# Patient Record
Sex: Male | Born: 1945 | Race: White | Hispanic: No | Marital: Married | Smoking: Former smoker
Health system: Southern US, Community
[De-identification: ages and names within clinical notes are randomized; demographics above are authoritative.]

## PROBLEM LIST (undated history)

## (undated) DIAGNOSIS — J811 Chronic pulmonary edema: Secondary | ICD-10-CM

## (undated) DIAGNOSIS — K279 Peptic ulcer, site unspecified, unspecified as acute or chronic, without hemorrhage or perforation: Secondary | ICD-10-CM

## (undated) DIAGNOSIS — S069X9A Unspecified intracranial injury with loss of consciousness of unspecified duration, initial encounter: Secondary | ICD-10-CM

## (undated) DIAGNOSIS — R531 Weakness: Secondary | ICD-10-CM

## (undated) DIAGNOSIS — K219 Gastro-esophageal reflux disease without esophagitis: Secondary | ICD-10-CM

## (undated) DIAGNOSIS — S32019A Unspecified fracture of first lumbar vertebra, initial encounter for closed fracture: Secondary | ICD-10-CM

## (undated) DIAGNOSIS — J189 Pneumonia, unspecified organism: Secondary | ICD-10-CM

## (undated) DIAGNOSIS — C801 Malignant (primary) neoplasm, unspecified: Secondary | ICD-10-CM

## (undated) DIAGNOSIS — M549 Dorsalgia, unspecified: Secondary | ICD-10-CM

## (undated) DIAGNOSIS — A4159 Other Gram-negative sepsis: Secondary | ICD-10-CM

## (undated) DIAGNOSIS — S3992XA Unspecified injury of lower back, initial encounter: Secondary | ICD-10-CM

## (undated) DIAGNOSIS — B1920 Unspecified viral hepatitis C without hepatic coma: Secondary | ICD-10-CM

## (undated) DIAGNOSIS — R159 Full incontinence of feces: Secondary | ICD-10-CM

## (undated) DIAGNOSIS — R569 Unspecified convulsions: Secondary | ICD-10-CM

## (undated) DIAGNOSIS — J449 Chronic obstructive pulmonary disease, unspecified: Secondary | ICD-10-CM

## (undated) HISTORY — DX: Malignant (primary) neoplasm, unspecified: C80.1

## (undated) HISTORY — DX: Gastro-esophageal reflux disease without esophagitis: K21.9

## (undated) HISTORY — DX: Unspecified fracture of first lumbar vertebra, initial encounter for closed fracture: S32.019A

## (undated) HISTORY — DX: Chronic pulmonary edema: J81.1

## (undated) HISTORY — DX: Peptic ulcer, site unspecified, unspecified as acute or chronic, without hemorrhage or perforation: K27.9

## (undated) HISTORY — PX: FRACTURE SURGERY: SHX138

## (undated) HISTORY — PX: BACK SURGERY: SHX140

## (undated) HISTORY — PX: BRAIN SURGERY: SHX531

## (undated) HISTORY — DX: Unspecified viral hepatitis C without hepatic coma: B19.20

## (undated) HISTORY — PX: EYE SURGERY: SHX253

## (undated) HISTORY — DX: Weakness: R53.1

---

## 1990-01-09 DIAGNOSIS — S069XAA Unspecified intracranial injury with loss of consciousness status unknown, initial encounter: Secondary | ICD-10-CM

## 1990-01-09 DIAGNOSIS — S069X9A Unspecified intracranial injury with loss of consciousness of unspecified duration, initial encounter: Secondary | ICD-10-CM

## 1990-01-09 HISTORY — DX: Unspecified intracranial injury with loss of consciousness of unspecified duration, initial encounter: S06.9X9A

## 1990-01-09 HISTORY — DX: Unspecified intracranial injury with loss of consciousness status unknown, initial encounter: S06.9XAA

## 1997-08-03 ENCOUNTER — Other Ambulatory Visit: Admission: RE | Admit: 1997-08-03 | Discharge: 1997-08-03 | Payer: Self-pay | Admitting: Gastroenterology

## 1997-08-30 ENCOUNTER — Other Ambulatory Visit: Admission: RE | Admit: 1997-08-30 | Discharge: 1997-08-30 | Payer: Self-pay | Admitting: Gastroenterology

## 1997-09-10 ENCOUNTER — Other Ambulatory Visit: Admission: RE | Admit: 1997-09-10 | Discharge: 1997-09-10 | Payer: Self-pay | Admitting: Gastroenterology

## 1997-09-28 ENCOUNTER — Other Ambulatory Visit: Admission: RE | Admit: 1997-09-28 | Discharge: 1997-09-28 | Payer: Self-pay | Admitting: Gastroenterology

## 1997-10-10 ENCOUNTER — Other Ambulatory Visit: Admission: RE | Admit: 1997-10-10 | Discharge: 1997-10-10 | Payer: Self-pay | Admitting: Gastroenterology

## 1997-11-05 ENCOUNTER — Other Ambulatory Visit: Admission: RE | Admit: 1997-11-05 | Discharge: 1997-11-05 | Payer: Self-pay | Admitting: Internal Medicine

## 1997-11-19 ENCOUNTER — Other Ambulatory Visit: Admission: RE | Admit: 1997-11-19 | Discharge: 1997-11-19 | Payer: Self-pay | Admitting: Gastroenterology

## 1997-11-27 ENCOUNTER — Encounter: Admission: RE | Admit: 1997-11-27 | Discharge: 1997-11-27 | Payer: Self-pay | Admitting: Internal Medicine

## 1997-12-03 ENCOUNTER — Encounter: Admission: RE | Admit: 1997-12-03 | Discharge: 1997-12-03 | Payer: Self-pay | Admitting: Internal Medicine

## 1997-12-17 ENCOUNTER — Emergency Department (HOSPITAL_COMMUNITY): Admission: EM | Admit: 1997-12-17 | Discharge: 1997-12-17 | Payer: Self-pay | Admitting: Emergency Medicine

## 1997-12-19 ENCOUNTER — Encounter: Payer: Self-pay | Admitting: Emergency Medicine

## 1997-12-19 ENCOUNTER — Inpatient Hospital Stay (HOSPITAL_COMMUNITY): Admission: EM | Admit: 1997-12-19 | Discharge: 1997-12-26 | Payer: Self-pay | Admitting: Emergency Medicine

## 1998-02-04 ENCOUNTER — Encounter: Admission: RE | Admit: 1998-02-04 | Discharge: 1998-02-04 | Payer: Self-pay | Admitting: Internal Medicine

## 1998-03-27 ENCOUNTER — Encounter: Admission: RE | Admit: 1998-03-27 | Discharge: 1998-03-27 | Payer: Self-pay | Admitting: Internal Medicine

## 1999-03-12 ENCOUNTER — Encounter: Admission: RE | Admit: 1999-03-12 | Discharge: 1999-03-12 | Payer: Self-pay | Admitting: Internal Medicine

## 1999-03-28 ENCOUNTER — Encounter: Admission: RE | Admit: 1999-03-28 | Discharge: 1999-03-28 | Payer: Self-pay | Admitting: Internal Medicine

## 1999-03-31 ENCOUNTER — Encounter: Admission: RE | Admit: 1999-03-31 | Discharge: 1999-03-31 | Payer: Self-pay | Admitting: Internal Medicine

## 1999-03-31 ENCOUNTER — Ambulatory Visit (HOSPITAL_COMMUNITY): Admission: RE | Admit: 1999-03-31 | Discharge: 1999-03-31 | Payer: Self-pay | Admitting: Internal Medicine

## 1999-03-31 ENCOUNTER — Encounter: Payer: Self-pay | Admitting: Internal Medicine

## 1999-04-10 ENCOUNTER — Encounter: Payer: Self-pay | Admitting: Internal Medicine

## 1999-04-10 ENCOUNTER — Emergency Department (HOSPITAL_COMMUNITY): Admission: EM | Admit: 1999-04-10 | Discharge: 1999-04-10 | Payer: Self-pay | Admitting: Emergency Medicine

## 1999-04-10 ENCOUNTER — Encounter: Payer: Self-pay | Admitting: Emergency Medicine

## 1999-04-14 ENCOUNTER — Encounter: Admission: RE | Admit: 1999-04-14 | Discharge: 1999-04-14 | Payer: Self-pay | Admitting: Internal Medicine

## 1999-04-14 ENCOUNTER — Ambulatory Visit (HOSPITAL_COMMUNITY): Admission: RE | Admit: 1999-04-14 | Discharge: 1999-04-14 | Payer: Self-pay | Admitting: Internal Medicine

## 1999-04-23 ENCOUNTER — Encounter: Admission: RE | Admit: 1999-04-23 | Discharge: 1999-04-23 | Payer: Self-pay | Admitting: Hematology and Oncology

## 1999-07-10 ENCOUNTER — Encounter: Admission: RE | Admit: 1999-07-10 | Discharge: 1999-07-10 | Payer: Self-pay | Admitting: Internal Medicine

## 1999-08-13 ENCOUNTER — Encounter: Payer: Self-pay | Admitting: Internal Medicine

## 1999-08-13 ENCOUNTER — Encounter: Admission: RE | Admit: 1999-08-13 | Discharge: 1999-08-13 | Payer: Self-pay | Admitting: Internal Medicine

## 1999-08-13 ENCOUNTER — Ambulatory Visit (HOSPITAL_COMMUNITY): Admission: RE | Admit: 1999-08-13 | Discharge: 1999-08-13 | Payer: Self-pay | Admitting: Internal Medicine

## 1999-08-20 ENCOUNTER — Encounter: Payer: Self-pay | Admitting: Emergency Medicine

## 1999-08-20 ENCOUNTER — Encounter: Payer: Self-pay | Admitting: Internal Medicine

## 1999-08-20 ENCOUNTER — Encounter (INDEPENDENT_AMBULATORY_CARE_PROVIDER_SITE_OTHER): Payer: Self-pay | Admitting: *Deleted

## 1999-08-20 ENCOUNTER — Inpatient Hospital Stay (HOSPITAL_COMMUNITY): Admission: EM | Admit: 1999-08-20 | Discharge: 1999-09-02 | Payer: Self-pay | Admitting: Emergency Medicine

## 1999-08-21 ENCOUNTER — Encounter: Payer: Self-pay | Admitting: Internal Medicine

## 1999-08-22 ENCOUNTER — Encounter: Payer: Self-pay | Admitting: Internal Medicine

## 1999-08-23 ENCOUNTER — Encounter: Payer: Self-pay | Admitting: Internal Medicine

## 1999-08-25 ENCOUNTER — Encounter: Payer: Self-pay | Admitting: Internal Medicine

## 1999-09-02 ENCOUNTER — Inpatient Hospital Stay (HOSPITAL_COMMUNITY)
Admission: RE | Admit: 1999-09-02 | Discharge: 1999-09-05 | Payer: Self-pay | Admitting: Physical Medicine & Rehabilitation

## 1999-11-28 ENCOUNTER — Encounter: Admission: RE | Admit: 1999-11-28 | Discharge: 1999-11-28 | Payer: Self-pay | Admitting: Internal Medicine

## 1999-12-10 ENCOUNTER — Ambulatory Visit (HOSPITAL_COMMUNITY): Admission: RE | Admit: 1999-12-10 | Discharge: 1999-12-10 | Payer: Self-pay | Admitting: *Deleted

## 2000-02-26 ENCOUNTER — Encounter: Admission: RE | Admit: 2000-02-26 | Discharge: 2000-02-26 | Payer: Self-pay | Admitting: Hematology and Oncology

## 2000-02-27 ENCOUNTER — Ambulatory Visit (HOSPITAL_COMMUNITY): Admission: RE | Admit: 2000-02-27 | Discharge: 2000-02-27 | Payer: Self-pay | Admitting: Internal Medicine

## 2000-02-27 ENCOUNTER — Encounter: Payer: Self-pay | Admitting: Internal Medicine

## 2000-03-09 ENCOUNTER — Encounter: Admission: RE | Admit: 2000-03-09 | Discharge: 2000-03-09 | Payer: Self-pay | Admitting: Hematology and Oncology

## 2000-04-09 ENCOUNTER — Encounter: Admission: RE | Admit: 2000-04-09 | Discharge: 2000-04-09 | Payer: Self-pay | Admitting: Internal Medicine

## 2000-04-14 ENCOUNTER — Ambulatory Visit (HOSPITAL_COMMUNITY): Admission: RE | Admit: 2000-04-14 | Discharge: 2000-04-14 | Payer: Self-pay | Admitting: *Deleted

## 2000-10-15 ENCOUNTER — Encounter: Payer: Self-pay | Admitting: Emergency Medicine

## 2000-10-15 ENCOUNTER — Inpatient Hospital Stay (HOSPITAL_COMMUNITY): Admission: EM | Admit: 2000-10-15 | Discharge: 2000-10-22 | Payer: Self-pay

## 2000-10-15 ENCOUNTER — Emergency Department (HOSPITAL_COMMUNITY): Admission: EM | Admit: 2000-10-15 | Discharge: 2000-10-16 | Payer: Self-pay | Admitting: Emergency Medicine

## 2000-10-16 ENCOUNTER — Encounter: Payer: Self-pay | Admitting: Internal Medicine

## 2000-10-17 ENCOUNTER — Encounter: Payer: Self-pay | Admitting: Internal Medicine

## 2000-10-19 ENCOUNTER — Encounter: Payer: Self-pay | Admitting: Internal Medicine

## 2000-10-25 ENCOUNTER — Inpatient Hospital Stay (HOSPITAL_COMMUNITY): Admission: EM | Admit: 2000-10-25 | Discharge: 2000-10-28 | Payer: Self-pay

## 2000-10-26 ENCOUNTER — Encounter: Payer: Self-pay | Admitting: Internal Medicine

## 2000-12-13 ENCOUNTER — Encounter: Admission: RE | Admit: 2000-12-13 | Discharge: 2000-12-13 | Payer: Self-pay | Admitting: Internal Medicine

## 2001-07-22 ENCOUNTER — Encounter: Admission: RE | Admit: 2001-07-22 | Discharge: 2001-07-22 | Payer: Self-pay | Admitting: Internal Medicine

## 2005-08-26 ENCOUNTER — Encounter: Payer: Self-pay | Admitting: Emergency Medicine

## 2005-10-01 ENCOUNTER — Inpatient Hospital Stay (HOSPITAL_COMMUNITY): Admission: EM | Admit: 2005-10-01 | Discharge: 2005-10-06 | Payer: Self-pay | Admitting: *Deleted

## 2005-10-26 ENCOUNTER — Ambulatory Visit: Payer: Self-pay | Admitting: Internal Medicine

## 2005-11-11 ENCOUNTER — Ambulatory Visit (HOSPITAL_COMMUNITY): Admission: RE | Admit: 2005-11-11 | Discharge: 2005-11-11 | Payer: Self-pay | Admitting: Internal Medicine

## 2005-11-11 ENCOUNTER — Ambulatory Visit: Payer: Self-pay | Admitting: Cardiovascular Disease

## 2005-11-11 ENCOUNTER — Encounter: Payer: Self-pay | Admitting: Cardiovascular Disease

## 2005-11-17 ENCOUNTER — Ambulatory Visit (HOSPITAL_COMMUNITY): Admission: RE | Admit: 2005-11-17 | Discharge: 2005-11-17 | Payer: Self-pay | Admitting: Internal Medicine

## 2005-11-17 ENCOUNTER — Ambulatory Visit: Payer: Self-pay | Admitting: Internal Medicine

## 2005-11-30 ENCOUNTER — Ambulatory Visit: Payer: Self-pay | Admitting: Internal Medicine

## 2005-12-25 ENCOUNTER — Ambulatory Visit (HOSPITAL_COMMUNITY): Admission: RE | Admit: 2005-12-25 | Discharge: 2005-12-25 | Payer: Self-pay | Admitting: Internal Medicine

## 2006-03-10 ENCOUNTER — Inpatient Hospital Stay (HOSPITAL_COMMUNITY): Admission: AD | Admit: 2006-03-10 | Discharge: 2006-03-12 | Payer: Self-pay | Admitting: Gastroenterology

## 2006-06-08 ENCOUNTER — Encounter (INDEPENDENT_AMBULATORY_CARE_PROVIDER_SITE_OTHER): Payer: Self-pay | Admitting: *Deleted

## 2006-06-14 ENCOUNTER — Encounter (INDEPENDENT_AMBULATORY_CARE_PROVIDER_SITE_OTHER): Payer: Self-pay | Admitting: *Deleted

## 2006-06-17 ENCOUNTER — Telehealth: Payer: Self-pay | Admitting: *Deleted

## 2006-06-21 ENCOUNTER — Telehealth: Payer: Self-pay | Admitting: *Deleted

## 2006-06-28 ENCOUNTER — Ambulatory Visit: Payer: Self-pay | Admitting: Internal Medicine

## 2006-08-06 ENCOUNTER — Encounter (INDEPENDENT_AMBULATORY_CARE_PROVIDER_SITE_OTHER): Payer: Self-pay | Admitting: Internal Medicine

## 2006-08-10 ENCOUNTER — Ambulatory Visit: Payer: Self-pay | Admitting: Hospitalist

## 2006-08-10 ENCOUNTER — Telehealth: Payer: Self-pay | Admitting: *Deleted

## 2006-08-10 DIAGNOSIS — B1710 Acute hepatitis C without hepatic coma: Secondary | ICD-10-CM | POA: Insufficient documentation

## 2006-08-10 DIAGNOSIS — K279 Peptic ulcer, site unspecified, unspecified as acute or chronic, without hemorrhage or perforation: Secondary | ICD-10-CM | POA: Insufficient documentation

## 2006-08-10 DIAGNOSIS — R32 Unspecified urinary incontinence: Secondary | ICD-10-CM | POA: Insufficient documentation

## 2006-10-21 ENCOUNTER — Encounter (INDEPENDENT_AMBULATORY_CARE_PROVIDER_SITE_OTHER): Payer: Self-pay | Admitting: *Deleted

## 2006-11-03 ENCOUNTER — Encounter (INDEPENDENT_AMBULATORY_CARE_PROVIDER_SITE_OTHER): Payer: Self-pay | Admitting: *Deleted

## 2006-11-15 ENCOUNTER — Ambulatory Visit: Payer: Self-pay | Admitting: *Deleted

## 2006-11-26 ENCOUNTER — Ambulatory Visit: Payer: Self-pay | Admitting: *Deleted

## 2006-11-26 ENCOUNTER — Encounter (INDEPENDENT_AMBULATORY_CARE_PROVIDER_SITE_OTHER): Payer: Self-pay | Admitting: Internal Medicine

## 2006-11-26 DIAGNOSIS — H9190 Unspecified hearing loss, unspecified ear: Secondary | ICD-10-CM | POA: Insufficient documentation

## 2006-11-26 DIAGNOSIS — R4701 Aphasia: Secondary | ICD-10-CM | POA: Insufficient documentation

## 2006-11-26 LAB — CONVERTED CEMR LAB
AST: 27 units/L (ref 0–37)
Albumin: 4.3 g/dL (ref 3.5–5.2)
BUN: 18 mg/dL (ref 6–23)
CO2: 24 meq/L (ref 19–32)
Calcium: 9.3 mg/dL (ref 8.4–10.5)
Chloride: 109 meq/L (ref 96–112)
Creatinine, Ser: 0.96 mg/dL (ref 0.40–1.50)
Glucose, Bld: 97 mg/dL (ref 70–99)
Potassium: 4.6 meq/L (ref 3.5–5.3)

## 2006-12-08 ENCOUNTER — Encounter (INDEPENDENT_AMBULATORY_CARE_PROVIDER_SITE_OTHER): Payer: Self-pay | Admitting: *Deleted

## 2006-12-08 ENCOUNTER — Ambulatory Visit: Payer: Self-pay | Admitting: Vascular Surgery

## 2006-12-08 ENCOUNTER — Ambulatory Visit: Payer: Self-pay | Admitting: Cardiology

## 2006-12-08 ENCOUNTER — Ambulatory Visit (HOSPITAL_COMMUNITY): Admission: RE | Admit: 2006-12-08 | Discharge: 2006-12-08 | Payer: Self-pay | Admitting: *Deleted

## 2006-12-15 ENCOUNTER — Telehealth (INDEPENDENT_AMBULATORY_CARE_PROVIDER_SITE_OTHER): Payer: Self-pay | Admitting: Internal Medicine

## 2006-12-20 ENCOUNTER — Telehealth (INDEPENDENT_AMBULATORY_CARE_PROVIDER_SITE_OTHER): Payer: Self-pay | Admitting: Internal Medicine

## 2006-12-22 ENCOUNTER — Encounter (INDEPENDENT_AMBULATORY_CARE_PROVIDER_SITE_OTHER): Payer: Self-pay | Admitting: *Deleted

## 2006-12-30 ENCOUNTER — Telehealth: Payer: Self-pay | Admitting: *Deleted

## 2006-12-31 ENCOUNTER — Observation Stay (HOSPITAL_COMMUNITY): Admission: EM | Admit: 2006-12-31 | Discharge: 2007-01-07 | Payer: Self-pay | Admitting: Infectious Diseases

## 2006-12-31 ENCOUNTER — Encounter: Payer: Self-pay | Admitting: Emergency Medicine

## 2006-12-31 ENCOUNTER — Ambulatory Visit: Payer: Self-pay | Admitting: Infectious Diseases

## 2006-12-31 ENCOUNTER — Encounter (INDEPENDENT_AMBULATORY_CARE_PROVIDER_SITE_OTHER): Payer: Self-pay | Admitting: *Deleted

## 2007-01-05 ENCOUNTER — Encounter (INDEPENDENT_AMBULATORY_CARE_PROVIDER_SITE_OTHER): Payer: Self-pay | Admitting: *Deleted

## 2007-01-11 ENCOUNTER — Telehealth (INDEPENDENT_AMBULATORY_CARE_PROVIDER_SITE_OTHER): Payer: Self-pay | Admitting: *Deleted

## 2007-03-07 ENCOUNTER — Ambulatory Visit (HOSPITAL_COMMUNITY): Admission: RE | Admit: 2007-03-07 | Discharge: 2007-03-07 | Payer: Self-pay | Admitting: Internal Medicine

## 2008-11-09 ENCOUNTER — Ambulatory Visit: Payer: Self-pay | Admitting: Internal Medicine

## 2008-11-09 ENCOUNTER — Encounter (INDEPENDENT_AMBULATORY_CARE_PROVIDER_SITE_OTHER): Payer: Self-pay | Admitting: Internal Medicine

## 2008-11-09 LAB — CONVERTED CEMR LAB
ALT: 15 units/L (ref 0–53)
AST: 26 units/L (ref 0–37)
CO2: 25 meq/L (ref 19–32)
Calcium: 9.4 mg/dL (ref 8.4–10.5)
Chloride: 110 meq/L (ref 96–112)
Cholesterol: 171 mg/dL (ref 0–200)
PSA: 0.29 ng/mL (ref 0.10–4.00)
Potassium: 4.6 meq/L (ref 3.5–5.3)
Sodium: 145 meq/L (ref 135–145)
Total CHOL/HDL Ratio: 5.5
Total Protein: 7.3 g/dL (ref 6.0–8.3)

## 2008-11-12 ENCOUNTER — Encounter (INDEPENDENT_AMBULATORY_CARE_PROVIDER_SITE_OTHER): Payer: Self-pay | Admitting: Internal Medicine

## 2009-06-17 ENCOUNTER — Ambulatory Visit: Payer: Self-pay | Admitting: Internal Medicine

## 2009-06-17 DIAGNOSIS — Z87820 Personal history of traumatic brain injury: Secondary | ICD-10-CM | POA: Insufficient documentation

## 2009-07-11 ENCOUNTER — Encounter (INDEPENDENT_AMBULATORY_CARE_PROVIDER_SITE_OTHER): Payer: Self-pay | Admitting: Internal Medicine

## 2009-08-07 ENCOUNTER — Telehealth (INDEPENDENT_AMBULATORY_CARE_PROVIDER_SITE_OTHER): Payer: Self-pay | Admitting: Internal Medicine

## 2009-08-09 ENCOUNTER — Telehealth (INDEPENDENT_AMBULATORY_CARE_PROVIDER_SITE_OTHER): Payer: Self-pay | Admitting: Internal Medicine

## 2009-09-26 ENCOUNTER — Telehealth (INDEPENDENT_AMBULATORY_CARE_PROVIDER_SITE_OTHER): Payer: Self-pay | Admitting: Internal Medicine

## 2009-10-18 ENCOUNTER — Ambulatory Visit: Payer: Self-pay | Admitting: Internal Medicine

## 2009-10-18 ENCOUNTER — Encounter: Payer: Self-pay | Admitting: Internal Medicine

## 2009-10-18 ENCOUNTER — Inpatient Hospital Stay (HOSPITAL_COMMUNITY): Admission: EM | Admit: 2009-10-18 | Discharge: 2009-11-04 | Payer: Self-pay | Admitting: Emergency Medicine

## 2009-10-19 ENCOUNTER — Encounter (INDEPENDENT_AMBULATORY_CARE_PROVIDER_SITE_OTHER): Payer: Self-pay | Admitting: Emergency Medicine

## 2009-10-19 ENCOUNTER — Encounter: Payer: Self-pay | Admitting: Internal Medicine

## 2009-10-19 DIAGNOSIS — I498 Other specified cardiac arrhythmias: Secondary | ICD-10-CM | POA: Insufficient documentation

## 2009-10-24 ENCOUNTER — Ambulatory Visit: Payer: Self-pay | Admitting: Physical Medicine & Rehabilitation

## 2009-11-13 ENCOUNTER — Ambulatory Visit: Payer: Self-pay | Admitting: Internal Medicine

## 2009-11-13 DIAGNOSIS — F322 Major depressive disorder, single episode, severe without psychotic features: Secondary | ICD-10-CM | POA: Insufficient documentation

## 2009-12-23 ENCOUNTER — Ambulatory Visit: Payer: Self-pay | Admitting: Internal Medicine

## 2009-12-23 LAB — CONVERTED CEMR LAB
ALT: 19 units/L (ref 0–53)
AST: 36 units/L (ref 0–37)
CO2: 26 meq/L (ref 19–32)
Calcium: 9.3 mg/dL (ref 8.4–10.5)
Chloride: 104 meq/L (ref 96–112)
Creatinine, Ser: 0.85 mg/dL (ref 0.40–1.50)
Sodium: 140 meq/L (ref 135–145)
Total Bilirubin: 0.6 mg/dL (ref 0.3–1.2)
Total Protein: 7.2 g/dL (ref 6.0–8.3)

## 2010-04-07 ENCOUNTER — Ambulatory Visit: Payer: Self-pay | Admitting: Internal Medicine

## 2010-04-07 DIAGNOSIS — E785 Hyperlipidemia, unspecified: Secondary | ICD-10-CM | POA: Insufficient documentation

## 2010-04-07 LAB — CONVERTED CEMR LAB

## 2010-04-08 ENCOUNTER — Encounter: Payer: Self-pay | Admitting: Internal Medicine
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
## 2010-05-27 NOTE — Progress Notes (Signed)
Summary: Refill/gh  Phone Note Refill Request Message from:  Fax from Pharmacy on August 07, 2009 10:34 AM  Refills Requested: Medication #1:  CYMBALTA 30 MG  CPEP Take 1 capsule by mouth once daily.  Medication #2:  PRILOSEC 20 MG CPDR Take 1 tablet by mouth once a day  Medication #3:  TRAMADOL HCL 50 MG TABS 1 tab every 4 hrs as needed for pain  Medication #4:  DESONIDE 0.05 %  CREA Apply to affected area every night at bedtime. Wants Nexium if possible   Method Requested: Electronic Initial call taken by: Angelina Ok RN,  August 07, 2009 10:34 AM    New/Updated Medications: NEXIUM 20 MG CPDR (ESOMEPRAZOLE MAGNESIUM) Take 1 tablet by mouth once a day Prescriptions: DESONIDE 0.05 %  CREA (DESONIDE) Apply to affected area every night at bedtime.  #1 x 3   Entered and Authorized by:   Zara Council MD   Signed by:   Zara Council MD on 08/07/2009   Method used:   Electronically to        [facility]. [phone]* (retail)       [address]       [place], [place]  [address]       Ph: [phone]       Fax: [phone]   RxID:   [id number] CYMBALTA 30 MG  CPEP (DULOXETINE HCL) Take 1 capsule by mouth once daily.  #30 x 5   Entered and Authorized by:   Zara Council MD   Signed by:   Zara Council MD on 08/07/2009   Method used:   Electronically to        [facility]. [phone]* (retail)       [address]       [place], [place]  [address]       Ph: [phone]       Fax: [phone]   RxID:   [id number] TRAMADOL HCL 50 MG TABS (TRAMADOL HCL) 1 tab every 4 hrs as needed for pain  #120 x 1   Entered and Authorized by:   Zara Council MD   Signed by:   Zara Council MD on 08/07/2009   Method used:   Electronically to        [facility]. [phone]* (retail)       [address]       [place], [place]  [address]       Ph: [phone]       Fax:  [phone]   RxID:   [id number] NEXIUM 20 MG CPDR (ESOMEPRAZOLE MAGNESIUM) Take 1 tablet by mouth once a day  #30 x 5   Entered and Authorized by:   Zara Council MD   Signed by:   Zara Council MD on 08/07/2009   Method used:   Electronically to        [facility]. [phone]* (retail)       [address]       [place], [place]  [address]       Ph: [phone]       Fax: [phone]   RxID:   [id number]

## 2010-05-27 NOTE — Progress Notes (Signed)
Summary: Supplies  Phone Note Call from Patient   Caller: Spouse Call For: Todd Council MD Summary of Call: Pt needs prescription for incontinence supplies.  120 pull-ups and 120 diapers.  Wants the prescription mailed to her home if possible. Angelina Ok RN  September 26, 2009 1:43 PM  Initial call taken by: Angelina Ok RN,  September 26, 2009 1:43 PM    New/Updated Medications: * PULL-UPS Please provide 120 pull-ups for patient's incontinence. * DIAPER Please provide 120 diaper for use for patient's incontinence. Prescriptions: DIAPER Please provide 120 diaper for use for patient's incontinence.  #120 x 3   Entered and Authorized by:   Todd Council MD   Signed by:   Todd Council MD on 10/01/2009   Method used:   Telephoned to ...       [facility]. [phone]* (retail)       [address]       [place], [place]  [address]       Ph: [phone]       Fax: [phone]   RxID:   [id number] PULL-UPS Please provide 120 pull-ups for patient's incontinence.  #120 x 3   Entered and Authorized by:   Todd Council MD   Signed by:   Todd Council MD on 10/01/2009   Method used:   Telephoned to ...       [facility]. [phone]* (retail)       [address]       [place], [place]  [address]       Ph: [phone]       Fax: [phone]   RxID:   [id number]   Appended Document: Supplies Prrescription for was faxed to pt'snhome address per Sherry's request.  Angelina Ok, RN 10/04/2009.

## 2010-05-27 NOTE — Assessment & Plan Note (Signed)
Summary: ACUTE-COUGHING/UNABLE TO CLEAR/ADD PER GLADYS/CFB(SILWAL)   Vital Signs:  Patient profile:   65 year old male Height:      72 inches Weight:      214.2 pounds BMI:     29.16 Temp:     96.7 degrees F oral Pulse rate:   90 / minute BP sitting:   128 / 86  (right arm)  Vitals Entered By: Filomena Jungling NT II (June 17, 2009 1:39 PM) CC: conjestion  Is Patient Diabetic? No Pain Assessment Patient in pain? no       Does patient need assistance? Ambulation Wheelchair Comments lives in assistant   Primary Care Provider:  Zara Council MD  CC:  conjestion .  History of Present Illness: 65 y/o man living in ALF with PMH as per the EMR comes to the clinic feeling congested and having cough since last 5 days, He denies fevers, chills or SOB. He has been coughing most of the day, is not able to bring out the cough but feels he has some congestion in his throat and face. He is also having pain while swallowing. No sick contacts or rash or mailaise.    Preventive Screening-Counseling & Management  Alcohol-Tobacco     Smoking Status: quit     Year Quit: 1991  Caffeine-Diet-Exercise     Does Patient Exercise: no  Current Medications (verified): 1)  Diazepam 10 Mg Tabs (Diazepam) .... Take 1 Tablet By Mouth Bedtime 2)  Advair Diskus 250-50 Mcg/dose Misc (Fluticasone-Salmeterol) .... One Puff Twice Daily 3)  Prilosec 20 Mg Cpdr (Omeprazole) .... Take 1 Tablet By Mouth Once A Day 4)  Anacin 81 Mg  Tbec (Aspirin) .... Take 1 Tablet By Mouth Once A Day 5)  Cymbalta 30 Mg  Cpep (Duloxetine Hcl) .... Take 1 Capsule By Mouth Once Daily. 6)  Mucinex Dm 30-600 Mg  Tb12 (Dextromethorphan-Guaifenesin) .... Take 1 Tablet By Mouth Every 12 Hours. 7)  Desonide 0.05 %  Crea (Desonide) .... Apply To Affected Area Every Night At Bedtime. 8)  Colace 100 Mg Caps (Docusate Sodium) .... Take 1 Tablet By Mouth Two Times A Day 9)  Vicodin 5-500 Mg Tabs (Hydrocodone-Acetaminophen) .... Per  Nursing Home Physician 10)  Imitrex 50 Mg Tabs (Sumatriptan Succinate) .... Take One Tablet With Headache, If Using More Than Once Weekly Contact Doctor. 11)  Dandruff Shampoo 1 % Lotn (Selenium Sulfide) .... Use Whenever You Take Shower.  Allergies (verified): 1)  ! Codeine  Review of Systems       The patient complains of hoarseness and prolonged cough.  The patient denies anorexia, fever, weight loss, weight gain, vision loss, decreased hearing, chest pain, syncope, dyspnea on exertion, peripheral edema, headaches, hemoptysis, abdominal pain, melena, hematochezia, severe indigestion/heartburn, hematuria, incontinence, genital sores, muscle weakness, suspicious skin lesions, transient blindness, difficulty walking, depression, unusual weight change, abnormal bleeding, enlarged lymph nodes, angioedema, breast masses, and testicular masses.    Physical Exam  General:  alert, well-developed, well-nourished, and well-hydrated.   Head:  normocephalic and atraumatic.   Ears:  R ear normal and L ear normal.   Nose:  no external deformity.   Lungs:  normal respiratory effort and coarse breath sounds b/lnormal respiratory effort, normal breath sounds, no dullness, and no wheezes.   Heart:  normal rate, regular rhythm, and no JVD.   Abdomen:  soft, non-tender, normal bowel sounds, and no masses.   Pulses:  dorsalis pedis pulses normal bilaterally  Extremities:  no edema Neurologic:  OrientedX3,  cranial nerver 2-12 intact,strength good in all extremities, sensations normal to light touch, reflexes 2+ b/l, gait normal    Impression & Recommendations:  Problem # 1:  ACUTE BRONCHITIS (ICD-466.0) Will give z-pack. will ask him to take mucinex. Paitient's wife wants symbicort instead of advair. His updated medication list for this problem includes:    Symbicort 80-4.5 Mcg/act Aero (Budesonide-formoterol fumarate) .Marland Kitchen... 2 pufs twice daily    Mucinex Dm 30-600 Mg Tb12 (Dextromethorphan-guaifenesin)  .Marland Kitchen... Take 1 tablet by mouth every 12 hours.    Azithromycin 250 Mg Tabs (Azithromycin) .Marland Kitchen... 2 tabs today. then 1 tab a day for next 3 days  Problem # 2:  HEADACHE, TENSION (ICD-307.81) Patient'swife wants to discontinue vicodin for her husband as he gets consitpated with vicodin. Will not refill vicodin. Will ask it to be given every 12 hrs instead of current q4h. Will discontinue from medication list at next visit if he is not getting it anymore. Also, will add ultram for pain control  His updated medication list for this problem includes:    Anacin 81 Mg Tbec (Aspirin) .Marland Kitchen... Take 1 tablet by mouth once a day    Vicodin 5-500 Mg Tabs (Hydrocodone-acetaminophen) .Marland Kitchen... 1 tab every 12 hrs as needed for pain    Imitrex 50 Mg Tabs (Sumatriptan succinate) .Marland Kitchen... Take one tablet with headache, if using more than once weekly contact doctor.    Tramadol Hcl 50 Mg Tabs (Tramadol hcl) .Marland Kitchen... 1 tab every 4 hrs as needed for pain  Complete Medication List: 1)  Diazepam 10 Mg Tabs (Diazepam) .... Take 1 tablet by mouth bedtime 2)  Symbicort 80-4.5 Mcg/act Aero (Budesonide-formoterol fumarate) .... 2 pufs twice daily 3)  Prilosec 20 Mg Cpdr (Omeprazole) .... Take 1 tablet by mouth once a day 4)  Anacin 81 Mg Tbec (Aspirin) .... Take 1 tablet by mouth once a day 5)  Cymbalta 30 Mg Cpep (Duloxetine hcl) .... Take 1 capsule by mouth once daily. 6)  Mucinex Dm 30-600 Mg Tb12 (Dextromethorphan-guaifenesin) .... Take 1 tablet by mouth every 12 hours. 7)  Desonide 0.05 % Crea (Desonide) .... Apply to affected area every night at bedtime. 8)  Colace 100 Mg Caps (Docusate sodium) .... Take 1 tablet by mouth two times a day 9)  Vicodin 5-500 Mg Tabs (Hydrocodone-acetaminophen) .Marland Kitchen.. 1 tab every 12 hrs as needed for pain 10)  Imitrex 50 Mg Tabs (Sumatriptan succinate) .... Take one tablet with headache, if using more than once weekly contact doctor. 11)  Dandruff Shampoo 1 % Lotn (Selenium sulfide) .... Use whenever you  take shower. 12)  Tramadol Hcl 50 Mg Tabs (Tramadol hcl) .Marland Kitchen.. 1 tab every 4 hrs as needed for pain 13)  Azithromycin 250 Mg Tabs (Azithromycin) .... 2 tabs today. then 1 tab a day for next 3 days  Patient Instructions: 1)  Follow up appointment as needed 2)  Get plenty of rest, drink lots of clear liquids, and use Return in 7-10 days if you're not better:sooner if you're feeling worse. Prescriptions: DESONIDE 0.05 %  CREA (DESONIDE) Apply to affected area every night at bedtime.  #1 x 3   Entered and Authorized by:   Bethel Born MD   Signed by:   Bethel Born MD on 06/17/2009   Method used:   Electronically to        [facility]. [phone]* (retail)       [address]       [facility]  [place], [place]  [address]       Ph: [phone]       Fax: [phone]   RxID:   [id number] AZITHROMYCIN 250 MG TABS (AZITHROMYCIN) 2 tabs today. Then 1 tab a day for next 3 days  #5 x 0   Entered and Authorized by:   Bethel Born MD   Signed by:   Bethel Born MD on 06/17/2009   Method used:   Electronically to        [facility]. [phone]* (retail)       [address]       [place], [place]  [address]       Ph: [phone]       Fax: [phone]   RxID:   [id number] SYMBICORT 80-4.5 MCG/ACT AERO (BUDESONIDE-FORMOTEROL FUMARATE) 2 pufs twice daily  #1 x 3   Entered and Authorized by:   Bethel Born MD   Signed by:   Bethel Born MD on 06/17/2009   Method used:   Electronically to        [facility]. [phone]* (retail)       [address]       [place], [place]  [address]       Ph: [phone]       Fax: [phone]   RxID:   [id number] TRAMADOL HCL 50 MG TABS (TRAMADOL HCL) 1 tab every 4 hrs as needed for pain  #120 x 1   Entered and Authorized by:   Bethel Born MD   Signed by:   Bethel Born MD on 06/17/2009   Method used:   Electronically to        [facility]. [phone]* (retail)       [address]       [place], [place]  [address]       Ph: [phone]       Fax: [phone]   RxID:   [id number]     Prevention & Chronic Care Immunizations   Influenza vaccine: Not documented   Influenza vaccine due: 12/27/2010    Tetanus booster: Not documented   Tetanus booster due: 06/18/2019    Pneumococcal vaccine: Not documented   Pneumococcal vaccine due: 06/17/2014    H. zoster vaccine: Not documented  Colorectal Screening   Hemoccult: Not documented   Hemoccult action/deferral: Deferred  (06/17/2009)    Colonoscopy: Not documented  Other Screening   PSA: 0.29  (11/09/2008)   Smoking status: quit  (06/17/2009)  Lipids   Total Cholesterol: 171  (11/09/2008)   LDL: 127  (11/09/2008)   LDL Direct: Not documented   HDL: 31  (11/09/2008)   Triglycerides: 67  (11/09/2008)
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
## 2010-05-27 NOTE — Progress Notes (Signed)
Summary: REfill/gh  Phone Note Refill Request Message from:  Patient on August 07, 2009 10:37 AM  Refills Requested: Medication #1:  Ventolin inhaler  Medication #2:  teridex/ 0.12% for his gums  Method Requested: Electronic Initial call taken by: Angelina Ok RN,  August 07, 2009 10:39 AM    New/Updated Medications: VENTOLIN HFA 108 (90 BASE) MCG/ACT AERS (ALBUTEROL SULFATE) 2 puff as needed for shortness of breath every 4 hours. PERIDEX 0.12 % SOLN (CHLORHEXIDINE GLUCONATE) Swish and spit 3 spoon (15ml) twice a day for one week. Prescriptions: PERIDEX 0.12 % SOLN (CHLORHEXIDINE GLUCONATE) Swish and spit 3 spoon (15ml) twice a day for one week.  #1 x 1   Entered and Authorized by:   Zara Council MD   Signed by:   Zara Council MD on 08/07/2009   Method used:   Electronically to        [facility]. [phone]* (retail)       [address]       [place], [place]  [address]       Ph: [phone]       Fax: [phone]   RxID:   [id number] VENTOLIN HFA 108 (90 BASE) MCG/ACT AERS (ALBUTEROL SULFATE) 2 puff as needed for shortness of breath every 4 hours.  #1 x 5   Entered and Authorized by:   Zara Council MD   Signed by:   Zara Council MD on 08/07/2009   Method used:   Electronically to        [facility]. [phone]* (retail)       [address]       [place], [place]  [address]       Ph: [phone]       Fax: [phone]   RxID:   [id number]

## 2010-05-27 NOTE — Progress Notes (Signed)
Summary: Medication  Phone Note Refill Request Message from:  Patient on August 09, 2009 3:21 PM  Refills Requested: Medication #1:  CYMBALTA 30 MG  CPEP Take 1 capsule by mouth once daily. Pt is taking Cynbalta 30 mg twice a day.  Pt's wife says that he has been taking the 30 mg twice  day.  Wants to know if he can take 60 mg 1 time a day .  Wife says that he has always been 60 mg  even at the Nursing.home.  Was not aware that the dosing had changed.   Method Requested: Electronic Initial call taken by: Angelina Ok RN,  August 09, 2009 3:25 PM    New/Updated Medications: CYMBALTA 60 MG CPEP (DULOXETINE HCL) Take 1 tablet by mouth once a day Prescriptions: CYMBALTA 60 MG CPEP (DULOXETINE HCL) Take 1 tablet by mouth once a day  #31 x 5   Entered and Authorized by:   Zara Council MD   Signed by:   Zara Council MD on 08/12/2009   Method used:   Electronically to        [facility]. [phone]* (retail)       [address]       [place], [place]  [address]       Ph: [phone]       Fax: [phone]   RxID:   [id number]
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
## 2010-05-27 NOTE — Discharge Summary (Signed)
Summary: Hospital Discharge Update (?seizure/syncope,)    Hospital Discharge Update:  Date of Admission: 10/18/2009 Date of Discharge: 10/22/2009  Brief Summary:  Syncope- cause ?seizure. EEG no new change, no anti epiliptic meds started. may need if repeated episode Elevated cardiac enz and transient q waves in inferior leads in setting of tachycardia- started on BB by cardiology Myoview positivie for exercise induced ischemia- medical Mx per family'n wishes- started plavix  Lab or other results pending at discharge:  none  Other follow-up issues:  HR- lopressor started during this admission for tachycardia(cause unknown).  Any further syncopal episode Outpatient cards follow up with Dr. Algie Coffer in 1 month set up      Problem list changes:  Added new problem of SINUS TACHYCARDIA (ICD-427.89) - Signed Added new problem of UTI (ICD-599.0) - Signed Added new problem of SYNCOPE (ICD-780.2) - Signed Removed problem of UTI (ICD-599.0) - Signed  Medication list changes:  Added new medication of METOPROLOL TARTRATE 25 MG TABS (METOPROLOL TARTRATE) Take 1 tablet by mouth two times a day - Signed Added new medication of CIPROFLOXACIN HCL 250 MG TABS (CIPROFLOXACIN HCL) Take 1 tablet by mouth two times a day for 2 days - Signed Removed medication of CIPROFLOXACIN HCL 250 MG TABS (CIPROFLOXACIN HCL) Take 1 tablet by mouth two times a day for 2 days - Signed Added new medication of PLAVIX 75 MG TABS (CLOPIDOGREL BISULFATE) Take 1 tablet by mouth once a day - Signed Rx of PLAVIX 75 MG TABS (CLOPIDOGREL BISULFATE) Take 1 tablet by mouth once a day;  #31 x 3;  Signed;  Entered by: Bethel Born MD;  Authorized by: Bethel Born MD;  Method used: Electronically to [facility]. #[id number]*, [address] [place], [place], [place]  [address], Ph: [phone], Fax: [phone]  The medication, problem, and allergy lists have been updated.  Please see the dictated discharge  summary for details.  Discharge medications:  DIAZEPAM 10 MG TABS (DIAZEPAM) Take 1 tablet by mouth bedtime SYMBICORT 80-4.5 MCG/ACT AERO (BUDESONIDE-FORMOTEROL FUMARATE) 2 pufs twice daily NEXIUM 20 MG CPDR (ESOMEPRAZOLE MAGNESIUM) Take 1 tablet by mouth once a day ANACIN 81 MG  TBEC (ASPIRIN) Take 1 tablet by mouth once a day CYMBALTA 60 MG CPEP (DULOXETINE HCL) Take 1 tablet by mouth once a day MUCINEX DM 30-600 MG  TB12 (DEXTROMETHORPHAN-GUAIFENESIN) Take 1 tablet by mouth every 12 hours. DESONIDE 0.05 %  CREA (DESONIDE) Apply to affected area every night at bedtime. COLACE 100 MG CAPS (DOCUSATE SODIUM) Take 1 tablet by mouth two times a day VICODIN 5-500 MG TABS (HYDROCODONE-ACETAMINOPHEN) 1 tab every 12 hrs as needed for pain IMITREX 50 MG TABS (SUMATRIPTAN SUCCINATE) Take one tablet with headache, if using more than once weekly contact doctor. DANDRUFF SHAMPOO 1 % LOTN (SELENIUM SULFIDE) Use whenever you take shower. TRAMADOL HCL 50 MG TABS (TRAMADOL HCL) 1 tab every 4 hrs as needed for pain VENTOLIN HFA 108 (90 BASE) MCG/ACT AERS (ALBUTEROL SULFATE) 2 puff as needed for shortness of breath every 4 hours. PERIDEX 0.12 % SOLN (CHLORHEXIDINE GLUCONATE) Swish and spit 3 spoon (15ml) twice a day for one week. * PULL-UPS Please provide 120 pull-ups for patient's incontinence. * DIAPER Please provide 120 diaper for use for patient's incontinence. METOPROLOL TARTRATE 25 MG TABS (METOPROLOL TARTRATE) Take 1 tablet by mouth two times a day PLAVIX 75 MG TABS (CLOPIDOGREL BISULFATE) Take 1 tablet by mouth once a day  Other patient instructions:  We will call you with an  appointment in outpatient clinic is 1 month.  You have an appointement with Dr. Algie Coffer (Cardiology) on 7/28 at 2 pm     Note: Hospital Discharge Medications & Other Instructions handout was printed, one copy for patient and a second copy to be placed in hospital chart.

## 2010-05-27 NOTE — Letter (Signed)
Summary: [place] [employer]: [employer]  [place] [employer]: [employer]   Imported By: Florinda Marker 07/17/2009 10:46:59  _____________________________________________________________________  External Attachment:    Type:   Image     Comment:   External Document
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
## 2010-06-23 ENCOUNTER — Other Ambulatory Visit: Payer: Self-pay | Admitting: Internal Medicine

## 2010-06-23 DIAGNOSIS — I10 Essential (primary) hypertension: Secondary | ICD-10-CM

## 2010-06-23 MED ORDER — METOPROLOL TARTRATE 25 MG PO TABS: 25.0000 mg | ORAL_TABLET | Freq: Two times a day (BID) | ORAL | Status: DC

## 2010-06-23 MED ORDER — CLOPIDOGREL BISULFATE 75 MG PO TABS: 75.0000 mg | ORAL_TABLET | Freq: Every day | ORAL | Status: DC

## 2010-06-23 MED ORDER — TRAMADOL HCL 50 MG PO TABS: 50.0000 mg | ORAL_TABLET | ORAL | Status: DC | PRN

## 2010-07-04 ENCOUNTER — Other Ambulatory Visit: Payer: Self-pay | Admitting: *Deleted

## 2010-07-04 MED ORDER — CHLORHEXIDINE GLUCONATE 0.12 % MT SOLN: 15.0000 mL | Freq: Two times a day (BID) | OROMUCOSAL | Status: DC

## 2010-07-04 NOTE — Telephone Encounter (Signed)
Call from pt's wife requesting a refill on pt's peridex mouth wash.  Pt's medication list is not complete in epic as of 07/04/10.  Prior rx in EMR was for peridex 0.12% swish and spit 15ml twice daily for one week. Will forward request to PCP.

## 2010-07-07 NOTE — Telephone Encounter (Signed)
noted 

## 2010-07-13 ENCOUNTER — Encounter: Payer: Self-pay | Admitting: Internal Medicine

## 2010-07-13 LAB — GLUCOSE, CAPILLARY

## 2010-07-14 LAB — URINALYSIS, ROUTINE W REFLEX MICROSCOPIC
Bilirubin Urine: NEGATIVE
Glucose, UA: NEGATIVE mg/dL
Hgb urine dipstick: NEGATIVE
Ketones, ur: >80 mg/dL — AB
pH: 6.5 (ref 5.0–8.0)

## 2010-07-14 LAB — URINE MICROSCOPIC-ADD ON

## 2010-07-14 LAB — DIFFERENTIAL
Basophils Absolute: 0 10*3/uL (ref 0.0–0.1)
Basophils Relative: 0 % (ref 0–1)
Basophils Relative: 1 % (ref 0–1)
Eosinophils Absolute: 0.1 10*3/uL (ref 0.0–0.7)
Eosinophils Relative: 0 % (ref 0–5)
Eosinophils Relative: 1 % (ref 0–5)
Monocytes Absolute: 1.2 10*3/uL — ABNORMAL HIGH (ref 0.1–1.0)
Monocytes Relative: 9 % (ref 3–12)
Neutro Abs: 9.9 10*3/uL — ABNORMAL HIGH (ref 1.7–7.7)
Neutrophils Relative %: 88 % — ABNORMAL HIGH (ref 43–77)

## 2010-07-14 LAB — CARDIAC PANEL(CRET KIN+CKTOT+MB+TROPI)
CK, MB: 1.9 ng/mL (ref 0.3–4.0)
Relative Index: 1 (ref 0.0–2.5)
Relative Index: 1.2 (ref 0.0–2.5)
Relative Index: 1.3 (ref 0.0–2.5)
Total CK: 270 U/L — ABNORMAL HIGH (ref 7–232)
Troponin I: 0.11 ng/mL — ABNORMAL HIGH (ref 0.00–0.06)
Troponin I: 0.12 ng/mL — ABNORMAL HIGH (ref 0.00–0.06)
Troponin I: 0.13 ng/mL — ABNORMAL HIGH (ref 0.00–0.06)

## 2010-07-14 LAB — TSH: TSH: 1.34 u[IU]/mL (ref 0.350–4.500)

## 2010-07-14 LAB — CBC
HCT: 42.8 % (ref 39.0–52.0)
HCT: 45.5 % (ref 39.0–52.0)
HCT: 47.4 % (ref 39.0–52.0)
HCT: 47.4 % (ref 39.0–52.0)
Hemoglobin: 14.4 g/dL (ref 13.0–17.0)
Hemoglobin: 15.9 g/dL (ref 13.0–17.0)
Hemoglobin: 16.4 g/dL (ref 13.0–17.0)
MCH: 33.9 pg (ref 26.0–34.0)
MCHC: 33.6 g/dL (ref 30.0–36.0)
MCHC: 34 g/dL (ref 30.0–36.0)
MCV: 99.5 fL (ref 78.0–100.0)
Platelets: 264 10*3/uL (ref 150–400)
RBC: 4.81 MIL/uL (ref 4.22–5.81)
RDW: 12.9 % (ref 11.5–15.5)
RDW: 12.9 % (ref 11.5–15.5)
RDW: 13 % (ref 11.5–15.5)
WBC: 10.4 10*3/uL (ref 4.0–10.5)
WBC: 13 10*3/uL — ABNORMAL HIGH (ref 4.0–10.5)
WBC: 8.6 10*3/uL (ref 4.0–10.5)

## 2010-07-14 LAB — LIPID PANEL
Triglycerides: 56 mg/dL (ref <150)
VLDL: 11 mg/dL (ref 0–40)

## 2010-07-14 LAB — RAPID URINE DRUG SCREEN, HOSP PERFORMED
Amphetamines: NOT DETECTED
Barbiturates: NOT DETECTED
Tetrahydrocannabinol: NOT DETECTED

## 2010-07-14 LAB — BASIC METABOLIC PANEL
BUN: 20 mg/dL (ref 6–23)
BUN: 27 mg/dL — ABNORMAL HIGH (ref 6–23)
CO2: 23 mEq/L (ref 19–32)
CO2: 28 mEq/L (ref 19–32)
Chloride: 104 mEq/L (ref 96–112)
Creatinine, Ser: 0.84 mg/dL (ref 0.4–1.5)
Creatinine, Ser: 0.89 mg/dL (ref 0.4–1.5)
GFR calc Af Amer: >60 mL/min (ref >60)
GFR calc non Af Amer: >60 mL/min (ref >60)
GFR calc non Af Amer: >60 mL/min (ref >60)
GFR calc non Af Amer: >60 mL/min (ref >60)
Glucose, Bld: 109 mg/dL — ABNORMAL HIGH (ref 70–99)
Glucose, Bld: 125 mg/dL — ABNORMAL HIGH (ref 70–99)
Potassium: 3.6 mEq/L (ref 3.5–5.1)
Potassium: 3.7 mEq/L (ref 3.5–5.1)
Potassium: 4 mEq/L (ref 3.5–5.1)
Sodium: 138 mEq/L (ref 135–145)
Sodium: 142 mEq/L (ref 135–145)

## 2010-07-14 LAB — GLUCOSE, CAPILLARY: Glucose-Capillary: 130 mg/dL — ABNORMAL HIGH (ref 70–99)

## 2010-07-14 LAB — COMPREHENSIVE METABOLIC PANEL
ALT: 19 U/L (ref 0–53)
AST: 38 U/L — ABNORMAL HIGH (ref 0–37)
Alkaline Phosphatase: 55 U/L (ref 39–117)
CO2: 23 mEq/L (ref 19–32)
Chloride: 107 mEq/L (ref 96–112)
GFR calc Af Amer: >60 mL/min (ref >60)
GFR calc non Af Amer: >60 mL/min (ref >60)
Glucose, Bld: 119 mg/dL — ABNORMAL HIGH (ref 70–99)
Sodium: 140 mEq/L (ref 135–145)
Total Bilirubin: 1.6 mg/dL — ABNORMAL HIGH (ref 0.3–1.2)

## 2010-07-14 LAB — CULTURE, BLOOD (ROUTINE X 2): Culture: NO GROWTH

## 2010-07-14 LAB — URINE CULTURE

## 2010-07-14 LAB — TROPONIN I: Troponin I: 0.13 ng/mL — ABNORMAL HIGH (ref 0.00–0.06)

## 2010-07-14 LAB — HEMOGLOBIN A1C
Hgb A1c MFr Bld: 6.1 % — ABNORMAL HIGH (ref <5.7)
Mean Plasma Glucose: 128 mg/dL — ABNORMAL HIGH (ref <117)

## 2010-07-14 LAB — POCT CARDIAC MARKERS
CKMB, poc: 2 ng/mL (ref 1.0–8.0)
Troponin i, poc: <0.05 ng/mL (ref 0.00–0.09)

## 2010-07-23 ENCOUNTER — Other Ambulatory Visit: Payer: Self-pay | Admitting: *Deleted

## 2010-07-23 DIAGNOSIS — J449 Chronic obstructive pulmonary disease, unspecified: Secondary | ICD-10-CM

## 2010-07-24 MED ORDER — BUDESONIDE-FORMOTEROL FUMARATE 80-4.5 MCG/ACT IN AERO: 2.0000 | INHALATION_SPRAY | Freq: Two times a day (BID) | RESPIRATORY_TRACT | Status: DC

## 2010-08-12 ENCOUNTER — Other Ambulatory Visit: Payer: Self-pay | Admitting: Internal Medicine

## 2010-08-30 ENCOUNTER — Other Ambulatory Visit: Payer: Self-pay | Admitting: Internal Medicine

## 2010-09-01 ENCOUNTER — Other Ambulatory Visit: Payer: Self-pay | Admitting: *Deleted

## 2010-09-01 ENCOUNTER — Other Ambulatory Visit: Payer: Self-pay | Admitting: Internal Medicine

## 2010-09-02 MED ORDER — DULOXETINE HCL 60 MG PO CPEP: 60.0000 mg | ORAL_CAPSULE | Freq: Every day | ORAL | Status: DC
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
## 2010-09-12 NOTE — Op Note (Signed)
NAME:  Todd [name], Todd [name] NO.:  [id number]   MEDICAL RECORD NO.:  [id number]          PATIENT TYPE:  INP   LOCATION:  5702                         FACILITY:  [facility]   PHYSICIAN:  Petra Kuba, M.D.    DATE OF BIRTH:  07/16/1945   DATE OF PROCEDURE:  DATE OF DISCHARGE:                                 OPERATIVE REPORT   PROCEDURE:  Colonoscopy.   INDICATIONS:  Screening.  Consent was signed after risks, benefits, methods,  and options were thoroughly discussed in the office and prior to sedation  given and with his power of attorney, common law wife.   MEDICINES USED:  Fentanyl 100 mcg, Versed 10 mg.   DESCRIPTION OF THE PROCEDURE:  Rectal inspection was pertinent for small  external hemorrhoids.  Digital exam was negative.  Video colonoscope was  inserted and with extreme difficulty due a long, looping, tortuous colon  which required multiple abdominal compressions and multiple position  changes.  We were able to advance to the cecum which was identified by the  appendiceal orifice and the ileocecal valve.  No abnormalities were seen on  insertion.  The scope was slowly withdrawn.  The prep was adequate.  There  was some liquid stool that required washing and suctioning.  When we fell  back around a tortuous loop, we did try to readvance around the curves to  decrease chances of missing things, but on slow withdrawal back to the  rectum, no abnormalities were seen.  Specifically, no polyps, tumors, masses  or diverticula.  Once back in the rectum, anorectal pull-through and  retroflexion confirmed some small hemorrhoids.  The scope was readvanced up  the left side of the colon.  Air was suctioned, scope removed.  The patient  tolerated the procedure well.  There were no obvious immediate complication.   ENDOSCOPIC DIAGNOSES:  1. Internal and external hemorrhoids.  2. Long, looping tortuous colon.  3. Otherwise within normal limits to the cecum.   PLAN:  I would  be happy to see back p.r.n.  Return care to the medical  clinic.  In 10 years when repeat screening is needed, if doing well  medically consider virtual colonoscopy or other types of colonic screening  available at that time since colonoscopy is very difficult, although I would  be happy to retry if that is all that is available or covered.           ______________________________  Petra Kuba, M.D.     MEM/MEDQ  D:  03/11/2006  T:  03/12/2006  Job:  [id number]   cc:   Dennis Bast, MD  Judie Grieve, MD

## 2010-09-12 NOTE — Discharge Summary (Signed)
[facility]  Patient:    Todd [name], Todd [name]                     MRN: [id number] Adm. Date:  [id number] Disc. Date: [id number] Attending:  Farley Ly Dictator:   Dante Gang, M.D.                           Discharge Summary  DATE OF BIRTH:  [birth year]  REASON FOR ADMISSION:  Weakness and inability to ambulate.  HISTORY OF PRESENT ILLNESS:  Mr. Rudie Meyer is a 65 year old male with a history of organic brain syndrome with extensive encephalomalacia.  His aide reported that he was having difficulty ambulating and difficulty chewing and swallowing food as well as a headache on the afternoon of the day of admission.  He denies any change in his vision, any focal weaknesses or numbness, nor any seizure activity.  He had just had a recent 7-day hospitalization with respiratory distress and pneumonia possibly secondary to difficulty handling secretions with possible aspiration.  He had been improving on steroids, nebulizers, and antibiotics and was sent home three days prior to admission.  His wife reported that he had still not returned to baseline from that illness and was still having weakness, decreased p.o. intake.  He also had a minor fall three nights before in his bedroom.  The patient was found to be hypotensive.  He was placed on IV fluids and his blood pressure responded to volume replacement.  He remained afebrile and a CT of the head did not show acute changes.  CT of the head did show sinusitis as an incidental finding.  He was placed on Augmentin to treat this.  The patient was dismissed on October 28, 2000, with orders for home physical therapy.  CONDITION ON DISCHARGE:  Stable.  DISCHARGE MEDICATIONS: 1. Tequin 400 mg q.d. x 3 days. 2. Prednisone taper 10 mg four tablets x 3 days, two tablets x 3 days, one    tablet x 3 days. 3. Albuterol inhaler. 4. Paxil 30 mg q.d. 5. He had previously been on Valium 5 mg, but he was  encouraged to discontinue    this medication.  FOLLOW-UP:  The patient had follow-up appointments with Dr. Ezzard Standing of ENT on Monday, July 15, at 2:15 p.m.  He also had an appointment in the [facility] on Friday, July 5, at 3:15 p.m. DD:  11/04/00 TD:  11/05/00 Job: [id number]
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
## 2010-09-16 ENCOUNTER — Other Ambulatory Visit: Payer: Self-pay | Admitting: Internal Medicine

## 2010-10-20 ENCOUNTER — Encounter: Payer: Self-pay | Admitting: Internal Medicine

## 2010-10-20 ENCOUNTER — Ambulatory Visit (HOSPITAL_COMMUNITY)
Admission: EM | Admit: 2010-10-20 | Discharge: 2010-10-28 | Disposition: A | Discharge status: Skilled Nursing Facility | Payer: Medicaid Other | Attending: Internal Medicine | Admitting: Internal Medicine

## 2010-10-20 ENCOUNTER — Emergency Department (HOSPITAL_COMMUNITY): Payer: Medicaid Other

## 2010-10-20 DIAGNOSIS — Y92009 Unspecified place in unspecified non-institutional (private) residence as the place of occurrence of the external cause: Secondary | ICD-10-CM | POA: Insufficient documentation

## 2010-10-20 DIAGNOSIS — W19XXXA Unspecified fall, initial encounter: Secondary | ICD-10-CM | POA: Insufficient documentation

## 2010-10-20 DIAGNOSIS — R269 Unspecified abnormalities of gait and mobility: Secondary | ICD-10-CM | POA: Insufficient documentation

## 2010-10-20 DIAGNOSIS — F3289 Other specified depressive episodes: Secondary | ICD-10-CM | POA: Insufficient documentation

## 2010-10-20 DIAGNOSIS — F329 Major depressive disorder, single episode, unspecified: Secondary | ICD-10-CM | POA: Insufficient documentation

## 2010-10-20 DIAGNOSIS — I2510 Atherosclerotic heart disease of native coronary artery without angina pectoris: Secondary | ICD-10-CM | POA: Insufficient documentation

## 2010-10-20 DIAGNOSIS — F411 Generalized anxiety disorder: Secondary | ICD-10-CM | POA: Insufficient documentation

## 2010-10-20 DIAGNOSIS — G939 Disorder of brain, unspecified: Secondary | ICD-10-CM | POA: Insufficient documentation

## 2010-10-20 DIAGNOSIS — S32009A Unspecified fracture of unspecified lumbar vertebra, initial encounter for closed fracture: Secondary | ICD-10-CM | POA: Insufficient documentation

## 2010-10-20 LAB — DIFFERENTIAL
Eosinophils Absolute: 0.1 10*3/uL (ref 0.0–0.7)
Eosinophils Relative: 1 % (ref 0–5)
Lymphocytes Relative: 14 % (ref 12–46)
Lymphs Abs: 1.8 10*3/uL (ref 0.7–4.0)
Monocytes Relative: 10 % (ref 3–12)
Neutrophils Relative %: 75 % (ref 43–77)

## 2010-10-20 LAB — CBC
HCT: 43.6 % (ref 39.0–52.0)
MCH: 33.8 pg (ref 26.0–34.0)
MCV: 96.9 fL (ref 78.0–100.0)
RBC: 4.5 MIL/uL (ref 4.22–5.81)
WBC: 12.6 10*3/uL — ABNORMAL HIGH (ref 4.0–10.5)

## 2010-10-20 LAB — TROPONIN I: Troponin I: <0.3 ng/mL (ref <0.30)

## 2010-10-20 LAB — BASIC METABOLIC PANEL
BUN: 20 mg/dL (ref 6–23)
Calcium: 9.3 mg/dL (ref 8.4–10.5)
Creatinine, Ser: 0.7 mg/dL (ref 0.50–1.35)
GFR calc Af Amer: >60 mL/min (ref >60)
GFR calc non Af Amer: >60 mL/min (ref >60)

## 2010-10-20 LAB — URINE MICROSCOPIC-ADD ON

## 2010-10-20 LAB — URINALYSIS, ROUTINE W REFLEX MICROSCOPIC
Bilirubin Urine: NEGATIVE
Hgb urine dipstick: NEGATIVE
Ketones, ur: 15 mg/dL — AB
Specific Gravity, Urine: 1.029 (ref 1.005–1.030)
pH: 6 (ref 5.0–8.0)

## 2010-10-20 LAB — CK TOTAL AND CKMB (NOT AT ARMC)
CK, MB: 4.7 ng/mL — ABNORMAL HIGH (ref 0.3–4.0)
Relative Index: 0.9 (ref 0.0–2.5)
Total CK: 546 U/L — ABNORMAL HIGH (ref 7–232)
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
## 2010-10-21 ENCOUNTER — Encounter: Payer: Self-pay | Admitting: Internal Medicine

## 2010-10-21 DIAGNOSIS — W19XXXA Unspecified fall, initial encounter: Secondary | ICD-10-CM

## 2010-10-21 DIAGNOSIS — S32009A Unspecified fracture of unspecified lumbar vertebra, initial encounter for closed fracture: Secondary | ICD-10-CM

## 2010-10-21 LAB — VITAMIN D 25 HYDROXY (VIT D DEFICIENCY, FRACTURES): Vit D, 25-Hydroxy: 30 ng/mL (ref 30–89)

## 2010-10-21 LAB — LIPID PANEL
LDL Cholesterol: 103 mg/dL — ABNORMAL HIGH (ref 0–99)
Triglycerides: 56 mg/dL (ref <150)
VLDL: 11 mg/dL (ref 0–40)

## 2010-10-21 LAB — HEPATIC FUNCTION PANEL
Albumin: 3.7 g/dL (ref 3.5–5.2)
Alkaline Phosphatase: 50 U/L (ref 39–117)
Bilirubin, Direct: 0.2 mg/dL (ref 0.0–0.3)
Indirect Bilirubin: 1.1 mg/dL — ABNORMAL HIGH (ref 0.3–0.9)
Total Bilirubin: 1.3 mg/dL — ABNORMAL HIGH (ref 0.3–1.2)

## 2010-10-21 LAB — CARDIAC PANEL(CRET KIN+CKTOT+MB+TROPI)
CK, MB: 6.4 ng/mL (ref 0.3–4.0)
CK, MB: 5.9 ng/mL — ABNORMAL HIGH (ref 0.3–4.0)

## 2010-10-21 LAB — TESTOSTERONE, % FREE: Testosterone-% Free: 1.7 % — ABNORMAL HIGH (ref 1.6–2.9)

## 2010-10-21 LAB — SEX HORMONE BINDING GLOBULIN: Sex Hormone Binding: 36 nmol/L (ref 13–71)

## 2010-10-21 LAB — TSH: TSH: 1.165 u[IU]/mL (ref 0.350–4.500)

## 2010-10-21 LAB — TESTOSTERONE: Testosterone: 85.11 ng/dL — ABNORMAL LOW (ref 250–890)

## 2010-10-21 LAB — TESTOSTERONE, FREE: Testosterone, Free: 14.7 pg/mL — ABNORMAL LOW (ref 47.0–244.0)

## 2010-10-22 DIAGNOSIS — W19XXXA Unspecified fall, initial encounter: Secondary | ICD-10-CM

## 2010-10-22 DIAGNOSIS — S32009A Unspecified fracture of unspecified lumbar vertebra, initial encounter for closed fracture: Secondary | ICD-10-CM

## 2010-10-22 LAB — CBC
Hemoglobin: 14.4 g/dL (ref 13.0–17.0)
MCHC: 34.1 g/dL (ref 30.0–36.0)
MCV: 97.5 fL (ref 78.0–100.0)
RBC: 4.33 MIL/uL (ref 4.22–5.81)
RDW: 13 % (ref 11.5–15.5)
WBC: 8.4 10*3/uL (ref 4.0–10.5)

## 2010-10-22 LAB — BASIC METABOLIC PANEL
Chloride: 108 mEq/L (ref 96–112)
Creatinine, Ser: 0.69 mg/dL (ref 0.50–1.35)
GFR calc Af Amer: >60 mL/min (ref >60)
GFR calc non Af Amer: >60 mL/min (ref >60)
Glucose, Bld: 98 mg/dL (ref 70–99)
Potassium: 4 mEq/L (ref 3.5–5.1)
Sodium: 141 mEq/L (ref 135–145)

## 2010-10-22 LAB — URINE CULTURE: Colony Count: >=100000

## 2010-10-22 LAB — LIPID PANEL
HDL: 35 mg/dL — ABNORMAL LOW (ref >39)
Triglycerides: 74 mg/dL (ref <150)
VLDL: 15 mg/dL (ref 0–40)

## 2010-10-22 LAB — PROTEIN ELECTROPH W RFLX QUANT IMMUNOGLOBULINS
Albumin ELP: 56 % (ref 55.8–66.1)
Total Protein ELP: 6.9 g/dL (ref 6.0–8.3)

## 2010-10-23 DIAGNOSIS — S32009A Unspecified fracture of unspecified lumbar vertebra, initial encounter for closed fracture: Secondary | ICD-10-CM

## 2010-10-23 DIAGNOSIS — W19XXXA Unspecified fall, initial encounter: Secondary | ICD-10-CM

## 2010-10-24 DIAGNOSIS — S32009A Unspecified fracture of unspecified lumbar vertebra, initial encounter for closed fracture: Secondary | ICD-10-CM

## 2010-10-24 DIAGNOSIS — W19XXXA Unspecified fall, initial encounter: Secondary | ICD-10-CM

## 2010-10-27 DIAGNOSIS — S32009A Unspecified fracture of unspecified lumbar vertebra, initial encounter for closed fracture: Secondary | ICD-10-CM

## 2010-10-27 DIAGNOSIS — W19XXXA Unspecified fall, initial encounter: Secondary | ICD-10-CM

## 2010-10-28 DIAGNOSIS — S32009A Unspecified fracture of unspecified lumbar vertebra, initial encounter for closed fracture: Secondary | ICD-10-CM

## 2010-10-28 DIAGNOSIS — W19XXXA Unspecified fall, initial encounter: Secondary | ICD-10-CM

## 2010-11-11 ENCOUNTER — Encounter: Payer: Medicaid Other | Admitting: Internal Medicine
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
## 2010-12-01 ENCOUNTER — Other Ambulatory Visit: Payer: Self-pay | Admitting: Internal Medicine

## 2010-12-02 ENCOUNTER — Telehealth: Payer: Self-pay | Admitting: *Deleted

## 2010-12-02 NOTE — Telephone Encounter (Signed)
Pt's wife calls and request dr Sumner Boast license #, states he filled pt's handicap sticker form out but did not put his license # on the form. Please call her at [phone] or [phone]. Or if this is acceptable please tell me and i will call her back

## 2010-12-02 NOTE — Telephone Encounter (Signed)
I think this should be fine.  Thank you. 

## 2010-12-03 ENCOUNTER — Other Ambulatory Visit: Payer: Self-pay | Admitting: *Deleted

## 2010-12-04 MED ORDER — DICLOFENAC SODIUM 75 MG PO TBEC: 75.0000 mg | DELAYED_RELEASE_TABLET | Freq: Two times a day (BID) | ORAL | Status: DC

## 2010-12-08 ENCOUNTER — Other Ambulatory Visit: Payer: Self-pay | Admitting: *Deleted

## 2010-12-09 MED ORDER — DESONIDE 0.05 % EX CREA: TOPICAL_CREAM | Freq: Two times a day (BID) | CUTANEOUS | Status: DC

## 2010-12-26 ENCOUNTER — Other Ambulatory Visit: Payer: Self-pay | Admitting: *Deleted

## 2010-12-26 ENCOUNTER — Encounter: Payer: Medicaid Other | Admitting: Internal Medicine

## 2010-12-26 ENCOUNTER — Telehealth: Payer: Self-pay | Admitting: *Deleted

## 2010-12-26 MED ORDER — HYDROCODONE-IBUPROFEN 5-200 MG PO TABS: 1.0000 | ORAL_TABLET | Freq: Three times a day (TID) | ORAL | Status: DC | PRN

## 2010-12-26 MED ORDER — HYDROCODONE-IBUPROFEN 7.5-200 MG PO TABS: 1.0000 | ORAL_TABLET | Freq: Three times a day (TID) | ORAL | Status: DC | PRN

## 2010-12-26 NOTE — Telephone Encounter (Signed)
Called to pharm, called spouse informed her of need for appt per dr Josem Kaufmann and that a call in for pain med will not be honored again w/o an appt, she is agreeable and is transferred to scheduler for appt

## 2010-12-26 NOTE — Telephone Encounter (Signed)
Pt's wife states pt will need vicoprofen, he cannot take tylenol. States he is having horrible back pain, it was advised that he be seen today at 1500 but she refused stating she cannot get him in the car

## 2010-12-26 NOTE — Telephone Encounter (Signed)
With history of hepatitis C will change to vicoprofen 5-200.  He has not been seen in clinic for nearly 10 months and has no-showed most recent appointments.  Further refills will require that he be seen in clinic at Dr. Sumner Boast first open non-overbook appointment to assure this is appropriate therapy for Todd [name] given his continued severe pain.

## 2010-12-26 NOTE — Telephone Encounter (Signed)
Prescription changed within [facility].  Thank you for calling it in for Todd [name].

## 2010-12-26 NOTE — Telephone Encounter (Signed)
Pharm calls and states that vicoprofen only comes in 7.5/200, could we change this, i will call to pharm.   nicole [phone]  Thanks, h.

## 2010-12-26 NOTE — Telephone Encounter (Signed)
Called pharm and changed to 7.5/200 per dr Josem Kaufmann, verbally

## 2011-01-07 ENCOUNTER — Other Ambulatory Visit: Payer: Self-pay | Admitting: Internal Medicine

## 2011-01-13 ENCOUNTER — Telehealth: Payer: Self-pay | Admitting: *Deleted

## 2011-01-13 NOTE — Telephone Encounter (Signed)
Request refill on Ibuprofen 600mg  - take 1 tab by mouth 4 times w/food if needed. Last refilled 12/24/10

## 2011-01-15 NOTE — Telephone Encounter (Signed)
This is not on patient's med list, and I don't see where we prescribed it.  Can you find out the details of the prior prescription, including who prescribed it?

## 2011-01-15 NOTE — Telephone Encounter (Signed)
Pt was called;no answer. I called [facility] pharmacy; she sated Ibuprofen was prescribed by Dr. Candy Sledge 10/28/10.

## 2011-01-15 NOTE — Telephone Encounter (Signed)
Patient was most recently prescribed Vicoprofen on 8/31; please find out which medication the patient was requesting a refill for.

## 2011-01-16 ENCOUNTER — Telehealth: Payer: Self-pay | Admitting: *Deleted

## 2011-01-16 MED ORDER — HYDROCODONE-IBUPROFEN 7.5-200 MG PO TABS: 1.0000 | ORAL_TABLET | Freq: Two times a day (BID) | ORAL | Status: DC | PRN

## 2011-01-16 NOTE — Telephone Encounter (Signed)
Pt is requesting a refill on Ibuprofen 600 mg tablets.  Take 1 tablet by mouth four times a day with food if needed. Dispense # 15.  Last refilled on 12/24/2010.

## 2011-01-16 NOTE — Telephone Encounter (Signed)
Rx called in and pt informed of instructions

## 2011-01-16 NOTE — Telephone Encounter (Signed)
Pls ask pt to keep appt on the1st - will need assessment, contract, and determination on monthly quantity required.  Based on last quantity, taking one or two per day. Pls ask pt to take no OTC NSAIDs Last BMP OK per renal fxn

## 2011-01-16 NOTE — Telephone Encounter (Signed)
I  talked to pt's wife. She said Ibuprofen was ordered by Dr Anselm Jungling when pt was in the hospital in June; Qty #60 for 1 tab 4 times daily PRN. But wants Vicoprofen 7.5/200mg  1 tab q8h instead; last refilled 8/31 qty#30.  States he can not take acetaminophen. He has an appt w/Dr. Eben Burow Oct 1Thanks

## 2011-01-26 ENCOUNTER — Ambulatory Visit (INDEPENDENT_AMBULATORY_CARE_PROVIDER_SITE_OTHER): Payer: Medicaid Other | Admitting: Internal Medicine

## 2011-01-26 ENCOUNTER — Encounter: Payer: Self-pay | Admitting: Internal Medicine

## 2011-01-26 VITALS — BP 113/73 | HR 96 | Wt 195.2 lb

## 2011-01-26 DIAGNOSIS — Z299 Encounter for prophylactic measures, unspecified: Secondary | ICD-10-CM

## 2011-01-26 DIAGNOSIS — Z23 Encounter for immunization: Secondary | ICD-10-CM

## 2011-01-26 DIAGNOSIS — Z791 Long term (current) use of non-steroidal anti-inflammatories (NSAID): Secondary | ICD-10-CM

## 2011-01-26 DIAGNOSIS — B1920 Unspecified viral hepatitis C without hepatic coma: Secondary | ICD-10-CM

## 2011-01-26 DIAGNOSIS — W19XXXA Unspecified fall, initial encounter: Secondary | ICD-10-CM

## 2011-01-26 DIAGNOSIS — S32000A Wedge compression fracture of unspecified lumbar vertebra, initial encounter for closed fracture: Secondary | ICD-10-CM

## 2011-01-26 DIAGNOSIS — F322 Major depressive disorder, single episode, severe without psychotic features: Secondary | ICD-10-CM

## 2011-01-26 DIAGNOSIS — K279 Peptic ulcer, site unspecified, unspecified as acute or chronic, without hemorrhage or perforation: Secondary | ICD-10-CM

## 2011-01-26 DIAGNOSIS — B1710 Acute hepatitis C without hepatic coma: Secondary | ICD-10-CM

## 2011-01-26 LAB — COMPLETE METABOLIC PANEL WITH GFR
AST: 42 U/L — ABNORMAL HIGH (ref 0–37)
Albumin: 4.6 g/dL (ref 3.5–5.2)
BUN: 20 mg/dL (ref 6–23)
Calcium: 9.4 mg/dL (ref 8.4–10.5)
Chloride: 104 mEq/L (ref 96–112)
Creat: 0.92 mg/dL (ref 0.50–1.35)
GFR, Est Non African American: >60 mL/min (ref >60)
Glucose, Bld: 101 mg/dL — ABNORMAL HIGH (ref 70–99)
Potassium: 4.1 mEq/L (ref 3.5–5.3)

## 2011-01-26 MED ORDER — HYDROCODONE-IBUPROFEN 7.5-200 MG PO TABS: 1.0000 | ORAL_TABLET | Freq: Four times a day (QID) | ORAL | Status: DC | PRN

## 2011-01-26 MED ORDER — IBUPROFEN 200 MG PO TABS: 600.0000 mg | ORAL_TABLET | Freq: Four times a day (QID) | ORAL | Status: DC | PRN

## 2011-01-26 MED ORDER — CALCITONIN (SALMON) 200 UNIT/ML IJ SOLN: 100.0000 [IU] | INTRAMUSCULAR | Status: DC

## 2011-01-26 MED ORDER — ESOMEPRAZOLE MAGNESIUM 20 MG PO CPDR: 20.0000 mg | DELAYED_RELEASE_CAPSULE | Freq: Every day | ORAL | Status: DC

## 2011-01-26 MED ORDER — DESONIDE 0.05 % EX CREA: TOPICAL_CREAM | Freq: Two times a day (BID) | CUTANEOUS | Status: DC

## 2011-01-26 NOTE — Patient Instructions (Signed)
Fall Prevention Falls are the leading cause of injuries, accidents, and accidental deaths in people over the age of 64. Falling is a real threat to your ability to live on your own. CAUSES  Poor eyesight or poor hearing can make you more likely to fall.   Illnesses and physical conditions can affect your strength and balance.   Poor lighting, throw rugs and pets in your home can make you more likely to trip or slip.   The side effects of some medicines can upset your balance and lead to falling. These include medicines for depression, sleep problems, high blood pressure, diabetes, and heart conditions.  PREVENTION Be sure your home is as safe as possible. Here are some tips:  Wear shoes with non-skid soles (not house slippers).   Be sure your home and outside area are well lit.   Use night lights throughout your house, including hallways and stairways.   Remove clutter and clean up spills on floors and walkways.   Remove throw rugs or fasten them to the floor with carpet tape. Tack down carpet edges.   Do not place electrical cords across pathways.   Install grab bars in your bathtub, shower, and toilet area. Towel bars should not be used as a grab bar.   Install handrails on both sides of stairways.   Do not climb on stools or stepladders. Get someone else to help with jobs that require climbing.   Do not wax your floors at all, or use a non-skid wax.   Repair uneven or unsafe sidewalks, walkways or stairs.   Keep frequently used items within reach.   Be aware of pets so you do not trip.  Get regular check-ups from your doctor, and take good care of yourself:  Have your eyes checked every year for vision changes, cataracts, glaucoma, and other eye problems. Wear eyeglasses as directed.   Have your hearing checked every 2 years, or anytime you or others think that you cannot hear well. Use hearing aids as directed.   See your caregiver if you have foot pain or corns. Sore  feet can contribute to falls.   Let your caregiver know if a medicine is making you feel dizzy or making you lose your balance.   Use a cane, walker, or wheelchair as directed. Use walker or wheelchair brakes when getting in and out.   When you get up from bed, sit on the side of the bed for 1 to 2 minutes before you stand up. This will give your blood pressure time to adjust, and you will feel less dizzy.   If you need to go to the bathroom often, consider using a bedside commode.  Keep your body in good shape:  Get regular exercise, especially walking.   Do exercises to strengthen the muscles you use for walking and lifting.   Do not smoke.   Minimize use of alcohol.  SEEK IMMEDIATE MEDICAL CARE IF:  You feel dizzy, weak, or unsteady on your feet.   You feel confused.   You fall.  Document Released: 04/13/2005 Document Re-Released: 10/01/2009 [facility] Patient Information 2011 [place], [place].

## 2011-01-26 NOTE — Assessment & Plan Note (Signed)
Avoid Tylenol for pain control. We'll check cmet today.

## 2011-01-26 NOTE — Assessment & Plan Note (Signed)
Continue on current meds.

## 2011-01-26 NOTE — Progress Notes (Signed)
  Subjective:    Patient ID: Todd [name], male    DOB: [birth year], 65 y.o.   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 He has had aphase and difficulty with physical movements ever since.  His wife takes care of him. The patient was admitted in end of June for a fall. Imaging studies were suggestive of a fractures in the lumbar region. Patient has been making slow progress. The wife states that he is still in a lot of pain and requires pain medications every 4-6 hours. Was prescribed Vicoprofen which works well for the pain. Patient had been getting some physical therapy but has not been able to get back to his baseline status as yet. Patient has been using a lot of ibuprofen over-the-counter last few days and I will check kidney function today.   Patient needs refill of desonide cream.  Calcitonin was prescribed in the nasal spray form which was difficult for them to use. The wife is asking for the injectable form.  Patient is due for a tetanus shot and a flu shot. He is also due for colonoscopy.       Review of Systems  Constitutional: Negative for fever, activity change and appetite change.  HENT: Negative for sore throat.   Respiratory: Negative for cough and shortness of breath.   Cardiovascular: Negative for chest pain and leg swelling.  Gastrointestinal: Negative for nausea, abdominal pain, diarrhea, constipation and abdominal distention.  Genitourinary: Negative for frequency, hematuria and difficulty urinating.  Musculoskeletal: Positive for back pain and gait problem.  Neurological: Negative for dizziness and headaches.       He has baseline neurological abnormalities.  Psychiatric/Behavioral: Negative for suicidal ideas and behavioral problems.       Objective:   Physical Exam  Constitutional: He is oriented to person, place, and time. He appears well-developed and  well-nourished.  HENT:  Head: Normocephalic and atraumatic.       [name] right eye is closed as usual and his mouth is open.  Eyes: Conjunctivae and EOM are normal. Pupils are equal, round, and reactive to light. No scleral icterus.  Neck: Normal range of motion. Neck supple. No JVD present. No thyromegaly present.  Cardiovascular: Normal rate, regular rhythm, normal heart sounds and intact distal pulses.  Exam reveals no gallop and no friction rub.   No murmur heard. Pulmonary/Chest: Effort normal and breath sounds normal. No respiratory distress. He has no wheezes. He has no rales.  Abdominal: Soft. Bowel sounds are normal. He exhibits no distension and no mass. There is no tenderness. There is no rebound and no guarding.  Musculoskeletal: Normal range of motion. He exhibits no edema and no tenderness.       Patient has difficulty walking and needs to be supported by 2 people to help him.  Lymphadenopathy:    He has no cervical adenopathy.  Neurological: He is alert and oriented to person, place, and time.  Psychiatric: He has a normal mood and affect. His behavior is normal.          Assessment & Plan:

## 2011-01-26 NOTE — Assessment & Plan Note (Signed)
I have changed Protonix to Nexium as Plavix has shown to be less interactive with Nexium.

## 2011-01-30 NOTE — Telephone Encounter (Signed)
I am denying this request. I had a discussion with the patient's wife and talked about the side effects of long term NSAID use. We decided upon Vicoprofen as the choice of pain killer for him.

## 2011-02-06 ENCOUNTER — Other Ambulatory Visit: Payer: Self-pay | Admitting: Internal Medicine

## 2011-02-06 LAB — BASIC METABOLIC PANEL WITH GFR
BUN: 21
BUN: 13
BUN: 15
CO2: 28
CO2: 25
CO2: 26
Calcium: 9.1
Calcium: 9.2
Chloride: 112
Chloride: 107
Chloride: 108
Creatinine, Ser: 0.75
Creatinine, Ser: 0.8
GFR calc non Af Amer: >60
GFR calc non Af Amer: >60
Glucose, Bld: 109 — ABNORMAL HIGH
Glucose, Bld: 86
Glucose, Bld: 98
Potassium: 3.8
Potassium: 3.7
Potassium: 4
Sodium: 140
Sodium: 140

## 2011-02-06 LAB — PREALBUMIN: Prealbumin: 17.9 — ABNORMAL LOW

## 2011-02-06 LAB — VITAMIN B12: Vitamin B-12: 492 (ref 211–911)

## 2011-02-06 LAB — BASIC METABOLIC PANEL
BUN: 13
CO2: 27
Calcium: 9.4
Creatinine, Ser: 0.78
Creatinine, Ser: 0.89
GFR calc Af Amer: >60
GFR calc non Af Amer: >60
GFR calc non Af Amer: >60
GFR calc non Af Amer: >60
Glucose, Bld: 104 — ABNORMAL HIGH
Potassium: 3.6
Sodium: 143
Sodium: 140

## 2011-02-06 LAB — COMPREHENSIVE METABOLIC PANEL WITH GFR
ALT: 14
AST: 41 — ABNORMAL HIGH
Albumin: 3.6
Alkaline Phosphatase: 40
BUN: 18
CO2: 23
Calcium: 9
Chloride: 108
Creatinine, Ser: 0.79
GFR calc non Af Amer: >60
Glucose, Bld: 92
Potassium: 4.5
Sodium: 140
Total Bilirubin: 1.5 — ABNORMAL HIGH
Total Protein: 6.6

## 2011-02-06 LAB — URINALYSIS, ROUTINE W REFLEX MICROSCOPIC
Glucose, UA: NEGATIVE
Hgb urine dipstick: NEGATIVE
Nitrite: NEGATIVE
Protein, ur: NEGATIVE
Specific Gravity, Urine: 1.03
Urobilinogen, UA: 2 — ABNORMAL HIGH
pH: 6

## 2011-02-06 LAB — CBC
HCT: 46
HCT: 44
HCT: 44
HCT: 46.3
Hemoglobin: 15.8
Hemoglobin: 14.9
Hemoglobin: 15.2
Hemoglobin: 15.8
MCHC: 34.3
MCHC: 34.1
MCHC: 34.5
MCV: 96.2
MCV: 96.1
MCV: 97.8
Platelets: 259
Platelets: 220
Platelets: 242
Platelets: 245
RBC: 4.78
RBC: 4.48
RBC: 4.58
RBC: 4.73
RDW: 13.9
RDW: 13.1
RDW: 13.6
RDW: 13.7
WBC: 8.1
WBC: 10.4
WBC: 6.9
WBC: 7.7

## 2011-02-06 LAB — POCT CARDIAC MARKERS
CKMB, poc: <1 — ABNORMAL LOW
Myoglobin, poc: 53.8
Operator id: 4295
Troponin i, poc: <0.05

## 2011-02-06 LAB — LIPID PANEL: HDL: 25 — ABNORMAL LOW

## 2011-02-06 LAB — RPR: RPR Ser Ql: NONREACTIVE

## 2011-02-06 LAB — TSH: TSH: 1.343

## 2011-03-05 ENCOUNTER — Encounter: Payer: Self-pay | Admitting: Internal Medicine

## 2011-03-05 ENCOUNTER — Ambulatory Visit (INDEPENDENT_AMBULATORY_CARE_PROVIDER_SITE_OTHER): Payer: Medicaid Other | Admitting: Internal Medicine

## 2011-03-05 VITALS — BP 106/72 | HR 69 | Temp 97.0°F

## 2011-03-05 DIAGNOSIS — W19XXXA Unspecified fall, initial encounter: Secondary | ICD-10-CM

## 2011-03-05 DIAGNOSIS — S32000A Wedge compression fracture of unspecified lumbar vertebra, initial encounter for closed fracture: Secondary | ICD-10-CM

## 2011-03-05 DIAGNOSIS — M8440XA Pathological fracture, unspecified site, initial encounter for fracture: Secondary | ICD-10-CM

## 2011-03-05 DIAGNOSIS — M8080XA Other osteoporosis with current pathological fracture, unspecified site, initial encounter for fracture: Secondary | ICD-10-CM | POA: Insufficient documentation

## 2011-03-05 DIAGNOSIS — S32009A Unspecified fracture of unspecified lumbar vertebra, initial encounter for closed fracture: Secondary | ICD-10-CM

## 2011-03-05 MED ORDER — HYDROCODONE-IBUPROFEN 7.5-200 MG PO TABS: 1.0000 | ORAL_TABLET | Freq: Three times a day (TID) | ORAL | Status: DC | PRN

## 2011-03-05 NOTE — Progress Notes (Signed)
  Subjective:    Patient ID: Todd [name], male    DOB: December 02, 1945, 65 y.o.   MRN: [id number]  HPI  Patient is a 65 year old man with PMH as noted in the chart is here today for follow up of his back pain.  He is complaining of back pain which is severe especially while walking. Patient cannot talk elaborately but speaks in broken words when asked specific questions. His wife and nurse are trying to get him to do as much physical activity as possible at home. The PT appointment was set up at last office visit but the PT was unable to reach them. The information was given today.  Review of Systems  Constitutional: Negative for fever, activity change and appetite change.  HENT: Negative for sore throat.   Respiratory: Negative for cough and shortness of breath.   Cardiovascular: Negative for chest pain and leg swelling.  Gastrointestinal: Negative for nausea, abdominal pain, diarrhea, constipation and abdominal distention.  Genitourinary: Negative for frequency, hematuria and difficulty urinating.  Musculoskeletal: Positive for back pain.  Neurological: Negative for dizziness and headaches.  Psychiatric/Behavioral: Negative for suicidal ideas and behavioral problems.       Objective:   Physical Exam  Constitutional: He is oriented to person, place, and time. He appears well-developed and well-nourished.  HENT:  Head: Normocephalic and atraumatic.       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  Eyes: Conjunctivae and EOM are normal. Pupils are equal, round, and reactive to light. No scleral icterus.  Neck: Normal range of motion. Neck supple. No JVD present. No thyromegaly present.  Cardiovascular: Normal rate, regular rhythm, normal heart sounds and intact distal pulses.  Exam reveals no gallop and no friction rub.   No murmur heard. Pulmonary/Chest: Effort normal. No respiratory distress. He has no wheezes. He has rales (right lung base).  Abdominal: Soft. Bowel sounds  are normal. He exhibits no distension and no mass. There is no tenderness. There is no rebound and no guarding.  Musculoskeletal: Normal range of motion. He exhibits tenderness. He exhibits no edema.  Lymphadenopathy:    He has no cervical adenopathy.  Neurological: He is alert and oriented to person, place, and time.  Psychiatric: He has a normal mood and affect. His behavior is normal.          Assessment & Plan:

## 2011-03-05 NOTE — Patient Instructions (Signed)
Back Pain, Adult Low back pain is very common. About 1 in 5 people have back pain.The cause of low back pain is rarely dangerous. The pain often gets better over time.About half of people with a sudden onset of back pain feel better in just 2 weeks. About 8 in 10 people feel better by 6 weeks.  CAUSES Some common causes of back pain include:  Strain of the muscles or ligaments supporting the spine.   Wear and tear (degeneration) of the spinal discs.   Arthritis.   Direct injury to the back.  DIAGNOSIS Most of the time, the direct cause of low back pain is not known.However, back pain can be treated effectively even when the exact cause of the pain is unknown.Answering your caregiver's questions about your overall health and symptoms is one of the most accurate ways to make sure the cause of your pain is not dangerous. If your caregiver needs more information, he or she may order lab work or imaging tests (X-rays or MRIs).However, even if imaging tests show changes in your back, this usually does not require surgery. HOME CARE INSTRUCTIONS For many people, back pain returns.Since low back pain is rarely dangerous, it is often a condition that people can learn to manageon their own.   Remain active. It is stressful on the back to sit or stand in one place. Do not sit, drive, or stand in one place for more than 30 minutes at a time. Take short walks on level surfaces as soon as pain allows.Try to increase the length of time you walk each day.   Do not stay in bed.Resting more than 1 or 2 days can delay your recovery.   Do not avoid exercise or work.Your body is made to move.It is not dangerous to be active, even though your back may hurt.Your back will likely heal faster if you return to being active before your pain is gone.   Pay attention to your body when you bend and lift. Many people have less discomfortwhen lifting if they bend their knees, keep the load close to their  bodies,and avoid twisting. Often, the most comfortable positions are those that put less stress on your recovering back.   Find a comfortable position to sleep. Use a firm mattress and lie on your side with your knees slightly bent. If you lie on your back, put a pillow under your knees.   Only take over-the-counter or prescription medicines as directed by your caregiver. Over-the-counter medicines to reduce pain and inflammation are often the most helpful.Your caregiver may prescribe muscle relaxant drugs.These medicines help dull your pain so you can more quickly return to your normal activities and healthy exercise.   Put ice on the injured area.   Put ice in a plastic bag.   Place a towel between your skin and the bag.   Leave the ice on for 15 to 20 minutes, 3 to 4 times a day for the first 2 to 3 days. After that, ice and heat may be alternated to reduce pain and spasms.   Ask your caregiver about trying back exercises and gentle massage. This may be of some benefit.   Avoid feeling anxious or stressed.Stress increases muscle tension and can worsen back pain.It is important to recognize when you are anxious or stressed and learn ways to manage it.Exercise is a great option.  SEEK MEDICAL CARE IF:  You have pain that is not relieved with rest or medicine.   You have   pain that does not improve in 1 week.   You have new symptoms.   You are generally not feeling well.  SEEK IMMEDIATE MEDICAL CARE IF:   You have pain that radiates from your back into your legs.   You develop new bowel or bladder control problems.   You have unusual weakness or numbness in your arms or legs.   You develop nausea or vomiting.   You develop abdominal pain.   You feel faint.  Document Released: 04/13/2005 Document Revised: 12/24/2010 Document Reviewed: 09/01/2010 [employer] Patient Information 2012 [employer]. 

## 2011-03-05 NOTE — Assessment & Plan Note (Signed)
Patient is about to be done with his calcitonin shots. -PT/OT appointment information communicated today. -Vicoprofen refill today for next 2 months and instructions to use it sparingly only for acute pain situations. -Follow up in 1 month to assess response to PT/OT.

## 2011-03-23 ENCOUNTER — Telehealth: Payer: Self-pay | Admitting: Licensed Clinical Social Worker

## 2011-03-23 NOTE — Telephone Encounter (Signed)
She is calling for [employer] renewal and looking for FL2 which was directed to my attention.  It is not in my box or PCP box.  She will hand deliver another one to me directly.

## 2011-03-24 ENCOUNTER — Encounter: Payer: Self-pay | Admitting: Licensed Clinical Social Worker

## 2011-03-24 DIAGNOSIS — S069X9A Unspecified intracranial injury with loss of consciousness of unspecified duration, initial encounter: Secondary | ICD-10-CM

## 2011-03-24 DIAGNOSIS — S069XAA Unspecified intracranial injury with loss of consciousness status unknown, initial encounter: Secondary | ICD-10-CM

## 2011-03-24 NOTE — Telephone Encounter (Signed)
Called Todd [name] at [employer]    [phone] to inform her to p/u completed and signed FL-2 at front desk cabinet.  Lanette will p/u by Friday.

## 2011-03-24 NOTE — Telephone Encounter (Signed)
Mrs. Todd [name]

## 2011-03-30 ENCOUNTER — Other Ambulatory Visit: Payer: Self-pay | Admitting: *Deleted

## 2011-03-30 ENCOUNTER — Ambulatory Visit (INDEPENDENT_AMBULATORY_CARE_PROVIDER_SITE_OTHER): Payer: Medicaid Other | Admitting: Internal Medicine

## 2011-03-30 ENCOUNTER — Encounter: Payer: Self-pay | Admitting: Internal Medicine

## 2011-03-30 DIAGNOSIS — M8080XA Other osteoporosis with current pathological fracture, unspecified site, initial encounter for fracture: Secondary | ICD-10-CM

## 2011-03-30 DIAGNOSIS — M8440XA Pathological fracture, unspecified site, initial encounter for fracture: Secondary | ICD-10-CM

## 2011-03-30 DIAGNOSIS — R0989 Other specified symptoms and signs involving the circulatory and respiratory systems: Secondary | ICD-10-CM

## 2011-03-30 MED ORDER — MOMETASONE FUROATE 50 MCG/ACT NA SUSP: 2.0000 | Freq: Every day | NASAL | Status: DC

## 2011-03-30 MED ORDER — DULOXETINE HCL 60 MG PO CPEP: 60.0000 mg | ORAL_CAPSULE | Freq: Every day | ORAL | Status: DC

## 2011-03-30 NOTE — Patient Instructions (Signed)
Lumbar Fracture  A fracture of a bone is the same as a break in the bone. A fracture in the lumbar area is a break that involves one of many parts that make up the 5 bones of the low back area. This is just above the pelvis.   CAUSES  Most of these injuries occur as a result of an accident such as:   A fall.   A car accident.   Recreational activities.   A smaller number occur due to:   Industrial, farm, and aviation accidents.   Gunshot wounds and direct blows to the back.   Parachuting incidents.  Most lumbar fractures affect the "building blocks" or the main portion of the spine known as the "vertebral bodies" (see the image on the right). A smaller number involve breaks to portions of bone that extend to the sides or backward behind the vertebral body. In the elderly, a sudden break can happen without an apparent cause. This is because the bones of the back have become extremely thin and fragile. This condition is known as osteoporosis.  SYMPTOMS  Patients with lumbar fractures have severe pain even if the actual break is small or limited, and there is no injury to nearby nerves. More severe or complex injuries involving other bones and/or organs may include:    Deformity of the back bones.   Swelling/bruising over the injured area.   Limited ability to move the affected area.   Partial or complete loss of function of the bladder and/or bowels. (This may be due to injury to nearby nerves).   More severe injuries can also cause:   Loss of sensation and/or strength in the legs, feet, and toes.   Paralysis.  DIAGNOSIS  In most cases, a lumbar fracture will be suspected by what happened just prior to the onset of back pain. X-rays and special imaging (CT scan and MRI imaging) are used to confirm the diagnosis as well as finding out the type and severity of the break or breaks. These tests guide treatment. But there are times when special imaging cannot be done. For example, MRI cannot be done if there  is an implanted metallic device (such as a pacemaker). In these cases, other tests and imaging are done.  If there has been nerve damage, more tests can be done. These include:   Tests of nerve function through muscles (nerve conduction studies and electromyography).   Tests of bladder function (urodynamics).   Tests that focus on defining specific nerve problems before surgery and what improvement has come about after surgery (evoked potentials).  TREATMENT  Common injuries may involve a small break off of the main surface of the back bone. Or they may be in the form of a partial flattening or compression of the bone. Hospital care may not be needed for these. Medicine for pain control, special back bracing, and limitations in activity are done first. Physical therapy follows later.  Complex breaks, multiple fractures of the spine, or unstable injuries can damage the spinal cord. They may require an operation to remove pressure from the nerves and/or spinal cord and to stabilize the broken pieces of bone. Each individual set of injuries is unique. The surgeon will take into consideration many things when planning the best surgical approach that will give the highest likelihood of a good outcome.   HOME CARE INSTRUCTIONS  There is pain and stiffness in the back for weeks after a vertebral fracture. Bed rest, pain medicine, and a   in reducing pain and increasing mobility. When your pain allows, simple walking will help to begin the process of returning to normal activities. Exercises to improve motion and to strengthen the back may also be useful after the initial pain goes away. This will be guided by your caregiver and the team (nurses, physical therapists, occupational therapists, etc.) involved with your ongoing care. For the elderly, treatment for osteoporosis may be needed to help reduce the risk of  fractures in the future. Arrange for follow-up care as recommended to assure proper long-term care and prevention of further spine injury. The failure to follow-up as recommended could result in permanent injury, disability, and a chronic painful condition. SEEK MEDICAL CARE IF:  Pain is not effectively controlled with medication.   You feel unable to decrease pain medication over time as planned.   Activity level is not improving as planned and/or expected.  SEEK IMMEDIATE MEDICAL CARE IF:  You have increasing pain, vomiting, or are unable to move around at all.   You have numbness, tingling, weakness, or paralysis of any part of your body.   You have loss of normal bowel or bladder control.   You have difficulty breathing, cough, fever, chest or abdominal pain.  Document Released: 07/29/2006 Document Revised: 12/24/2010 Document Reviewed: 03/29/2007 [facility] Patient Information 2012 [place], [place].

## 2011-03-30 NOTE — Assessment & Plan Note (Addendum)
Patient will continue on calcium and vitamin D. He will start with physical therapy tomorrow.  He was encouraged to take 1/2 pill of vicoprofen prior to PT sessions. Follow up in 2 months or earlier as needed.

## 2011-03-30 NOTE — Assessment & Plan Note (Signed)
[name] wife says that nasonex has worked in the past so i will go ahead and prescribe him that. She was asked to make sure that he does not have fever and chills and was asked to bring him in if he has either of those.

## 2011-03-30 NOTE — Progress Notes (Signed)
  Subjective:    Patient ID: Todd [name], male    DOB: 06/15/1945, 65 y.o.   MRN: [id number]  HPI Todd [name] is a 65 year old man with pmh as noted in the chart. He was recently admitted after a fall and was found to have a compression fracture in his spine. He is here today to follow up on his PT/OT. He is getting #40 tabs of vicoprofen a month and mainly using the tab at night. I had given him 120 tabs but the pharmacy only refilled 40 at a time as there were instructions for #40 per month.  Wife who is the caretaker complains that patient has been congested more recently and requests nasonex inhaler. No fever, chills, shortness of breath noted.   Review of Systems  Constitutional: Negative for fever, activity change and appetite change.  HENT: Negative for sore throat.   Respiratory: Positive for chest tightness. Negative for cough and shortness of breath.   Cardiovascular: Negative for chest pain and leg swelling.  Gastrointestinal: Negative for nausea, abdominal pain, diarrhea, constipation and abdominal distention.  Genitourinary: Negative for frequency, hematuria and difficulty urinating.  Musculoskeletal: Positive for back pain.  Neurological: Negative for dizziness and headaches.  Psychiatric/Behavioral: Negative for suicidal ideas and behavioral problems.       Objective:   Physical Exam  Constitutional: He appears well-developed and well-nourished.  HENT:  Head: Normocephalic.  Eyes: Right eye exhibits no discharge. Left eye exhibits no discharge. No scleral icterus.  Neck: Normal range of motion. Neck supple. No JVD present. No thyromegaly present.  Cardiovascular: Normal rate, regular rhythm, normal heart sounds and intact distal pulses.  Exam reveals no gallop and no friction rub.   No murmur heard. Pulmonary/Chest: Effort normal. No respiratory distress. He has no wheezes. He has rales (at left lung bases).  Abdominal: Soft. Bowel sounds are normal. He  exhibits no distension and no mass. There is no tenderness. There is no rebound and no guarding.  Musculoskeletal: Normal range of motion. He exhibits no edema and no tenderness.  Lymphadenopathy:    He has no cervical adenopathy.  Neurological: He is alert.  Psychiatric: He has a normal mood and affect. His behavior is normal.          Assessment & Plan:

## 2011-03-31 ENCOUNTER — Ambulatory Visit: Payer: Medicaid Other | Attending: Internal Medicine | Admitting: Physical Therapy

## 2011-03-31 DIAGNOSIS — IMO0001 Reserved for inherently not codable concepts without codable children: Secondary | ICD-10-CM | POA: Insufficient documentation

## 2011-03-31 DIAGNOSIS — R269 Unspecified abnormalities of gait and mobility: Secondary | ICD-10-CM | POA: Insufficient documentation

## 2011-03-31 DIAGNOSIS — M6281 Muscle weakness (generalized): Secondary | ICD-10-CM | POA: Insufficient documentation

## 2011-04-01 ENCOUNTER — Encounter: Payer: Medicaid Other | Admitting: Physical Therapy

## 2011-04-06 ENCOUNTER — Telehealth: Payer: Self-pay | Admitting: *Deleted

## 2011-04-06 NOTE — Telephone Encounter (Signed)
Call from nurse with [facility],  lannette  [phone]) She was out visiting today and BP was 156/96 - left arm  And 128/88 in right arm. He has taken his BP meds.  This is FYI, call if you have questions. She needed to report to PCP

## 2011-04-07 NOTE — Telephone Encounter (Signed)
Thank you.  Todd [name]

## 2011-04-10 ENCOUNTER — Other Ambulatory Visit: Payer: Self-pay | Admitting: Internal Medicine

## 2011-04-10 NOTE — Telephone Encounter (Signed)
Call to [facility] for Prior Authorization on Nasonex-denied pt will need to try Astepro, Astelin or Patanase.  Prior Authorization was approved for Nexium.  Nexium was approved for 1 year starting 04/10/2011 thru 04/09/2012.  Pt has been on Protonix and Prilosec previously.  Angelina Ok, Rn 04/10/2011 5:23 PM.

## 2011-04-13 ENCOUNTER — Ambulatory Visit: Payer: Medicaid Other | Admitting: Physical Therapy

## 2011-04-22 ENCOUNTER — Ambulatory Visit: Payer: Medicaid Other | Admitting: Physical Therapy

## 2011-04-23 ENCOUNTER — Other Ambulatory Visit: Payer: Self-pay | Admitting: Internal Medicine

## 2011-04-27 ENCOUNTER — Ambulatory Visit: Payer: Medicaid Other | Admitting: Physical Therapy

## 2011-04-29 ENCOUNTER — Other Ambulatory Visit: Payer: Self-pay | Admitting: Internal Medicine

## 2011-05-04 ENCOUNTER — Telehealth: Payer: Self-pay | Admitting: *Deleted

## 2011-05-04 MED ORDER — FLUTICASONE PROPIONATE 50 MCG/ACT NA SUSP: 2.0000 | Freq: Every day | NASAL | Status: DC

## 2011-05-04 NOTE — Telephone Encounter (Signed)
Spoke to patient's wife, she has already contacted insurance company herself and received an approval for the nexium.  Insurance will not pay for the flonase, but pt's wife states he can try flonase as it's on the preferred list.  Discussed with Dr Coralee Pesa, approval given to change the nasonex to flonase.  Rx sent to pharmacy and md for signature.Kingsley Spittle Cassady1/7/20134:24 PM

## 2011-05-05 ENCOUNTER — Ambulatory Visit: Payer: Medicaid Other | Admitting: Physical Therapy

## 2011-05-14 ENCOUNTER — Telehealth: Payer: Self-pay | Admitting: *Deleted

## 2011-05-14 NOTE — Telephone Encounter (Signed)
Call from pt's wife.  Read in 04/20/2011 article that talks about a new injection-Etanercept 25 mg.  The injection has been found to have remarkable results with pt's who had had Traumatic Brain Injuries.  She said that it was discovered by a doctor in [place].  Wife would like to discuss the possibility of her husband getting this injection.  Wife was told that the information would be passed on and a doctor should contact her.  Wife can be reached at [phone].  Angelina Ok, RN 05/15/2011 10:13 AM

## 2011-05-19 NOTE — Telephone Encounter (Signed)
I will read about the drug and the research associated with it and will get back to the patient's wife soon. meanwhlile we can set up an appointment next month to discuss the same. Thank you  Kierrah Kilbride

## 2011-05-19 NOTE — Telephone Encounter (Signed)
This can be discussed at next visit with PCP.

## 2011-06-08 ENCOUNTER — Encounter: Payer: Self-pay | Admitting: Internal Medicine

## 2011-06-08 ENCOUNTER — Ambulatory Visit (INDEPENDENT_AMBULATORY_CARE_PROVIDER_SITE_OTHER): Payer: Medicaid Other | Admitting: Internal Medicine

## 2011-06-08 DIAGNOSIS — M8440XA Pathological fracture, unspecified site, initial encounter for fracture: Secondary | ICD-10-CM

## 2011-06-08 DIAGNOSIS — Z299 Encounter for prophylactic measures, unspecified: Secondary | ICD-10-CM

## 2011-06-08 DIAGNOSIS — Z87820 Personal history of traumatic brain injury: Secondary | ICD-10-CM

## 2011-06-08 DIAGNOSIS — M8080XA Other osteoporosis with current pathological fracture, unspecified site, initial encounter for fracture: Secondary | ICD-10-CM

## 2011-06-08 MED ORDER — HYDROCODONE-IBUPROFEN 5-200 MG PO TABS: 1.0000 | ORAL_TABLET | Freq: Every evening | ORAL | Status: DC | PRN

## 2011-06-08 MED ORDER — TRAMADOL HCL 50 MG PO TABS: 50.0000 mg | ORAL_TABLET | Freq: Four times a day (QID) | ORAL | Status: DC | PRN

## 2011-06-08 NOTE — Patient Instructions (Signed)
Narcotic Withdrawal If you take narcotic drugs for a long time, you may become dependent on them. Stopping these medicines suddenly can cause physical symptoms of withdrawal. Narcotics include opiate prescription pain medicines and heroin. Commonly prescribed narcotics include codeine, hydrocodone, oxycodone, methadone, and morphine. SYMPTOMS  Narcotics tend to slow down body and mental function. When you quit taking narcotics, your body and mind are stimulated. Some withdrawal symptoms include:  Irritability.   Anxiety.   Runny nose.   "Goose flesh."   Diarrhea.   Nausea.   Muscle spasms.   Sleeplessness.   Chills.   Sweats.   Drug cravings.   Confusion.  Withdrawal symptoms are troubling. The severity depends on:  Your body's make up.   The amount of drugs you used.   The length of time you used them.  You may be at greater risk of having twitching and shaking (seizure) during the first several days of withdrawal from sedative drugs, including narcotics. However, opiate withdrawal rarely causes a seizure. Withdrawal is uncomfortable, but it is not life-threatening for adults unless there is a medical complication, such as heart disease. HOME CARE INSTRUCTIONS   Drink fluids, get plenty of rest, and take hot baths.   Medicines may be prescribed to help control withdrawal symptoms.   Over-the-counter medicines may be helpful to control diarrhea or an upset stomach.   If your problems resulted from taking prescription pain medicines, make sure you have a follow-up visit with your caregiver within the next few days. Be open about this problem.   If you are dependent or addicted to street drugs, contact a local drug and alcohol treatment center or Narcotics Anonymous.   Have someone with you to monitor your symptoms.   Engage in healthy activities with friends who do not use drugs.   Stay away from the drug scene.  SEEK IMMEDIATE MEDICAL CARE IF:   You have  vomiting that cannot be controlled, especially if you cannot keep liquids down.   You are seeing things or hearing voices that are not really there (hallucinating).   You have a seizure.  Document Released: 05/21/2004 Document Revised: 12/24/2010 Document Reviewed: 09/13/2009 [facility] Patient Information 2012 [place], [place].
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
## 2011-06-09 ENCOUNTER — Telehealth: Payer: Self-pay | Admitting: *Deleted

## 2011-06-10 ENCOUNTER — Other Ambulatory Visit: Payer: Self-pay | Admitting: *Deleted

## 2011-06-10 ENCOUNTER — Other Ambulatory Visit: Payer: Self-pay | Admitting: Internal Medicine

## 2011-06-10 DIAGNOSIS — W19XXXA Unspecified fall, initial encounter: Secondary | ICD-10-CM

## 2011-06-10 MED ORDER — HYDROCODONE-IBUPROFEN 7.5-200 MG PO TABS: 1.0000 | ORAL_TABLET | ORAL | Status: DC | PRN

## 2011-06-10 NOTE — Telephone Encounter (Signed)
I will change it to 7.5 mg. Thank you and sorry for the confusion.

## 2011-06-10 NOTE — Telephone Encounter (Signed)
Close encounter 

## 2011-06-10 NOTE — Progress Notes (Signed)
Vicopren 7.5/200mg  rx #120 called to [facility] pharmacy.

## 2011-06-10 NOTE — Telephone Encounter (Signed)
The pharmacist called; Vicoprofen dosage comes in 7.5/200mg  not 5/200mg . Please change.  Thanks

## 2011-06-15 NOTE — Assessment & Plan Note (Signed)
We discussed in detail about the pros and cons of intrathecal etanercept. Given the lack of data for benefit of etanercept in TBI patients we decided not to proceed with the treatment.

## 2011-06-15 NOTE — Assessment & Plan Note (Signed)
I prescribed him Vicoprofen 7.5 mg to be used as needed for pain.

## 2011-06-29 ENCOUNTER — Other Ambulatory Visit: Payer: Self-pay | Admitting: Internal Medicine

## 2011-07-06 ENCOUNTER — Other Ambulatory Visit: Payer: Self-pay | Admitting: Internal Medicine

## 2011-07-10 ENCOUNTER — Other Ambulatory Visit: Payer: Self-pay | Admitting: Internal Medicine

## 2011-07-15 ENCOUNTER — Telehealth: Payer: Self-pay | Admitting: *Deleted

## 2011-07-15 NOTE — Telephone Encounter (Signed)
Pt's spouse calls and states that pt has "2 boils" on back, states she has been trying to treat them with no success in fact maybe getting worse. appt given for 3/22 at 1545 dr Anselm Jungling

## 2011-07-16 ENCOUNTER — Ambulatory Visit: Payer: Medicaid Other | Admitting: Internal Medicine

## 2011-07-17 ENCOUNTER — Ambulatory Visit (INDEPENDENT_AMBULATORY_CARE_PROVIDER_SITE_OTHER): Payer: Medicaid Other | Admitting: Internal Medicine

## 2011-07-17 ENCOUNTER — Encounter: Payer: Self-pay | Admitting: Internal Medicine

## 2011-07-17 VITALS — BP 104/70 | HR 83 | Temp 97.7°F | Wt 200.4 lb

## 2011-07-17 DIAGNOSIS — L02222 Furuncle of back [any part, except buttock]: Secondary | ICD-10-CM

## 2011-07-17 DIAGNOSIS — M8080XA Other osteoporosis with current pathological fracture, unspecified site, initial encounter for fracture: Secondary | ICD-10-CM

## 2011-07-17 DIAGNOSIS — M8440XA Pathological fracture, unspecified site, initial encounter for fracture: Secondary | ICD-10-CM

## 2011-07-17 DIAGNOSIS — L02229 Furuncle of trunk, unspecified: Secondary | ICD-10-CM

## 2011-07-17 MED ORDER — DOXYCYCLINE HYCLATE 100 MG PO CAPS: 100.0000 mg | ORAL_CAPSULE | Freq: Two times a day (BID) | ORAL | Status: AC

## 2011-07-17 MED ORDER — MORPHINE SULFATE CR 15 MG PO TB12: 15.0000 mg | ORAL_TABLET | Freq: Every evening | ORAL | Status: AC | PRN

## 2011-07-17 NOTE — Assessment & Plan Note (Addendum)
Side effect from Vicodin: severe pruritis.  He is not able to take combination pills that have ibuprofen and tylenol which are part of Vicodin or Percocet. -Will try MS Contin at bed time PRN to see if he will tolerate and this may last him throughout the night to control his pain.  In the future, we may consider the shorter acting MS IR if the MS Contin lasts too long. -Consider DEXA scan and adding Calcium +/ bisphosphonate (although he does have a history of PUD) given his multiple falls  Case discussed with attending, Dr. Meredith Pel
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
## 2011-07-17 NOTE — Patient Instructions (Signed)
Stop taking Vicodin Start taking MS Contin 15mg  one tablet at bedtime Start taking Doxycycline 100mg  one tablet twice daily with food x 14 days Warm compress 30 minutes at least 3 times daily Follow up in 2 weeks or sooner if symptoms worsen

## 2011-07-17 NOTE — Assessment & Plan Note (Addendum)
Multiple boils on back but no abscess appreciated; therefore, I&D not indicated Will treat with Doxycycline 100mg  po bid with food x 14 days Warm compress apply 30 minutes 3 times daily Return in 2 weeks or sooner if worsen, may need to I&D if the boils develop into abscesses

## 2011-07-30 ENCOUNTER — Telehealth: Payer: Self-pay | Admitting: *Deleted

## 2011-07-30 NOTE — Telephone Encounter (Signed)
Pt's wife calls and states boil on back is worse, much bigger, multiple appts offered refused for number of reasons, suggested urg care, gave her the ph# and suggested to call before going, she is agreeable

## 2011-07-31 ENCOUNTER — Other Ambulatory Visit: Payer: Self-pay | Admitting: Internal Medicine

## 2011-07-31 NOTE — Telephone Encounter (Signed)
Thank you.  Todd [name] 

## 2011-08-03 ENCOUNTER — Encounter: Payer: Medicaid Other | Admitting: Internal Medicine

## 2011-08-08 ENCOUNTER — Inpatient Hospital Stay (HOSPITAL_COMMUNITY)
Admission: EM | Admit: 2011-08-08 | Discharge: 2011-08-12 | DRG: 690 | Disposition: A | Discharge status: Home / Self Care | Payer: Medicaid Other | Attending: Internal Medicine | Admitting: Internal Medicine

## 2011-08-08 ENCOUNTER — Encounter (HOSPITAL_COMMUNITY): Payer: Self-pay | Admitting: *Deleted

## 2011-08-08 ENCOUNTER — Emergency Department (HOSPITAL_COMMUNITY): Payer: Medicaid Other

## 2011-08-08 DIAGNOSIS — Z7982 Long term (current) use of aspirin: Secondary | ICD-10-CM

## 2011-08-08 DIAGNOSIS — I10 Essential (primary) hypertension: Secondary | ICD-10-CM | POA: Diagnosis present

## 2011-08-08 DIAGNOSIS — Z85828 Personal history of other malignant neoplasm of skin: Secondary | ICD-10-CM

## 2011-08-08 DIAGNOSIS — R4182 Altered mental status, unspecified: Secondary | ICD-10-CM

## 2011-08-08 DIAGNOSIS — L03319 Cellulitis of trunk, unspecified: Secondary | ICD-10-CM | POA: Diagnosis present

## 2011-08-08 DIAGNOSIS — Z7902 Long term (current) use of antithrombotics/antiplatelets: Secondary | ICD-10-CM

## 2011-08-08 DIAGNOSIS — Z87820 Personal history of traumatic brain injury: Secondary | ICD-10-CM

## 2011-08-08 DIAGNOSIS — L02219 Cutaneous abscess of trunk, unspecified: Secondary | ICD-10-CM | POA: Diagnosis present

## 2011-08-08 DIAGNOSIS — L02222 Furuncle of back [any part, except buttock]: Secondary | ICD-10-CM | POA: Diagnosis present

## 2011-08-08 DIAGNOSIS — K219 Gastro-esophageal reflux disease without esophagitis: Secondary | ICD-10-CM | POA: Diagnosis present

## 2011-08-08 DIAGNOSIS — F3289 Other specified depressive episodes: Secondary | ICD-10-CM | POA: Diagnosis present

## 2011-08-08 DIAGNOSIS — Z8673 Personal history of transient ischemic attack (TIA), and cerebral infarction without residual deficits: Secondary | ICD-10-CM

## 2011-08-08 DIAGNOSIS — N390 Urinary tract infection, site not specified: Principal | ICD-10-CM | POA: Diagnosis present

## 2011-08-08 DIAGNOSIS — K279 Peptic ulcer, site unspecified, unspecified as acute or chronic, without hemorrhage or perforation: Secondary | ICD-10-CM | POA: Diagnosis present

## 2011-08-08 DIAGNOSIS — F329 Major depressive disorder, single episode, unspecified: Secondary | ICD-10-CM | POA: Diagnosis present

## 2011-08-08 DIAGNOSIS — R32 Unspecified urinary incontinence: Secondary | ICD-10-CM | POA: Diagnosis present

## 2011-08-08 DIAGNOSIS — B1920 Unspecified viral hepatitis C without hepatic coma: Secondary | ICD-10-CM | POA: Diagnosis present

## 2011-08-08 HISTORY — DX: Full incontinence of feces: R15.9

## 2011-08-08 HISTORY — DX: Dorsalgia, unspecified: M54.9

## 2011-08-08 HISTORY — DX: Unspecified convulsions: R56.9

## 2011-08-08 HISTORY — DX: Unspecified injury of lower back, initial encounter: S39.92XA

## 2011-08-08 LAB — URINALYSIS, ROUTINE W REFLEX MICROSCOPIC
Bilirubin Urine: NEGATIVE
Ketones, ur: NEGATIVE mg/dL
Nitrite: NEGATIVE
Specific Gravity, Urine: 1.023 (ref 1.005–1.030)
Urobilinogen, UA: 1 mg/dL (ref 0.0–1.0)

## 2011-08-08 LAB — PROTIME-INR
INR: 1.18 (ref 0.00–1.49)
Prothrombin Time: 15.2 seconds (ref 11.6–15.2)

## 2011-08-08 LAB — CBC
Hemoglobin: 13.7 g/dL (ref 13.0–17.0)
Platelets: 217 10*3/uL (ref 150–400)
RBC: 4.13 MIL/uL — ABNORMAL LOW (ref 4.22–5.81)

## 2011-08-08 LAB — GLUCOSE, CAPILLARY: Glucose-Capillary: 115 mg/dL — ABNORMAL HIGH (ref 70–99)

## 2011-08-08 LAB — BASIC METABOLIC PANEL
CO2: 25 mEq/L (ref 19–32)
GFR calc non Af Amer: 89 mL/min — ABNORMAL LOW (ref >90)
Glucose, Bld: 123 mg/dL — ABNORMAL HIGH (ref 70–99)
Potassium: 4 mEq/L (ref 3.5–5.1)
Sodium: 141 mEq/L (ref 135–145)

## 2011-08-08 LAB — APTT: aPTT: 33 seconds (ref 24–37)

## 2011-08-08 LAB — CARDIAC PANEL(CRET KIN+CKTOT+MB+TROPI): Relative Index: INVALID (ref 0.0–2.5)

## 2011-08-08 MED ORDER — DOCUSATE SODIUM 100 MG PO CAPS
100.0000 mg | ORAL_CAPSULE | Freq: Every day | ORAL | Status: DC
  Administered 2011-08-10 – 2011-08-12 (×3): 100 mg via ORAL
  Filled 2011-08-08 (×3): qty 1

## 2011-08-08 MED ORDER — CLOPIDOGREL BISULFATE 75 MG PO TABS
75.0000 mg | ORAL_TABLET | Freq: Every day | ORAL | Status: DC
  Administered 2011-08-10 – 2011-08-12 (×3): 75 mg via ORAL
  Filled 2011-08-08 (×4): qty 1

## 2011-08-08 MED ORDER — ONDANSETRON HCL 4 MG/2ML IJ SOLN: 4.0000 mg | Freq: Four times a day (QID) | INTRAMUSCULAR | Status: DC | PRN

## 2011-08-08 MED ORDER — PANTOPRAZOLE SODIUM 40 MG PO TBEC
40.0000 mg | DELAYED_RELEASE_TABLET | Freq: Every day | ORAL | Status: DC
  Administered 2011-08-10 – 2011-08-12 (×3): 40 mg via ORAL
  Filled 2011-08-08 (×3): qty 1

## 2011-08-08 MED ORDER — ENOXAPARIN SODIUM 40 MG/0.4ML ~~LOC~~ SOLN
40.0000 mg | Freq: Every day | SUBCUTANEOUS | Status: DC
  Administered 2011-08-09 – 2011-08-11 (×3): 40 mg via SUBCUTANEOUS
  Filled 2011-08-08 (×4): qty 0.4

## 2011-08-08 MED ORDER — METOPROLOL TARTRATE 25 MG PO TABS
25.0000 mg | ORAL_TABLET | Freq: Two times a day (BID) | ORAL | Status: DC
  Administered 2011-08-09 – 2011-08-12 (×6): 25 mg via ORAL
  Filled 2011-08-08 (×9): qty 1

## 2011-08-08 MED ORDER — SODIUM CHLORIDE 0.9 % IV SOLN: INTRAVENOUS | Status: DC

## 2011-08-08 MED ORDER — DOXYCYCLINE HYCLATE 100 MG IV SOLR
100.0000 mg | Freq: Two times a day (BID) | INTRAVENOUS | Status: DC
  Administered 2011-08-09 – 2011-08-12 (×8): 100 mg via INTRAVENOUS
  Filled 2011-08-08 (×9): qty 100

## 2011-08-08 MED ORDER — MORPHINE SULFATE 4 MG/ML IJ SOLN
4.0000 mg | INTRAMUSCULAR | Status: DC | PRN
  Administered 2011-08-10: 4 mg via INTRAVENOUS
  Filled 2011-08-08: qty 1

## 2011-08-08 MED ORDER — ASPIRIN 81 MG PO TBEC: 81.0000 mg | DELAYED_RELEASE_TABLET | Freq: Every day | ORAL | Status: DC

## 2011-08-08 MED ORDER — ASPIRIN EC 81 MG PO TBEC
81.0000 mg | DELAYED_RELEASE_TABLET | Freq: Every day | ORAL | Status: DC
  Administered 2011-08-10 – 2011-08-12 (×3): 81 mg via ORAL
  Filled 2011-08-08 (×4): qty 1

## 2011-08-08 MED ORDER — ONDANSETRON HCL 4 MG PO TABS: 4.0000 mg | ORAL_TABLET | Freq: Four times a day (QID) | ORAL | Status: DC | PRN

## 2011-08-08 MED ORDER — BUDESONIDE-FORMOTEROL FUMARATE 80-4.5 MCG/ACT IN AERO
2.0000 | INHALATION_SPRAY | Freq: Two times a day (BID) | RESPIRATORY_TRACT | Status: DC
  Administered 2011-08-10 – 2011-08-12 (×3): 2 via RESPIRATORY_TRACT
  Filled 2011-08-08 (×2): qty 6.9

## 2011-08-08 MED ORDER — DEXTROSE 5 % IV SOLN
1.0000 g | Freq: Every day | INTRAVENOUS | Status: DC
  Administered 2011-08-09 – 2011-08-11 (×4): 1 g via INTRAVENOUS
  Filled 2011-08-08 (×4): qty 10

## 2011-08-08 MED ORDER — DULOXETINE HCL 60 MG PO CPEP
60.0000 mg | ORAL_CAPSULE | Freq: Every day | ORAL | Status: DC
  Administered 2011-08-10 – 2011-08-12 (×3): 60 mg via ORAL
  Filled 2011-08-08 (×4): qty 1

## 2011-08-08 MED ORDER — FLUTICASONE PROPIONATE 50 MCG/ACT NA SUSP
2.0000 | Freq: Every day | NASAL | Status: DC
  Administered 2011-08-09 – 2011-08-12 (×4): 2 via NASAL
  Filled 2011-08-08 (×2): qty 16

## 2011-08-08 MED ORDER — SODIUM CHLORIDE 0.9 % IV SOLN
INTRAVENOUS | Status: DC
  Administered 2011-08-08 – 2011-08-10 (×3): via INTRAVENOUS

## 2011-08-08 MED ORDER — ALBUTEROL SULFATE (5 MG/ML) 0.5% IN NEBU: 2.5000 mg | INHALATION_SOLUTION | RESPIRATORY_TRACT | Status: DC | PRN
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
## 2011-08-08 NOTE — ED Notes (Signed)
Last seen normal 4/12 before bedtime.

## 2011-08-08 NOTE — ED Notes (Signed)
Failed swallow evaluation. Dr. Eather Colas. Wife states, "pt. Is to get thickened but refuses too." Dr. Andrey Cota, "do not give any liquids/food.

## 2011-08-08 NOTE — ED Notes (Signed)
Woke up this am with generalized weakness. Can ambulate assistance with walker. Previous tia (rt. Sided weakness). Lt. Leg more shaky than normal. Alert and oriented. Speech wnl. 118/62. Hr 96. sao2 98 RA/ cbg/ 85.

## 2011-08-08 NOTE — ED Provider Notes (Signed)
Medical screening examination/treatment/procedure(s) were conducted as a shared visit with non-physician practitioner(s) and myself.  I personally evaluated the patient during the encounter  Cheri Guppy, MD 08/08/11 1537

## 2011-08-08 NOTE — ED Notes (Signed)
To shower - COULDN'T follow any directions, blank stare (~30 min.) - hx. Of seizure r/t post traumatic injury (2008).

## 2011-08-08 NOTE — ED Notes (Signed)
Attempted to call report. RN unable to take at this time. 
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
## 2011-08-08 NOTE — ED Notes (Signed)
Update: with states, "more incontinent, and being treated for abscess.".

## 2011-08-09 ENCOUNTER — Encounter (HOSPITAL_COMMUNITY): Payer: Self-pay

## 2011-08-09 DIAGNOSIS — L02219 Cutaneous abscess of trunk, unspecified: Secondary | ICD-10-CM

## 2011-08-09 DIAGNOSIS — R4182 Altered mental status, unspecified: Secondary | ICD-10-CM

## 2011-08-09 LAB — BASIC METABOLIC PANEL
Calcium: 8.7 mg/dL (ref 8.4–10.5)
GFR calc Af Amer: >90 mL/min (ref >90)
GFR calc non Af Amer: >90 mL/min (ref >90)
Sodium: 142 mEq/L (ref 135–145)

## 2011-08-09 LAB — GLUCOSE, CAPILLARY: Glucose-Capillary: 106 mg/dL — ABNORMAL HIGH (ref 70–99)

## 2011-08-09 LAB — CBC
Platelets: 196 10*3/uL (ref 150–400)
RBC: 3.81 MIL/uL — ABNORMAL LOW (ref 4.22–5.81)
WBC: 17.4 10*3/uL — ABNORMAL HIGH (ref 4.0–10.5)

## 2011-08-09 MED ORDER — WHITE PETROLATUM GEL
Status: AC
  Administered 2011-08-09: 10:00:00
  Filled 2011-08-09: qty 5
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
## 2011-08-09 NOTE — Progress Notes (Signed)
Subjective: No acute events overnight.  [name] mental status has improved this morning.  Patient failed swallow study this morning, but I had an extensive conversation with the patient's wife, indicating that she valued QOL over risk of aspiration PNA, and that they have chosen to let the patient eat whatever he likes over the last >10 years that this has been an issue.  Objective: Vital signs in last 24 hours: Filed Vitals:   08/08/11 2025 08/08/11 2333 08/09/11 0618 08/09/11 1200  BP: 108/58 118/70 116/66 132/80  Pulse: 86 79 84 73  Temp: 98.5 F (36.9 C) 98.9 F (37.2 C) 98.4 F (36.9 C) 97.9 F (36.6 C)  TempSrc: Oral Oral Oral Oral  Resp: 18 18 20 18   SpO2: 93% 91% 94% 98%   Weight change:   Intake/Output Summary (Last 24 hours) at 08/09/11 1442 Last data filed at 08/09/11 0936  Gross per 24 hour  Intake    250 ml  Output   1350 ml  Net  -1100 ml   Physical Exam: General: cooperative, follows commands, appears confused HEENT: pupils equal round and reactive to light, vision grossly intact, oropharynx clear and non-erythematous, chronic right face partial paralysis Neck: supple, no lymphadenopathy Lungs: clear to ascultation bilaterally, normal work of respiration, no wheezes, rales, ronchi Heart: regular rate and rhythm, no murmurs, gallops, or rubs Abdomen: soft, non-tender, non-distended, normal bowel sounds  Back: 5x5 cm area of violacious edema, with central fluctuance, and central punctum (reported site of spontaneous drainage)  Extremities: no cyanosis, clubbing, or edema Neurologic: patient can give minimal verbal responses, can follow commands, moves all 4 extremities spontaneously.   Lab Results: Basic Metabolic Panel:  Lab 08/09/11 1610 08/08/11 1248  NA 142 141  K 3.6 4.0  CL 111 106  CO2 22 25  GLUCOSE 112* 123*  BUN 15 18  CREATININE 0.74 0.90  CALCIUM 8.7 9.0  MG -- --  PHOS -- --   CBC:  Lab 08/09/11 0600 08/08/11 1248  WBC 17.4* 19.2*    NEUTROABS -- --  HGB 12.5* 13.7  HCT 37.5* 41.0  MCV 98.4 99.3  PLT 196 217   Cardiac Enzymes:  Lab 08/08/11 1248  CKTOTAL 52  CKMB 1.2  CKMBINDEX --  TROPONINI <0.30   CBG:  Lab 08/09/11 1145 08/09/11 0732 08/09/11 0003 08/08/11 1309  GLUCAP 102* 103* 106* 115*   Coagulation:  Lab 08/08/11 1248  LABPROT 15.2  INR 1.18   Urine Drug Screen: Drugs of Abuse     Component Value Date/Time   LABOPIA POSITIVE* 10/18/2009 1500   COCAINSCRNUR NONE DETECTED 10/18/2009 1500   LABBENZ NONE DETECTED 10/18/2009 1500   AMPHETMU NONE DETECTED 10/18/2009 1500   THCU NONE DETECTED 10/18/2009 1500   LABBARB  Value: NONE DETECTED        DRUG SCREEN FOR MEDICAL PURPOSES ONLY.  IF CONFIRMATION IS NEEDED FOR ANY PURPOSE, NOTIFY LAB WITHIN 5 DAYS.        LOWEST DETECTABLE LIMITS FOR URINE DRUG SCREEN Drug Class       Cutoff (ng/mL) Amphetamine      1000 Barbiturate      200 Benzodiazepine   200 Tricyclics       300 Opiates          300 Cocaine          300 THC              50 10/18/2009 1500    Urinalysis:  Lab 08/08/11 1504  COLORURINE AMBER*  LABSPEC 1.023  PHURINE 8.5*  GLUCOSEU NEGATIVE  HGBUR NEGATIVE  BILIRUBINUR NEGATIVE  KETONESUR NEGATIVE  PROTEINUR 30*  UROBILINOGEN 1.0  NITRITE NEGATIVE  LEUKOCYTESUR MODERATE*    Micro Results: Recent Results (from the past 240 hour(s))  WOUND CULTURE     Status: Normal (Preliminary result)   Collection Time   08/08/11  4:58 PM      Component Value Range Status Comment   Specimen Description ABSCESS BACK   Final    Special Requests NONE   Final    Gram Stain PENDING   Incomplete    Culture Culture reincubated for better growth   Final    Report Status PENDING   Incomplete    Studies/Results: Ct Head Wo Contrast  08/08/2011  *RADIOLOGY REPORT*  Clinical Data: Ataxia and altered mental status.  CT HEAD WITHOUT CONTRAST  Technique:  Contiguous axial images were obtained from the base of the skull through the vertex without contrast.   Comparison: 10/18/2009 MRI and CT  Findings: Bifrontal and right temporal encephalomalacia is again noted. Mild atrophy and chronic small vessel white matter ischemic changes are again noted.  No acute intracranial abnormalities are identified, including mass lesion or mass effect, hydrocephalus, extra-axial fluid collection, midline shift, hemorrhage, or acute infarction.  The visualized bony calvarium is unremarkable except for prior right frontotemporal craniectomy.  IMPRESSION: No evidence of acute intracranial abnormality.  Bifrontal and right temporal encephalomalacia.  Atrophy and chronic small vessel white matter ischemic changes.  Original Report Authenticated By: Rosendo Gros, M.D.   Dg Chest Portable 1 View  08/08/2011  *RADIOLOGY REPORT*  Clinical Data: Weak.  Altered mental status.  PORTABLE CHEST - 1 VIEW  Comparison: Two-view chest 10/20/2010.  Findings: The lung volumes are low.  This exaggerates the heart size.  Minimal bibasilar atelectasis is present.  The lungs are otherwise clear.  The visualized soft tissues and bony thorax are unremarkable.  IMPRESSION:  1.  Low lung volumes. 2.  Mild bibasilar airspace disease.  This likely reflects atelectasis.  Early infection is not excluded.  Original Report Authenticated By: Jamesetta Orleans. MATTERN, M.D.   Medications: I have reviewed the patient's current medications. Scheduled Meds:   . aspirin EC  81 mg Oral Daily  . budesonide-formoterol  2 puff Inhalation BID  . cefTRIAXone (ROCEPHIN)  IV  1 g Intravenous QHS  . clopidogrel  75 mg Oral Q breakfast  . docusate sodium  100 mg Oral Daily  . doxycycline (VIBRAMYCIN) IV  100 mg Intravenous BID  . DULoxetine  60 mg Oral Daily  . enoxaparin  40 mg Subcutaneous QHS  . fluticasone  2 spray Each Nare Daily  . metoprolol tartrate  25 mg Oral BID  . pantoprazole  40 mg Oral Daily  . white petrolatum      . DISCONTD: aspirin  81 mg Oral Daily   Continuous Infusions:   . sodium chloride  125 mL/hr at 08/08/11 2300  . DISCONTD: sodium chloride Stopped (08/08/11 1841)   PRN Meds:.albuterol, morphine, ondansetron (ZOFRAN) IV, ondansetron  Assessment/Plan: The patient is a 66 yo man, history of TBI and urinary incontinence, presenting with AMS.   # AMS - 2-3 day history of AMS and oliguria, with UA concerning for UTI. AMS likely secondary to UTI vs other infectious source (ie back abscess).  AMS significantly improved this morning. -continue ceftriaxone -awaiting urine culture results with sensitivities -IVF   # Back abscess - present x3 months, treated with  hot pads and doxycycline x2 weeks (starting 3/22)  -s/p I&D on 4/13, produced about 1 tablespoon of blood and pus  -sent for wound culture  -morphine for pain   # HTN - on home metoprolol  -continue metoprolol   # Prior CVA  -continue Plavix   # PUD  -continue protonix   # Prophy - lovenox   LOS: 1 day   Janalyn Harder 08/09/2011, 2:42 PM

## 2011-08-10 LAB — GLUCOSE, CAPILLARY
Glucose-Capillary: 188 mg/dL — ABNORMAL HIGH (ref 70–99)
Glucose-Capillary: 103 mg/dL — ABNORMAL HIGH (ref 70–99)
Glucose-Capillary: 142 mg/dL — ABNORMAL HIGH (ref 70–99)

## 2011-08-10 LAB — BASIC METABOLIC PANEL
BUN: 11 mg/dL (ref 6–23)
CO2: 25 mEq/L (ref 19–32)
Calcium: 8.7 mg/dL (ref 8.4–10.5)
Chloride: 107 mEq/L (ref 96–112)
Creatinine, Ser: 0.85 mg/dL (ref 0.50–1.35)
Glucose, Bld: 100 mg/dL — ABNORMAL HIGH (ref 70–99)

## 2011-08-10 LAB — CBC
HCT: 39.6 % (ref 39.0–52.0)
MCH: 32.6 pg (ref 26.0–34.0)
MCV: 99.2 fL (ref 78.0–100.0)
Platelets: 189 10*3/uL (ref 150–400)
RBC: 3.99 MIL/uL — ABNORMAL LOW (ref 4.22–5.81)
WBC: 12.4 10*3/uL — ABNORMAL HIGH (ref 4.0–10.5)

## 2011-08-10 LAB — URINE CULTURE
Colony Count: >=100000
Culture  Setup Time: 201304132045

## 2011-08-10 NOTE — Progress Notes (Signed)
Subjective: No acute events overnight.  [name] mental status continues to improve, and he is A&O x3 this morning.  Urine culture shows proteus species, awaiting sensitivities.  Objective: Vital signs in last 24 hours: Filed Vitals:   08/09/11 2322 08/10/11 0345 08/10/11 0500 08/10/11 0745  BP: 114/76 135/81    Pulse: 99 72    Temp: 98.4 F (36.9 C) 98.6 F (37 C)    TempSrc: Oral Oral    Resp: 18 20    Height:    6' (1.829 m)  Weight:   199 lb 11.8 oz (90.6 kg)   SpO2: 95% 99%     Weight change:   Intake/Output Summary (Last 24 hours) at 08/10/11 0837 Last data filed at 08/09/11 1700  Gross per 24 hour  Intake    250 ml  Output   1350 ml  Net  -1100 ml   Physical Exam: General: alert, cooperative, NAD HEENT: pupils equal round and reactive to light, vision grossly intact, oropharynx clear and non-erythematous, chronic right face partial paralysis Neck: supple, no lymphadenopathy Lungs: clear to ascultation bilaterally, normal work of respiration, no wheezes, rales, ronchi Heart: regular rate and rhythm, no murmurs, gallops, or rubs Abdomen: soft, non-tender, non-distended, normal bowel sounds  Back: 5x5 cm area of violacious edema, with central fluctuance, and central punctum (reported site of spontaneous drainage)  Extremities: no cyanosis, clubbing, or edema Neurologic: A&O x3, responds appropriately to questions, follows commands, moves all 4 extremities spontaneously   Lab Results: Basic Metabolic Panel:  Lab 08/10/11 0454 08/09/11 0600  NA 138 142  K 3.8 3.6  CL 107 111  CO2 25 22  GLUCOSE 100* 112*  BUN 11 15  CREATININE 0.85 0.74  CALCIUM 8.7 8.7  MG -- --  PHOS -- --   CBC:  Lab 08/10/11 0638 08/09/11 0600  WBC 12.4* 17.4*  NEUTROABS -- --  HGB 13.0 12.5*  HCT 39.6 37.5*  MCV 99.2 98.4  PLT 189 196   Cardiac Enzymes:  Lab 08/08/11 1248  CKTOTAL 52  CKMB 1.2  CKMBINDEX --  TROPONINI <0.30   CBG:  Lab 08/10/11 0738 08/09/11 2211  08/09/11 1619 08/09/11 1145 08/09/11 0732 08/09/11 0003  GLUCAP 107* 125* 105* 102* 103* 106*   Coagulation:  Lab 08/08/11 1248  LABPROT 15.2  INR 1.18   Urine Drug Screen: Drugs of Abuse     Component Value Date/Time   LABOPIA POSITIVE* 10/18/2009 1500   COCAINSCRNUR NONE DETECTED 10/18/2009 1500   LABBENZ NONE DETECTED 10/18/2009 1500   AMPHETMU NONE DETECTED 10/18/2009 1500   THCU NONE DETECTED 10/18/2009 1500   LABBARB  Value: NONE DETECTED        DRUG SCREEN FOR MEDICAL PURPOSES ONLY.  IF CONFIRMATION IS NEEDED FOR ANY PURPOSE, NOTIFY LAB WITHIN 5 DAYS.        LOWEST DETECTABLE LIMITS FOR URINE DRUG SCREEN Drug Class       Cutoff (ng/mL) Amphetamine      1000 Barbiturate      200 Benzodiazepine   200 Tricyclics       300 Opiates          300 Cocaine          300 THC              50 10/18/2009 1500    Urinalysis:  Lab 08/08/11 1504  COLORURINE AMBER*  LABSPEC 1.023  PHURINE 8.5*  GLUCOSEU NEGATIVE  HGBUR NEGATIVE  BILIRUBINUR NEGATIVE  KETONESUR NEGATIVE  PROTEINUR  30*  UROBILINOGEN 1.0  NITRITE NEGATIVE  LEUKOCYTESUR MODERATE*    Micro Results: Recent Results (from the past 240 hour(s))  URINE CULTURE     Status: Normal (Preliminary result)   Collection Time   08/08/11  4:22 PM      Component Value Range Status Comment   Specimen Description URINE, CLEAN CATCH   Final    Special Requests ADDED ON 08/08/11 AT 1700   Final    Culture  Setup Time [id number]   Final    Colony Count >=100,000 COLONIES/ML   Final    Culture     Final    Value: PROTEUS SPECIES Culture reincubated for better growth   Report Status PENDING   Incomplete   WOUND CULTURE     Status: Normal (Preliminary result)   Collection Time   08/08/11  4:58 PM      Component Value Range Status Comment   Specimen Description ABSCESS BACK   Final    Special Requests NONE   Final    Gram Stain     Final    Value: MODERATE WBC PRESENT, PREDOMINANTLY PMN     NO SQUAMOUS EPITHELIAL CELLS SEEN     NO  ORGANISMS SEEN   Culture FEW PROTEUS MIRABILIS   Final    Report Status PENDING   Incomplete    Studies/Results: Ct Head Wo Contrast  08/08/2011  *RADIOLOGY REPORT*  Clinical Data: Ataxia and altered mental status.  CT HEAD WITHOUT CONTRAST  Technique:  Contiguous axial images were obtained from the base of the skull through the vertex without contrast.  Comparison: 10/18/2009 MRI and CT  Findings: Bifrontal and right temporal encephalomalacia is again noted. Mild atrophy and chronic small vessel white matter ischemic changes are again noted.  No acute intracranial abnormalities are identified, including mass lesion or mass effect, hydrocephalus, extra-axial fluid collection, midline shift, hemorrhage, or acute infarction.  The visualized bony calvarium is unremarkable except for prior right frontotemporal craniectomy.  IMPRESSION: No evidence of acute intracranial abnormality.  Bifrontal and right temporal encephalomalacia.  Atrophy and chronic small vessel white matter ischemic changes.  Original Report Authenticated By: Rosendo Gros, M.D.   Dg Chest Portable 1 View  08/08/2011  *RADIOLOGY REPORT*  Clinical Data: Weak.  Altered mental status.  PORTABLE CHEST - 1 VIEW  Comparison: Two-view chest 10/20/2010.  Findings: The lung volumes are low.  This exaggerates the heart size.  Minimal bibasilar atelectasis is present.  The lungs are otherwise clear.  The visualized soft tissues and bony thorax are unremarkable.  IMPRESSION:  1.  Low lung volumes. 2.  Mild bibasilar airspace disease.  This likely reflects atelectasis.  Early infection is not excluded.  Original Report Authenticated By: Jamesetta Orleans. MATTERN, M.D.   Medications: I have reviewed the patient's current medications. Scheduled Meds:    . aspirin EC  81 mg Oral Daily  . budesonide-formoterol  2 puff Inhalation BID  . cefTRIAXone (ROCEPHIN)  IV  1 g Intravenous QHS  . clopidogrel  75 mg Oral Q breakfast  . docusate sodium  100 mg Oral  Daily  . doxycycline (VIBRAMYCIN) IV  100 mg Intravenous BID  . DULoxetine  60 mg Oral Daily  . enoxaparin  40 mg Subcutaneous QHS  . fluticasone  2 spray Each Nare Daily  . metoprolol tartrate  25 mg Oral BID  . pantoprazole  40 mg Oral Daily  . white petrolatum       Continuous Infusions:    .  sodium chloride 75 mL/hr at 08/09/11 2020   PRN Meds:.albuterol, morphine, ondansetron (ZOFRAN) IV, ondansetron  Assessment/Plan: The patient is a 66 yo man, history of TBI and urinary incontinence, presenting with AMS.   # AMS - 2-3 day history of AMS and oliguria prior to admission, with UA concerning for UTI and urine culture growing proteus.  AMS has now resolved, awaiting urine culture sensitivities. -continue ceftriaxone -awaiting urine culture results with sensitivities, narrow abx at that time -IVF   # Back abscess - present x3 months, treated with hot pads and doxycycline x2 weeks (starting 3/22)  -s/p I&D on 4/13, produced about 1 tablespoon of blood and pus  -wound culture grew few proteus -morphine for pain   # HTN - on home metoprolol  -continue metoprolol   # Prior CVA  -continue Plavix   # PUD  -continue protonix   # Prophy - lovenox   LOS: 2 days   Janalyn Harder 08/10/2011, 8:37 AM

## 2011-08-10 NOTE — Progress Notes (Signed)
Brief Nutrition Note  Reason: Screened at Nutrition Risk for Dysphagia  Patient reported good appetite and intake. Patient is on a regular diet with PO intake documented 100% at meals. Patient reported no problems chewing or swallowing on his current diet order. Spoke with RN, she reported patient is eating well. Patient is not at nutrition risk at this time.  Height: 6' Weight: 199 lb.  BMI: 26.98 kg/m^2 (Overweight)  RD to follow for nutrition needs.  Todd [name] [facility] [phone]

## 2011-08-11 LAB — BASIC METABOLIC PANEL
BUN: 10 mg/dL (ref 6–23)
CO2: 27 mEq/L (ref 19–32)
Calcium: 8.8 mg/dL (ref 8.4–10.5)
Chloride: 113 mEq/L — ABNORMAL HIGH (ref 96–112)
Creatinine, Ser: 0.79 mg/dL (ref 0.50–1.35)
GFR calc Af Amer: >90 mL/min (ref >90)
Sodium: 148 mEq/L — ABNORMAL HIGH (ref 135–145)

## 2011-08-11 NOTE — Progress Notes (Signed)
Subjective: No acute events overnight.  [name] mental status at baseline.  Urine culture shows proteus species sensitive to ampicillin, will d/c home on amoxicillin.  Objective: Vital signs in last 24 hours: Filed Vitals:   08/10/11 1414 08/10/11 2244 08/11/11 0614 08/11/11 0628  BP: 129/82 118/69  105/68  Pulse: 85 75  84  Temp: 98 F (36.7 C) 97.5 F (36.4 C)  99.1 F (37.3 C)  TempSrc: Oral Oral  Oral  Resp: 22 18  20   Height:      Weight:   194 lb 10.7 oz (88.3 kg)   SpO2: 97% 92%  96%   Weight change: -5 lb 1.1 oz (-2.3 kg)  Intake/Output Summary (Last 24 hours) at 08/11/11 1117 Last data filed at 08/11/11 0630  Gross per 24 hour  Intake    249 ml  Output   3200 ml  Net  -2951 ml   Physical Exam: General: alert, cooperative, NAD HEENT: pupils equal round and reactive to light, vision grossly intact, oropharynx clear and non-erythematous, chronic right face partial paralysis Neck: supple, no lymphadenopathy Lungs: clear to ascultation bilaterally, normal work of respiration, no wheezes, rales, ronchi Heart: regular rate and rhythm, no murmurs, gallops, or rubs Abdomen: soft, non-tender, non-distended, normal bowel sounds  Back: 5x5 cm area of violacious edema, with central fluctuance, and central punctum (reported site of spontaneous drainage)  Extremities: no cyanosis, clubbing, or edema Neurologic: A&O x3, responds appropriately to questions, follows commands, moves all 4 extremities spontaneously   Lab Results: Basic Metabolic Panel:  Lab 08/10/11 2440 08/09/11 0600  NA 138 142  K 3.8 3.6  CL 107 111  CO2 25 22  GLUCOSE 100* 112*  BUN 11 15  CREATININE 0.85 0.74  CALCIUM 8.7 8.7  MG -- --  PHOS -- --   CBC:  Lab 08/10/11 0638 08/09/11 0600  WBC 12.4* 17.4*  NEUTROABS -- --  HGB 13.0 12.5*  HCT 39.6 37.5*  MCV 99.2 98.4  PLT 189 196   Cardiac Enzymes:  Lab 08/08/11 1248  CKTOTAL 52  CKMB 1.2  CKMBINDEX --  TROPONINI <0.30   CBG:  Lab  08/10/11 2242 08/10/11 1657 08/10/11 1130 08/10/11 0738 08/09/11 2211 08/09/11 1619  GLUCAP 142* 103* 188* 107* 125* 105*   Coagulation:  Lab 08/08/11 1248  LABPROT 15.2  INR 1.18   Urine Drug Screen: Drugs of Abuse     Component Value Date/Time   LABOPIA POSITIVE* 10/18/2009 1500   COCAINSCRNUR NONE DETECTED 10/18/2009 1500   LABBENZ NONE DETECTED 10/18/2009 1500   AMPHETMU NONE DETECTED 10/18/2009 1500   THCU NONE DETECTED 10/18/2009 1500   LABBARB  Value: NONE DETECTED        DRUG SCREEN FOR MEDICAL PURPOSES ONLY.  IF CONFIRMATION IS NEEDED FOR ANY PURPOSE, NOTIFY LAB WITHIN 5 DAYS.        LOWEST DETECTABLE LIMITS FOR URINE DRUG SCREEN Drug Class       Cutoff (ng/mL) Amphetamine      1000 Barbiturate      200 Benzodiazepine   200 Tricyclics       300 Opiates          300 Cocaine          300 THC              50 10/18/2009 1500    Urinalysis:  Lab 08/08/11 1504  COLORURINE AMBER*  LABSPEC 1.023  PHURINE 8.5*  GLUCOSEU NEGATIVE  HGBUR NEGATIVE  BILIRUBINUR NEGATIVE  KETONESUR NEGATIVE  PROTEINUR 30*  UROBILINOGEN 1.0  NITRITE NEGATIVE  LEUKOCYTESUR MODERATE*    Micro Results: Recent Results (from the past 240 hour(s))  URINE CULTURE     Status: Normal   Collection Time   08/08/11  4:22 PM      Component Value Range Status Comment   Specimen Description URINE, CLEAN CATCH   Final    Special Requests ADDED ON 08/08/11 AT 1700   Final    Culture  Setup Time [id number]   Final    Colony Count >=100,000 COLONIES/ML   Final    Culture PROTEUS MIRABILIS   Final    Report Status 08/10/2011 FINAL   Final    Organism ID, Bacteria PROTEUS MIRABILIS   Final   WOUND CULTURE     Status: Normal   Collection Time   08/08/11  4:58 PM      Component Value Range Status Comment   Specimen Description ABSCESS BACK   Final    Special Requests NONE   Final    Gram Stain     Final    Value: MODERATE WBC PRESENT, PREDOMINANTLY PMN     NO SQUAMOUS EPITHELIAL CELLS SEEN     NO ORGANISMS  SEEN   Culture FEW PROTEUS MIRABILIS   Final    Report Status 08/11/2011 FINAL   Final    Organism ID, Bacteria PROTEUS MIRABILIS   Final    Studies/Results: No results found. Medications: I have reviewed the patient's current medications. Scheduled Meds:    . aspirin EC  81 mg Oral Daily  . budesonide-formoterol  2 puff Inhalation BID  . cefTRIAXone (ROCEPHIN)  IV  1 g Intravenous QHS  . clopidogrel  75 mg Oral Q breakfast  . docusate sodium  100 mg Oral Daily  . doxycycline (VIBRAMYCIN) IV  100 mg Intravenous BID  . DULoxetine  60 mg Oral Daily  . enoxaparin  40 mg Subcutaneous QHS  . fluticasone  2 spray Each Nare Daily  . metoprolol tartrate  25 mg Oral BID  . pantoprazole  40 mg Oral Daily   Continuous Infusions:    . sodium chloride 75 mL/hr at 08/10/11 1558   PRN Meds:.albuterol, morphine, ondansetron (ZOFRAN) IV, ondansetron  Assessment/Plan: The patient is a 66 yo man, history of TBI and urinary incontinence, presenting with AMS.   # AMS - 2-3 day history of AMS and oliguria prior to admission, with UA concerning for UTI and urine culture growing proteus, sensitive to ampicillin.  AMS has now resolved. -change ceftriaxone to amoxicillin  # Back abscess - present x3 months, treated with hot pads and doxycycline x2 weeks (starting 3/22)  -s/p I&D on 4/13, produced about 1 tablespoon of blood and pus  -wound culture grew few proteus, concern for bacteremia -obtaining blood culture, but may be negative in setting of prior ceftriaxone usage -morphine for pain   # HTN - on home metoprolol  -continue metoprolol   # Prior CVA  -continue Plavix   # PUD  -continue protonix   # Prophy - lovenox  # Dispo - discharge home today   LOS: 3 days   Janalyn Harder 08/11/2011, 11:17 AM

## 2011-08-12 MED ORDER — AMOXICILLIN 500 MG PO TABS: 500.0000 mg | ORAL_TABLET | Freq: Two times a day (BID) | ORAL | Status: AC

## 2011-08-12 MED ORDER — AMOXICILLIN 500 MG PO TABS: 500.0000 mg | ORAL_TABLET | Freq: Two times a day (BID) | ORAL | Status: DC

## 2011-08-12 MED ORDER — AMOXICILLIN 500 MG PO CAPS
500.0000 mg | ORAL_CAPSULE | Freq: Two times a day (BID) | ORAL | Status: DC
  Administered 2011-08-12: 500 mg via ORAL
  Filled 2011-08-12 (×2): qty 1

## 2011-08-12 MED ORDER — SODIUM CHLORIDE 0.45 % IV BOLUS
500.0000 mL | Freq: Once | INTRAVENOUS | Status: AC
  Administered 2011-08-12: 500 mL via INTRAVENOUS

## 2011-08-12 NOTE — Discharge Summary (Signed)
Internal Medicine Teaching [facility] Discharge Note  Name: Todd [name] MRN: [id number] DOB: 21-Jun-1945 66 y.o.  Date of Admission: 08/08/2011 12:15 PM Date of Discharge: 08/12/2011 Attending Physician: Burns Spain, MD  Discharge Diagnosis: 1. Urinary Tract Infection - leading to altered mental status 2. Back Abscess 3. Hypertension 4. History of Traumatic Brain Injury 5. Prior CVA 6. GERD  Discharge Medications: Medication List  As of 08/12/2011 12:11 PM   STOP taking these medications         chlorhexidine 0.12 % solution      Pampers Easy Pull-Ups Misc      SYMBICORT 80-4.5 MCG/ACT inhaler         TAKE these medications         amoxicillin 500 MG tablet   Commonly known as: AMOXIL   Take 1 tablet (500 mg total) by mouth 2 (two) times daily.      aspirin 81 MG EC tablet   Take 81 mg by mouth daily.      budesonide-formoterol 80-4.5 MCG/ACT inhaler   Commonly known as: SYMBICORT   Inhale 2 puffs into the lungs 2 (two) times daily.      clopidogrel 75 MG tablet   Commonly known as: PLAVIX   take 1 tablet by mouth once daily      desonide 0.05 % cream   Commonly known as: DESOWEN   Apply topically 2 (two) times daily. Apply to the affected area every night at bedtime.      dextromethorphan-guaiFENesin 30-600 MG per 12 hr tablet   Commonly known as: MUCINEX DM   Take 1 tablet by mouth every 12 (twelve) hours.      Diapers & Supplies Misc   Please provide 120 diaper for use for patient's incontinence      diclofenac 75 MG EC tablet   Commonly known as: VOLTAREN   take 1 tablet by mouth twice a day      docusate sodium 100 MG capsule   Commonly known as: COLACE   Take 100 mg by mouth daily.      DULoxetine 60 MG capsule   Commonly known as: CYMBALTA   Take 1 capsule (60 mg total) by mouth daily.      esomeprazole 20 MG capsule   Commonly known as: NEXIUM   Take 1 capsule (20 mg total) by mouth daily before breakfast.     fluticasone 50 MCG/ACT nasal spray   Commonly known as: FLONASE   Place 2 sprays into the nose daily.      metoprolol tartrate 25 MG tablet   Commonly known as: LOPRESSOR   Take 25 mg by mouth 2 (two) times daily.      morphine 15 MG 12 hr tablet   Commonly known as: MS CONTIN   Take 1 tablet (15 mg total) by mouth at bedtime as needed for pain.      VENTOLIN HFA 108 (90 BASE) MCG/ACT inhaler   Generic drug: albuterol   Inhale 2 puffs into the lungs every 4 (four) hours as needed. For shortness of breath            Disposition and follow-up:   [name] was discharged from [facility] in stable and improved condition, with resolution of altered mental status.  The patient will follow-up on 4/25 with Dr. Dierdre Searles, to check the patient's back abscess to ensure that it is resolving/draining, and responding to the amoxicillin.  Follow-up Appointments: Discharge Orders  Future Appointments: Provider: Department: Dept Phone: Center:   08/20/2011 2:15 PM Dede Query, MD [facility] [phone] [facility]      Consultations: None  Procedures Performed:  Ct Head Wo Contrast  08/08/2011  *RADIOLOGY REPORT*  Clinical Data: Ataxia and altered mental status.  CT HEAD WITHOUT CONTRAST  Technique:  Contiguous axial images were obtained from the base of the skull through the vertex without contrast.  Comparison: 10/18/2009 MRI and CT  Findings: Bifrontal and right temporal encephalomalacia is again noted. Mild atrophy and chronic small vessel white matter ischemic changes are again noted.  No acute intracranial abnormalities are identified, including mass lesion or mass effect, hydrocephalus, extra-axial fluid collection, midline shift, hemorrhage, or acute infarction.  The visualized bony calvarium is unremarkable except for prior right frontotemporal craniectomy.  IMPRESSION: No evidence of acute intracranial abnormality.  Bifrontal and right temporal encephalomalacia.  Atrophy  and chronic small vessel white matter ischemic changes.  Original Report Authenticated By: Rosendo Gros, M.D.   Dg Chest Portable 1 View  08/08/2011  *RADIOLOGY REPORT*  Clinical Data: Weak.  Altered mental status.  PORTABLE CHEST - 1 VIEW  Comparison: Two-view chest 10/20/2010.  Findings: The lung volumes are low.  This exaggerates the heart size.  Minimal bibasilar atelectasis is present.  The lungs are otherwise clear.  The visualized soft tissues and bony thorax are unremarkable.  IMPRESSION:  1.  Low lung volumes. 2.  Mild bibasilar airspace disease.  This likely reflects atelectasis.  Early infection is not excluded.  Original Report Authenticated By: Jamesetta Orleans. MATTERN, M.D.    Admission HPI:  The patient is a 66 yo man, history of TBI 20 yrs ago, urinary incontinence, depression, presenting with altered mental status. The patient is unable to answer most questions, and the majority of the history was obtained from chart review and the patient's wife. At baseline, the patient is able to perform ADL's with assistance, cooperates with walking, and can follow commands. For the last 2-3 days, he has been unable to participate in these ADL's, and has appeared more confused. The patient also notes some mild dysuria, and the patient's aide notes decreased urine output.  Admission Physical Exam Blood pressure 96/61, pulse 91, temperature 99.4 F (37.4 C), temperature source Oral, resp. rate 18, SpO2 96.00%.  General: cooperative, follows commands, appears confused HEENT: pupils equal round and reactive to light, vision grossly intact, oropharynx clear and non-erythematous, chronic right face partial paralysis Neck: supple, no lymphadenopathy Lungs: clear to ascultation bilaterally, normal work of respiration, no wheezes, rales, ronchi Heart: regular rate and rhythm, no murmurs, gallops, or rubs Abdomen: soft, non-tender, non-distended, normal bowel sounds  Back: 5x5 cm area of violacious edema,  with central fluctuance, and central punctum (reported site of spontaneous drainage)  Extremities: no cyanosis, clubbing, or edema Neurologic: patient can give minimal verbal responses, can follow commands, moves all 4 extremities spontaneously.  Admission Labs Basic Metabolic Panel:   Basename  08/08/11 1248   NA  141   K  4.0   CL  106   CO2  25   GLUCOSE  123*   BUN  18   CREATININE  0.90   CALCIUM  9.0   MG  --   PHOS  --    CBC:  Basename  08/08/11 1248   WBC  19.2*   NEUTROABS  --   HGB  13.7   HCT  41.0   MCV  99.3   PLT  217  Hospital Course by problem list: 1. Urinary Tract Infection - The patient presented with altered mental status, with urinary incontinence at baseline, with a UA showing moderate leukocytes and 11-20 WBC's, and urine culture growing Proteus.  The patient was initially treated with Ceftriaxone for 4 days, then switched to amoxicillin based on the urine culture sensitivities, to complete an additional 7 days.  The patient's AMS resolved after the first day of hospitalization, and he noted no further dysuria by the day of discharge.  2. Back Abscess - the patient has a mid-upper back abscess, left of his spinous processes, which has been present for about 3 months.  The patient was seen in our clinic for this issue on 3/22, at which time no appreciable fluctuance was noted, and the patient was treated with doxycyline x14 days.  On presentation to the ED for this hospitalization, the area had grown, and was now somewhat fluctuant.  The ED provided performed an I&D which yielded scant bloody fluid.  Our team repeated the I&D in a more fluctuant area of the abscess, which yielded about 1 tablespoon of mostly blood, mixed with some yellow exudate.  The exudate was cultured, which yielded proteus species, also sensitive to amoxicillin.  The area remained freely draining throughout hospitalization.  The patient will continue 7 days of amoxicillin at discharge,  with daily wound dressing changes at home.  3. Hypertension - the patient's BP's were well-controlled throughout hospitalization, maintained on home metoprolol  Time spent on discharge: 45 minutes  Discharge Vitals:  BP 125/74  Pulse 74  Temp(Src) 97.8 F (36.6 C) (Oral)  Resp 20  Ht 6' (1.829 m)  Wt 194 lb 10.7 oz (88.3 kg)  BMI 26.40 kg/m2  SpO2 97%  Discharge Labs:  Results for orders placed during the hospital encounter of 08/08/11 (from the past 24 hour(s))  BASIC METABOLIC PANEL     Status: Abnormal   Collection Time   08/11/11  4:22 PM      Component Value Range   Sodium 148 (*) 135 - 145 (mEq/L)   Potassium 3.7  3.5 - 5.1 (mEq/L)   Chloride 113 (*) 96 - 112 (mEq/L)   CO2 27  19 - 32 (mEq/L)   Glucose, Bld 115 (*) 70 - 99 (mg/dL)   BUN 10  6 - 23 (mg/dL)   Creatinine, Ser 4.09  0.50 - 1.35 (mg/dL)   Calcium 8.8  8.4 - 81.1 (mg/dL)   GFR calc non Af Amer >90  >90 (mL/min)   GFR calc Af Amer >90  >90 (mL/min)    Signed: Janalyn Harder 08/12/2011, 12:11 PM

## 2011-08-12 NOTE — Discharge Instructions (Signed)

## 2011-08-12 NOTE — Progress Notes (Signed)
Subjective: Patient slept well overnight.  Could not discharge yesterday because patient's wife could not get the appropriate home help needed.  Plan for discharge today.  Objective: Vital signs in last 24 hours: Filed Vitals:   08/11/11 0628 08/11/11 1359 08/11/11 2048 08/12/11 0520  BP: 105/68 102/52 110/68 125/74  Pulse: 84 67 71 74  Temp: 99.1 F (37.3 C) 98.7 F (37.1 C) 97.6 F (36.4 C) 97.8 F (36.6 C)  TempSrc: Oral Oral Oral Oral  Resp: 20 20 20 20   Height:      Weight:      SpO2: 96% 97% 96% 94%   Weight change:   Intake/Output Summary (Last 24 hours) at 08/12/11 0739 Last data filed at 08/11/11 1700  Gross per 24 hour  Intake    360 ml  Output   1100 ml  Net   -740 ml   Physical Exam: General: alert, cooperative, NAD HEENT: pupils equal round and reactive to light, vision grossly intact, oropharynx clear and non-erythematous, chronic right face partial paralysis Neck: supple, no lymphadenopathy Lungs: clear to ascultation bilaterally, normal work of respiration, no wheezes, rales, ronchi Heart: regular rate and rhythm, no murmurs, gallops, or rubs Abdomen: soft, non-tender, non-distended, normal bowel sounds  Back: 5x5 cm area of violacious edema, with central fluctuance, and central punctum (reported site of spontaneous drainage)  Extremities: no cyanosis, clubbing, or edema Neurologic: A&O x3, responds appropriately to questions, follows commands, moves all 4 extremities spontaneously   Lab Results: Basic Metabolic Panel:  Lab 08/11/11 1610 08/10/11 0638  NA 148* 138  K 3.7 3.8  CL 113* 107  CO2 27 25  GLUCOSE 115* 100*  BUN 10 11  CREATININE 0.79 0.85  CALCIUM 8.8 8.7  MG -- --  PHOS -- --   CBC:  Lab 08/10/11 0638 08/09/11 0600  WBC 12.4* 17.4*  NEUTROABS -- --  HGB 13.0 12.5*  HCT 39.6 37.5*  MCV 99.2 98.4  PLT 189 196   Cardiac Enzymes:  Lab 08/08/11 1248  CKTOTAL 52  CKMB 1.2  CKMBINDEX --  TROPONINI <0.30   CBG:  Lab  08/10/11 2242 08/10/11 1657 08/10/11 1130 08/10/11 0738 08/09/11 2211 08/09/11 1619  GLUCAP 142* 103* 188* 107* 125* 105*   Coagulation:  Lab 08/08/11 1248  LABPROT 15.2  INR 1.18   Urine Drug Screen: Drugs of Abuse     Component Value Date/Time   LABOPIA POSITIVE* 10/18/2009 1500   COCAINSCRNUR NONE DETECTED 10/18/2009 1500   LABBENZ NONE DETECTED 10/18/2009 1500   AMPHETMU NONE DETECTED 10/18/2009 1500   THCU NONE DETECTED 10/18/2009 1500   LABBARB  Value: NONE DETECTED        DRUG SCREEN FOR MEDICAL PURPOSES ONLY.  IF CONFIRMATION IS NEEDED FOR ANY PURPOSE, NOTIFY LAB WITHIN 5 DAYS.        LOWEST DETECTABLE LIMITS FOR URINE DRUG SCREEN Drug Class       Cutoff (ng/mL) Amphetamine      1000 Barbiturate      200 Benzodiazepine   200 Tricyclics       300 Opiates          300 Cocaine          300 THC              50 10/18/2009 1500    Urinalysis:  Lab 08/08/11 1504  COLORURINE AMBER*  LABSPEC 1.023  PHURINE 8.5*  GLUCOSEU NEGATIVE  HGBUR NEGATIVE  BILIRUBINUR NEGATIVE  KETONESUR NEGATIVE  PROTEINUR 30*  UROBILINOGEN 1.0  NITRITE NEGATIVE  LEUKOCYTESUR MODERATE*    Micro Results: Recent Results (from the past 240 hour(s))  URINE CULTURE     Status: Normal   Collection Time   08/08/11  4:22 PM      Component Value Range Status Comment   Specimen Description URINE, CLEAN CATCH   Final    Special Requests ADDED ON 08/08/11 AT 1700   Final    Culture  Setup Time [id number]   Final    Colony Count >=100,000 COLONIES/ML   Final    Culture PROTEUS MIRABILIS   Final    Report Status 08/10/2011 FINAL   Final    Organism ID, Bacteria PROTEUS MIRABILIS   Final   WOUND CULTURE     Status: Normal   Collection Time   08/08/11  4:58 PM      Component Value Range Status Comment   Specimen Description ABSCESS BACK   Final    Special Requests NONE   Final    Gram Stain     Final    Value: MODERATE WBC PRESENT, PREDOMINANTLY PMN     NO SQUAMOUS EPITHELIAL CELLS SEEN     NO ORGANISMS  SEEN   Culture FEW PROTEUS MIRABILIS   Final    Report Status 08/11/2011 FINAL   Final    Organism ID, Bacteria PROTEUS MIRABILIS   Final    Studies/Results: No results found. Medications: I have reviewed the patient's current medications. Scheduled Meds:    . aspirin EC  81 mg Oral Daily  . budesonide-formoterol  2 puff Inhalation BID  . cefTRIAXone (ROCEPHIN)  IV  1 g Intravenous QHS  . clopidogrel  75 mg Oral Q breakfast  . docusate sodium  100 mg Oral Daily  . doxycycline (VIBRAMYCIN) IV  100 mg Intravenous BID  . DULoxetine  60 mg Oral Daily  . enoxaparin  40 mg Subcutaneous QHS  . fluticasone  2 spray Each Nare Daily  . metoprolol tartrate  25 mg Oral BID  . pantoprazole  40 mg Oral Daily   Continuous Infusions:    . DISCONTD: sodium chloride 75 mL/hr at 08/10/11 1558   PRN Meds:.albuterol, morphine, ondansetron (ZOFRAN) IV, ondansetron  Assessment/Plan: The patient is a 66 yo man, history of TBI and urinary incontinence, presenting with AMS.   # AMS - 2-3 day history of AMS and oliguria prior to admission, with UA concerning for UTI and urine culture growing proteus, sensitive to ampicillin.  AMS has now resolved. -continue amoxicillin  # Back abscess - present x3 months, treated with hot pads and doxycycline x2 weeks (starting 3/22)  -s/p I&D on 4/13, produced about 1 tablespoon of blood and pus  -wound culture grew few proteus, concern for bacteremia -blood culture pending -morphine for pain   # HTN - on home metoprolol  -continue metoprolol   # Prior CVA  -continue Plavix   # PUD  -continue protonix   # Prophy - lovenox  # Dispo - discharge home today   LOS: 4 days   Janalyn Harder 08/12/2011, 7:39 AM

## 2011-08-12 NOTE — Progress Notes (Signed)
Patient discharge instructions provided to patient's wife. All questions answered to satisfy wife. Patient with no signs of acute distress. D/C home.

## 2011-08-17 LAB — CULTURE, BLOOD (ROUTINE X 2)
Culture  Setup Time: 201304160148
Culture: NO GROWTH
Culture: NO GROWTH

## 2011-08-20 ENCOUNTER — Other Ambulatory Visit: Payer: Self-pay | Admitting: Internal Medicine

## 2011-08-20 ENCOUNTER — Ambulatory Visit (INDEPENDENT_AMBULATORY_CARE_PROVIDER_SITE_OTHER): Payer: Medicaid Other | Admitting: Internal Medicine

## 2011-08-20 ENCOUNTER — Encounter: Payer: Self-pay | Admitting: Internal Medicine

## 2011-08-20 VITALS — BP 103/66 | HR 100 | Temp 97.3°F

## 2011-08-20 DIAGNOSIS — L02222 Furuncle of back [any part, except buttock]: Secondary | ICD-10-CM

## 2011-08-20 DIAGNOSIS — L02229 Furuncle of trunk, unspecified: Secondary | ICD-10-CM

## 2011-08-20 DIAGNOSIS — N390 Urinary tract infection, site not specified: Secondary | ICD-10-CM

## 2011-08-20 DIAGNOSIS — M8080XA Other osteoporosis with current pathological fracture, unspecified site, initial encounter for fracture: Secondary | ICD-10-CM

## 2011-08-20 DIAGNOSIS — M8440XA Pathological fracture, unspecified site, initial encounter for fracture: Secondary | ICD-10-CM

## 2011-08-20 MED ORDER — MORPHINE SULFATE CR 15 MG PO TB12: 15.0000 mg | ORAL_TABLET | Freq: Two times a day (BID) | ORAL | Status: AC

## 2011-08-20 MED ORDER — AMOXICILLIN 500 MG PO CAPS: 500.0000 mg | ORAL_CAPSULE | Freq: Two times a day (BID) | ORAL | Status: AC

## 2011-08-20 MED ORDER — HYDROCODONE-IBUPROFEN 7.5-200 MG PO TABS: 1.0000 | ORAL_TABLET | ORAL | Status: AC | PRN

## 2011-08-20 NOTE — Patient Instructions (Signed)
1. Follow up with your PCP in one month 2. Will send referral to Dr. Terri Piedra

## 2011-08-21 ENCOUNTER — Encounter: Payer: Self-pay | Admitting: Internal Medicine

## 2011-08-21 NOTE — Assessment & Plan Note (Signed)
Discussed with wife about pain management.  She states that he had tried Vicoprofen in the past and it worked for him during the daytime. She also states that he responds well to night time MS contin so she would like him to continue his MS contin at nighttime.   - will instruct patient to take Vicoprofen during the daytime and MS contin at bedtime for pain control. - case discussed with Dr. Aundria Rud.
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
## 2011-08-21 NOTE — Assessment & Plan Note (Signed)
See HPI  - patient wife would like to see Dermatologist Dr. Terri Piedra who has seen him in the past.  - will give him additional 5 days of Amoxicillin and send referral to Dr. Terri Piedra. - Dr. Aundria Rud discussed with patient and his wife. Examined patient as well.

## 2011-08-21 NOTE — Assessment & Plan Note (Signed)
Resolved

## 2011-09-16 ENCOUNTER — Encounter (HOSPITAL_COMMUNITY): Payer: Self-pay | Admitting: *Deleted

## 2011-09-16 ENCOUNTER — Emergency Department (HOSPITAL_COMMUNITY)
Admission: EM | Admit: 2011-09-16 | Discharge: 2011-09-16 | Disposition: A | Discharge status: Home / Self Care | Payer: Medicaid Other | Attending: Emergency Medicine | Admitting: Emergency Medicine

## 2011-09-16 DIAGNOSIS — R8290 Unspecified abnormal findings in urine: Secondary | ICD-10-CM

## 2011-09-16 DIAGNOSIS — Z79899 Other long term (current) drug therapy: Secondary | ICD-10-CM | POA: Insufficient documentation

## 2011-09-16 DIAGNOSIS — R82998 Other abnormal findings in urine: Secondary | ICD-10-CM | POA: Insufficient documentation

## 2011-09-16 DIAGNOSIS — R109 Unspecified abdominal pain: Secondary | ICD-10-CM | POA: Insufficient documentation

## 2011-09-16 DIAGNOSIS — K219 Gastro-esophageal reflux disease without esophagitis: Secondary | ICD-10-CM | POA: Insufficient documentation

## 2011-09-16 DIAGNOSIS — Z8673 Personal history of transient ischemic attack (TIA), and cerebral infarction without residual deficits: Secondary | ICD-10-CM | POA: Insufficient documentation

## 2011-09-16 LAB — DIFFERENTIAL
Basophils Absolute: 0.1 10*3/uL (ref 0.0–0.1)
Basophils Relative: 1 % (ref 0–1)
Eosinophils Absolute: 0.1 10*3/uL (ref 0.0–0.7)
Eosinophils Relative: 1 % (ref 0–5)
Monocytes Absolute: 0.8 10*3/uL (ref 0.1–1.0)

## 2011-09-16 LAB — CBC
HCT: 44.2 % (ref 39.0–52.0)
MCH: 33 pg (ref 26.0–34.0)
MCHC: 33.3 g/dL (ref 30.0–36.0)
MCV: 99.3 fL (ref 78.0–100.0)
RDW: 13.7 % (ref 11.5–15.5)

## 2011-09-16 LAB — BASIC METABOLIC PANEL
CO2: 29 mEq/L (ref 19–32)
Calcium: 9.6 mg/dL (ref 8.4–10.5)
Creatinine, Ser: 0.86 mg/dL (ref 0.50–1.35)

## 2011-09-16 LAB — URINALYSIS, ROUTINE W REFLEX MICROSCOPIC
Glucose, UA: NEGATIVE mg/dL
Hgb urine dipstick: NEGATIVE
Ketones, ur: 15 mg/dL — AB
Protein, ur: NEGATIVE mg/dL

## 2011-09-16 NOTE — ED Notes (Signed)
Pt given an urinal to use to attempt to provide an urine specimen when he can

## 2011-09-16 NOTE — ED Notes (Signed)
Pt with hx of TBI. CNA called EMS for foul smelling urine.

## 2011-09-16 NOTE — ED Notes (Signed)
Discharge instructions given to spouse over telephone by EDP. Pt discharged home via [facility] in stable condition. Home number called prior to discharge and talked to caregiver to make sure someone would be home with pt.

## 2011-09-16 NOTE — ED Notes (Signed)
Pt unable to sign  
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
## 2011-09-16 NOTE — Discharge Instructions (Signed)
Drink more water 

## 2011-09-16 NOTE — ED Notes (Signed)
Pt given a cup of ice water to drink to see if he can provide an urine specimen per Ranae Palms, MD; Amy, RN aware

## 2011-09-23 ENCOUNTER — Telehealth: Payer: Self-pay | Admitting: Internal Medicine

## 2011-09-23 NOTE — Telephone Encounter (Signed)
Call from wife requesting permission for Todd [name] to receive typhoid and measles vaccines as they are moving to [place] for several years.  Health Department has standard procedures for these vaccines but needed to know that this patient had no contra-indications.  He does not.  His chronic problems are not immunologic in nature.  I authorized Juline Patch at the [facility] to use their standard protocols for him.

## 2011-09-29 ENCOUNTER — Ambulatory Visit: Payer: Medicaid Other | Admitting: Internal Medicine

## 2011-09-29 ENCOUNTER — Encounter: Payer: Self-pay | Admitting: Internal Medicine

## 2011-09-29 VITALS — BP 114/76 | HR 66 | Wt 197.9 lb

## 2011-09-29 DIAGNOSIS — I498 Other specified cardiac arrhythmias: Secondary | ICD-10-CM

## 2011-09-29 DIAGNOSIS — B1710 Acute hepatitis C without hepatic coma: Secondary | ICD-10-CM

## 2011-09-29 DIAGNOSIS — R4701 Aphasia: Secondary | ICD-10-CM

## 2011-09-29 DIAGNOSIS — M8080XA Other osteoporosis with current pathological fracture, unspecified site, initial encounter for fracture: Secondary | ICD-10-CM

## 2011-09-29 DIAGNOSIS — L02222 Furuncle of back [any part, except buttock]: Secondary | ICD-10-CM

## 2011-09-29 MED ORDER — OXYCODONE HCL 5 MG PO TABS: 5.0000 mg | ORAL_TABLET | ORAL | Status: DC | PRN

## 2011-09-29 NOTE — Assessment & Plan Note (Signed)
[name] wife is a little apprehensive about metoprolol and was asking about the side effects. The questions were appropriately answered.

## 2011-09-29 NOTE — Assessment & Plan Note (Signed)
Resolved
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
## 2011-09-29 NOTE — Assessment & Plan Note (Signed)
Stable at this time. No new labs required at this time.
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
## 2011-09-29 NOTE — Patient Instructions (Signed)
Please followup as needed. Please call back our office for any refills on any of the medications. Please take all medications as noted.

## 2011-10-05 ENCOUNTER — Other Ambulatory Visit: Payer: Self-pay | Admitting: *Deleted

## 2011-10-05 MED ORDER — FLUTICASONE PROPIONATE 50 MCG/ACT NA SUSP: 2.0000 | Freq: Every day | NASAL | Status: DC

## 2011-10-18 ENCOUNTER — Other Ambulatory Visit: Payer: Self-pay | Admitting: Internal Medicine

## 2011-11-26 ENCOUNTER — Telehealth: Payer: Self-pay | Admitting: Licensed Clinical Social Worker

## 2011-11-26 NOTE — Telephone Encounter (Signed)
[facility] received documentation that pt was d/c from [facility] due to moving outside of their service area.  CSW placed call to contact number on chart and left message.  CSW attempting to inquire if pt needs new referral for services and update information.

## 2011-11-26 NOTE — Telephone Encounter (Signed)
CSW received return message from pt's spouse.  Pt and spouse have moved to [place] area and no longer require Medicaid PCS.  Since move, Pt's family is able to private pay for aide care.

## 2012-04-08 ENCOUNTER — Encounter (HOSPITAL_COMMUNITY): Payer: Self-pay | Admitting: *Deleted

## 2012-04-08 ENCOUNTER — Emergency Department (HOSPITAL_COMMUNITY): Payer: Medicaid Other

## 2012-04-08 ENCOUNTER — Inpatient Hospital Stay (HOSPITAL_COMMUNITY)
Admission: EM | Admit: 2012-04-08 | Discharge: 2012-04-12 | DRG: 690 | Disposition: A | Discharge status: Skilled Nursing Facility | Payer: Medicaid Other | Attending: Internal Medicine | Admitting: Internal Medicine

## 2012-04-08 ENCOUNTER — Emergency Department (HOSPITAL_COMMUNITY)
Admission: EM | Admit: 2012-04-08 | Discharge: 2012-04-08 | Disposition: A | Discharge status: Home / Self Care | Payer: Medicaid Other | Attending: Emergency Medicine | Admitting: Emergency Medicine

## 2012-04-08 DIAGNOSIS — Z8711 Personal history of peptic ulcer disease: Secondary | ICD-10-CM

## 2012-04-08 DIAGNOSIS — Z7902 Long term (current) use of antithrombotics/antiplatelets: Secondary | ICD-10-CM

## 2012-04-08 DIAGNOSIS — R5381 Other malaise: Secondary | ICD-10-CM | POA: Insufficient documentation

## 2012-04-08 DIAGNOSIS — Z87820 Personal history of traumatic brain injury: Secondary | ICD-10-CM

## 2012-04-08 DIAGNOSIS — Z23 Encounter for immunization: Secondary | ICD-10-CM

## 2012-04-08 DIAGNOSIS — Z8673 Personal history of transient ischemic attack (TIA), and cerebral infarction without residual deficits: Secondary | ICD-10-CM | POA: Insufficient documentation

## 2012-04-08 DIAGNOSIS — M810 Age-related osteoporosis without current pathological fracture: Secondary | ICD-10-CM | POA: Diagnosis present

## 2012-04-08 DIAGNOSIS — I498 Other specified cardiac arrhythmias: Secondary | ICD-10-CM | POA: Diagnosis present

## 2012-04-08 DIAGNOSIS — R531 Weakness: Secondary | ICD-10-CM | POA: Diagnosis present

## 2012-04-08 DIAGNOSIS — K219 Gastro-esophageal reflux disease without esophagitis: Secondary | ICD-10-CM | POA: Insufficient documentation

## 2012-04-08 DIAGNOSIS — Z87448 Personal history of other diseases of urinary system: Secondary | ICD-10-CM | POA: Insufficient documentation

## 2012-04-08 DIAGNOSIS — R471 Dysarthria and anarthria: Secondary | ICD-10-CM | POA: Diagnosis present

## 2012-04-08 DIAGNOSIS — Z885 Allergy status to narcotic agent status: Secondary | ICD-10-CM

## 2012-04-08 DIAGNOSIS — Z79899 Other long term (current) drug therapy: Secondary | ICD-10-CM | POA: Insufficient documentation

## 2012-04-08 DIAGNOSIS — G8929 Other chronic pain: Secondary | ICD-10-CM | POA: Diagnosis present

## 2012-04-08 DIAGNOSIS — Z886 Allergy status to analgesic agent status: Secondary | ICD-10-CM

## 2012-04-08 DIAGNOSIS — G40909 Epilepsy, unspecified, not intractable, without status epilepticus: Secondary | ICD-10-CM | POA: Insufficient documentation

## 2012-04-08 DIAGNOSIS — Z85828 Personal history of other malignant neoplasm of skin: Secondary | ICD-10-CM | POA: Insufficient documentation

## 2012-04-08 DIAGNOSIS — H5440 Blindness, one eye, unspecified eye: Secondary | ICD-10-CM | POA: Diagnosis present

## 2012-04-08 DIAGNOSIS — Z87828 Personal history of other (healed) physical injury and trauma: Secondary | ICD-10-CM | POA: Insufficient documentation

## 2012-04-08 DIAGNOSIS — M549 Dorsalgia, unspecified: Secondary | ICD-10-CM | POA: Diagnosis present

## 2012-04-08 DIAGNOSIS — R569 Unspecified convulsions: Secondary | ICD-10-CM | POA: Diagnosis present

## 2012-04-08 DIAGNOSIS — Z8619 Personal history of other infectious and parasitic diseases: Secondary | ICD-10-CM | POA: Insufficient documentation

## 2012-04-08 DIAGNOSIS — Z7901 Long term (current) use of anticoagulants: Secondary | ICD-10-CM | POA: Insufficient documentation

## 2012-04-08 DIAGNOSIS — R5383 Other fatigue: Secondary | ICD-10-CM | POA: Insufficient documentation

## 2012-04-08 DIAGNOSIS — M8448XA Pathological fracture, other site, initial encounter for fracture: Secondary | ICD-10-CM | POA: Diagnosis present

## 2012-04-08 DIAGNOSIS — Z9181 History of falling: Secondary | ICD-10-CM

## 2012-04-08 DIAGNOSIS — B1920 Unspecified viral hepatitis C without hepatic coma: Secondary | ICD-10-CM | POA: Diagnosis present

## 2012-04-08 DIAGNOSIS — N390 Urinary tract infection, site not specified: Principal | ICD-10-CM | POA: Diagnosis present

## 2012-04-08 HISTORY — DX: Unspecified intracranial injury with loss of consciousness of unspecified duration, initial encounter: S06.9X9A

## 2012-04-08 LAB — CBC WITH DIFFERENTIAL/PLATELET
Basophils Absolute: 0.1 10*3/uL (ref 0.0–0.1)
Eosinophils Absolute: 0.2 10*3/uL (ref 0.0–0.7)
Eosinophils Relative: 2 % (ref 0–5)
Lymphocytes Relative: 19 % (ref 12–46)
MCV: 98.2 fL (ref 78.0–100.0)
Platelets: 233 10*3/uL (ref 150–400)
RDW: 13.3 % (ref 11.5–15.5)
WBC: 7.4 10*3/uL (ref 4.0–10.5)

## 2012-04-08 LAB — URINALYSIS, ROUTINE W REFLEX MICROSCOPIC
Glucose, UA: NEGATIVE mg/dL
Specific Gravity, Urine: 1.028 (ref 1.005–1.030)
pH: 6.5 (ref 5.0–8.0)

## 2012-04-08 LAB — COMPREHENSIVE METABOLIC PANEL
Albumin: 3.9 g/dL (ref 3.5–5.2)
BUN: 17 mg/dL (ref 6–23)
Calcium: 9.5 mg/dL (ref 8.4–10.5)
Creatinine, Ser: 0.89 mg/dL (ref 0.50–1.35)
Potassium: 3.7 mEq/L (ref 3.5–5.1)
Total Protein: 7.3 g/dL (ref 6.0–8.3)

## 2012-04-08 NOTE — ED Notes (Signed)
Report to significant other pt is returning home by [facility]
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
## 2012-04-08 NOTE — ED Notes (Signed)
[id number]> Expected date:<BR> Expected time:<BR> Means of arrival:<BR> Comments:<BR> Hold for d/c-readmit

## 2012-04-08 NOTE — ED Notes (Signed)
Pt in from home by ems. Generalized weakness for past few months,difficulty ambulating for past few weeks. Uses a walker normally. Hx TBI and seizures. Chronic back pain.
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
## 2012-04-08 NOTE — ED Notes (Signed)
Pt returned by [facility],  Stated that pt's girlfriend met [facility] in parking lot and stated she couldn't take care of him that he needed to be placed in rehab.

## 2012-04-08 NOTE — ED Notes (Signed)
Per Dr Dierdre Highman,  Pt's partner  Called multiple times,  No answer, will continue to try and call

## 2012-04-08 NOTE — ED Notes (Signed)
2 attempts to  Return call to [employer] [phone]

## 2012-04-09 ENCOUNTER — Inpatient Hospital Stay (HOSPITAL_COMMUNITY): Payer: Medicaid Other

## 2012-04-09 ENCOUNTER — Encounter (HOSPITAL_COMMUNITY): Payer: Self-pay | Admitting: *Deleted

## 2012-04-09 DIAGNOSIS — G8929 Other chronic pain: Secondary | ICD-10-CM | POA: Diagnosis present

## 2012-04-09 DIAGNOSIS — R531 Weakness: Secondary | ICD-10-CM | POA: Diagnosis present

## 2012-04-09 DIAGNOSIS — I498 Other specified cardiac arrhythmias: Secondary | ICD-10-CM

## 2012-04-09 DIAGNOSIS — R5381 Other malaise: Secondary | ICD-10-CM

## 2012-04-09 DIAGNOSIS — R5383 Other fatigue: Secondary | ICD-10-CM

## 2012-04-09 LAB — TSH: TSH: 1.431 u[IU]/mL (ref 0.350–4.500)

## 2012-04-09 MED ORDER — ONDANSETRON HCL 4 MG/2ML IJ SOLN
4.0000 mg | Freq: Four times a day (QID) | INTRAMUSCULAR | Status: DC | PRN
  Administered 2012-04-09: 4 mg via INTRAVENOUS
  Filled 2012-04-09: qty 2

## 2012-04-09 MED ORDER — FLUTICASONE PROPIONATE 50 MCG/ACT NA SUSP
2.0000 | Freq: Every day | NASAL | Status: DC
  Administered 2012-04-09 – 2012-04-12 (×4): 2 via NASAL
  Filled 2012-04-09: qty 16

## 2012-04-09 MED ORDER — CLOPIDOGREL BISULFATE 75 MG PO TABS
75.0000 mg | ORAL_TABLET | Freq: Every day | ORAL | Status: DC
  Administered 2012-04-09 – 2012-04-12 (×4): 75 mg via ORAL
  Filled 2012-04-09 (×5): qty 1

## 2012-04-09 MED ORDER — ONDANSETRON HCL 4 MG/2ML IJ SOLN: 4.0000 mg | Freq: Four times a day (QID) | INTRAMUSCULAR | Status: DC | PRN

## 2012-04-09 MED ORDER — METOPROLOL TARTRATE 12.5 MG HALF TABLET
12.5000 mg | ORAL_TABLET | Freq: Two times a day (BID) | ORAL | Status: DC
  Administered 2012-04-09 – 2012-04-12 (×7): 12.5 mg via ORAL
  Filled 2012-04-09 (×8): qty 1

## 2012-04-09 MED ORDER — MORPHINE SULFATE 2 MG/ML IJ SOLN
2.0000 mg | INTRAMUSCULAR | Status: DC | PRN
  Administered 2012-04-09: 2 mg via INTRAVENOUS
  Filled 2012-04-09: qty 1

## 2012-04-09 MED ORDER — SODIUM CHLORIDE 0.9 % IV SOLN
INTRAVENOUS | Status: AC
  Administered 2012-04-09: 06:00:00 via INTRAVENOUS

## 2012-04-09 MED ORDER — ONDANSETRON HCL 4 MG PO TABS
4.0000 mg | ORAL_TABLET | Freq: Four times a day (QID) | ORAL | Status: DC | PRN
  Filled 2012-04-09: qty 1

## 2012-04-09 MED ORDER — SENNOSIDES-DOCUSATE SODIUM 8.6-50 MG PO TABS
1.0000 | ORAL_TABLET | Freq: Every morning | ORAL | Status: DC
  Administered 2012-04-09 – 2012-04-12 (×4): 1 via ORAL
  Filled 2012-04-09 (×4): qty 1

## 2012-04-09 MED ORDER — ALBUTEROL SULFATE HFA 108 (90 BASE) MCG/ACT IN AERS
2.0000 | INHALATION_SPRAY | RESPIRATORY_TRACT | Status: DC | PRN
  Filled 2012-04-09: qty 6.7

## 2012-04-09 MED ORDER — FENTANYL CITRATE 0.05 MG/ML IJ SOLN
50.0000 ug | INTRAMUSCULAR | Status: DC | PRN
  Administered 2012-04-09: 50 ug via INTRAVENOUS
  Filled 2012-04-09: qty 2

## 2012-04-09 MED ORDER — MORPHINE SULFATE ER 15 MG PO TBCR
15.0000 mg | EXTENDED_RELEASE_TABLET | Freq: Two times a day (BID) | ORAL | Status: DC
  Administered 2012-04-09 – 2012-04-12 (×7): 15 mg via ORAL
  Filled 2012-04-09 (×8): qty 1

## 2012-04-09 MED ORDER — DESONIDE 0.05 % EX CREA
TOPICAL_CREAM | Freq: Two times a day (BID) | CUTANEOUS | Status: DC
Start: 2012-04-09 — End: 2012-04-09

## 2012-04-09 MED ORDER — METOPROLOL TARTRATE 25 MG PO TABS
25.0000 mg | ORAL_TABLET | Freq: Every morning | ORAL | Status: DC
Start: 2012-04-09 — End: 2012-04-09
  Filled 2012-04-09: qty 1

## 2012-04-09 MED ORDER — BUDESONIDE-FORMOTEROL FUMARATE 80-4.5 MCG/ACT IN AERO
2.0000 | INHALATION_SPRAY | Freq: Two times a day (BID) | RESPIRATORY_TRACT | Status: DC
  Administered 2012-04-09 – 2012-04-12 (×7): 2 via RESPIRATORY_TRACT
  Filled 2012-04-09: qty 6.9

## 2012-04-09 MED ORDER — HYDROCORTISONE 1 % EX CREA
TOPICAL_CREAM | Freq: Two times a day (BID) | CUTANEOUS | Status: DC
  Administered 2012-04-09 – 2012-04-12 (×6): via TOPICAL
  Filled 2012-04-09: qty 28

## 2012-04-09 MED ORDER — PANTOPRAZOLE SODIUM 40 MG PO TBEC
40.0000 mg | DELAYED_RELEASE_TABLET | Freq: Every day | ORAL | Status: DC
  Administered 2012-04-09 – 2012-04-12 (×4): 40 mg via ORAL
  Filled 2012-04-09 (×4): qty 1

## 2012-04-09 MED ORDER — DEXTROSE 5 % IV SOLN
1.0000 g | INTRAVENOUS | Status: DC
  Administered 2012-04-09 – 2012-04-10 (×2): 1 g via INTRAVENOUS
  Filled 2012-04-09 (×2): qty 10

## 2012-04-09 MED ORDER — CHLORHEXIDINE GLUCONATE 0.12 % MT SOLN
10.0000 mL | Freq: Two times a day (BID) | OROMUCOSAL | Status: DC
  Administered 2012-04-09 – 2012-04-12 (×6): 10 mL via OROMUCOSAL
  Filled 2012-04-09 (×8): qty 15

## 2012-04-09 MED ORDER — ENOXAPARIN SODIUM 40 MG/0.4ML ~~LOC~~ SOLN
40.0000 mg | SUBCUTANEOUS | Status: DC
  Administered 2012-04-09 – 2012-04-12 (×4): 40 mg via SUBCUTANEOUS
  Filled 2012-04-09 (×4): qty 0.4

## 2012-04-09 MED ORDER — DULOXETINE HCL 60 MG PO CPEP
60.0000 mg | ORAL_CAPSULE | Freq: Every day | ORAL | Status: DC
  Administered 2012-04-09 – 2012-04-12 (×4): 60 mg via ORAL
  Filled 2012-04-09 (×4): qty 1
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
## 2012-04-09 NOTE — ED Notes (Signed)
Report to Fran RN.
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
## 2012-04-09 NOTE — Progress Notes (Signed)
Family Meeting Note  I met with the patient's wife [phone]), who clarified that the patient has slowly been getting weaker and weaker over time.  She brought the patient to the hospital for placement, hoping to admit to short-term SNF, with the hope of building up strength to eventually move back home with [facility] assistance.  The wife was informed that we also found evidence of UTI, which she states she had also suspected.  On admission, there was some question about whether the patient had a seizure, but patient's wife noted that she did not witness any limb shaking, loss of bowel/bladder, or post-ictal confusion.  Will follow-up with CSW regarding SNF placement.  The family was informed that it would be Monday at the earliest.  Awanda Mink, PGY2 04/09/2012, 12:28 PM

## 2012-04-09 NOTE — Evaluation (Signed)
Physical Therapy Evaluation Patient Details Name: Todd [name] MRN: [id number] DOB: [birth year] Today's Date: 04/09/2012 Time: 4098-1191 PT Time Calculation (min): 38 min  PT Assessment / Plan / Recommendation Clinical Impression  pt adm with progressive weakness and decr. ability to walk (with RW).  Pt can benefit from PT to maximize Independence.Marland Kitchen    PT Assessment  Patient needs continued PT services    Follow Up Recommendations  CIR    Does the patient have the potential to tolerate intense rehabilitation      Barriers to Discharge        Equipment Recommendations  None recommended by PT    Recommendations for Other Services Rehab consult   Frequency Min 3X/week    Precautions / Restrictions Precautions Precautions: Fall   Pertinent Vitals/Pain       Mobility  Bed Mobility Bed Mobility: Supine to Sit;Sitting - Scoot to [place] of Bed;Sit to Supine Supine to Sit: 3: Mod assist Sitting - Scoot to Edge of Bed: 4: Min assist Sit to Supine: 4: Min assist Details for Bed Mobility Assistance: v/t cues for direction, hand placement; truncal support to get up and gain forward momentum Transfers Transfers: Sit to Stand;Stand to Sit Sit to Stand: 4: Min assist;3: Mod assist;With upper extremity assist;From bed (times 3) Stand to Sit: 4: Min assist;With upper extremity assist;To bed Details for Transfer Assistance: v/t cues for hand placement, assist to help pt come forward over his BOS Ambulation/Gait Ambulation/Gait Assistance: 4: Min assist;3: Mod assist Ambulation Distance (Feet): 35 Feet (over the course of 8 min) Assistive device: Rolling walker Ambulation/Gait Assistance Details: gait stiff with excessive lateral w/shift, straight legs and small step length.  Pt keeps RW out much too far and frequently the RW needs to be held still for him to catch up..  Pt's gait less halting in nature if a rhythm of Left/right/left/right used. Gait Pattern: Step-through  pattern;Decreased step length - right;Decreased step length - left;Decreased stride length;Lateral trunk lean to right;Lateral trunk lean to left;Wide base of support Stairs: No    Shoulder Instructions     Exercises     PT Diagnosis: Abnormality of gait;Generalized weakness;Difficulty walking  PT Problem List: Decreased strength;Decreased balance;Decreased mobility;Decreased coordination;Decreased knowledge of use of DME PT Treatment Interventions: Gait training;Functional mobility training;Therapeutic activities;Balance training;Patient/family education   PT Goals Acute Rehab PT Goals PT Goal Formulation: With patient Time For Goal Achievement: 04/09/12 Potential to Achieve Goals: Good Pt will go Supine/Side to Sit: with supervision;with HOB not 0 degrees (comment degree) PT Goal: Supine/Side to Sit - Progress: Goal set today Pt will go Sit to Stand: with supervision PT Goal: Sit to Stand - Progress: Goal set today Pt will Transfer Bed to Chair/Chair to Bed: with supervision PT Transfer Goal: Bed to Chair/Chair to Bed - Progress: Goal set today Pt will Ambulate: 51 - 150 feet;with supervision;with rolling walker;with least restrictive assistive device PT Goal: Ambulate - Progress: Goal set today  Visit Information  Last PT Received On: 04/09/12 Assistance Needed: +1 (+2 for safety)    Subjective Data  Patient Stated Goal: GF wants him to get rehab to eventually get home   Prior Functioning  Home Living Lives With: Significant other Type of Home: Apartment Home Access: Stairs to enter Home Layout: Two level;Bed/bath upstairs Bathroom Shower/Tub: Engineer, manufacturing systems: Standard Home Adaptive Equipment: Walker - rolling Prior Function Level of Independence: Needs assistance Needs Assistance: Bathing;Dressing Communication Communication: Expressive difficulties Dominant Hand: Right    Cognition  Overall Cognitive Status: Impaired Area of Impairment:  Attention Arousal/Alertness: Awake/alert Orientation Level: Appears intact for tasks assessed Behavior During Session: [facility] for tasks performed Current Attention Level: Selective    Extremity/Trunk Assessment Right Upper Extremity Assessment RUE ROM/Strength/Tone: [facility] for tasks assessed Left Upper Extremity Assessment LUE ROM/Strength/Tone: WFL for tasks assessed (L mildly weaker than R) Right Lower Extremity Assessment RLE ROM/Strength/Tone: WFL for tasks assessed Left Lower Extremity Assessment LLE ROM/Strength/Tone: WFL for tasks assessed (L mildly weaker than R, Bilaterally decr coordination) Trunk Assessment Trunk Assessment:  (able to stand upright)   Balance Balance Balance Assessed: Yes Static Sitting Balance Static Sitting - Balance Support: Feet supported;Left upper extremity supported;Right upper extremity supported Static Sitting - Level of Assistance: 4: Min assist Static Sitting - Comment/# of Minutes: 5+ min Dynamic Standing Balance Dynamic Standing - Balance Support: During functional activity;Bilateral upper extremity supported Dynamic Standing - Level of Assistance: 4: Min assist  End of Session PT - End of Session Equipment Utilized During Treatment: Gait belt Activity Tolerance: Patient tolerated treatment well Patient left: in bed;with call bell/phone within reach Nurse Communication: Mobility status  GP     Kathleen Tamm, Eliseo Gum 04/09/2012, 5:32 PM  04/09/2012  [place], PT [phone] (pager)

## 2012-04-10 DIAGNOSIS — N390 Urinary tract infection, site not specified: Secondary | ICD-10-CM | POA: Diagnosis present

## 2012-04-10 LAB — BASIC METABOLIC PANEL
CO2: 27 mEq/L (ref 19–32)
Calcium: 8.6 mg/dL (ref 8.4–10.5)
Creatinine, Ser: 0.91 mg/dL (ref 0.50–1.35)
GFR calc non Af Amer: 88 mL/min — ABNORMAL LOW (ref >90)
Glucose, Bld: 108 mg/dL — ABNORMAL HIGH (ref 70–99)

## 2012-04-10 LAB — URINE CULTURE: Colony Count: NO GROWTH

## 2012-04-10 MED ORDER — CIPROFLOXACIN HCL 250 MG PO TABS
250.0000 mg | ORAL_TABLET | Freq: Two times a day (BID) | ORAL | Status: AC
  Administered 2012-04-10 – 2012-04-11 (×4): 250 mg via ORAL
  Filled 2012-04-10 (×5): qty 1
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
## 2012-04-11 DIAGNOSIS — N390 Urinary tract infection, site not specified: Principal | ICD-10-CM

## 2012-04-11 LAB — BASIC METABOLIC PANEL
BUN: 18 mg/dL (ref 6–23)
Calcium: 8.8 mg/dL (ref 8.4–10.5)
GFR calc Af Amer: >90 mL/min (ref >90)
GFR calc non Af Amer: 89 mL/min — ABNORMAL LOW (ref >90)
Glucose, Bld: 98 mg/dL (ref 70–99)
Sodium: 142 mEq/L (ref 135–145)

## 2012-04-11 LAB — VITAMIN D 25 HYDROXY (VIT D DEFICIENCY, FRACTURES): Vit D, 25-Hydroxy: 33 ng/mL (ref 30–89)

## 2012-04-11 NOTE — Clinical Social Work Psychosocial (Signed)
Clinical Social Work Department BRIEF PSYCHOSOCIAL ASSESSMENT 04/11/2012  Patient:  Todd [name],Todd [name]     Account Number:  [id number]     Admit date:  04/08/2012  Clinical Social Worker:  Nevin Kozuch, LCSW  Date/Time:  04/11/2012 05:13 AM  Referred by:  Physician  Date Referred:  04/09/2012 Referred for  SNF Placement   Other Referral:   CSW consults 12/14 and 12/15   Interview type:  Family Other interview type:    PSYCHOSOCIAL DATA Living Status:  FAMILY Admitted from facility:   Level of care:   Primary support name:  [name] Stallings Primary support relationship to patient:  SPOUSE Degree of support available:   Ms. Stallings appeared concerned about the care paitent needs.    CURRENT CONCERNS Current Concerns  Post-Acute Placement   Other Concerns:    SOCIAL WORK ASSESSMENT / PLAN CSW talked with Ms. Stalling's (patient's wife or significant other) regarding dishcarge plans. Ms. Stallings indicated that patient's needs skilled nursing for 2/3 months and her choice is [facility] as he has been there before and he is familiar with staff and they know him. She added that it is also very close to their home.   Assessment/plan status:  Psychosocial Support/Ongoing Assessment of Needs Other assessment/ plan:   Information/referral to community resources:    [name]/FAMILY'S RESPONSE TO PLAN OF CARE: Ms. Stallings was very receptive to talking to CSW and wants patient to get better/stronger before returning home. She expressed appreciation to CSW for assistance in getting patient to [facility].         

## 2012-04-11 NOTE — Clinical Social Work Placement (Addendum)
Clinical Social Work Department CLINICAL SOCIAL WORK PLACEMENT NOTE 04/11/2012  Patient:  Todd [name], Todd [name]  Account Number:  [id number] Admit date:  04/08/2012  Clinical Social Worker:  Genelle Bal, LCSW  Date/time:  04/11/2012 05:25 AM  Clinical Social Work is seeking post-discharge placement for this patient at the following level of care:   SKILLED NURSING   (*CSW will update this form in Epic as items are completed)     Patient/family provided with [facility] Department of Clinical Social Work's list of facilities offering this level of care within the geographic area requested by the patient (or if unable, by the patient's family).  04/11/2012  Patient/family informed of their freedom to choose among providers that offer the needed level of care, that participate in Medicare, Medicaid or managed care program needed by the patient, have an available bed and are willing to accept the patient.    Patient/family informed of [facility]' ownership interest in [facility], as well as of the fact that they are under no obligation to receive care at this facility.  PASARR submitted to EDS on 04/11/2012 PASARR number received from EDS on 04/11/12  FL2 transmitted to all facilities in geographic area requested by pt/family on  04/11/2012 FL2 transmitted to all facilities within larger geographic area on   Patient informed that his/her managed care company has contracts with or will negotiate with  certain facilities, including the following:     Patient/family informed of bed offers received: 04/11/12  Patient chooses bed at [facility] Physician recommends and patient chooses bed at    Patient to be transferred to [facility] on 04/12/12   Patient to be transferred to facility by ambulance  The following physician request were entered in Epic:   Additional Comments: 04/11/12 - Family's preference is [facility]. 04/12/12 - CSW talked with patient's g'friend Ms.  Stallings in the patient's room. She is aware of discharge and will complete admissions paperwork at SNF. CSW has completed patients medical packet and it will accompany him in transport.

## 2012-04-11 NOTE — Progress Notes (Signed)
Physical Therapy Treatment Patient Details Name: ADEBAYO ENSMINGER MRN: [id number] DOB: 1946-02-27 Today's Date: 04/11/2012 Time: 4098-1191 PT Time Calculation (min): 29 min  PT Assessment / Plan / Recommendation Comments on Treatment Session  Admitted with progressive weakness/UTI; Making gains with mobility, though slowly; Noted CIR unable to take pt -- in that case, recommend SNF for rehab to maximize independence and safety with mobility prior to dc home    Follow Up Recommendations  SNF     Does the patient have the potential to tolerate intense rehabilitation     Barriers to Discharge        Equipment Recommendations  None recommended by PT    Recommendations for Other Services    Frequency Min 3X/week   Plan Discharge plan needs to be updated    Precautions / Restrictions Precautions Precautions: Fall   Pertinent Vitals/Pain no apparent distress     Mobility  Bed Mobility Bed Mobility: Supine to Sit;Sitting - Scoot to Edge of Bed Supine to Sit: 3: Mod assist;With rails;HOB elevated Sitting - Scoot to Edge of Bed: 3: Mod assist Details for Bed Mobility Assistance: Verbal and tactile cues for technique, hand placement, and initiation of tasks; Required more phsyical assist to elevate trunk from bed, with facilitation at shoulder and pelvic girdles Transfers Transfers: Sit to Stand;Stand to Sit Sit to Stand: 3: Mod assist;From bed;With upper extremity assist Stand to Sit: 3: Mod assist;To chair/3-in-1;With upper extremity assist Details for Transfer Assistance: v/t cues for hand placement, assist to help pt come forward over his BOS; phsyical assist also necessary to steady RW Ambulation/Gait Ambulation/Gait Assistance: 1: +2 Total assist Ambulation/Gait: Patient Percentage: 60% Ambulation Distance (Feet): 40 Feet (over the course of 10-12 minutes) Assistive device: Rolling walker Ambulation/Gait Assistance Details: continued stiff gait with straight legs and  short step length and height bilaterally; Tends to take 2-3 steps then stop; somewhat less halting nature of gait when rhythmical verbal cueing (left/right/left) is used Gait Pattern: Step-to pattern;Decreased step length - right;Decreased step length - left;Decreased stance time - right;Decreased stance time - left;Decreased stride length;Wide base of support Gait velocity: slow and halting    Exercises     PT Diagnosis:    PT Problem List:   PT Treatment Interventions:     PT Goals Acute Rehab PT Goals Time For Goal Achievement: 04/23/12 (mis-entry on 12/14) Potential to Achieve Goals: Good Pt will go Supine/Side to Sit: with supervision;with HOB not 0 degrees (comment degree) PT Goal: Supine/Side to Sit - Progress: Progressing toward goal Pt will go Sit to Stand: with supervision PT Goal: Sit to Stand - Progress: Progressing toward goal Pt will Transfer Bed to Chair/Chair to Bed: with supervision PT Transfer Goal: Bed to Chair/Chair to Bed - Progress: Progressing toward goal Pt will Ambulate: 51 - 150 feet;with supervision;with rolling walker;with least restrictive assistive device PT Goal: Ambulate - Progress: Progressing toward goal  Visit Information  Last PT Received On: 04/11/12 Assistance Needed: +2 (for safety)    Subjective Data  Subjective: Essentially non-verbal, thoughable to indicate he wanted a drink after walking   Cognition  Overall Cognitive Status: History of cognitive impairments - at baseline Arousal/Alertness: Awake/alert Behavior During Session: [employer] for tasks performed Current Attention Level: Selective Cognition - Other Comments: At times trying to inappropriately touch caregivers -- not sure if this is pt's baseline    Balance     End of Session PT - End of Session Equipment Utilized During Treatment: Gait belt Activity Tolerance:  Patient tolerated treatment well Patient left: in chair;with chair alarm set (Dr. Aundria Rud in room) Nurse Communication:  Mobility status   GP     Van Clines [facility] [place], [place] [phone]  04/11/2012, 12:56 PM

## 2012-04-11 NOTE — Clinical Social Work Psychosocial (Deleted)
Clinical Social Work Department BRIEF PSYCHOSOCIAL ASSESSMENT 04/11/2012  Patient:  Todd [name], Todd [name]     Account Number:  [id number]     Admit date:  04/08/2012  Clinical Social Worker:  Delmer Islam  Date/Time:  04/11/2012 05:13 AM  Referred by:  Physician  Date Referred:  04/09/2012 Referred for  SNF Placement   Other Referral:   CSW consults 12/14 and 12/15   Interview type:  Family Other interview type:    PSYCHOSOCIAL DATA Living Status:  FAMILY Admitted from facility:   Level of care:   Primary support name:  Todd [name] Primary support relationship to patient:  SPOUSE Degree of support available:   Todd [name] appeared concerned about the care paitent needs.    CURRENT CONCERNS Current Concerns  Post-Acute Placement   Other Concerns:    SOCIAL WORK ASSESSMENT / PLAN CSW talked with Todd [name] (patient's wife or significant other) regarding dishcarge plans. Todd [name] indicated that patient's needs skilled nursing for 2/3 months and her choice is [name] as he has been there before and he is familiar with staff and they know him. She added that it is also very close to their home.   Assessment/plan status:  Psychosocial Support/Ongoing Assessment of Needs Other assessment/ plan:   Information/referral to community resources:    [name]/FAMILY'S RESPONSE TO PLAN OF CARE: Todd [name] was very receptive to talking to CSW and wants patient to get better/stronger before returning home. She expressed appreciation to CSW for assistance in getting patient to [facility].

## 2012-04-11 NOTE — Progress Notes (Signed)
IM Attending  No change.  Receiving PT.  Alert and seems cheerful.  Non-verbal.  Here for SNF placement.
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
## 2012-04-11 NOTE — Progress Notes (Signed)
Rehab Admissions Coordinator Note:  Patient was screened by Trish Mage for appropriateness for an Inpatient Acute Rehab Consult.  At this time, we are recommending Skilled Nursing Facility.  Trish Mage 04/11/2012, 8:59 AM  I can be reached at [phone].

## 2012-04-12 DIAGNOSIS — G8929 Other chronic pain: Secondary | ICD-10-CM

## 2012-04-12 DIAGNOSIS — M549 Dorsalgia, unspecified: Secondary | ICD-10-CM

## 2012-04-12 MED ORDER — INFLUENZA VIRUS VACC SPLIT PF IM SUSP
0.5000 mL | Freq: Once | INTRAMUSCULAR | Status: AC
  Administered 2012-04-12: 0.5 mL via INTRAMUSCULAR
  Filled 2012-04-12: qty 0.5
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
## 2012-04-12 NOTE — Progress Notes (Signed)
04/12/2012 influenza vaccine given in right deltoid at 1506. Lot# [id number] and Expire June 30th, 2014. Biochemist, clinical.

## 2012-04-12 NOTE — Discharge Summary (Signed)
Agree 
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
## 2012-04-14 ENCOUNTER — Encounter: Payer: Medicaid Other | Admitting: Internal Medicine

## 2012-07-22 ENCOUNTER — Encounter: Payer: Self-pay | Admitting: Nurse Practitioner

## 2012-07-22 ENCOUNTER — Non-Acute Institutional Stay (SKILLED_NURSING_FACILITY): Payer: Medicaid Other | Admitting: Nurse Practitioner

## 2012-07-22 DIAGNOSIS — M549 Dorsalgia, unspecified: Secondary | ICD-10-CM

## 2012-07-22 DIAGNOSIS — R5381 Other malaise: Secondary | ICD-10-CM

## 2012-07-22 DIAGNOSIS — G8929 Other chronic pain: Secondary | ICD-10-CM

## 2012-07-22 DIAGNOSIS — R5383 Other fatigue: Secondary | ICD-10-CM

## 2012-07-22 DIAGNOSIS — Z87820 Personal history of traumatic brain injury: Secondary | ICD-10-CM

## 2012-07-22 DIAGNOSIS — R531 Weakness: Secondary | ICD-10-CM

## 2012-07-22 DIAGNOSIS — R4701 Aphasia: Secondary | ICD-10-CM

## 2012-07-22 NOTE — Progress Notes (Signed)
Patient ID: Todd [name], male   DOB: 29-Nov-1945, 67 y.o.   MRN: [id number]  Chief Complaint: AV: evaluation for discharge   HPI:   67 year old male went to [facility] ER 04/08/2012 for generalized weakness. He was unable to communicate verbally, and seem to have severe speech disturbance. The complain is that he has progressively been unable to walk. He has right eye blindness. The patient's wife note several month history of generalized decline. They recently moved back to [place], to their condo which  is on the 2nd floor. The patient's wife notes that she mainly brought the patient to the hospital to get placement in SNF. Patient was evaluated by both PT and OT and felt that SNF would be appropriate placement. Patient was discharged 04/12/2012 to [facility] rehab. Now wife feels like she can take husband home and would like his discharged with home health   Review of Systems:  Review of Systems  Unable to perform ROS: other   pt with aphagia unable to provide history [name] Medications  New Prescriptions   No medications on file  Previous Medications   ESOMEPRAZOLE (NEXIUM) 20 MG CAPSULE    Take 1 capsule (20 mg total) by mouth daily before breakfast.  Modified Medications   Modified Medication Previous Medication   ALBUTEROL (VENTOLIN HFA) 108 (90 BASE) MCG/ACT INHALER albuterol (VENTOLIN HFA) 108 (90 BASE) MCG/ACT inhaler      Inhale 2 puffs into the lungs every 4 (four) hours as needed. For shortness of breath    Inhale 2 puffs into the lungs every 4 (four) hours as needed. For shortness of breath   BUDESONIDE-FORMOTEROL (SYMBICORT) 80-4.5 MCG/ACT INHALER budesonide-formoterol (SYMBICORT) 80-4.5 MCG/ACT inhaler      Inhale 2 puffs into the lungs 2 (two) times daily.    Inhale 2 puffs into the lungs 2 (two) times daily.   CHLORHEXIDINE (PERIDEX) 0.12 % SOLUTION chlorhexidine (PERIDEX) 0.12 % solution      Use as directed 240 mLs in the mouth or throat every morning.       CLOPIDOGREL (PLAVIX) 75 MG TABLET clopidogrel (PLAVIX) 75 MG tablet      Take 1 tablet (75 mg total) by mouth every morning.    Take 75 mg by mouth every morning.   DESONIDE (DESOWEN) 0.05 % CREAM desonide (DESOWEN) 0.05 % cream      Apply topically 2 (two) times daily. Apply to the affected area twice a day    Apply topically 2 (two) times daily. Apply to the affected area every night at bedtime.   DEXTROMETHORPHAN-GUAIFENESIN (MUCINEX DM) 30-600 MG PER 12 HR TABLET dextromethorphan-guaiFENesin (MUCINEX DM) 30-600 MG per 12 hr tablet      Take 1 tablet by mouth every 12 (twelve) hours.    Take 1 tablet by mouth every 12 (twelve) hours.     DIAPERS & SUPPLIES MISC Diapers & Supplies MISC      Please provide 120 diaper for use for patient's incontinence    Please provide 120 diaper for use for patient's incontinence    DULOXETINE (CYMBALTA) 60 MG CAPSULE DULoxetine (CYMBALTA) 60 MG capsule      Take 1 capsule (60 mg total) by mouth daily.    Take 60 mg by mouth daily.   FLUTICASONE (FLONASE) 50 MCG/ACT NASAL SPRAY fluticasone (FLONASE) 50 MCG/ACT nasal spray      Place 2 sprays into the nose daily.    Place 2 sprays into the nose daily.   IPRATROPIUM-ALBUTEROL (DUONEB) 0.5-2.5 (  3) MG/3ML SOLN ipratropium-albuterol (DUONEB) 0.5-2.5 (3) MG/3ML SOLN      Take 3 mLs by nebulization every 6 (six) hours as needed.    Take 3 mLs by nebulization every 6 (six) hours as needed.   METOPROLOL TARTRATE (LOPRESSOR) 25 MG TABLET metoprolol tartrate (LOPRESSOR) 25 MG tablet      Take 1 tablet (25 mg total) by mouth every morning.    Take 25 mg by mouth every morning.    OMEPRAZOLE (PRILOSEC) 40 MG CAPSULE omeprazole (PRILOSEC) 40 MG capsule      Take 1 capsule (40 mg total) by mouth daily.    Take 40 mg by mouth daily.   SACCHAROMYCES BOULARDII (FLORASTOR) 250 MG CAPSULE saccharomyces boulardii (FLORASTOR) 250 MG capsule      Take 1 capsule (250 mg total) by mouth 2 (two) times daily.    Take 250 mg by mouth 2  (two) times daily.   SENNA-DOCUSATE (SENOKOT-S) 8.6-50 MG PER TABLET senna-docusate (SENOKOT-S) 8.6-50 MG per tablet      Take 1 tablet by mouth every morning.    Take 1 tablet by mouth every morning.    TRAMADOL (ULTRAM) 50 MG TABLET traMADol (ULTRAM) 50 MG tablet      Take 1 tablet (50 mg total) by mouth every 6 (six) hours as needed for pain.    Take 50 mg by mouth every 6 (six) hours as needed for pain.  Discontinued Medications   MORPHINE (MS CONTIN) 15 MG 12 HR TABLET    Take 15 mg by mouth 2 (two) times daily.     Physical Exam: Physical Exam  Constitutional: He appears well-developed and well-nourished. No distress.  HENT:  Head: Normocephalic and atraumatic.  Eyes: EOM are normal. Pupils are equal, round, and reactive to light.  Neck: Normal range of motion. Neck supple.  Cardiovascular: Normal rate, regular rhythm and normal heart sounds.   Pulmonary/Chest: Effort normal and breath sounds normal.  Abdominal: Soft. Bowel sounds are normal.  Musculoskeletal: He exhibits no edema.  Stands and pivots Gait unsteady Uses wheelchair   Neurological: He is alert.  Skin: Skin is warm and dry. He is not diaphoretic.    Filed Vitals:   07/22/12 1552  BP: 141/62  Pulse: 69  Temp: 96.3 F (35.7 C)  Resp: 18      Labs reviewed: Basic Metabolic Panel:  Recent Labs  11/91/47 1705 04/10/12 1122 04/11/12 0655  NA 141 141 142  K 3.7 3.8 3.7  CL 107 106 107  CO2 24 27 27   GLUCOSE 122* 108* 98  BUN 17 17 18   CREATININE 0.89 0.91 0.87  CALCIUM 9.5 8.6 8.8    Liver Function Tests:  Recent Labs  04/08/12 1705  AST 27  ALT 12  ALKPHOS 56  BILITOT 1.0  PROT 7.3  ALBUMIN 3.9    CBC:  Recent Labs  08/10/11 0638 09/16/11 1840 04/08/12 1705  WBC 12.4* 10.1 7.4  NEUTROABS  --  7.2 5.2  HGB 13.0 14.7 16.3  HCT 39.6 44.2 49.1  MCV 99.2 99.3 98.2  PLT 189 250 233       Assessment/Plan 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 -stable   Personal  history of traumatic brain injury Causing debility will need additional home health services   Chronic back pain Stable on PRN tramadol   Generalized weakness Improved however will still need additional home health therapies     Goals of care: pt is stable for discharge-will need PT/OT/Nursing per  home health. No DME needed. Rx sent via epic to preferred pharmacy.  will need to follow up with PCP within 2 weeks. Paperwork filled out for Home health

## 2012-07-25 ENCOUNTER — Other Ambulatory Visit: Payer: Self-pay | Admitting: Nurse Practitioner

## 2012-07-25 ENCOUNTER — Other Ambulatory Visit: Payer: Self-pay | Admitting: *Deleted

## 2012-07-25 MED ORDER — METOPROLOL TARTRATE 25 MG PO TABS: 25.0000 mg | ORAL_TABLET | Freq: Every morning | ORAL | Status: DC

## 2012-07-25 MED ORDER — DM-GUAIFENESIN ER 30-600 MG PO TB12: 1.0000 | ORAL_TABLET | Freq: Two times a day (BID) | ORAL | Status: DC

## 2012-07-25 MED ORDER — DULOXETINE HCL 60 MG PO CPEP: 60.0000 mg | ORAL_CAPSULE | Freq: Every day | ORAL | Status: DC

## 2012-07-25 MED ORDER — IPRATROPIUM-ALBUTEROL 0.5-2.5 (3) MG/3ML IN SOLN: 3.0000 mL | Freq: Four times a day (QID) | RESPIRATORY_TRACT | Status: DC | PRN

## 2012-07-25 MED ORDER — OMEPRAZOLE 40 MG PO CPDR: 40.0000 mg | DELAYED_RELEASE_CAPSULE | Freq: Every day | ORAL | Status: DC

## 2012-07-25 MED ORDER — SENNOSIDES-DOCUSATE SODIUM 8.6-50 MG PO TABS: 1.0000 | ORAL_TABLET | Freq: Every morning | ORAL | Status: DC

## 2012-07-25 MED ORDER — FLUTICASONE PROPIONATE 50 MCG/ACT NA SUSP: 2.0000 | Freq: Every day | NASAL | Status: DC

## 2012-07-25 MED ORDER — CHLORHEXIDINE GLUCONATE 0.12 % MT SOLN: 240.0000 mL | Freq: Every morning | OROMUCOSAL | Status: DC

## 2012-07-25 MED ORDER — CLOPIDOGREL BISULFATE 75 MG PO TABS: 75.0000 mg | ORAL_TABLET | Freq: Every morning | ORAL | Status: DC

## 2012-07-25 MED ORDER — DIAPERS & SUPPLIES MISC: Status: DC

## 2012-07-25 MED ORDER — TRAMADOL HCL 50 MG PO TABS: 50.0000 mg | ORAL_TABLET | Freq: Four times a day (QID) | ORAL | Status: DC | PRN

## 2012-07-25 MED ORDER — SACCHAROMYCES BOULARDII 250 MG PO CAPS: 250.0000 mg | ORAL_CAPSULE | Freq: Two times a day (BID) | ORAL | Status: DC

## 2012-07-25 MED ORDER — ALBUTEROL SULFATE HFA 108 (90 BASE) MCG/ACT IN AERS: 2.0000 | INHALATION_SPRAY | RESPIRATORY_TRACT | Status: DC | PRN

## 2012-07-25 MED ORDER — DESONIDE 0.05 % EX CREA: TOPICAL_CREAM | Freq: Two times a day (BID) | CUTANEOUS | Status: DC

## 2012-07-25 MED ORDER — BUDESONIDE-FORMOTEROL FUMARATE 80-4.5 MCG/ACT IN AERO: 2.0000 | INHALATION_SPRAY | Freq: Two times a day (BID) | RESPIRATORY_TRACT | Status: DC

## 2012-07-25 NOTE — Assessment & Plan Note (Signed)
Causing debility will need additional home health services

## 2012-07-25 NOTE — Assessment & Plan Note (Signed)
Stable on PRN tramadol. 
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
## 2012-07-25 NOTE — Assessment & Plan Note (Signed)
Improved however will still need additional home health therapies

## 2012-08-17 ENCOUNTER — Ambulatory Visit (INDEPENDENT_AMBULATORY_CARE_PROVIDER_SITE_OTHER): Payer: Medicaid Other | Admitting: Internal Medicine

## 2012-08-17 ENCOUNTER — Encounter: Payer: Self-pay | Admitting: Internal Medicine

## 2012-08-17 VITALS — BP 121/84 | HR 78 | Ht 72.0 in | Wt 191.1 lb

## 2012-08-17 DIAGNOSIS — R531 Weakness: Secondary | ICD-10-CM

## 2012-08-17 DIAGNOSIS — M549 Dorsalgia, unspecified: Secondary | ICD-10-CM

## 2012-08-17 DIAGNOSIS — I2510 Atherosclerotic heart disease of native coronary artery without angina pectoris: Secondary | ICD-10-CM

## 2012-08-17 DIAGNOSIS — G8929 Other chronic pain: Secondary | ICD-10-CM

## 2012-08-17 DIAGNOSIS — J441 Chronic obstructive pulmonary disease with (acute) exacerbation: Secondary | ICD-10-CM

## 2012-08-17 DIAGNOSIS — J45909 Unspecified asthma, uncomplicated: Secondary | ICD-10-CM

## 2012-08-17 DIAGNOSIS — K279 Peptic ulcer, site unspecified, unspecified as acute or chronic, without hemorrhage or perforation: Secondary | ICD-10-CM

## 2012-08-17 DIAGNOSIS — R32 Unspecified urinary incontinence: Secondary | ICD-10-CM

## 2012-08-17 DIAGNOSIS — F322 Major depressive disorder, single episode, severe without psychotic features: Secondary | ICD-10-CM

## 2012-08-17 DIAGNOSIS — R5381 Other malaise: Secondary | ICD-10-CM

## 2012-08-17 DIAGNOSIS — J449 Chronic obstructive pulmonary disease, unspecified: Secondary | ICD-10-CM

## 2012-08-17 DIAGNOSIS — Z87820 Personal history of traumatic brain injury: Secondary | ICD-10-CM

## 2012-08-17 MED ORDER — OMEPRAZOLE 40 MG PO CPDR: 40.0000 mg | DELAYED_RELEASE_CAPSULE | Freq: Every day | ORAL | Status: DC

## 2012-08-17 MED ORDER — CLOPIDOGREL BISULFATE 75 MG PO TABS: 75.0000 mg | ORAL_TABLET | Freq: Every morning | ORAL | Status: DC

## 2012-08-17 MED ORDER — FLUTICASONE PROPIONATE 50 MCG/ACT NA SUSP: 2.0000 | Freq: Every day | NASAL | Status: DC

## 2012-08-17 MED ORDER — BUDESONIDE-FORMOTEROL FUMARATE 80-4.5 MCG/ACT IN AERO: 2.0000 | INHALATION_SPRAY | Freq: Two times a day (BID) | RESPIRATORY_TRACT | Status: DC

## 2012-08-17 MED ORDER — GUAIFENESIN ER 600 MG PO TB12: 600.0000 mg | ORAL_TABLET | Freq: Two times a day (BID) | ORAL | Status: DC

## 2012-08-17 MED ORDER — DIAPERS & SUPPLIES MISC: Status: DC

## 2012-08-17 MED ORDER — ALBUTEROL SULFATE HFA 108 (90 BASE) MCG/ACT IN AERS: 2.0000 | INHALATION_SPRAY | RESPIRATORY_TRACT | Status: DC | PRN

## 2012-08-17 MED ORDER — SENNOSIDES-DOCUSATE SODIUM 8.6-50 MG PO TABS: 1.0000 | ORAL_TABLET | Freq: Every morning | ORAL | Status: DC

## 2012-08-17 MED ORDER — TRAMADOL HCL 50 MG PO TABS: 50.0000 mg | ORAL_TABLET | Freq: Four times a day (QID) | ORAL | Status: DC | PRN

## 2012-08-17 MED ORDER — MORPHINE SULFATE ER 15 MG PO TBCR: 15.0000 mg | EXTENDED_RELEASE_TABLET | Freq: Two times a day (BID) | ORAL | Status: DC

## 2012-08-17 MED ORDER — CHLORHEXIDINE GLUCONATE 0.12 % MT SOLN: 240.0000 mL | Freq: Every morning | OROMUCOSAL | Status: DC

## 2012-08-17 MED ORDER — METOPROLOL TARTRATE 25 MG PO TABS: 25.0000 mg | ORAL_TABLET | Freq: Every morning | ORAL | Status: DC

## 2012-08-17 MED ORDER — OXYCODONE HCL 5 MG PO TABS: 5.0000 mg | ORAL_TABLET | ORAL | Status: AC | PRN

## 2012-08-17 MED ORDER — DULOXETINE HCL 60 MG PO CPEP: 60.0000 mg | ORAL_CAPSULE | Freq: Every day | ORAL | Status: DC

## 2012-08-17 MED ORDER — DESONIDE 0.05 % EX CREA: TOPICAL_CREAM | Freq: Two times a day (BID) | CUTANEOUS | Status: DC
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
## 2012-08-17 NOTE — Assessment & Plan Note (Signed)
H/o COPD, on Symbicort with albuterol inhaler which he uses a few times a week. Per his wife, he was given a nebulizer by [facility], but she is not sure if they have all of the equipment for it. She will call the clinic to let us know if she needs any further equipment for the nebulizer. - Continuing the Symbicort - Continuing the Albuterol inhaler PRN
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
## 2012-08-17 NOTE — Patient Instructions (Signed)
**  Continue to take all of your medications as prescribed.  **I have put a consult into our Social Work for the home health PT/OT/Nursing. I have also put in an order with your pharmacy for diapers and supplies.  **Look at the nebulizer that you have to see if all of teh parts were included, and call the clinic if you need a new one or any medications.  I will see you back in 6 months or sooner if needed.
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
## 2012-08-17 NOTE — Assessment & Plan Note (Addendum)
He is on Cymbalta and his depression appears to be well controlled. Will continue the Cymbalta. - Cymbalta 60mg  po daily

## 2012-08-18 ENCOUNTER — Telehealth: Payer: Self-pay | Admitting: Licensed Clinical Social Worker

## 2012-08-18 ENCOUNTER — Other Ambulatory Visit: Payer: Self-pay | Admitting: Licensed Clinical Social Worker

## 2012-08-18 DIAGNOSIS — R531 Weakness: Secondary | ICD-10-CM

## 2012-08-18 NOTE — Telephone Encounter (Signed)
Todd [name] was referred to CSW for home health services.  Pt agency of choice is [facility].  Pt/family provided contact name at agency.  CSW placed call to Amy at [facility].  Amy states pt had order for [facility] PT/OT/RN upon d/c from SNF, but pt went out of town.  Pt is now back in town and would like to start services.  [facility] in need of home health order and face to face.  CSW faxed referral and face to face to [facility] home care with start date of 08/19/2012 per family request to agency.

## 2012-08-20 NOTE — Progress Notes (Signed)
Case discussed with Dr. Sherrine Maples at the time of the visit. We reviewed the resident's history and exam and pertinent patient test results. I agree with the assessment, diagnosis and plan of care documented in the resident's note.  It is very unlikely he is receiving any benefit from the MS Contin by using it PRN.  I will ask Dr. Sherrine Maples to discuss this with the patient and his wife.  They can decide how they would like to use it.  They can take it on a twice daily basis and not PRN, which may be ideal, as regular use may decrease the number of times he develops severe pain, or he may simply decide to discontinue the MS Contin altogether.  Given his chronic pain, I feel taking it regularly (twice a day no matter what) is likely to provide better control of his chronic pain in the long run.

## 2012-09-04 ENCOUNTER — Emergency Department (HOSPITAL_COMMUNITY)
Admission: EM | Admit: 2012-09-04 | Discharge: 2012-09-06 | Disposition: A | Discharge status: Skilled Nursing Facility | Payer: Medicare Other | Attending: Emergency Medicine | Admitting: Emergency Medicine

## 2012-09-04 ENCOUNTER — Encounter (HOSPITAL_COMMUNITY): Payer: Self-pay

## 2012-09-04 DIAGNOSIS — Z87828 Personal history of other (healed) physical injury and trauma: Secondary | ICD-10-CM | POA: Insufficient documentation

## 2012-09-04 DIAGNOSIS — I69920 Aphasia following unspecified cerebrovascular disease: Secondary | ICD-10-CM | POA: Insufficient documentation

## 2012-09-04 DIAGNOSIS — R531 Weakness: Secondary | ICD-10-CM

## 2012-09-04 DIAGNOSIS — R5383 Other fatigue: Secondary | ICD-10-CM | POA: Insufficient documentation

## 2012-09-04 DIAGNOSIS — Z79899 Other long term (current) drug therapy: Secondary | ICD-10-CM | POA: Insufficient documentation

## 2012-09-04 DIAGNOSIS — R29810 Facial weakness: Secondary | ICD-10-CM | POA: Insufficient documentation

## 2012-09-04 DIAGNOSIS — Z8669 Personal history of other diseases of the nervous system and sense organs: Secondary | ICD-10-CM | POA: Insufficient documentation

## 2012-09-04 DIAGNOSIS — Z8709 Personal history of other diseases of the respiratory system: Secondary | ICD-10-CM | POA: Insufficient documentation

## 2012-09-04 DIAGNOSIS — Z8711 Personal history of peptic ulcer disease: Secondary | ICD-10-CM | POA: Insufficient documentation

## 2012-09-04 DIAGNOSIS — Z85828 Personal history of other malignant neoplasm of skin: Secondary | ICD-10-CM | POA: Insufficient documentation

## 2012-09-04 DIAGNOSIS — K219 Gastro-esophageal reflux disease without esophagitis: Secondary | ICD-10-CM | POA: Insufficient documentation

## 2012-09-04 DIAGNOSIS — M810 Age-related osteoporosis without current pathological fracture: Secondary | ICD-10-CM | POA: Insufficient documentation

## 2012-09-04 DIAGNOSIS — R5381 Other malaise: Secondary | ICD-10-CM | POA: Insufficient documentation

## 2012-09-04 DIAGNOSIS — Z8619 Personal history of other infectious and parasitic diseases: Secondary | ICD-10-CM | POA: Insufficient documentation

## 2012-09-04 DIAGNOSIS — Z87820 Personal history of traumatic brain injury: Secondary | ICD-10-CM | POA: Insufficient documentation

## 2012-09-04 DIAGNOSIS — Z7902 Long term (current) use of antithrombotics/antiplatelets: Secondary | ICD-10-CM | POA: Insufficient documentation

## 2012-09-04 LAB — COMPREHENSIVE METABOLIC PANEL
Albumin: 3.9 g/dL (ref 3.5–5.2)
BUN: 17 mg/dL (ref 6–23)
Creatinine, Ser: 0.87 mg/dL (ref 0.50–1.35)
Total Protein: 6.9 g/dL (ref 6.0–8.3)

## 2012-09-04 LAB — CBC WITH DIFFERENTIAL/PLATELET
Basophils Relative: 0 % (ref 0–1)
Eosinophils Absolute: 0.2 10*3/uL (ref 0.0–0.7)
HCT: 44.4 % (ref 39.0–52.0)
Hemoglobin: 15.4 g/dL (ref 13.0–17.0)
MCH: 33.1 pg (ref 26.0–34.0)
MCHC: 34.7 g/dL (ref 30.0–36.0)
Monocytes Absolute: 0.6 10*3/uL (ref 0.1–1.0)
Monocytes Relative: 9 % (ref 3–12)
Neutrophils Relative %: 59 % (ref 43–77)

## 2012-09-04 LAB — URINALYSIS, ROUTINE W REFLEX MICROSCOPIC
Bilirubin Urine: NEGATIVE
Glucose, UA: NEGATIVE mg/dL
Hgb urine dipstick: NEGATIVE
Nitrite: NEGATIVE
Specific Gravity, Urine: 1.006 (ref 1.005–1.030)
pH: 7 (ref 5.0–8.0)

## 2012-09-04 LAB — RAPID URINE DRUG SCREEN, HOSP PERFORMED
Benzodiazepines: NOT DETECTED
Cocaine: NOT DETECTED
Opiates: NOT DETECTED
Tetrahydrocannabinol: NOT DETECTED
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
## 2012-09-04 NOTE — ED Notes (Signed)
Pt assisted to use a urinal and a sample gotten, pt undressed and placed on a gown. Vitals taken and pt placed on the 12 leads,
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
## 2012-09-04 NOTE — ED Notes (Signed)
Pt wife does not want this RN to assess pt at this time, wants to wait for MD to come to bedside. Pt in NAD at this time.
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
## 2012-09-05 NOTE — Progress Notes (Signed)
Received call from D.Griffin with Adult protective services. Contact information is office-[phone]/cell- [phone]. CSW notified of APS workers request to talk.
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
## 2012-09-06 NOTE — ED Provider Notes (Addendum)
Todd [name] is a 67 y.o. male here with placement. No reasons for admission. SW working on placement. No issues as per nursing. Comfortably sleeping this AM.    Richardean Canal, MD 09/06/12 0736  11:36 AM Patient accepted at [facility]. Stable for d/c.   Richardean Canal, MD 09/06/12 1136

## 2012-09-06 NOTE — Progress Notes (Signed)
Clinical Social Work Department CLINICAL SOCIAL WORK PLACEMENT NOTE 09/06/2012  Patient:  Todd [name], Todd [name]  Account Number:  [id number] Admit date:  09/04/2012  Clinical Social Worker:  Leron Croak, CLINICAL SOCIAL WORKER  Date/time:  09/06/2012 11:37 AM  Clinical Social Work is seeking post-discharge placement for this patient at the following level of care:   SKILLED NURSING   (*CSW will update this form in Epic as items are completed)   09/06/2012  Patient/family provided with [facility] Department of Clinical Social Work's list of facilities offering this level of care within the geographic area requested by the patient (or if unable, by the patient's family).  09/06/2012  Patient/family informed of their freedom to choose among providers that offer the needed level of care, that participate in Medicare, Medicaid or managed care program needed by the patient, have an available bed and are willing to accept the patient.  09/06/2012  Patient/family informed of [facility]' ownership interest in [facility], as well as of the fact that they are under no obligation to receive care at this facility.  PASARR submitted to EDS on 01/05/2007 PASARR number received from EDS on 01/05/2007  FL2 transmitted to all facilities in geographic area requested by pt/family on  09/06/2012 FL2 transmitted to all facilities within larger geographic area on 09/06/2012  Patient informed that his/her managed care company has contracts with or will negotiate with  certain facilities, including the following:     Patient/family informed of bed offers received:   Patient chooses bed at [facility] Physician recommends and patient chooses bed at    Patient to be transferred to [facility] on  09/06/2012 Patient to be transferred to facility by [facility]  The following physician request were entered in Epic:   Additional Comments: Leron Croak,  [facility] Emergency Dept.  [phone]

## 2012-09-06 NOTE — Progress Notes (Signed)
CSW contacted the wife Olegario Messier C)  and APS worker (D.Griffin office-[phone]/cell- [phone].) to inform them of the d/c plan to transfer to [facility].   No further needs at this time.   Leron Croak, LCSWA [facility] Emergency Dept.  [phone]

## 2012-09-06 NOTE — Progress Notes (Signed)
CSW received call back from [place] at [facility] stating that Pt is clear to be transported.  Leron Croak, LCSWA [facility] Emergency Dept.  [phone]

## 2012-09-08 ENCOUNTER — Non-Acute Institutional Stay (SKILLED_NURSING_FACILITY): Payer: Medicaid Other | Admitting: Nurse Practitioner

## 2012-09-08 ENCOUNTER — Encounter: Payer: Self-pay | Admitting: Nurse Practitioner

## 2012-09-08 ENCOUNTER — Other Ambulatory Visit: Payer: Self-pay | Admitting: Geriatric Medicine

## 2012-09-08 DIAGNOSIS — J449 Chronic obstructive pulmonary disease, unspecified: Secondary | ICD-10-CM

## 2012-09-08 DIAGNOSIS — M549 Dorsalgia, unspecified: Secondary | ICD-10-CM

## 2012-09-08 DIAGNOSIS — I498 Other specified cardiac arrhythmias: Secondary | ICD-10-CM

## 2012-09-08 DIAGNOSIS — G8929 Other chronic pain: Secondary | ICD-10-CM

## 2012-09-08 MED ORDER — MORPHINE SULFATE ER 15 MG PO TBCR: 15.0000 mg | EXTENDED_RELEASE_TABLET | Freq: Two times a day (BID) | ORAL | Status: DC
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
## 2012-09-08 NOTE — Assessment & Plan Note (Signed)
Pt currently not in pain and staff reports pt does not complain of pain. pt was previously at [facility] he was only on tramadol as needed for pain. MS contin was not needed. Will stop this medication now and cont to monitor for pain.
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
## 2012-09-12 ENCOUNTER — Ambulatory Visit: Payer: Self-pay | Admitting: Nurse Practitioner

## 2012-09-19 NOTE — Assessment & Plan Note (Signed)
Patient is stable; continue current medications. Will monitor and make changes as necessary.

## 2012-09-19 NOTE — Assessment & Plan Note (Signed)
Patient is stable; continue current regimen. Will monitor and make changes as necessary.  

## 2012-10-12 ENCOUNTER — Encounter: Payer: Self-pay | Admitting: Nurse Practitioner

## 2012-10-12 ENCOUNTER — Non-Acute Institutional Stay (SKILLED_NURSING_FACILITY): Payer: Medicaid Other | Admitting: Nurse Practitioner

## 2012-10-12 DIAGNOSIS — G8929 Other chronic pain: Secondary | ICD-10-CM

## 2012-10-12 DIAGNOSIS — R531 Weakness: Secondary | ICD-10-CM

## 2012-10-12 DIAGNOSIS — R4701 Aphasia: Secondary | ICD-10-CM

## 2012-10-12 DIAGNOSIS — M549 Dorsalgia, unspecified: Secondary | ICD-10-CM

## 2012-10-12 DIAGNOSIS — R5383 Other fatigue: Secondary | ICD-10-CM

## 2012-10-12 DIAGNOSIS — R5381 Other malaise: Secondary | ICD-10-CM
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
## 2012-11-02 ENCOUNTER — Non-Acute Institutional Stay (SKILLED_NURSING_FACILITY): Payer: Medicaid Other | Admitting: Nurse Practitioner

## 2012-11-02 ENCOUNTER — Encounter: Payer: Self-pay | Admitting: Nurse Practitioner

## 2012-11-02 DIAGNOSIS — J189 Pneumonia, unspecified organism: Secondary | ICD-10-CM

## 2012-11-02 NOTE — Progress Notes (Signed)
Patient ID: Todd [name], male   DOB: [birth year], 67 y.o.   MRN: [id number]  Nursing Home Location:  [facility]   Place of Service: SNF (31)   Chief Complaint: Acute visit   HPI:  67 year old male seen today to follow up pneumonia. pt had xray on 10/30/12 which results stated could not rule out pnemonia- pt has been on levaquin 500mg  for 3 days. Still with low grade fevers, wheezing and O2 sats in the upper 80s to low 90s. Pt reports shortness of breath without improvement, staff reports ongoing productive cough and decrease PO intake.   Review of Systems:  Unable to perform ROS due to pt with severe aphagia  Medications: [name] Medications  New Prescriptions   No medications on file  Previous Medications   ALBUTEROL (VENTOLIN HFA) 108 (90 BASE) MCG/ACT INHALER    Inhale 2 puffs into the lungs every 4 (four) hours as needed. For shortness of breath   BUDESONIDE-FORMOTEROL (SYMBICORT) 80-4.5 MCG/ACT INHALER    Inhale 2 puffs into the lungs 2 (two) times daily.   CHLORHEXIDINE (PERIDEX) 0.12 % SOLUTION    Use as directed 240 mLs in the mouth or throat every morning.   CLOPIDOGREL (PLAVIX) 75 MG TABLET    Take 1 tablet (75 mg total) by mouth every morning.   DESONIDE (DESOWEN) 0.05 % CREAM    Apply topically 2 (two) times daily. Apply to the affected area twice a day   DIAPERS & SUPPLIES MISC    Please provide 120 diaper for use for patient's incontinence   DULOXETINE (CYMBALTA) 60 MG CAPSULE    Take 1 capsule (60 mg total) by mouth daily.   FLUTICASONE (FLONASE) 50 MCG/ACT NASAL SPRAY    Place 2 sprays into the nose daily.   GUAIFENESIN (MUCINEX) 600 MG 12 HR TABLET    Take 600 mg by mouth 2 (two) times daily.   IPRATROPIUM-ALBUTEROL (DUONEB) 0.5-2.5 (3) MG/3ML SOLN    Take 3 mLs by nebulization every 6 (six) hours as needed. For wheezing   METOPROLOL TARTRATE (LOPRESSOR) 25 MG TABLET    Take 1 tablet (25 mg total) by mouth every morning.   OMEPRAZOLE (PRILOSEC) 40  MG CAPSULE    Take 1 capsule (40 mg total) by mouth daily.   SACCHAROMYCES BOULARDII (FLORASTOR) 250 MG CAPSULE    Take 250 mg by mouth daily.   SENNA-DOCUSATE (SENOKOT-S) 8.6-50 MG PER TABLET    Take 1 tablet by mouth daily.   TRAMADOL (ULTRAM) 50 MG TABLET    Take 1 tablet (50 mg total) by mouth every 6 (six) hours as needed for pain.  Modified Medications   No medications on file  Discontinued Medications   No medications on file     Physical Exam:  Filed Vitals:   11/02/12 1020  BP: 108/67  Pulse: 103  Temp: 99.4 F (37.4 C)  Resp: 22  SpO2: 91%    Physical Exam  Constitutional: He is well-developed, well-nourished, and in no distress. He has a sickly appearance. No distress.  HENT:  Head: Normocephalic and atraumatic.  Mouth/Throat: Oropharynx is clear and moist. No oropharyngeal exudate.  Eyes: Conjunctivae and EOM are normal. Pupils are equal, round, and reactive to light.  Neck: Normal range of motion. Neck supple. No thyromegaly present.  Cardiovascular: Normal rate, regular rhythm and normal heart sounds.   Pulmonary/Chest: Effort normal. No respiratory distress. He has decreased breath sounds. He has rhonchi.  Abdominal: Soft. Bowel sounds are  normal. He exhibits no distension.  Lymphadenopathy:    He has no cervical adenopathy.  Neurological: He is alert.  Skin: Skin is warm and dry. He is not diaphoretic.     Assessment/Plan  Pnemonia  Will increase Levaquin to 750 mg daily for 7 days  duonebs q 6 hours for 7 days  florastor BID for 2 weeks  mucniex DM BID for 2 weeks  Prednisone 40 mg daily for 5 days  .  BMP and CBC

## 2012-11-08 ENCOUNTER — Non-Acute Institutional Stay (SKILLED_NURSING_FACILITY): Payer: Medicare Other | Admitting: Nurse Practitioner

## 2012-11-08 ENCOUNTER — Encounter: Payer: Self-pay | Admitting: Nurse Practitioner

## 2012-11-08 DIAGNOSIS — J189 Pneumonia, unspecified organism: Secondary | ICD-10-CM

## 2012-11-08 DIAGNOSIS — J449 Chronic obstructive pulmonary disease, unspecified: Secondary | ICD-10-CM

## 2012-11-08 DIAGNOSIS — R531 Weakness: Secondary | ICD-10-CM

## 2012-11-08 DIAGNOSIS — R5381 Other malaise: Secondary | ICD-10-CM

## 2012-11-08 NOTE — Progress Notes (Signed)
Patient ID: Todd [name], male   DOB: Jul 10, 1945, 67 y.o.   MRN: [id number]  Nursing Home Location:  [facility]   Place of Service: SNF (31)  Chief Complaint  Patient presents with  . Medical Managment of Chronic Issues    HPI:  This is a 67 year old pleasant male with a PMH of Aphasia, secondary to traumatic brain injury, back pain, and generalized weakness who is being seen today for routine follow up. Pt recently seen and treated for pneumonia this has improved significantly.Pt still with cough but Pt remains afebrile and without noted shortness of breath.  Otherwise there is no concerns from staff and pt is without complaints.   Review of Systems:  Unable to obtain ROS  Medications: [name] Medications  New Prescriptions   No medications on file  Previous Medications   ALBUTEROL (VENTOLIN HFA) 108 (90 BASE) MCG/ACT INHALER    Inhale 2 puffs into the lungs every 4 (four) hours as needed. For shortness of breath   BUDESONIDE-FORMOTEROL (SYMBICORT) 80-4.5 MCG/ACT INHALER    Inhale 2 puffs into the lungs 2 (two) times daily.   CHLORHEXIDINE (PERIDEX) 0.12 % SOLUTION    Use as directed 240 mLs in the mouth or throat every morning.   CLOPIDOGREL (PLAVIX) 75 MG TABLET    Take 1 tablet (75 mg total) by mouth every morning.   DESONIDE (DESOWEN) 0.05 % CREAM    Apply topically 2 (two) times daily. Apply to the affected area twice a day   DIAPERS & SUPPLIES MISC    Please provide 120 diaper for use for patient's incontinence   DULOXETINE (CYMBALTA) 60 MG CAPSULE    Take 1 capsule (60 mg total) by mouth daily.   FLUTICASONE (FLONASE) 50 MCG/ACT NASAL SPRAY    Place 2 sprays into the nose daily.   GUAIFENESIN (MUCINEX) 600 MG 12 HR TABLET    Take 600 mg by mouth 2 (two) times daily.   IPRATROPIUM-ALBUTEROL (DUONEB) 0.5-2.5 (3) MG/3ML SOLN    Take 3 mLs by nebulization every 6 (six) hours as needed. For wheezing   METOPROLOL TARTRATE (LOPRESSOR) 25 MG TABLET    Take 1  tablet (25 mg total) by mouth every morning.   OMEPRAZOLE (PRILOSEC) 40 MG CAPSULE    Take 1 capsule (40 mg total) by mouth daily.   SACCHAROMYCES BOULARDII (FLORASTOR) 250 MG CAPSULE    Take 250 mg by mouth daily.   SENNA-DOCUSATE (SENOKOT-S) 8.6-50 MG PER TABLET    Take 1 tablet by mouth daily.   TRAMADOL (ULTRAM) 50 MG TABLET    Take 1 tablet (50 mg total) by mouth every 6 (six) hours as needed for pain.  Modified Medications   No medications on file  Discontinued Medications   No medications on file     Physical Exam:  Filed Vitals:   11/08/12 1728  BP: 98/69  Pulse: 76  Temp: 97.6 F (36.4 C)  Resp: 20  SpO2: 94%    GENERAL APPEARANCE: Alert, Appropriately groomed. No acute distress.  SKIN: No diaphoresis rash, or wounds HEAD: Normocephalic, atraumatic  EYES: Conjunctiva/lids clear. Pupils round, reactive. EOMs intact.  EARS: External exam WNL. Hearing grossly normal.  NOSE: No deformity or discharge.  MOUTH/THROAT: Lips w/o lesions. Mouth and throat normal. Tongue moist, w/o lesion.  NECK: No thyroid tenderness, enlargement or nodule  RESPIRATORY: Breathing is even, unlabored. Lung sounds are clear   CARDIOVASCULAR: Heart RRR no murmurs, rubs or gallops. No peripheral edema.  ARTERIAL: radial pulse 2+, DP pulse 1+  VENOUS: No varicosities. No venous stasis skin changes  GASTROINTESTINAL: Abdomen is soft, non-tender, not distended w/ normal bowel sounds. GENITOURINARY: Bladder non tender, not distended  MUSCULOSKELETAL: No abnormal joints or musculature NEUROLOGIC: Oriented to self Cranial nerves 2-12 grossly intact. Moves all extremities no tremor. PSYCHIATRIC: Mood and affect appropriate to situation, no behavioral issues  Labs reviewed/Significant Diagnostic Results: CBC with Diff       Result: 11/02/2012 3:44 PM    ( Status: F )            WBC  11.5     H  4.0-10.5  K/uL  SLN       RBC  3.97     L  4.22-5.81  MIL/uL  SLN       Hemoglobin  12.5     L  13.0-17.0   g/dL  SLN       Hematocrit  36.4     L  39.0-52.0  %  SLN       MCV  91.7        78.0-100.0  fL  SLN       MCH  31.5        26.0-34.0  pg  SLN       MCHC  34.3        30.0-36.0  g/dL  SLN       RDW  40.9        11.5-15.5  %  SLN       Platelet Count  364        150-400  K/uL  SLN       Granulocyte %  70        43-77  %  SLN       Absolute Gran  8.1     H  1.7-7.7  K/uL  SLN       Lymph %  17        12-46  %  SLN       Absolute Lymph  1.9        0.7-4.0  K/uL  SLN       Mono %  12        3-12  %  SLN       Absolute Mono  1.3     H  0.1-1.0  K/uL  SLN       Eos %  0        0-5  %  SLN       Absolute Eos  0.1        0.0-0.7  K/uL  SLN       Baso %  1        0-1  %  SLN       Absolute Baso  0.1        0.0-0.1  K/uL  SLN       Smear Review    SLN  C    Comprehensive Metabolic Panel       Result: 11/02/2012 2:43 PM    ( Status: F )            Sodium  143        135-145  mEq/L  SLN       Potassium  3.7        3.5-5.3  mEq/L  SLN       Chloride  107  96-112  mEq/L  SLN       CO2  25        19-32  mEq/L  SLN       Glucose  117     H  70-99  mg/dL  SLN       BUN  18        6-23  mg/dL  SLN       Creatinine  0.81        0.50-1.35  mg/dL  SLN       Bilirubin, Total  0.7        0.3-1.2  mg/dL  SLN       Alkaline Phosphatase  43        39-117  U/L  SLN       AST/SGOT  32        0-37  U/L  SLN       ALT/SGPT  14        0-53  U/L  SLN       Total Protein  6.1        6.0-8.3  g/dL  SLN       Albumin  3.2     L  3.5-5.2  g/dL  SLN       Calcium  8.6        8.4-10.5  mg/dL  SLN           Assessment/Plan 1. Chronic airway obstruction, not elsewhere classified Stable at this time  2. Pneumonia Resoved- left with cough- cont mucinex DM   3. Generalized weakness Worsened by pneumonia however currently improving

## 2012-12-07 ENCOUNTER — Non-Acute Institutional Stay (SKILLED_NURSING_FACILITY): Payer: Medicare Other | Admitting: Nurse Practitioner

## 2012-12-07 ENCOUNTER — Encounter: Payer: Self-pay | Admitting: Nurse Practitioner

## 2012-12-07 DIAGNOSIS — K219 Gastro-esophageal reflux disease without esophagitis: Secondary | ICD-10-CM

## 2012-12-07 DIAGNOSIS — F322 Major depressive disorder, single episode, severe without psychotic features: Secondary | ICD-10-CM

## 2012-12-07 DIAGNOSIS — I2510 Atherosclerotic heart disease of native coronary artery without angina pectoris: Secondary | ICD-10-CM

## 2012-12-07 DIAGNOSIS — J4489 Other specified chronic obstructive pulmonary disease: Secondary | ICD-10-CM

## 2012-12-07 DIAGNOSIS — K5900 Constipation, unspecified: Secondary | ICD-10-CM

## 2012-12-07 DIAGNOSIS — J449 Chronic obstructive pulmonary disease, unspecified: Secondary | ICD-10-CM

## 2012-12-07 NOTE — Progress Notes (Signed)
Patient ID: Todd [name], male   DOB: 1946-02-27, 67 y.o.   MRN: [id number]  Nursing Home Location:  [facility]   Place of Service: SNF (31)  Chief Complaint  Patient presents with  . Medical Managment of Chronic Issues    HPI:  This is a 67 year old pleasant male with a PMH of Aphasia, secondary to traumatic brain injury, back pain, and generalized weakness who is being seen today for routine follow up. there is no concerns from staff and pt is without complaints.  Reassessment of ongoing issues  COPD: taking symbicort twice daily and PRN duonebs, no noted shortness of breath, pt tolerating medication  Depression: no noted mood changes; on cymbalta 30 mg daily Constipation: stable on senokot S CAD: takes plavix daily Tachycardia- no noted tachycardia heartrate stable on metoprolol 25 mg BID  Allergic rhinitis: stable on flonase nasal spray GERD: stable on omeprazole  Back pain: no complaints of pain; has PRn tramadol  Review of Systems:  Unable to get ROS due to aphasia   Medications: [name] Medications  New Prescriptions   No medications on file  Previous Medications   ALBUTEROL (VENTOLIN HFA) 108 (90 BASE) MCG/ACT INHALER    Inhale 2 puffs into the lungs every 4 (four) hours as needed. For shortness of breath   BUDESONIDE-FORMOTEROL (SYMBICORT) 80-4.5 MCG/ACT INHALER    Inhale 2 puffs into the lungs 2 (two) times daily.   CHLORHEXIDINE (PERIDEX) 0.12 % SOLUTION    Use as directed 240 mLs in the mouth or throat every morning.   CLOPIDOGREL (PLAVIX) 75 MG TABLET    Take 1 tablet (75 mg total) by mouth every morning.   DESONIDE (DESOWEN) 0.05 % CREAM    Apply topically 2 (two) times daily. Apply to the affected area twice a day   FLUTICASONE (FLONASE) 50 MCG/ACT NASAL SPRAY    Place 2 sprays into the nose daily.   IPRATROPIUM-ALBUTEROL (DUONEB) 0.5-2.5 (3) MG/3ML SOLN    Take 3 mLs by nebulization every 6 (six) hours as needed. For wheezing   METOPROLOL  TARTRATE (LOPRESSOR) 25 MG TABLET    Take 1 tablet (25 mg total) by mouth every morning.   OMEPRAZOLE (PRILOSEC) 40 MG CAPSULE    Take 1 capsule (40 mg total) by mouth daily.   SENNA-DOCUSATE (SENOKOT-S) 8.6-50 MG PER TABLET    Take 1 tablet by mouth daily.   TRAMADOL (ULTRAM) 50 MG TABLET    Take 1 tablet (50 mg total) by mouth every 6 (six) hours as needed for pain.  Modified Medications   Modified Medication Previous Medication   DULOXETINE (CYMBALTA) 60 MG CAPSULE DULoxetine (CYMBALTA) 60 MG capsule      Take 30 mg by mouth daily.    Take 1 capsule (60 mg total) by mouth daily.  Discontinued Medications   DIAPERS & SUPPLIES MISC    Please provide 120 diaper for use for patient's incontinence   GUAIFENESIN (MUCINEX) 600 MG 12 HR TABLET    Take 600 mg by mouth 2 (two) times daily.   SACCHAROMYCES BOULARDII (FLORASTOR) 250 MG CAPSULE    Take 250 mg by mouth daily.     Physical Exam:  Filed Vitals:   12/07/12 1026  BP: 132/81  Pulse: 82  Temp: 98.2 F (36.8 C)  Resp: 18     GENERAL APPEARANCE: Alert and pleasant male No acute distress.  SKIN: No diaphoresis rash, or wounds HEAD: Normocephalic, atraumatic  EYES: Conjunctiva/lids clear. Pupils round, reactive.  EOMs intact.  EARS: External exam WNL. Hearing grossly normal.  NOSE: No deformity or discharge.  MOUTH/THROAT: Lips w/o lesions. Mouth and throat normal. Tongue moist, w/o lesion.  NECK: No thyroid tenderness, enlargement or nodule  RESPIRATORY: Breathing is even, unlabored. Lung sounds are clear   CARDIOVASCULAR: Heart RRR no murmurs, rubs or gallops. No peripheral edema.  ARTERIAL: radial pulse 2+, DP pulse 1+  VENOUS: No varicosities. No venous stasis skin changes  GASTROINTESTINAL: Abdomen is soft, non-tender, not distended w/ normal bowel sounds. No mass, ventral or inguinal hernia. No organomegally GENITOURINARY: Bladder non tender, not distended  MUSCULOSKELETAL: No abnormal joints or musculature NEUROLOGIC:  Oriented to self Cranial nerves 2-12 grossly intact. Moves all extremities no tremor. PSYCHIATRIC: Mood and affect appropriate to situation, no behavioral issues  Labs reviewed/Significant Diagnostic Results: CBC with Diff  Result: 11/02/2012 3:44 PM ( Status: F )  WBC 11.5 H 4.0-10.5 K/uL SLN  RBC 3.97 L 4.22-5.81 MIL/uL SLN  Hemoglobin 12.5 L 13.0-17.0 g/dL SLN  Hematocrit 16.1 L 39.0-52.0 % SLN  MCV 91.7 78.0-100.0 fL SLN  MCH 31.5 26.0-34.0 pg SLN  MCHC 34.3 30.0-36.0 g/dL SLN  RDW 09.6 04.5-40.9 % SLN  Platelet Count 364 150-400 K/uL SLN  Granulocyte % 70 43-77 % SLN  Absolute Gran 8.1 H 1.7-7.7 K/uL SLN  Lymph % 17 12-46 % SLN  Absolute Lymph 1.9 0.7-4.0 K/uL SLN  Mono % 12 3-12 % SLN  Absolute Mono 1.3 H 0.1-1.0 K/uL SLN  Eos % 0 0-5 % SLN  Absolute Eos 0.1 0.0-0.7 K/uL SLN  Baso % 1 0-1 % SLN  Absolute Baso 0.1 0.0-0.1 K/uL SLN  Smear Review SLN C  Comprehensive Metabolic Panel  Result: 11/02/2012 2:43 PM ( Status: F )  Sodium 143 135-145 mEq/L SLN  Potassium 3.7 3.5-5.3 mEq/L SLN  Chloride 107 96-112 mEq/L SLN  CO2 25 19-32 mEq/L SLN  Glucose 117 H 70-99 mg/dL SLN  BUN 18 8-11 mg/dL SLN  Creatinine 9.14 7.82-9.56 mg/dL SLN  Bilirubin, Total 0.7 0.3-1.2 mg/dL SLN  Alkaline Phosphatase 43 39-117 U/L SLN  AST/SGOT 32 0-37 U/L SLN  ALT/SGPT 14 0-53 U/L SLN  Total Protein 6.1 6.0-8.3 g/dL SLN  Albumin 3.2 L 2.1-3.0 g/dL SLN  Calcium 8.6 8.6-57.8 mg/dL SLN      Assessment/Plan 1. Coronary atherosclerosis of native coronary artery Patient is stable; continue current regimen. Will monitor and make changes as necessary.  2. Chronic airway obstruction, not elsewhere classified Patients pulmonary status is stable; continue current regimen. Will monitor and make changes as necessary.  3. DEPRESSION, MAJOR, SEVERE Mood and behaviors appropriate. Will cont current medication   4. Unspecified constipation Stable at present  5. GERD (gastroesophageal reflux  disease) pts acid reflux is stable; will cont medication

## 2012-12-12 ENCOUNTER — Other Ambulatory Visit: Payer: Self-pay | Admitting: Geriatric Medicine

## 2012-12-12 DIAGNOSIS — Z87820 Personal history of traumatic brain injury: Secondary | ICD-10-CM

## 2012-12-12 DIAGNOSIS — G8929 Other chronic pain: Secondary | ICD-10-CM

## 2012-12-12 MED ORDER — TRAMADOL HCL 50 MG PO TABS: 50.0000 mg | ORAL_TABLET | Freq: Four times a day (QID) | ORAL | Status: DC | PRN

## 2013-01-04 ENCOUNTER — Encounter: Payer: Self-pay | Admitting: Nurse Practitioner

## 2013-01-04 ENCOUNTER — Non-Acute Institutional Stay (SKILLED_NURSING_FACILITY): Payer: Medicare Other | Admitting: Nurse Practitioner

## 2013-01-04 DIAGNOSIS — K5900 Constipation, unspecified: Secondary | ICD-10-CM

## 2013-01-04 DIAGNOSIS — I10 Essential (primary) hypertension: Secondary | ICD-10-CM

## 2013-01-04 DIAGNOSIS — M549 Dorsalgia, unspecified: Secondary | ICD-10-CM

## 2013-01-04 DIAGNOSIS — J449 Chronic obstructive pulmonary disease, unspecified: Secondary | ICD-10-CM

## 2013-01-04 DIAGNOSIS — G8929 Other chronic pain: Secondary | ICD-10-CM

## 2013-01-04 NOTE — Progress Notes (Signed)
Patient ID: Todd [name], male   DOB: 01/29/1946, 67 y.o.   MRN: [id number]  Nursing Home Location:  [facility]   Place of Service: SNF (31)  Chief Complaint  Patient presents with  . 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 there is no concerns from staff and pt is without complaints.  Reassessment of ongoing issues  COPD: taking symbicort twice daily and PRN duonebs, no noted shortness of breath, pt tolerating medication  Depression: no noted mood changes; on cymbalta 30 mg daily  Constipation: stable on senokot S  CAD: takes plavix daily  Tachycardia- no noted tachycardia heartrate stable on metoprolol 25 mg BID  Allergic rhinitis: stable on flonase nasal spray  GERD: stable on omeprazole  Back pain: no complaints of pain; has as needed tramadol    Review of Systems:   Unable to obtain ROS  Medications: [name] Medications  New Prescriptions   No medications on file  Previous Medications   BUDESONIDE-FORMOTEROL (SYMBICORT) 80-4.5 MCG/ACT INHALER    Inhale 2 puffs into the lungs 2 (two) times daily.   CHLORHEXIDINE (PERIDEX) 0.12 % SOLUTION    Use as directed 240 mLs in the mouth or throat every morning.   CLOPIDOGREL (PLAVIX) 75 MG TABLET    Take 1 tablet (75 mg total) by mouth every morning.   DESONIDE (DESOWEN) 0.05 % CREAM    Apply topically 2 (two) times daily. Apply to the affected area twice a day   DULOXETINE (CYMBALTA) 60 MG CAPSULE    Take 30 mg by mouth daily.   FLUTICASONE (FLONASE) 50 MCG/ACT NASAL SPRAY    Place 2 sprays into the nose daily.   IPRATROPIUM-ALBUTEROL (DUONEB) 0.5-2.5 (3) MG/3ML SOLN    Take 3 mLs by nebulization every 6 (six) hours as needed. For wheezing   METOPROLOL TARTRATE (LOPRESSOR) 25 MG TABLET    Take 1  tablet (25 mg total) by mouth every morning.   OMEPRAZOLE (PRILOSEC) 40 MG CAPSULE    Take 1 capsule (40 mg total) by mouth daily.   SENNA-DOCUSATE (SENOKOT-S) 8.6-50 MG PER TABLET    Take 1 tablet by mouth daily.   TRAMADOL (ULTRAM) 50 MG TABLET    Take 1 tablet (50 mg total) by mouth every 6 (six) hours as needed for pain.  Modified Medications   No medications on file  Discontinued Medications   No medications on file     Physical Exam:  Filed Vitals:   01/04/13 1057  BP: 148/57  Pulse: 89  Temp: 96.4 F (35.8 C)  Resp: 20  Weight: 200 lb (90.719 kg)     GENERAL APPEARANCE: Alert, conversant. Appropriately groomed. No acute distress.  SKIN: No diaphoresis rash, or wounds HEAD: Normocephalic, atraumatic  EYES: Conjunctiva/lids clear. Pupils round, reactive. EOMs intact.  EARS: External exam WNL  NOSE: No deformity or discharge.  MOUTH/THROAT: Lips w/o lesions. Mouth and throat normal. Tongue moist, w/o lesion.  NECK: No thyroid tenderness, enlargement or nodule  RESPIRATORY: Breathing is even, unlabored. Lung sounds are clear   CARDIOVASCULAR: Heart RRR no murmurs, rubs or gallops. No peripheral edema.  ARTERIAL: radial pulse 2+ GASTROINTESTINAL: Abdomen is soft, non-tender, not distended w/ normal bowel sounds. GENITOURINARY: Bladder non tender, not distended  MUSCULOSKELETAL: No abnormal joints  or musculature NEUROLOGIC: Oriented to self . Cranial nerves 2-12 grossly intact. Moves all extremities no tremor. PSYCHIATRIC: Mood and affect appropriate to situation, no behavioral issues  Labs reviewed/Significant Diagnostic Results: CBC with Diff  Result: 11/02/2012 3:44 PM ( Status: F )  WBC 11.5 H 4.0-10.5 K/uL SLN  RBC 3.97 L 4.22-5.81 MIL/uL SLN  Hemoglobin 12.5 L 13.0-17.0 g/dL SLN  Hematocrit 16.1 L 39.0-52.0 % SLN  MCV 91.7 78.0-100.0 fL SLN  MCH 31.5 26.0-34.0 pg SLN  MCHC 34.3 30.0-36.0 g/dL SLN  RDW 09.6 04.5-40.9 % SLN  Platelet Count 364 150-400 K/uL SLN   Granulocyte % 70 43-77 % SLN  Absolute Gran 8.1 H 1.7-7.7 K/uL SLN  Lymph % 17 12-46 % SLN  Absolute Lymph 1.9 0.7-4.0 K/uL SLN  Mono % 12 3-12 % SLN  Absolute Mono 1.3 H 0.1-1.0 K/uL SLN  Eos % 0 0-5 % SLN  Absolute Eos 0.1 0.0-0.7 K/uL SLN  Baso % 1 0-1 % SLN  Absolute Baso 0.1 0.0-0.1 K/uL SLN  Smear Review SLN C  Comprehensive Metabolic Panel  Result: 11/02/2012 2:43 PM ( Status: F )  Sodium 143 135-145 mEq/L SLN  Potassium 3.7 3.5-5.3 mEq/L SLN  Chloride 107 96-112 mEq/L SLN  CO2 25 19-32 mEq/L SLN  Glucose 117 H 70-99 mg/dL SLN  BUN 18 8-11 mg/dL SLN  Creatinine 9.14 7.82-9.56 mg/dL SLN  Bilirubin, Total 0.7 0.3-1.2 mg/dL SLN  Alkaline Phosphatase 43 39-117 U/L SLN  AST/SGOT 32 0-37 U/L SLN  ALT/SGPT 14 0-53 U/L SLN  Total Protein 6.1 6.0-8.3 g/dL SLN  Albumin 3.2 L 2.1-3.0 g/dL SLN  Calcium 8.6 8.6-57.8 mg/dL SLN    CBC with Diff       Result: 12/03/2012 2:01 AM    ( Status: F )       C     WBC  6.9        4.0-10.5  K/uL  SLN       RBC  4.45        4.22-5.81  MIL/uL  SLN       Hemoglobin  14.3        13.0-17.0  g/dL  SLN       Hematocrit  41.6        39.0-52.0  %  SLN       MCV  93.5        78.0-100.0  fL  SLN       MCH  32.1        26.0-34.0  pg  SLN       MCHC  34.4        30.0-36.0  g/dL  SLN       RDW  46.9        11.5-15.5  %  SLN       Platelet Count  258        150-400  K/uL  SLN       Granulocyte %  55        43-77  %  SLN       Absolute Gran  3.8        1.7-7.7  K/uL  SLN       Lymph %  32        12-46  %  SLN       Absolute Lymph  2.2        0.7-4.0  K/uL  SLN       Mono %  8  3-12  %  SLN       Absolute Mono  0.6        0.1-1.0  K/uL  SLN       Eos %  4        0-5  %  SLN       Absolute Eos  0.2        0.0-0.7  K/uL  SLN       Baso %  1        0-1  %  SLN       Absolute Baso  0.0        0.0-0.1  K/uL  SLN       Smear Review  NOTE   SLN  C    Comprehensive Metabolic Panel       Result: 12/02/2012 11:09 PM    ( Status: F )            Sodium  142         135-145  mEq/L  SLN       Potassium  4.2        3.5-5.3  mEq/L  SLN       Chloride  109        96-112  mEq/L  SLN       CO2  25        19-32  mEq/L  SLN       Glucose  90        70-99  mg/dL  SLN       BUN  20        6-23  mg/dL  SLN       Creatinine  0.86        0.50-1.35  mg/dL  SLN       Bilirubin, Total  0.6        0.3-1.2  mg/dL  SLN       Alkaline Phosphatase  48        39-117  U/L  SLN       AST/SGOT  21        0-37  U/L  SLN       ALT/SGPT  9        0-53  U/L  SLN       Total Protein  6.2        6.0-8.3  g/dL  SLN       Albumin  3.7        3.5-5.2  g/dL  SLN       Calcium  8.8        8.4-10.5  mg/dL  SLN         Assessment/Plan 1. Chronic airway obstruction, not elsewhere classified Patient is stable; continue current regimen. Will monitor and make changes as necessary.  2. Chronic back pain Does not complain of pain; stable on current as needed medications  3. Unspecified constipation stable  4. Essential hypertension, benign HR and BP Stable on metoprolol . Will cont to monitor and make changes as needed

## 2013-01-18 ENCOUNTER — Other Ambulatory Visit: Payer: Self-pay | Admitting: *Deleted

## 2013-01-18 ENCOUNTER — Non-Acute Institutional Stay (SKILLED_NURSING_FACILITY): Payer: Medicare Other | Admitting: Nurse Practitioner

## 2013-01-18 ENCOUNTER — Other Ambulatory Visit (HOSPITAL_COMMUNITY): Payer: Self-pay

## 2013-01-18 DIAGNOSIS — R569 Unspecified convulsions: Secondary | ICD-10-CM

## 2013-01-18 DIAGNOSIS — J309 Allergic rhinitis, unspecified: Secondary | ICD-10-CM

## 2013-01-18 MED ORDER — LORAZEPAM 2 MG/ML IJ SOLN: INTRAMUSCULAR | Status: DC
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
## 2013-01-25 ENCOUNTER — Ambulatory Visit (HOSPITAL_COMMUNITY)
Admission: RE | Admit: 2013-01-25 | Discharge: 2013-01-25 | Disposition: A | Discharge status: Home / Self Care | Payer: Medicare Other | Source: Ambulatory Visit | Attending: Internal Medicine | Admitting: Internal Medicine

## 2013-01-25 DIAGNOSIS — R9401 Abnormal electroencephalogram [EEG]: Secondary | ICD-10-CM | POA: Insufficient documentation

## 2013-01-25 DIAGNOSIS — R569 Unspecified convulsions: Secondary | ICD-10-CM

## 2013-01-25 DIAGNOSIS — Z87820 Personal history of traumatic brain injury: Secondary | ICD-10-CM | POA: Insufficient documentation

## 2013-01-25 NOTE — Procedures (Signed)
History: 67 year old male with previous traumatic brain injury presented with seizures.  Sedation:   Background: There is a breach rhythm seen over the right. He has focal slow activity most notable at T8, P8 as well as superimposed fast activity that occasionally has sharply contoured waves in bed and it. He does have a well defined posterior dominant rhythm of 8.5 Hz that attenuates with eye opening.   Photic stimulation: Physiologic driving is not performed  EEG Abnormalities: 1) right temporal sharp waves 2) bitemporal irregular delta and theta activity 3) breach rhythm seen on the right  Clinical Interpretation: This EEG is consistent with an area of focal epileptogenic potential in the right temporal region. There is also evidence of a focal cerebral dysfunction in this region. No seizures were recorded  Ritta Slot, MD [facility] [phone]  If 7pm- 7am, please page neurology on call at [phone].

## 2013-01-25 NOTE — Progress Notes (Signed)
EEG Completed; Results Pending  

## 2013-01-31 ENCOUNTER — Non-Acute Institutional Stay (SKILLED_NURSING_FACILITY): Payer: Medicare Other | Admitting: Nurse Practitioner

## 2013-01-31 ENCOUNTER — Encounter: Payer: Self-pay | Admitting: Nurse Practitioner

## 2013-01-31 DIAGNOSIS — J309 Allergic rhinitis, unspecified: Secondary | ICD-10-CM

## 2013-01-31 DIAGNOSIS — K5900 Constipation, unspecified: Secondary | ICD-10-CM

## 2013-01-31 DIAGNOSIS — R569 Unspecified convulsions: Secondary | ICD-10-CM

## 2013-01-31 DIAGNOSIS — J449 Chronic obstructive pulmonary disease, unspecified: Secondary | ICD-10-CM
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
## 2013-02-13 ENCOUNTER — Non-Acute Institutional Stay (SKILLED_NURSING_FACILITY): Payer: Medicare Other | Admitting: Nurse Practitioner

## 2013-02-13 DIAGNOSIS — J449 Chronic obstructive pulmonary disease, unspecified: Secondary | ICD-10-CM

## 2013-02-13 DIAGNOSIS — K5900 Constipation, unspecified: Secondary | ICD-10-CM

## 2013-02-13 DIAGNOSIS — M549 Dorsalgia, unspecified: Secondary | ICD-10-CM

## 2013-02-13 DIAGNOSIS — I498 Other specified cardiac arrhythmias: Secondary | ICD-10-CM

## 2013-02-13 DIAGNOSIS — J309 Allergic rhinitis, unspecified: Secondary | ICD-10-CM

## 2013-02-13 DIAGNOSIS — F322 Major depressive disorder, single episode, severe without psychotic features: Secondary | ICD-10-CM

## 2013-02-13 DIAGNOSIS — R569 Unspecified convulsions: Secondary | ICD-10-CM

## 2013-02-13 DIAGNOSIS — G8929 Other chronic pain: Secondary | ICD-10-CM

## 2013-02-13 DIAGNOSIS — I2510 Atherosclerotic heart disease of native coronary artery without angina pectoris: Secondary | ICD-10-CM
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
## 2013-02-17 ENCOUNTER — Ambulatory Visit: Payer: Medicare Other | Admitting: Internal Medicine

## 2013-02-24 ENCOUNTER — Ambulatory Visit (INDEPENDENT_AMBULATORY_CARE_PROVIDER_SITE_OTHER): Payer: Medicare Other | Admitting: Internal Medicine

## 2013-02-24 ENCOUNTER — Encounter: Payer: Self-pay | Admitting: Internal Medicine

## 2013-02-24 ENCOUNTER — Encounter: Payer: Self-pay | Admitting: Licensed Clinical Social Worker

## 2013-02-24 VITALS — BP 105/72 | HR 75 | Temp 97.8°F | Ht 72.0 in | Wt 205.0 lb

## 2013-02-24 DIAGNOSIS — K5900 Constipation, unspecified: Secondary | ICD-10-CM

## 2013-02-24 DIAGNOSIS — M549 Dorsalgia, unspecified: Secondary | ICD-10-CM

## 2013-02-24 DIAGNOSIS — J449 Chronic obstructive pulmonary disease, unspecified: Secondary | ICD-10-CM

## 2013-02-24 DIAGNOSIS — G8929 Other chronic pain: Secondary | ICD-10-CM

## 2013-02-24 DIAGNOSIS — Z87820 Personal history of traumatic brain injury: Secondary | ICD-10-CM

## 2013-02-24 MED ORDER — DESONIDE 0.05 % EX CREA: TOPICAL_CREAM | Freq: Two times a day (BID) | CUTANEOUS | Status: DC

## 2013-02-24 MED ORDER — TIOTROPIUM BROMIDE MONOHYDRATE 18 MCG IN CAPS: 18.0000 ug | ORAL_CAPSULE | Freq: Every day | RESPIRATORY_TRACT | Status: DC

## 2013-02-24 MED ORDER — POLYETHYLENE GLYCOL 3350 17 G PO PACK: 17.0000 g | PACK | Freq: Every day | ORAL | Status: DC

## 2013-02-24 MED ORDER — IPRATROPIUM-ALBUTEROL 0.5-2.5 (3) MG/3ML IN SOLN: 3.0000 mL | Freq: Four times a day (QID) | RESPIRATORY_TRACT | Status: DC | PRN

## 2013-02-24 MED ORDER — BUDESONIDE-FORMOTEROL FUMARATE 80-4.5 MCG/ACT IN AERO: 2.0000 | INHALATION_SPRAY | Freq: Two times a day (BID) | RESPIRATORY_TRACT | Status: DC

## 2013-02-24 MED ORDER — ALBUTEROL SULFATE HFA 108 (90 BASE) MCG/ACT IN AERS: 2.0000 | INHALATION_SPRAY | Freq: Four times a day (QID) | RESPIRATORY_TRACT | Status: DC | PRN

## 2013-02-24 MED ORDER — DESLORATADINE 5 MG PO TABS: 5.0000 mg | ORAL_TABLET | Freq: Every day | ORAL | Status: DC

## 2013-02-24 MED ORDER — AMOXICILLIN 875 MG PO TABS: 875.0000 mg | ORAL_TABLET | Freq: Two times a day (BID) | ORAL | Status: DC

## 2013-02-24 NOTE — Assessment & Plan Note (Signed)
On exam the patient does not appear to be in pain. However, his wife states that she's not giving her tramadol because it does not work, and he has severe pain even taking the tramadol. She states that he is not allowed to take NSAIDs, but is requesting stronger pain medication for him. He was admitted to [facility] he did not require any narcotic pain medication, and was put on tramadol only, which he has been on for the past 5 months per their records. I told his wife that did not for comfortable adding a narcotic when he has required one over the past 5 months. She was very adamant that he needed stronger pain medication, and multiple times said "we don't abuse pain medication." I asked the patient if he was having pain, and he nodded his head yes. When asked where his pain was located, he pointed to the top of his head. He told his wife that if she feels that he really does need stronger pain medication, maybe we need to refer him to a pain specialist. They will follow up with me in clinic in the next few weeks, we'll readdress this at that time. The fact he hasn't required any narcotic pain medication over the past 5 months, and given what was reported in the medical record from the ED stay back in May, I'm not prescribing narcotics, and he has to continue to take the tramadol Q8 hours as needed for pain.

## 2013-02-24 NOTE — Assessment & Plan Note (Addendum)
Patient is on Symbicort, and his wife is giving him Duonebs 2 times a day in addition to generic Mucinex. Per his wife, since his discharge from the rehabilitation facility on 10/20, he has had a productive cough that has not seemed to improve. She requests an antibiotic for the patient. On exam he has a productive cough, and is diffusely rhonchorous. Because this has been going on for over a week, might have a touch of bronchitis, and I will treat him with an antibiotic. I told his wife that she is to continue with the guaifenesin, Duonebs, and use nasal saline spray.  - Amoxicillin 875 mg one tablet twice a day x7 days, dispense #14, 0 refills

## 2013-02-24 NOTE — Progress Notes (Signed)
CSW met with pt and spouse during [facility] appt.  CSW needed to update information.  Pt had [facility] in the past for [facility] but spouse states due to change in income pt no longer qualifies for PCS.  Todd [name] has Medicare and limited Medicaid benefits.  CSW offered [facility] RN services, spouse declines stating the only in-home need at this time is personal care/aide.  CSW discussed [facility], spouse agreeable to referral but aware of the wait list.  CSW faxed referral to [facility].  At some point in pt's chart APS was mentioned to be actively following Todd [name], however, at this time pt does not have an active/open case with APS.  CSW provided spouse with [facility] brochure and confirm with PCP if spouse was agreeable.

## 2013-02-24 NOTE — Assessment & Plan Note (Signed)
He is currently taking Senokot S, but despite that he is only having a bowel movement every 3 days. Recommended to his wife that he begin taking MiraLax and see if this improves his constipation. Also with the amoxicillin, this could also cause loose stools which will also constipation as a secondary effect.
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
## 2013-03-02 ENCOUNTER — Other Ambulatory Visit: Payer: Self-pay

## 2013-03-06 ENCOUNTER — Telehealth: Payer: Self-pay | Admitting: *Deleted

## 2013-03-06 NOTE — Telephone Encounter (Signed)
Pt's spouse left a message for [facility]. Requesting a refill on albuterol, pt has active refills of this med [facility]. Triage attempted to call pt and find out what pharmacy they prefer but got no answer nor vmail at either ph#

## 2013-03-21 ENCOUNTER — Telehealth: Payer: Self-pay | Admitting: Licensed Clinical Social Worker

## 2013-03-21 NOTE — Telephone Encounter (Signed)
CSW spoke with RN Care Manager, Jodean Lima.  Ms.  Maisie Fus states pt's spouse, Ms. Creta Levin has decline RN care management services but has accepted CSW services.  Ms. Creta Levin states she has now returned to work and has shared with [facility], Mr. Verlan Friends is a [place].  [facility] will inquire about adult diaper cost assistance and potential VA benefits.

## 2013-04-30 ENCOUNTER — Inpatient Hospital Stay (HOSPITAL_COMMUNITY)
Admission: EM | Admit: 2013-04-30 | Discharge: 2013-05-05 | DRG: 871 | Disposition: A | Discharge status: Home Health | Payer: Medicare Other | Attending: Internal Medicine | Admitting: Internal Medicine

## 2013-04-30 ENCOUNTER — Emergency Department (HOSPITAL_COMMUNITY): Payer: Medicare Other

## 2013-04-30 ENCOUNTER — Encounter (HOSPITAL_COMMUNITY): Payer: Self-pay | Admitting: Emergency Medicine

## 2013-04-30 DIAGNOSIS — R0902 Hypoxemia: Secondary | ICD-10-CM

## 2013-04-30 DIAGNOSIS — Z85828 Personal history of other malignant neoplasm of skin: Secondary | ICD-10-CM

## 2013-04-30 DIAGNOSIS — Z8673 Personal history of transient ischemic attack (TIA), and cerebral infarction without residual deficits: Secondary | ICD-10-CM

## 2013-04-30 DIAGNOSIS — F3289 Other specified depressive episodes: Secondary | ICD-10-CM | POA: Diagnosis present

## 2013-04-30 DIAGNOSIS — S2249XA Multiple fractures of ribs, unspecified side, initial encounter for closed fracture: Secondary | ICD-10-CM | POA: Diagnosis present

## 2013-04-30 DIAGNOSIS — J9600 Acute respiratory failure, unspecified whether with hypoxia or hypercapnia: Secondary | ICD-10-CM | POA: Diagnosis present

## 2013-04-30 DIAGNOSIS — R32 Unspecified urinary incontinence: Secondary | ICD-10-CM | POA: Diagnosis present

## 2013-04-30 DIAGNOSIS — B1920 Unspecified viral hepatitis C without hepatic coma: Secondary | ICD-10-CM | POA: Diagnosis present

## 2013-04-30 DIAGNOSIS — S22009A Unspecified fracture of unspecified thoracic vertebra, initial encounter for closed fracture: Secondary | ICD-10-CM | POA: Diagnosis present

## 2013-04-30 DIAGNOSIS — R4701 Aphasia: Secondary | ICD-10-CM | POA: Diagnosis present

## 2013-04-30 DIAGNOSIS — I10 Essential (primary) hypertension: Secondary | ICD-10-CM | POA: Diagnosis present

## 2013-04-30 DIAGNOSIS — E870 Hyperosmolality and hypernatremia: Secondary | ICD-10-CM

## 2013-04-30 DIAGNOSIS — I2510 Atherosclerotic heart disease of native coronary artery without angina pectoris: Secondary | ICD-10-CM | POA: Diagnosis present

## 2013-04-30 DIAGNOSIS — S32009A Unspecified fracture of unspecified lumbar vertebra, initial encounter for closed fracture: Secondary | ICD-10-CM | POA: Diagnosis present

## 2013-04-30 DIAGNOSIS — J690 Pneumonitis due to inhalation of food and vomit: Secondary | ICD-10-CM | POA: Diagnosis present

## 2013-04-30 DIAGNOSIS — S2232XA Fracture of one rib, left side, initial encounter for closed fracture: Secondary | ICD-10-CM

## 2013-04-30 DIAGNOSIS — A419 Sepsis, unspecified organism: Principal | ICD-10-CM | POA: Diagnosis present

## 2013-04-30 DIAGNOSIS — J9601 Acute respiratory failure with hypoxia: Secondary | ICD-10-CM

## 2013-04-30 DIAGNOSIS — J811 Chronic pulmonary edema: Secondary | ICD-10-CM | POA: Diagnosis present

## 2013-04-30 DIAGNOSIS — F329 Major depressive disorder, single episode, unspecified: Secondary | ICD-10-CM | POA: Diagnosis present

## 2013-04-30 DIAGNOSIS — Z8711 Personal history of peptic ulcer disease: Secondary | ICD-10-CM

## 2013-04-30 DIAGNOSIS — Z7902 Long term (current) use of antithrombotics/antiplatelets: Secondary | ICD-10-CM

## 2013-04-30 DIAGNOSIS — K219 Gastro-esophageal reflux disease without esophagitis: Secondary | ICD-10-CM | POA: Diagnosis present

## 2013-04-30 DIAGNOSIS — S2232XS Fracture of one rib, left side, sequela: Secondary | ICD-10-CM

## 2013-04-30 DIAGNOSIS — R159 Full incontinence of feces: Secondary | ICD-10-CM | POA: Diagnosis present

## 2013-04-30 DIAGNOSIS — Z79899 Other long term (current) drug therapy: Secondary | ICD-10-CM

## 2013-04-30 DIAGNOSIS — L0291 Cutaneous abscess, unspecified: Secondary | ICD-10-CM | POA: Diagnosis present

## 2013-04-30 DIAGNOSIS — L723 Sebaceous cyst: Secondary | ICD-10-CM

## 2013-04-30 DIAGNOSIS — D72829 Elevated white blood cell count, unspecified: Secondary | ICD-10-CM | POA: Diagnosis present

## 2013-04-30 DIAGNOSIS — R269 Unspecified abnormalities of gait and mobility: Secondary | ICD-10-CM | POA: Diagnosis present

## 2013-04-30 DIAGNOSIS — S2239XA Fracture of one rib, unspecified side, initial encounter for closed fracture: Secondary | ICD-10-CM

## 2013-04-30 DIAGNOSIS — I498 Other specified cardiac arrhythmias: Secondary | ICD-10-CM | POA: Diagnosis present

## 2013-04-30 DIAGNOSIS — R1311 Dysphagia, oral phase: Secondary | ICD-10-CM | POA: Diagnosis present

## 2013-04-30 DIAGNOSIS — R531 Weakness: Secondary | ICD-10-CM

## 2013-04-30 DIAGNOSIS — IMO0002 Reserved for concepts with insufficient information to code with codable children: Secondary | ICD-10-CM | POA: Diagnosis present

## 2013-04-30 DIAGNOSIS — W19XXXA Unspecified fall, initial encounter: Secondary | ICD-10-CM | POA: Diagnosis present

## 2013-04-30 DIAGNOSIS — R6520 Severe sepsis without septic shock: Secondary | ICD-10-CM

## 2013-04-30 DIAGNOSIS — Z87820 Personal history of traumatic brain injury: Secondary | ICD-10-CM

## 2013-04-30 LAB — BLOOD GAS, ARTERIAL
Acid-base deficit: 2.6 mmol/L — ABNORMAL HIGH (ref 0.0–2.0)
BICARBONATE: 22 meq/L (ref 20.0–24.0)
Drawn by: 308601
FIO2: 0.21 %
O2 SAT: 93.7 %
PH ART: 7.363 (ref 7.350–7.450)
PO2 ART: 69.8 mmHg — AB (ref 80.0–100.0)
Patient temperature: 98.6
TCO2: 19.1 mmol/L (ref 0–100)
pCO2 arterial: 39.7 mmHg (ref 35.0–45.0)

## 2013-04-30 LAB — CBC WITH DIFFERENTIAL/PLATELET
BASOS ABS: 0.1 10*3/uL (ref 0.0–0.1)
BASOS PCT: 1 % (ref 0–1)
Eosinophils Absolute: 0.2 10*3/uL (ref 0.0–0.7)
Eosinophils Relative: 2 % (ref 0–5)
HEMATOCRIT: 48.1 % (ref 39.0–52.0)
HEMOGLOBIN: 16 g/dL (ref 13.0–17.0)
LYMPHS PCT: 17 % (ref 12–46)
Lymphs Abs: 2.1 10*3/uL (ref 0.7–4.0)
MCH: 32.6 pg (ref 26.0–34.0)
MCHC: 33.3 g/dL (ref 30.0–36.0)
MCV: 98 fL (ref 78.0–100.0)
MONO ABS: 0.9 10*3/uL (ref 0.1–1.0)
MONOS PCT: 8 % (ref 3–12)
NEUTROS ABS: 9.1 10*3/uL — AB (ref 1.7–7.7)
NEUTROS PCT: 73 % (ref 43–77)
Platelets: 347 10*3/uL (ref 150–400)
RBC: 4.91 MIL/uL (ref 4.22–5.81)
RDW: 13.6 % (ref 11.5–15.5)
WBC: 12.4 10*3/uL — AB (ref 4.0–10.5)

## 2013-04-30 LAB — BASIC METABOLIC PANEL
BUN: 24 mg/dL — AB (ref 6–23)
CHLORIDE: 106 meq/L (ref 96–112)
CO2: 23 meq/L (ref 19–32)
Calcium: 9.3 mg/dL (ref 8.4–10.5)
Creatinine, Ser: 0.9 mg/dL (ref 0.50–1.35)
GFR calc non Af Amer: 87 mL/min — ABNORMAL LOW (ref >90)
Glucose, Bld: 91 mg/dL (ref 70–99)
POTASSIUM: 4.4 meq/L (ref 3.7–5.3)
Sodium: 144 mEq/L (ref 137–147)

## 2013-04-30 LAB — URINALYSIS, ROUTINE W REFLEX MICROSCOPIC
Glucose, UA: NEGATIVE mg/dL
HGB URINE DIPSTICK: NEGATIVE
KETONES UR: 40 mg/dL — AB
Leukocytes, UA: NEGATIVE
NITRITE: NEGATIVE
PROTEIN: NEGATIVE mg/dL
SPECIFIC GRAVITY, URINE: 1.031 — AB (ref 1.005–1.030)
UROBILINOGEN UA: 1 mg/dL (ref 0.0–1.0)
pH: 5.5 (ref 5.0–8.0)

## 2013-04-30 LAB — PROTIME-INR
INR: 1.11 (ref 0.00–1.49)
PROTHROMBIN TIME: 14.1 s (ref 11.6–15.2)

## 2013-04-30 MED ORDER — MORPHINE SULFATE 4 MG/ML IJ SOLN
4.0000 mg | INTRAMUSCULAR | Status: DC | PRN
Start: 2013-04-30 — End: 2013-04-30
  Administered 2013-04-30 (×2): 4 mg via INTRAVENOUS
  Filled 2013-04-30 (×2): qty 1

## 2013-04-30 MED ORDER — HYDROCORTISONE 1 % EX CREA
TOPICAL_CREAM | Freq: Two times a day (BID) | CUTANEOUS | Status: DC
  Administered 2013-05-01 (×2): via TOPICAL
  Administered 2013-05-02 (×2): 1 via TOPICAL
  Administered 2013-05-03 – 2013-05-05 (×4): via TOPICAL
  Filled 2013-04-30: qty 28

## 2013-04-30 MED ORDER — TRAMADOL HCL 50 MG PO TABS: 50.0000 mg | ORAL_TABLET | Freq: Four times a day (QID) | ORAL | Status: DC | PRN

## 2013-04-30 MED ORDER — SENNOSIDES-DOCUSATE SODIUM 8.6-50 MG PO TABS
1.0000 | ORAL_TABLET | Freq: Every day | ORAL | Status: DC
  Filled 2013-04-30: qty 1

## 2013-04-30 MED ORDER — ONDANSETRON HCL 4 MG/2ML IJ SOLN
4.0000 mg | Freq: Once | INTRAMUSCULAR | Status: AC
  Administered 2013-04-30: 4 mg via INTRAVENOUS
  Filled 2013-04-30: qty 2

## 2013-04-30 MED ORDER — LORATADINE 10 MG PO TABS
10.0000 mg | ORAL_TABLET | Freq: Every day | ORAL | Status: DC
  Filled 2013-04-30: qty 1

## 2013-04-30 MED ORDER — BUDESONIDE-FORMOTEROL FUMARATE 80-4.5 MCG/ACT IN AERO
2.0000 | INHALATION_SPRAY | Freq: Two times a day (BID) | RESPIRATORY_TRACT | Status: DC
  Administered 2013-05-01 – 2013-05-05 (×6): 2 via RESPIRATORY_TRACT
  Filled 2013-04-30: qty 6.9

## 2013-04-30 MED ORDER — METOPROLOL TARTRATE 25 MG PO TABS
25.0000 mg | ORAL_TABLET | Freq: Every morning | ORAL | Status: DC
Start: 2013-05-01 — End: 2013-05-01
  Filled 2013-04-30: qty 1

## 2013-04-30 MED ORDER — GUAIFENESIN ER 600 MG PO TB12
600.0000 mg | ORAL_TABLET | Freq: Two times a day (BID) | ORAL | Status: DC
  Administered 2013-05-01: 600 mg via ORAL
  Filled 2013-04-30 (×3): qty 1

## 2013-04-30 MED ORDER — DULOXETINE HCL 30 MG PO CPEP
30.0000 mg | ORAL_CAPSULE | Freq: Every day | ORAL | Status: DC
  Filled 2013-04-30: qty 1

## 2013-04-30 MED ORDER — ALBUTEROL SULFATE HFA 108 (90 BASE) MCG/ACT IN AERS: 2.0000 | INHALATION_SPRAY | Freq: Four times a day (QID) | RESPIRATORY_TRACT | Status: DC | PRN

## 2013-04-30 MED ORDER — CLOPIDOGREL BISULFATE 75 MG PO TABS
75.0000 mg | ORAL_TABLET | Freq: Every day | ORAL | Status: DC
Start: 2013-05-01 — End: 2013-05-01
  Administered 2013-05-01: 75 mg via ORAL
  Filled 2013-04-30 (×2): qty 1

## 2013-04-30 MED ORDER — POLYETHYLENE GLYCOL 3350 17 G PO PACK: 17.0000 g | PACK | Freq: Every day | ORAL | Status: DC

## 2013-04-30 MED ORDER — PANTOPRAZOLE SODIUM 40 MG PO TBEC: 80.0000 mg | DELAYED_RELEASE_TABLET | Freq: Every day | ORAL | Status: DC

## 2013-04-30 MED ORDER — FLUTICASONE PROPIONATE 50 MCG/ACT NA SUSP
2.0000 | Freq: Every day | NASAL | Status: DC
Start: 2013-05-01 — End: 2013-05-05
  Administered 2013-05-01 – 2013-05-05 (×4): 2 via NASAL
  Filled 2013-04-30: qty 16

## 2013-04-30 NOTE — Progress Notes (Signed)
Rt gave pt incentive spirometer pt knows how to do. Pt can only do 250-300. Rt encourge pt to do 10 times Q1WA.
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
## 2013-04-30 NOTE — ED Notes (Signed)
Patient and family made aware that ua sample is needed. Unable to provide at this time.

## 2013-04-30 NOTE — ED Notes (Signed)
Bed: WA20 Expected date: 04/30/13 Expected time: 4:26 PM Means of arrival: Ambulance Comments: Weakness, rhonchi

## 2013-04-30 NOTE — ED Notes (Signed)
Spoke to [place] at [facility], states she will call back for information.

## 2013-04-30 NOTE — ED Notes (Signed)
Pt aware of the need for a urine sample however unable to void at this time.Urinal at bedside.

## 2013-04-30 NOTE — ED Notes (Signed)
Pharm tech at bedside to review meds

## 2013-04-30 NOTE — ED Notes (Signed)
Jessica, RT notified of pending ABG.

## 2013-04-30 NOTE — ED Notes (Signed)
Patient and wife reminded we need a urine sample as soon as he is able to provide it.
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
## 2013-04-30 NOTE — ED Notes (Signed)
Patient wife is going home.   Todd [name] [id number] HOME [phone] CELL

## 2013-04-30 NOTE — ED Notes (Signed)
MD at bedside. 

## 2013-04-30 NOTE — ED Notes (Signed)
RT at bedside.

## 2013-04-30 NOTE — ED Notes (Addendum)
Pt from home via [facility] c/o of cough and congestion. This has been on going for 23 years however gets worse and gets better. Today he is also having left rib pain left lower abdominal pain from a fall that happen 2 weeks ago. Wife is at bedside she cares for pt at home. She has been giving him Ventolin neb treatments without relief. Pain meds at home not effective for rib pain at this time.

## 2013-05-01 ENCOUNTER — Inpatient Hospital Stay (HOSPITAL_COMMUNITY): Payer: Medicare Other

## 2013-05-01 ENCOUNTER — Other Ambulatory Visit (HOSPITAL_COMMUNITY): Payer: Medicare Other

## 2013-05-01 DIAGNOSIS — A419 Sepsis, unspecified organism: Principal | ICD-10-CM | POA: Diagnosis present

## 2013-05-01 DIAGNOSIS — R0902 Hypoxemia: Secondary | ICD-10-CM

## 2013-05-01 DIAGNOSIS — J9601 Acute respiratory failure with hypoxia: Secondary | ICD-10-CM | POA: Diagnosis present

## 2013-05-01 DIAGNOSIS — S2239XA Fracture of one rib, unspecified side, initial encounter for closed fracture: Secondary | ICD-10-CM

## 2013-05-01 DIAGNOSIS — R269 Unspecified abnormalities of gait and mobility: Secondary | ICD-10-CM | POA: Diagnosis present

## 2013-05-01 LAB — D-DIMER, QUANTITATIVE (NOT AT ARMC): D-Dimer, Quant: 1.81 ug/mL-FEU — ABNORMAL HIGH (ref 0.00–0.48)

## 2013-05-01 LAB — INFLUENZA PANEL BY PCR (TYPE A & B)
H1N1FLUPCR: NOT DETECTED
INFLBPCR: NEGATIVE
Influenza A By PCR: NEGATIVE

## 2013-05-01 LAB — BASIC METABOLIC PANEL
BUN: 22 mg/dL (ref 6–23)
CHLORIDE: 105 meq/L (ref 96–112)
CO2: 25 meq/L (ref 19–32)
Calcium: 9.3 mg/dL (ref 8.4–10.5)
Creatinine, Ser: 0.94 mg/dL (ref 0.50–1.35)
GFR calc Af Amer: >90 mL/min (ref >90)
GFR, EST NON AFRICAN AMERICAN: 86 mL/min — AB (ref >90)
GLUCOSE: 100 mg/dL — AB (ref 70–99)
POTASSIUM: 4.3 meq/L (ref 3.7–5.3)
SODIUM: 145 meq/L (ref 137–147)

## 2013-05-01 LAB — CBC
HEMATOCRIT: 49.9 % (ref 39.0–52.0)
HEMOGLOBIN: 16.4 g/dL (ref 13.0–17.0)
MCH: 33 pg (ref 26.0–34.0)
MCHC: 32.9 g/dL (ref 30.0–36.0)
MCV: 100.4 fL — AB (ref 78.0–100.0)
Platelets: 341 10*3/uL (ref 150–400)
RBC: 4.97 MIL/uL (ref 4.22–5.81)
RDW: 13.9 % (ref 11.5–15.5)
WBC: 13.9 10*3/uL — ABNORMAL HIGH (ref 4.0–10.5)

## 2013-05-01 LAB — MRSA PCR SCREENING: MRSA by PCR: NEGATIVE

## 2013-05-01 LAB — TROPONIN I: Troponin I: <0.3 ng/mL (ref <0.30)

## 2013-05-01 MED ORDER — MORPHINE SULFATE 2 MG/ML IJ SOLN
1.0000 mg | Freq: Four times a day (QID) | INTRAMUSCULAR | Status: DC | PRN
  Administered 2013-05-01 – 2013-05-02 (×3): 1 mg via INTRAVENOUS
  Filled 2013-05-01 (×3): qty 1

## 2013-05-01 MED ORDER — HEPARIN SODIUM (PORCINE) 5000 UNIT/ML IJ SOLN
5000.0000 [IU] | Freq: Three times a day (TID) | INTRAMUSCULAR | Status: DC
  Administered 2013-05-01 – 2013-05-05 (×14): 5000 [IU] via SUBCUTANEOUS
  Filled 2013-05-01 (×16): qty 1

## 2013-05-01 MED ORDER — LEVALBUTEROL HCL 0.63 MG/3ML IN NEBU
0.6300 mg | INHALATION_SOLUTION | Freq: Four times a day (QID) | RESPIRATORY_TRACT | Status: DC
  Administered 2013-05-01 (×3): 0.63 mg via RESPIRATORY_TRACT
  Filled 2013-05-01 (×8): qty 3

## 2013-05-01 MED ORDER — DEXTROSE-NACL 5-0.9 % IV SOLN
INTRAVENOUS | Status: AC
  Administered 2013-05-01 – 2013-05-02 (×2): via INTRAVENOUS

## 2013-05-01 MED ORDER — PANTOPRAZOLE SODIUM 40 MG IV SOLR
40.0000 mg | INTRAVENOUS | Status: DC
  Administered 2013-05-01 – 2013-05-03 (×3): 40 mg via INTRAVENOUS
  Filled 2013-05-01 (×3): qty 40

## 2013-05-01 MED ORDER — ONDANSETRON HCL 4 MG/2ML IJ SOLN: 4.0000 mg | Freq: Four times a day (QID) | INTRAMUSCULAR | Status: DC | PRN

## 2013-05-01 MED ORDER — ONDANSETRON HCL 4 MG PO TABS: 4.0000 mg | ORAL_TABLET | Freq: Four times a day (QID) | ORAL | Status: DC | PRN

## 2013-05-01 MED ORDER — SODIUM CHLORIDE 0.9 % IJ SOLN: 3.0000 mL | INTRAMUSCULAR | Status: DC | PRN

## 2013-05-01 MED ORDER — MORPHINE SULFATE 2 MG/ML IJ SOLN: 1.0000 mg | Freq: Four times a day (QID) | INTRAMUSCULAR | Status: DC | PRN

## 2013-05-01 MED ORDER — SODIUM CHLORIDE 0.9 % IJ SOLN
3.0000 mL | Freq: Two times a day (BID) | INTRAMUSCULAR | Status: DC
  Administered 2013-05-01: 3 mL via INTRAVENOUS

## 2013-05-01 MED ORDER — IPRATROPIUM BROMIDE 0.02 % IN SOLN
0.5000 mg | Freq: Four times a day (QID) | RESPIRATORY_TRACT | Status: DC
  Administered 2013-05-01 (×3): 0.5 mg via RESPIRATORY_TRACT
  Filled 2013-05-01 (×4): qty 2.5

## 2013-05-01 MED ORDER — SODIUM CHLORIDE 0.9 % IJ SOLN: 3.0000 mL | Freq: Two times a day (BID) | INTRAMUSCULAR | Status: DC

## 2013-05-01 MED ORDER — POLYETHYLENE GLYCOL 3350 17 G PO PACK
17.0000 g | PACK | Freq: Every day | ORAL | Status: DC
  Filled 2013-05-01: qty 1

## 2013-05-01 MED ORDER — OXYCODONE HCL 5 MG PO TABS
5.0000 mg | ORAL_TABLET | ORAL | Status: DC | PRN
  Administered 2013-05-01: 5 mg via ORAL
  Filled 2013-05-01: qty 1

## 2013-05-01 MED ORDER — IOHEXOL 350 MG/ML SOLN
80.0000 mL | Freq: Once | INTRAVENOUS | Status: AC | PRN
  Administered 2013-05-01: 80 mL via INTRAVENOUS

## 2013-05-01 MED ORDER — VANCOMYCIN HCL 10 G IV SOLR
1500.0000 mg | INTRAVENOUS | Status: AC
  Administered 2013-05-01: 1500 mg via INTRAVENOUS
  Filled 2013-05-01: qty 1500

## 2013-05-01 MED ORDER — SODIUM CHLORIDE 0.9 % IV SOLN: 250.0000 mL | INTRAVENOUS | Status: DC | PRN

## 2013-05-01 MED ORDER — VANCOMYCIN HCL 10 G IV SOLR
1500.0000 mg | Freq: Two times a day (BID) | INTRAVENOUS | Status: DC
  Administered 2013-05-01 – 2013-05-03 (×4): 1500 mg via INTRAVENOUS
  Filled 2013-05-01 (×6): qty 1500

## 2013-05-01 MED ORDER — METOPROLOL TARTRATE 1 MG/ML IV SOLN
10.0000 mg | Freq: Every morning | INTRAVENOUS | Status: DC
  Administered 2013-05-01 – 2013-05-03 (×2): 10 mg via INTRAVENOUS
  Filled 2013-05-01 (×3): qty 10

## 2013-05-01 MED ORDER — PIPERACILLIN-TAZOBACTAM 3.375 G IVPB 30 MIN
3.3750 g | INTRAVENOUS | Status: AC
  Administered 2013-05-01: 3.375 g via INTRAVENOUS
  Filled 2013-05-01: qty 50

## 2013-05-01 MED ORDER — PANTOPRAZOLE SODIUM 40 MG PO TBEC: 40.0000 mg | DELAYED_RELEASE_TABLET | Freq: Every day | ORAL | Status: DC

## 2013-05-01 MED ORDER — PIPERACILLIN-TAZOBACTAM 3.375 G IVPB
3.3750 g | Freq: Three times a day (TID) | INTRAVENOUS | Status: DC
  Administered 2013-05-02 – 2013-05-03 (×5): 3.375 g via INTRAVENOUS
  Filled 2013-05-01 (×7): qty 50

## 2013-05-01 NOTE — Progress Notes (Signed)
Utilization review completed.  

## 2013-05-01 NOTE — Progress Notes (Signed)
Patient was NTS times 3 with a moderate amount of white, thick secretions obtained. Wife was at bedside throughout procedure. Vitals stable throughout as well. Myrtie Neither, RRT, RCP
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
## 2013-05-01 NOTE — Progress Notes (Signed)
Pt transferred to 5W15, report called to [place], Therapist, sports. Pt belongings sent with pt to new room. Wife/[name] notified of new room assignment, [employer] also notified. Pt stable at time of transfer, no complaints of pain or discomfort
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
## 2013-05-01 NOTE — Consult Note (Signed)
WOC wound consult note Reason for Consult: Consult requested for left posterior shoulder wound.  Pt had an area draining pus in the ER and received an I&D according to the progress notes. Wound type: Full thickness Measurement:.2X.2X2cm Wound GYI:RSWNIOEVO to visualize R/T narrow opening; appears to be beefy red. Drainage (amount, consistency, odor) Mod amt red-tinged drainage. Periwound: No further erythremia or fluctuance surrounding the wound which was previously noted. Dressing procedure/placement/frequency: Pack wound Q day with Iodoform packing strip to absorb drainage.  Cover with foam dressing to protect site. Please re-consult if further assistance is needed.  Thank-you,  Julien Girt MSN, [place], [place], [place], [place]

## 2013-05-02 DIAGNOSIS — J690 Pneumonitis due to inhalation of food and vomit: Secondary | ICD-10-CM

## 2013-05-02 DIAGNOSIS — J9600 Acute respiratory failure, unspecified whether with hypoxia or hypercapnia: Secondary | ICD-10-CM

## 2013-05-02 LAB — URINE CULTURE

## 2013-05-02 LAB — CBC WITH DIFFERENTIAL/PLATELET
BASOS PCT: 0 % (ref 0–1)
Basophils Absolute: 0.1 10*3/uL (ref 0.0–0.1)
EOS PCT: 1 % (ref 0–5)
Eosinophils Absolute: 0.2 10*3/uL (ref 0.0–0.7)
HCT: 47.5 % (ref 39.0–52.0)
Hemoglobin: 15.9 g/dL (ref 13.0–17.0)
LYMPHS ABS: 1.7 10*3/uL (ref 0.7–4.0)
Lymphocytes Relative: 13 % (ref 12–46)
MCH: 33.5 pg (ref 26.0–34.0)
MCHC: 33.5 g/dL (ref 30.0–36.0)
MCV: 100 fL (ref 78.0–100.0)
Monocytes Absolute: 0.9 10*3/uL (ref 0.1–1.0)
Monocytes Relative: 7 % (ref 3–12)
Neutro Abs: 10.5 10*3/uL — ABNORMAL HIGH (ref 1.7–7.7)
Neutrophils Relative %: 78 % — ABNORMAL HIGH (ref 43–77)
Platelets: 296 10*3/uL (ref 150–400)
RBC: 4.75 MIL/uL (ref 4.22–5.81)
RDW: 13.8 % (ref 11.5–15.5)
WBC: 13.3 10*3/uL — ABNORMAL HIGH (ref 4.0–10.5)

## 2013-05-02 MED ORDER — DEXTROSE-NACL 5-0.9 % IV SOLN: INTRAVENOUS | Status: AC

## 2013-05-02 MED ORDER — LEVALBUTEROL HCL 0.63 MG/3ML IN NEBU
0.6300 mg | INHALATION_SOLUTION | Freq: Three times a day (TID) | RESPIRATORY_TRACT | Status: DC
  Administered 2013-05-02 – 2013-05-05 (×8): 0.63 mg via RESPIRATORY_TRACT
  Filled 2013-05-02 (×20): qty 3

## 2013-05-02 MED ORDER — LIDOCAINE VISCOUS 2 % MT SOLN
15.0000 mL | OROMUCOSAL | Status: DC | PRN
  Administered 2013-05-02: 15 mL via OROMUCOSAL
  Filled 2013-05-02 (×2): qty 15

## 2013-05-02 MED ORDER — SODIUM CHLORIDE 0.9 % IV BOLUS (SEPSIS)
500.0000 mL | Freq: Once | INTRAVENOUS | Status: AC
  Administered 2013-05-02: 500 mL via INTRAVENOUS

## 2013-05-02 MED ORDER — IPRATROPIUM BROMIDE 0.02 % IN SOLN
0.5000 mg | Freq: Three times a day (TID) | RESPIRATORY_TRACT | Status: DC
  Administered 2013-05-02 – 2013-05-05 (×8): 0.5 mg via RESPIRATORY_TRACT
  Filled 2013-05-02 (×9): qty 2.5

## 2013-05-02 NOTE — Care Management Note (Signed)
    Page 1 of 2   05/05/2013     3:46:29 PM   CARE MANAGEMENT NOTE 05/05/2013  Patient:  TAKASHI, KOROL   Account Number:  [id number]  Date Initiated:  05/01/2013  Documentation initiated by:  Marvetta Gibbons  Subjective/Objective Assessment:   Pt admitted with Rib fractures    Acute respiratory failure with hypoxia  SIRS     Action/Plan:   PTA pt lived at home with spouse- was [facility] at [facility] [place] consulted for potential need for placement again.  pt eval-rec hhpt   Anticipated DC Date:  05/05/2013   Anticipated DC Plan:  [place]  In-house referral  Clinical Social Worker      DC Planning Services  CM consult      [facility] Choice  HOME HEALTH   Choice offered to / List presented to:  C-3 Spouse        HH arranged  HH-2 PT  HH-1 RN  [place].   Status of service:  Completed, signed off Medicare Important Message given?   (If response is "NO", the following Medicare IM given date fields will be blank) Date Medicare IM given:   Date Additional Medicare IM given:    Discharge Disposition:  [place]  Per UR Regulation:  Reviewed for med. necessity/level of care/duration of stay  If discussed at [place] of Stay Meetings, dates discussed:    Comments:  05/05/13 15:45 Tomi Bamberger RN, BSN [phone] patient dc to home , [facility] notified.  05/04/13 14:33 Tomi Bamberger RN, BSN [phone] patient is for possible dc tomorrow, [facility] notified.  NCM will continue to follow for dc needs.  05/03/13 14:15 Tomi Bamberger RN, BSN [phone] added [facility] and aide to [facility] services.  [facility] liason stated she tried to call spouse, but spouse could not talk because she was not feeling good, and so Butch Penny with [facility]  will try to talk to her tomorrow about the [facility] services.  05/02/13 16:36 Tomi Bamberger RN, BSN [phone] patient lives with spouse in [place].  NCM spoke with wife [name]  [phone], she states they have only 4 pt visits left , and patient only has medicare part A, she would like to work with [name] , referral made to [facility] for [place] notified.  Soc will begin 24-48 hrs post discharge.
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
## 2013-05-02 NOTE — Progress Notes (Signed)
  Date: 05/02/2013  Patient name: Todd [name]  Medical record number: [id number]  Date of birth: [birth year]   This patient has been seen and the plan of care was discussed with the house staff. Please see their note for complete details. I concur with their findings with the following additions/corrections: He is clinically dry this morning, given 500 cc NS IV bolus and continue D5NS at 75 cc/hr IV. I asked him to cough for me and his cough was poor. He has rhonchi noted on exam. Continue NPO with aspiration precautions. Needs SLP to see him. Continue treatment for aspiration pneumonia w/ sepsis. He has hypoxic respiratory failure due to aspiration pneumonia at this time. Need to discuss with his wife. He may need consideration of PEG tube placement.   Dominic Pea, DO, [facility] 05/02/2013, 11:37 AM
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
## 2013-05-03 ENCOUNTER — Inpatient Hospital Stay (HOSPITAL_COMMUNITY): Payer: Medicare Other

## 2013-05-03 DIAGNOSIS — J690 Pneumonitis due to inhalation of food and vomit: Secondary | ICD-10-CM | POA: Diagnosis present

## 2013-05-03 LAB — CBC
HEMATOCRIT: 41.7 % (ref 39.0–52.0)
HEMOGLOBIN: 13.9 g/dL (ref 13.0–17.0)
MCH: 32.7 pg (ref 26.0–34.0)
MCHC: 33.3 g/dL (ref 30.0–36.0)
MCV: 98.1 fL (ref 78.0–100.0)
Platelets: 273 10*3/uL (ref 150–400)
RBC: 4.25 MIL/uL (ref 4.22–5.81)
RDW: 13.7 % (ref 11.5–15.5)
WBC: 11.1 10*3/uL — AB (ref 4.0–10.5)

## 2013-05-03 MED ORDER — DULOXETINE HCL 60 MG PO CPEP
60.0000 mg | ORAL_CAPSULE | Freq: Every day | ORAL | Status: DC
  Administered 2013-05-03 – 2013-05-05 (×3): 60 mg via ORAL
  Filled 2013-05-03 (×3): qty 1

## 2013-05-03 MED ORDER — PANTOPRAZOLE SODIUM 40 MG PO TBEC
40.0000 mg | DELAYED_RELEASE_TABLET | Freq: Every day | ORAL | Status: DC
  Administered 2013-05-03 – 2013-05-05 (×3): 40 mg via ORAL
  Filled 2013-05-03 (×3): qty 1

## 2013-05-03 MED ORDER — GUAIFENESIN 100 MG/5ML PO SOLN
200.0000 mg | ORAL | Status: DC | PRN
  Filled 2013-05-03: qty 10

## 2013-05-03 MED ORDER — AMOXICILLIN-POT CLAVULANATE 875-125 MG PO TABS
1.0000 | ORAL_TABLET | Freq: Two times a day (BID) | ORAL | Status: DC
  Administered 2013-05-03 – 2013-05-05 (×5): 1 via ORAL
  Filled 2013-05-03 (×7): qty 1

## 2013-05-03 MED ORDER — LORATADINE 10 MG PO TABS
10.0000 mg | ORAL_TABLET | Freq: Every day | ORAL | Status: DC
  Administered 2013-05-03 – 2013-05-05 (×3): 10 mg via ORAL
  Filled 2013-05-03 (×3): qty 1

## 2013-05-03 MED ORDER — TRAMADOL HCL 50 MG PO TABS: 50.0000 mg | ORAL_TABLET | Freq: Four times a day (QID) | ORAL | Status: DC | PRN

## 2013-05-03 MED ORDER — METOPROLOL TARTRATE 25 MG PO TABS
25.0000 mg | ORAL_TABLET | Freq: Every day | ORAL | Status: DC
  Administered 2013-05-03 – 2013-05-05 (×3): 25 mg via ORAL
  Filled 2013-05-03 (×3): qty 1

## 2013-05-03 MED ORDER — CLOPIDOGREL BISULFATE 75 MG PO TABS
75.0000 mg | ORAL_TABLET | Freq: Every day | ORAL | Status: DC
  Administered 2013-05-04 – 2013-05-05 (×2): 75 mg via ORAL
  Filled 2013-05-03 (×3): qty 1

## 2013-05-03 NOTE — Progress Notes (Signed)
  Date: 05/03/2013  Patient name: Todd [name]  Medical record number: [id number]  Date of birth: [birth year]   This patient has been seen and the plan of care was discussed with the house staff. Please see their note for complete details. I concur with their findings with the following additions/corrections: Appreciate SLP recs, Will need Dysphagia 1 diet with close observation. WBC is decreasing. BC NGTD. Looks clinically better today. Afebrile. Need to discuss with his wife SNF placement if she agrees. Would change to PO Augmentin given ok from SLP to feed and treat for a total of 2 weeks.  Dominic Pea, DO, [facility] 05/03/2013, 1:36 PM
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
## 2013-05-04 DIAGNOSIS — IMO0002 Reserved for concepts with insufficient information to code with codable children: Secondary | ICD-10-CM

## 2013-05-04 DIAGNOSIS — E870 Hyperosmolality and hypernatremia: Secondary | ICD-10-CM

## 2013-05-04 LAB — CBC
HCT: 40.7 % (ref 39.0–52.0)
Hemoglobin: 13.4 g/dL (ref 13.0–17.0)
MCH: 31.9 pg (ref 26.0–34.0)
MCHC: 32.9 g/dL (ref 30.0–36.0)
MCV: 96.9 fL (ref 78.0–100.0)
PLATELETS: 258 10*3/uL (ref 150–400)
RBC: 4.2 MIL/uL — ABNORMAL LOW (ref 4.22–5.81)
RDW: 13.7 % (ref 11.5–15.5)
WBC: 7.6 10*3/uL (ref 4.0–10.5)

## 2013-05-04 LAB — BASIC METABOLIC PANEL
BUN: 11 mg/dL (ref 6–23)
CALCIUM: 9 mg/dL (ref 8.4–10.5)
CHLORIDE: 114 meq/L — AB (ref 96–112)
CO2: 20 meq/L (ref 19–32)
CREATININE: 0.76 mg/dL (ref 0.50–1.35)
GFR calc Af Amer: >90 mL/min (ref >90)
GFR calc non Af Amer: >90 mL/min (ref >90)
GLUCOSE: 91 mg/dL (ref 70–99)
Potassium: 3.4 mEq/L — ABNORMAL LOW (ref 3.7–5.3)
Sodium: 150 mEq/L — ABNORMAL HIGH (ref 137–147)

## 2013-05-04 MED ORDER — DEXTROSE 5 % IV SOLN: INTRAVENOUS | Status: DC

## 2013-05-04 MED ORDER — RESOURCE THICKENUP CLEAR PO POWD
ORAL | Status: DC | PRN
  Filled 2013-05-04: qty 125

## 2013-05-04 MED ORDER — DEXTROSE 5 % IV SOLN
INTRAVENOUS | Status: AC
  Administered 2013-05-04: 23:00:00 via INTRAVENOUS

## 2013-05-04 MED ORDER — DEXTROSE 5 % IV SOLN
INTRAVENOUS | Status: AC
  Administered 2013-05-04: 09:00:00 via INTRAVENOUS
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
## 2013-05-04 NOTE — Progress Notes (Signed)
  Date: 05/04/2013  Patient name: Todd [name]  Medical record number: [id number]  Date of birth: [birth year]   This patient has been seen and the plan of care was discussed with the house staff. Please see their note for complete details. I concur with their findings with the following additions/corrections: Sitting up and eating breakfast this morning. He did not take in much PO yesterday. He is noted to have evidence of dehydration with a Na of 150. I agree with encouraging PO fluids with aspiration precautions and IV D5W to replace free water deficit.  Continue Augmentin PO x 14 days total therapy for aspiration pneumonia.  Dominic Pea, DO, [facility] 05/04/2013, 1:52 PM

## 2013-05-05 DIAGNOSIS — R5381 Other malaise: Secondary | ICD-10-CM

## 2013-05-05 DIAGNOSIS — R5383 Other fatigue: Secondary | ICD-10-CM

## 2013-05-05 LAB — BASIC METABOLIC PANEL
BUN: 10 mg/dL (ref 6–23)
CALCIUM: 8.9 mg/dL (ref 8.4–10.5)
CO2: 24 mEq/L (ref 19–32)
CREATININE: 0.7 mg/dL (ref 0.50–1.35)
Chloride: 109 mEq/L (ref 96–112)
GFR calc non Af Amer: >90 mL/min (ref >90)
Glucose, Bld: 106 mg/dL — ABNORMAL HIGH (ref 70–99)
Potassium: 2.9 mEq/L — CL (ref 3.7–5.3)
Sodium: 144 mEq/L (ref 137–147)

## 2013-05-05 LAB — MAGNESIUM: Magnesium: 2 mg/dL (ref 1.5–2.5)

## 2013-05-05 MED ORDER — AMOXICILLIN-POT CLAVULANATE 875-125 MG PO TABS: 1.0000 | ORAL_TABLET | Freq: Two times a day (BID) | ORAL | Status: DC

## 2013-05-05 MED ORDER — POTASSIUM CHLORIDE CRYS ER 20 MEQ PO TBCR
40.0000 meq | EXTENDED_RELEASE_TABLET | Freq: Once | ORAL | Status: AC
  Administered 2013-05-05: 40 meq via ORAL
  Filled 2013-05-05: qty 2

## 2013-05-05 NOTE — Discharge Summary (Signed)
  Date: 05/05/2013  Patient name: Todd [name]  Medical record number: [id number]  Date of birth: Aug 26, 1945   This patient has been seen and the plan of care was discussed with the house staff. Please see their note for complete details. I concur with their findings and plan.  Dominic Pea, DO, [facility] 05/05/2013, 3:20 PM

## 2013-05-05 NOTE — Discharge Instructions (Signed)
Please take all of your medicines as prescribed.  We are prescribing you an antibiotic called AUGMENTIN that you should take by crushing it and mixing with applesauce.    Don't forget your follow-up appointment in clinic next week.   Dysphagia Diet Level 1, Pureed A dysphagia dietis for people who have problems with chewing or swallowing. This diet limits foods to certain textures that make it easier and safer to swallow. This dysphagia pureed diet is restricted to:  Foods that are "pudding-like" and have the same textural consistency, such as smooth yogurt, custard, or mashed potatoes.  Foods that do not contain coarse textures, raw fruits or vegetables, or nuts.  Foods that do not include bread or bread-like textures.  Foods prepared so they can be swallowed with minimal or no chewing. FOOD TEXTURES Beverages  Recommended: Any smooth beverages without lumps, chunks, or pulp. Beverages will need to be thickened to appropriate consistency.  Avoid: Any beverages with lumps, chunks, seeds, or pulp.  You are currently limited to one of the following liquid consistency levels:  Thin.  Nectar-like.  Honey-like.  Spoon-thick. Breads  Avoid: Breads, rolls, crackers, biscuits, pancakes, waffles, [place] toast, and muffins. Cereals  Recommended: Smooth, cooked cereals such as grits. Cereals should have a "pudding-like" consistency.  Avoid: All dry cereals and any cooked cereals with nuts, seeds, lumps, or chunks. Oatmeal. Desserts  Recommended: Smooth puddings, custards, yogurt, pureed desserts, and souffls.  Avoid: Ices, gelatins, frozen juice bars, cookies, cakes, pies, pastry, coarse or textured puddings, bread and rice pudding, fruited yogurt.  These foods are considered thin liquids and should be avoided if thin liquids are restricted:  Frozen malts, milk shakes, frozen yogurt, eggnog, nutritional supplements, ice cream, sherbet, regular or sugar-free gelatin, or any foods  that become thin liquid at either room temperature, 70 F (21.1 C) or body temperature, 98 F (36.7 C). Fats  Recommended: Butter, margarine, strained gravy, sour cream, mayonnaise, cream cheese, whipped topping. Smooth sauces such as white sauce, cheese sauce, or hollandaise sauce.  Avoid: All fats with coarse or chunky additives. Fruits  Recommended: Pureed fruits or well-mashed fresh bananas. Fruit juices without pulp, seeds, or chunks. These foods may need to be thickened to appropriate consistency if thin liquids are restricted.  Avoid: Whole fruits (fresh, frozen, canned, dried). Meats and Meat Substitutes  Recommended: Pureed meats. Braunschweiger. Souffls that are smooth. Softened tofu mixed with moisture. Hummus or other pureed legume spread. Pureed cottage cheese.  Avoid: Whole or ground meats, fish, or poultry. Non-pureed lentils or legumes. Cheese. Peanut butter, unless pureed into foods correctly. Non-pureed fried, scrambled, or hard-cooked eggs. Potatoes and Starches  Recommended: Creamy mashed potatoes, pureed potatoes with gravy, butter, margarine, or sour cream. Well-cooked pasta, noodles, bread dressing, or rice that has been pureed in a blender to a smooth consistency.  Avoid: All other potatoes, rice, or noodles. Dry mashed potatoes, cooked grains. Non-pureed bread dressing. Soups  Recommended: Soups that have been pureed in a blender or strained. These soups may need to be thickened to appropriate consistency.  Avoid: Soups that have chunks or lumps. Vegetables  Recommended: Pureed vegetables without chunks, lumps, pulp, or seeds. Tomato paste or sauce without seeds. Tomato or vegetable juice. These juices may need to be thickened to appropriate consistency if juice is thinner than prescribed liquid consistency.  Avoid: All other vegetables that have not been pureed. Tomato sauce with seeds or thin tomato juice. Miscellaneous  Recommended: Sugar, artificial  sweetener, salt, finely ground pepper, and  spices. Ketchup, mustard, barbecue sauce, and other smooth sauces. Honey, smooth jellies. Very soft, smooth candy such as truffles.  Avoid: Coarsely ground pepper and herbs. Chunky fruit preserves and seedy jams. Seeds, nuts, or sticky foods. Chewy candies such as caramels or licorice. Document Released: 04/13/2005 Document Revised: 12/14/2012 Document Reviewed: 09/20/2012 [facility] Patient Information 2014 [employer] [place].  Aspiration Pneumonia Aspiration pneumonia is an infection in your lungs. It occurs when food, liquid, or stomach contents (vomit) are inhaled (aspirated) into your lungs. When these things get into your lungs, swelling (inflammation) and infection can occur. This can make it difficult for you to breathe. Aspiration pneumonia is a serious condition and can be life threatening. RISK FACTORS Aspiration pneumonia is more likely to occur when a person's cough (gag) reflex or ability to swallow has been decreased. Some things that can do this include:   Having a brain injury or disease, such as stroke, seizures, Parkinson disease, dementia, or amyotrophic lateral sclerosis (ALS).   Being given general anesthetic for procedures.   Being in a coma (unconscious).   Having a narrowing of the tube that carries food to the stomach (esophagus).   Drinking too much alcohol. If a person passes out and vomits, vomit can be swallowed into the lungs.   Taking certain medicines, such as tranquilizers or sedatives.  SIGNS AND SYMPTOMS   Coughing after swallowing food or liquids.   Breathing problems, such as wheezing or shortness of breath.   Bluish skin. This can be caused by lack of oxygen.   Coughing up food or mucus. The mucus might contain blood, greenish material, or yellowish-white fluid (pus).   Fever.   Chest pain.   Being more tired than usual (fatigue).   Sweating more than usual.   Bad breath.  DIAGNOSIS    A physical exam will be done. During the exam, the health care provider will listen to your lungs with a stethoscope to check for:   Crackling sounds in the lungs.  Decreased breath sounds.  A rapid heartbeat. Various tests may be ordered. These may include:   Chest X-ray.   CT scan.   Swallowing study. This test looks at how food is swallowed and whether it goes into your breathing tube (trachea) or food pipe (esophagus).   Sputum culture. Saliva and mucus (sputum) are collected from the lungs or the tubes that carry air to the lungs (bronchi). The sputum is then tested for bacteria.   Bronchoscopy. This test uses a flexible tube (bronchoscope) to see inside the lungs. TREATMENT  Treatment will usually include antibiotic medicines. Other medicines may also be used to reduce fever or pain. You may need to be treated in the hospital. In the hospital, your breathing will be carefully monitored. Depending on how well you are breathing, you may need to be given oxygen, or you may need breathing support from a breathing machine (ventilator). For people who fail a swallowing study, a feeding tube might be placed in the stomach, or they may be asked to avoid certain food textures or liquids when they eat. HOME CARE INSTRUCTIONS   Carefully follow any special eating instructions you were given, such as avoiding certain food textures or thickening liquids. This reduces the risk of developing aspiration pneumonia again.  Only take over-the-counter or prescription medicines as directed by your health care provider. Follow the directions carefully.   If you were prescribed antibiotics, take them as directed. Finish them even if you start to feel better.  Rest as instructed by your health care provider.   Keep all follow-up appointments with your health care provider.  SEEK MEDICAL CARE IF:   You develop worsening shortness of breath, wheezing, or difficulty breathing.   You  develop a fever.   You have chest pain.  MAKE SURE YOU:   Understand these instructions.  Will watch your condition.  Will get help right away if you are not doing well or get worse. Document Released: 02/08/2009 Document Revised: 12/14/2012 Document Reviewed: 09/29/2012 [facility] Patient Information 2014 [place].

## 2013-05-05 NOTE — Clinical Social Work Note (Signed)
Per MD patient ready to DC home. RN and wife notified of DC. DC packet left on chart for PTAR. Ambulance transport requested for patient. CSW signing off.  [name], [place], [place], [phone]
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
## 2013-05-05 NOTE — Progress Notes (Signed)
Nsg Discharge Note  Admit Date:  04/30/2013 Discharge date: 05/05/2013   [name] to be D/C'd Home per MD order.  AVS completed.  Copy for chart not signed dt inability of patient, and copy for patient signed and dated. D/C instructions explained to wife via telephone. Wife able to verbalize understanding.  Discharge Medication:   Medication List         albuterol 108 (90 BASE) MCG/ACT inhaler  Commonly known as:  PROAIR HFA  Inhale 2 puffs into the lungs every 6 (six) hours as needed for wheezing.     amoxicillin-clavulanate 875-125 MG per tablet  Commonly known as:  AUGMENTIN  Take 1 tablet by mouth every 12 (twelve) hours.     budesonide-formoterol 80-4.5 MCG/ACT inhaler  Commonly known as:  SYMBICORT  Inhale 2 puffs into the lungs 2 (two) times daily.     clopidogrel 75 MG tablet  Commonly known as:  PLAVIX  Take 1 tablet (75 mg total) by mouth every morning.     desloratadine 5 MG tablet  Commonly known as:  CLARINEX  Take 1 tablet (5 mg total) by mouth daily.     desonide 0.05 % cream  Commonly known as:  DESOWEN  Apply topically 2 (two) times daily. Apply to the affected area twice a day     DULoxetine 60 MG capsule  Commonly known as:  CYMBALTA  Take 30 mg by mouth daily.     fluticasone 50 MCG/ACT nasal spray  Commonly known as:  FLONASE  Place 2 sprays into the nose 2 (two) times daily.     ipratropium-albuterol 0.5-2.5 (3) MG/3ML Soln  Commonly known as:  DUONEB  Take 3 mLs by nebulization every 6 (six) hours as needed. For wheezing     loratadine 10 MG tablet  Commonly known as:  CLARITIN  Take 10 mg by mouth daily.     metoprolol tartrate 25 MG tablet  Commonly known as:  LOPRESSOR  Take 1 tablet (25 mg total) by mouth every morning.     MUCINEX 600 MG 12 hr tablet  Generic drug:  guaiFENesin  Take 600 mg by mouth 2 (two) times daily.     omeprazole 40 MG capsule  Commonly known as:  PRILOSEC  Take 1 capsule (40 mg total) by mouth  daily.     polyethylene glycol packet  Commonly known as:  MIRALAX / GLYCOLAX  Take 17 g by mouth daily.     senna-docusate 8.6-50 MG per tablet  Commonly known as:  Senokot-S  Take 1 tablet by mouth daily.     traMADol 50 MG tablet  Commonly known as:  ULTRAM  Take 1 tablet (50 mg total) by mouth every 6 (six) hours as needed for pain.        Discharge Assessment: Filed Vitals:   05/05/13 1340  BP: 116/69  Pulse: 78  Temp: 98 F (36.7 C)  Resp: 18   Skin clean, dry and intact without evidence of skin break down, no evidence of skin tears noted. IV catheter discontinued intact. Site without signs and symptoms of complications - no redness or edema noted at insertion site, patient denies c/o pain - only slight tenderness at site.  Dressing with slight pressure applied.  D/c Instructions-Education: Discharge instructions given to patient/family with verbalized understanding. D/c education completed with patient/family including follow up instructions, medication list, d/c activities limitations if indicated, with other d/c instructions as indicated by MD - patient able to verbalize understanding,  all questions fully answered. Patient instructed to return to ED, call 911, or call MD for any changes in condition.  Patient escorted via [place], and D/C home via private auto.  Sota Hetz, Elissa Hefty, RN 05/05/2013 4:21 PM
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
## 2013-05-05 NOTE — Progress Notes (Signed)
CRITICAL VALUE ALERT  Critical value received:  K 2.9  Date of notification:  05/05/13  Time of notification:  0813  Critical value read back:yes  Nurse who received alert:  Wonda Cerise RN  MD notified (1st page): 8:14 AM Ivin Poot MD  Time of first page:  8:14 AM
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
## 2013-05-05 NOTE — Progress Notes (Signed)
PT Cancellation Note  Patient Details Name: Todd [name] MRN: [id number] DOB: [birth year]   Cancelled Treatment:    Reason Eval/Treat Not Completed: Patient declined, no reason specified.  PT attempted x 10 minutes to get pt to engage in PT session and transfer to chair.  Pt physically refused, closed eyes and ignored PT.  Will attempt at another time as appropriate.   Viviene Thurston 05/05/2013, 1:15 PM
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
## 2013-05-07 LAB — CULTURE, BLOOD (ROUTINE X 2): Culture: NO GROWTH

## 2013-05-07 LAB — CULTURE, BLOOD (SINGLE): CULTURE: NO GROWTH

## 2013-05-12 ENCOUNTER — Encounter: Payer: Medicare Other | Admitting: Internal Medicine

## 2013-05-15 ENCOUNTER — Emergency Department (HOSPITAL_COMMUNITY): Payer: Medicare Other

## 2013-05-15 ENCOUNTER — Encounter (HOSPITAL_COMMUNITY): Payer: Self-pay | Admitting: Emergency Medicine

## 2013-05-15 ENCOUNTER — Inpatient Hospital Stay (HOSPITAL_COMMUNITY)
Admission: EM | Admit: 2013-05-15 | Discharge: 2013-05-16 | DRG: 179 | Disposition: A | Discharge status: Home Health | Payer: Medicare Other | Attending: Internal Medicine | Admitting: Internal Medicine

## 2013-05-15 DIAGNOSIS — Z79899 Other long term (current) drug therapy: Secondary | ICD-10-CM

## 2013-05-15 DIAGNOSIS — K219 Gastro-esophageal reflux disease without esophagitis: Secondary | ICD-10-CM | POA: Diagnosis present

## 2013-05-15 DIAGNOSIS — I2510 Atherosclerotic heart disease of native coronary artery without angina pectoris: Secondary | ICD-10-CM | POA: Diagnosis present

## 2013-05-15 DIAGNOSIS — A419 Sepsis, unspecified organism: Secondary | ICD-10-CM | POA: Diagnosis present

## 2013-05-15 DIAGNOSIS — G8929 Other chronic pain: Secondary | ICD-10-CM

## 2013-05-15 DIAGNOSIS — M549 Dorsalgia, unspecified: Secondary | ICD-10-CM

## 2013-05-15 DIAGNOSIS — T17908A Unspecified foreign body in respiratory tract, part unspecified causing other injury, initial encounter: Secondary | ICD-10-CM | POA: Diagnosis present

## 2013-05-15 DIAGNOSIS — Z87820 Personal history of traumatic brain injury: Secondary | ICD-10-CM

## 2013-05-15 DIAGNOSIS — F3289 Other specified depressive episodes: Secondary | ICD-10-CM | POA: Diagnosis present

## 2013-05-15 DIAGNOSIS — M810 Age-related osteoporosis without current pathological fracture: Secondary | ICD-10-CM | POA: Diagnosis present

## 2013-05-15 DIAGNOSIS — F329 Major depressive disorder, single episode, unspecified: Secondary | ICD-10-CM | POA: Diagnosis present

## 2013-05-15 DIAGNOSIS — J690 Pneumonitis due to inhalation of food and vomit: Principal | ICD-10-CM | POA: Diagnosis present

## 2013-05-15 DIAGNOSIS — Z8673 Personal history of transient ischemic attack (TIA), and cerebral infarction without residual deficits: Secondary | ICD-10-CM

## 2013-05-15 DIAGNOSIS — I10 Essential (primary) hypertension: Secondary | ICD-10-CM | POA: Diagnosis present

## 2013-05-15 DIAGNOSIS — Z85828 Personal history of other malignant neoplasm of skin: Secondary | ICD-10-CM

## 2013-05-15 DIAGNOSIS — E860 Dehydration: Secondary | ICD-10-CM | POA: Diagnosis present

## 2013-05-15 DIAGNOSIS — Z7902 Long term (current) use of antithrombotics/antiplatelets: Secondary | ICD-10-CM

## 2013-05-15 DIAGNOSIS — B1920 Unspecified viral hepatitis C without hepatic coma: Secondary | ICD-10-CM | POA: Diagnosis present

## 2013-05-15 LAB — INFLUENZA PANEL BY PCR (TYPE A & B)
H1N1 flu by pcr: NOT DETECTED
INFLAPCR: NEGATIVE
Influenza B By PCR: NEGATIVE

## 2013-05-15 LAB — URINALYSIS, ROUTINE W REFLEX MICROSCOPIC
BILIRUBIN URINE: NEGATIVE
Glucose, UA: NEGATIVE mg/dL
HGB URINE DIPSTICK: NEGATIVE
Ketones, ur: NEGATIVE mg/dL
Nitrite: NEGATIVE
Protein, ur: NEGATIVE mg/dL
Specific Gravity, Urine: 1.024 (ref 1.005–1.030)
Urobilinogen, UA: 1 mg/dL (ref 0.0–1.0)
pH: 6 (ref 5.0–8.0)

## 2013-05-15 LAB — CBC WITH DIFFERENTIAL/PLATELET
Basophils Absolute: 0 10*3/uL (ref 0.0–0.1)
Basophils Relative: 0 % (ref 0–1)
Eosinophils Absolute: 0 10*3/uL (ref 0.0–0.7)
Eosinophils Relative: 0 % (ref 0–5)
HCT: 43.1 % (ref 39.0–52.0)
HEMOGLOBIN: 14.7 g/dL (ref 13.0–17.0)
LYMPHS ABS: 1.9 10*3/uL (ref 0.7–4.0)
Lymphocytes Relative: 18 % (ref 12–46)
MCH: 33.7 pg (ref 26.0–34.0)
MCHC: 34.1 g/dL (ref 30.0–36.0)
MCV: 98.9 fL (ref 78.0–100.0)
Monocytes Absolute: 1.3 10*3/uL — ABNORMAL HIGH (ref 0.1–1.0)
Monocytes Relative: 12 % (ref 3–12)
NEUTROS PCT: 69 % (ref 43–77)
Neutro Abs: 7.4 10*3/uL (ref 1.7–7.7)
Platelets: 262 10*3/uL (ref 150–400)
RBC: 4.36 MIL/uL (ref 4.22–5.81)
RDW: 14.7 % (ref 11.5–15.5)
WBC: 10.6 10*3/uL — ABNORMAL HIGH (ref 4.0–10.5)

## 2013-05-15 LAB — BASIC METABOLIC PANEL
BUN: 11 mg/dL (ref 6–23)
CHLORIDE: 106 meq/L (ref 96–112)
CO2: 22 mEq/L (ref 19–32)
Calcium: 9.1 mg/dL (ref 8.4–10.5)
Creatinine, Ser: 0.79 mg/dL (ref 0.50–1.35)
GFR calc Af Amer: >90 mL/min (ref >90)
GFR calc non Af Amer: >90 mL/min (ref >90)
Glucose, Bld: 109 mg/dL — ABNORMAL HIGH (ref 70–99)
POTASSIUM: 4.2 meq/L (ref 3.7–5.3)
Sodium: 142 mEq/L (ref 137–147)

## 2013-05-15 LAB — PRO B NATRIURETIC PEPTIDE: Pro B Natriuretic peptide (BNP): 185.4 pg/mL — ABNORMAL HIGH (ref 0–125)

## 2013-05-15 LAB — CG4 I-STAT (LACTIC ACID): Lactic Acid, Venous: 1.26 mmol/L (ref 0.5–2.2)

## 2013-05-15 LAB — URINE MICROSCOPIC-ADD ON

## 2013-05-15 LAB — TROPONIN I

## 2013-05-15 MED ORDER — IPRATROPIUM-ALBUTEROL 0.5-2.5 (3) MG/3ML IN SOLN: 3.0000 mL | Freq: Three times a day (TID) | RESPIRATORY_TRACT | Status: DC

## 2013-05-15 MED ORDER — PANTOPRAZOLE SODIUM 40 MG IV SOLR
40.0000 mg | INTRAVENOUS | Status: DC
  Administered 2013-05-15: 40 mg via INTRAVENOUS
  Filled 2013-05-15 (×2): qty 40

## 2013-05-15 MED ORDER — SODIUM CHLORIDE 0.9 % IV SOLN
INTRAVENOUS | Status: DC
  Administered 2013-05-16: 06:00:00 via INTRAVENOUS

## 2013-05-15 MED ORDER — IOHEXOL 350 MG/ML SOLN
100.0000 mL | Freq: Once | INTRAVENOUS | Status: AC | PRN
  Administered 2013-05-15: 70 mL via INTRAVENOUS

## 2013-05-15 MED ORDER — VANCOMYCIN HCL 10 G IV SOLR
1750.0000 mg | Freq: Once | INTRAVENOUS | Status: AC
  Administered 2013-05-15: 1750 mg via INTRAVENOUS
  Filled 2013-05-15: qty 1750

## 2013-05-15 MED ORDER — ALBUTEROL SULFATE (2.5 MG/3ML) 0.083% IN NEBU: 2.5000 mg | INHALATION_SOLUTION | Freq: Four times a day (QID) | RESPIRATORY_TRACT | Status: DC | PRN

## 2013-05-15 MED ORDER — IPRATROPIUM-ALBUTEROL 0.5-2.5 (3) MG/3ML IN SOLN
3.0000 mL | Freq: Four times a day (QID) | RESPIRATORY_TRACT | Status: DC
  Administered 2013-05-15: 3 mL via RESPIRATORY_TRACT
  Filled 2013-05-15: qty 3

## 2013-05-15 MED ORDER — PIPERACILLIN-TAZOBACTAM 3.375 G IVPB 30 MIN
3.3750 g | Freq: Once | INTRAVENOUS | Status: AC
  Administered 2013-05-15: 3.375 g via INTRAVENOUS
  Filled 2013-05-15: qty 50

## 2013-05-15 MED ORDER — PIPERACILLIN-TAZOBACTAM 3.375 G IVPB
3.3750 g | Freq: Three times a day (TID) | INTRAVENOUS | Status: DC
  Administered 2013-05-16: 3.375 g via INTRAVENOUS
  Filled 2013-05-15 (×2): qty 50

## 2013-05-15 MED ORDER — ENOXAPARIN SODIUM 40 MG/0.4ML ~~LOC~~ SOLN
40.0000 mg | SUBCUTANEOUS | Status: DC
  Administered 2013-05-15: 40 mg via SUBCUTANEOUS
  Filled 2013-05-15 (×3): qty 0.4

## 2013-05-15 MED ORDER — SODIUM CHLORIDE 0.9 % IJ SOLN
3.0000 mL | INTRAMUSCULAR | Status: DC | PRN
Start: 2013-05-15 — End: 2013-05-16

## 2013-05-15 MED ORDER — SODIUM CHLORIDE 0.9 % IV BOLUS (SEPSIS)
1000.0000 mL | Freq: Once | INTRAVENOUS | Status: AC
  Administered 2013-05-15: 1000 mL via INTRAVENOUS

## 2013-05-15 NOTE — ED Provider Notes (Signed)
I saw and evaluated the patient, reviewed the resident's note and I agree with the findings and plan.  EKG Interpretation    Date/Time:  Monday May 15 2013 12:48:38 EST Ventricular Rate:  98 PR Interval:  193 QRS Duration: 90 QT Interval:  347 QTC Calculation: 443 R Axis:   11 Text Interpretation:  Sinus rhythm Abnormal R-wave progression, early transition Baseline wander in lead(s) III aVF V3 Confirmed by Madeleyn Schwimmer  MD, Shaquanta Harkless ([id number]) on 05/15/2013 12:57:35 PM           Patient here from home with shortness of breath. Recently admitted to the hospital for aspiration pneumonia. Coarse breath sounds noted bilaterally. Chest x-ray without signs of CHF. Will order CT chest  Leota Jacobsen, MD 05/15/13 1350

## 2013-05-15 NOTE — ED Notes (Signed)
Patient transported to CT 
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
## 2013-05-15 NOTE — ED Notes (Signed)
Lactic acid results given to Dr. Zenia Resides

## 2013-05-15 NOTE — ED Notes (Signed)
Arrived to ED incontinent of urine. NT undressed pt and found pt's penis and scrotum red. Barrier cream applied.
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
## 2013-05-15 NOTE — ED Notes (Signed)
Pt placed on 2 liters N/c O2

## 2013-05-15 NOTE — Progress Notes (Signed)
Pt arrived to unit via stretcher no c/o sob or cp. MD bedside. Will continue to monitor.

## 2013-05-15 NOTE — ED Notes (Signed)
To ED via EMS, from home, for eval of SOB. Pt recently discharged from hospital on Friday after being admitted for a fall. Pt ambulates with assistance normally. Arrived on neb tx

## 2013-05-16 DIAGNOSIS — T17908A Unspecified foreign body in respiratory tract, part unspecified causing other injury, initial encounter: Secondary | ICD-10-CM | POA: Diagnosis present

## 2013-05-16 DIAGNOSIS — K219 Gastro-esophageal reflux disease without esophagitis: Secondary | ICD-10-CM

## 2013-05-16 DIAGNOSIS — J690 Pneumonitis due to inhalation of food and vomit: Principal | ICD-10-CM

## 2013-05-16 DIAGNOSIS — I2510 Atherosclerotic heart disease of native coronary artery without angina pectoris: Secondary | ICD-10-CM

## 2013-05-16 DIAGNOSIS — E860 Dehydration: Secondary | ICD-10-CM

## 2013-05-16 DIAGNOSIS — F3289 Other specified depressive episodes: Secondary | ICD-10-CM

## 2013-05-16 DIAGNOSIS — F329 Major depressive disorder, single episode, unspecified: Secondary | ICD-10-CM

## 2013-05-16 LAB — CBC WITH DIFFERENTIAL/PLATELET
BASOS PCT: 1 % (ref 0–1)
Basophils Absolute: 0 10*3/uL (ref 0.0–0.1)
Eosinophils Absolute: 0 10*3/uL (ref 0.0–0.7)
Eosinophils Relative: 0 % (ref 0–5)
HCT: 37.9 % — ABNORMAL LOW (ref 39.0–52.0)
Hemoglobin: 12.7 g/dL — ABNORMAL LOW (ref 13.0–17.0)
Lymphocytes Relative: 11 % — ABNORMAL LOW (ref 12–46)
Lymphs Abs: 0.8 10*3/uL (ref 0.7–4.0)
MCH: 33.2 pg (ref 26.0–34.0)
MCHC: 33.5 g/dL (ref 30.0–36.0)
MCV: 99.2 fL (ref 78.0–100.0)
MONO ABS: 0.9 10*3/uL (ref 0.1–1.0)
Monocytes Relative: 12 % (ref 3–12)
Neutro Abs: 5.8 10*3/uL (ref 1.7–7.7)
Neutrophils Relative %: 77 % (ref 43–77)
Platelets: 246 10*3/uL (ref 150–400)
RBC: 3.82 MIL/uL — ABNORMAL LOW (ref 4.22–5.81)
RDW: 14.6 % (ref 11.5–15.5)
WBC: 7.5 10*3/uL (ref 4.0–10.5)

## 2013-05-16 LAB — COMPREHENSIVE METABOLIC PANEL
ALBUMIN: 3 g/dL — AB (ref 3.5–5.2)
ALT: 11 U/L (ref 0–53)
AST: 26 U/L (ref 0–37)
Alkaline Phosphatase: 50 U/L (ref 39–117)
BILIRUBIN TOTAL: 0.8 mg/dL (ref 0.3–1.2)
BUN: 12 mg/dL (ref 6–23)
CO2: 21 mEq/L (ref 19–32)
CREATININE: 0.72 mg/dL (ref 0.50–1.35)
Calcium: 8.4 mg/dL (ref 8.4–10.5)
Chloride: 106 mEq/L (ref 96–112)
GFR calc Af Amer: >90 mL/min (ref >90)
GFR calc non Af Amer: >90 mL/min (ref >90)
Glucose, Bld: 106 mg/dL — ABNORMAL HIGH (ref 70–99)
Potassium: 4.3 mEq/L (ref 3.7–5.3)
Sodium: 140 mEq/L (ref 137–147)
Total Protein: 6.4 g/dL (ref 6.0–8.3)

## 2013-05-16 MED ORDER — CLOPIDOGREL BISULFATE 75 MG PO TABS
75.0000 mg | ORAL_TABLET | Freq: Every day | ORAL | Status: DC
  Filled 2013-05-16: qty 1

## 2013-05-16 MED ORDER — GUAIFENESIN ER 600 MG PO TB12
600.0000 mg | ORAL_TABLET | Freq: Two times a day (BID) | ORAL | Status: DC
  Administered 2013-05-16: 600 mg via ORAL
  Filled 2013-05-16 (×2): qty 1

## 2013-05-16 MED ORDER — BENZONATATE 100 MG PO CAPS
100.0000 mg | ORAL_CAPSULE | Freq: Two times a day (BID) | ORAL | Status: DC
  Administered 2013-05-16: 100 mg via ORAL
  Filled 2013-05-16 (×2): qty 1

## 2013-05-16 MED ORDER — DULOXETINE HCL 30 MG PO CPEP
30.0000 mg | ORAL_CAPSULE | Freq: Every day | ORAL | Status: DC
  Administered 2013-05-16: 30 mg via ORAL
  Filled 2013-05-16: qty 1

## 2013-05-16 MED ORDER — PANTOPRAZOLE SODIUM 40 MG PO TBEC
40.0000 mg | DELAYED_RELEASE_TABLET | Freq: Every day | ORAL | Status: DC
  Administered 2013-05-16: 40 mg via ORAL
  Filled 2013-05-16: qty 1

## 2013-05-16 MED ORDER — LIDOCAINE VISCOUS 2 % MT SOLN
15.0000 mL | OROMUCOSAL | Status: DC | PRN
  Administered 2013-05-16: 15 mL via OROMUCOSAL
  Filled 2013-05-16: qty 15

## 2013-05-16 MED ORDER — LIDOCAINE VISCOUS 2 % MT SOLN: 15.0000 mL | OROMUCOSAL | Status: DC | PRN

## 2013-05-16 MED ORDER — IPRATROPIUM-ALBUTEROL 0.5-2.5 (3) MG/3ML IN SOLN: 3.0000 mL | Freq: Four times a day (QID) | RESPIRATORY_TRACT | Status: DC | PRN

## 2013-05-16 MED ORDER — RESOURCE THICKENUP CLEAR PO POWD
ORAL | Status: DC | PRN
  Filled 2013-05-16: qty 125

## 2013-05-16 NOTE — Discharge Instructions (Signed)
Please take all of your medications as prescribed.   We are sending in a prescription for lidocaine solution that you may put on an applicator and swab in patient's mouth.  You should also purchase zinc barrier cream over the counter to apply to diaper rash.   Don't forget to attend your [facility] follow-up appointment.   Aspiration Precautions Aspiration is the inhaling of a liquid or object into the lungs. Things that can be inhaled into the lungs include:  Food.  Any type of liquid, such as drinks or saliva.  Stomach contents, such as vomit or stomach acid. When these things go into the lungs, damage can occur. Serious complications can then result, such as:  A lung infection (pneumonia).  A collection of pus in the lungs (lung abscess). CAUSES  A decreased level of awareness (consciousness) due to:  Traumatic brain injury or head injury.  Stroke.  Neurological disease.  Seizures.  Decreased or absent gag reflex (inability to cough).  Medical conditions that affect swallowing.  Conditions that affect the food pipe (esophagus) such as a narrowing of the esophagus (esophageal stricture).  Gastroesophageal reflux (GERD). This is also known as acid reflux.  Any type of surgery where you are put under general anesthesia or have sedation.  Drinking large amounts of alcohol.  Taking medication that causes drowsiness, confusion, or weakness.  Aging.  Dental problems.  Having a feeding tube. SYMPTOMS When aspiration occurs, different signs and symptoms can occur, such as:  Coughing (if a person has a cough or gag reflex) after swallowing food or liquids.  Difficulty breathing. This can include things like:  Breathing rapidly.  Breathing very slowly.  Hearing "gurgling" lung sounds when a person breaths.  Coughing up phlegm (sputum) that is:  Yellow, tan, or green in color.  Has pieces of food in it.  Bad smelling.  A change in voice (hoarseness).  A change  in skin color. The skin may turn a "bluish" type color because of a lack of oxygen (cyanosis).  Fever. DIAGNOSIS  A chest X-ray may be performed. This takes a picture of your lungs. It can show changes in the lungs if aspiration has occurred.  A bronchoscopy may be performed. This is a surgical procedure in which a thin, flexible tube with a camera at the end is inserted into the nose or mouth. The tube is advanced to the lungs so your caregiver can view the lungs and obtain a culture, tissue sample, or remove an aspirated object.  A swallowing evaluation study may be performed to evaluate:  A person's risk of aspiration.  How difficult it is for a person to swallow.  What types of foods are safe for a person to eat. PREVENTION If you are a caregiver to someone who may aspirate, follow the directions below. If you are caring for someone who can eat and drink through their mouth:  Have them sit in an upright position when eating food or drinking fluids, such as:  Sitting up in a chair.  If sitting in a chair is not possible, position the person in bed so they are upright.  Remind the person to eat slowly and chew well.  Do not distract the person. This is especially important for people with thinking or memory (cognitive) problems.  Check the person's mouth for leftover food after eating.  Keep the person sitting upright for 30 to 45 minutes after eating.  Do not serve food or drink for at least 2 hours before  bedtime. If you are caring for someone with a feeding tube and he or she cannot eat or drink through their mouth:  Keep the person in an upright position as much as possible.  Do not  lay the person flat if they are getting continuous feedings. Turn the feeding pump off if you need to lay the person flat for any reason.  Check feeding tube residuals as directed by your caregiver. If a large amount of tube feedings are pulled back (aspirated) from the feeding tube, call  your caregiver right away. General guidelines to prevent aspiration include:  Feed small amounts of food. Do not force feed.  Use as little water as possible when brushing the person's teeth or cleaning his or her mouth.  Provide oral care before and after meals.  Never put food or fluids in the mouth of a person who is not fully alert.  Crush pills and put them in soft food such as pudding or ice cream. Some pills should not be crushed. Check with your caregiver before crushing any medication. SEEK IMMEDIATE MEDICAL CARE IF:   The person has trouble breathing or starts to breathe rapidly.  The person is breathing very slowly or stops breathing.  The person coughs a lot after eating or drinking.  The person has a chronic cough.  The person coughs up thick, yellow, or tan sputum.  The person has a fever or persistent symptoms for more than 72 hours.  The person has a fever and their symptoms suddenly get worse. Document Released: 05/16/2010 Document Revised: 07/06/2011 Document Reviewed: 05/16/2010 [facility] Patient Information 2014 [place], [place].

## 2013-05-16 NOTE — Progress Notes (Addendum)
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  Still coughing but no shortness of breath.  States that his mouth hurts (same as last admission and he would like to do lidocaine swabs again) but otherwise no pain.    Per RN, satting 93% on RA.   Objective: Vital signs in last 24 hours: Filed Vitals:   05/16/13 0414 05/16/13 0500 05/16/13 0952 05/16/13 1014  BP: 110/70     Pulse: 79     Temp: 97.4 F (36.3 C)     TempSrc: Axillary     Resp: 18     Height:  6' 0.05" (1.83 m)    Weight:  183 lb 10.3 oz (83.3 kg)    SpO2: 94%  96% 93%   Weight change:  No intake or output data in the 24 hours ending 05/16/13 1148  PEX General: alert, cooperative, NAD HEENT: NCAT, dry mouth but oropharynx non-erythematous, vision grossly intact Neck: supple, no lymphadenopathy Lungs: transmitted upper airway sounds, rhonchi at bilateral bases, mild end expiratory wheezes on left  Heart: regular rate and rhythm, no murmurs, gallops, or rubs Abdomen: soft, non-tender, non-distended, normal bowel sounds Extremities: 2+ DP/PT pulses bilaterally, no cyanosis, clubbing, or edema Neurologic: alert & oriented X3, cranial nerves II-XII intact, strength grossly intact, sensation intact to light touch  Lab Results: Basic Metabolic Panel:  Recent Labs Lab 05/15/13 1318 05/16/13 0735  NA 142 140  K 4.2 4.3  CL 106 106  CO2 22 21  GLUCOSE 109* 106*  BUN 11 12  CREATININE 0.79 0.72  CALCIUM 9.1 8.4   CBC:  Recent Labs Lab 05/15/13 1318 05/16/13 0735  WBC 10.6* 7.5  NEUTROABS 7.4 5.8  HGB 14.7 12.7*  HCT 43.1 37.9*  MCV 98.9 99.2  PLT 262 246   Cardiac Enzymes:  Recent Labs Lab 05/15/13 1418  TROPONINI <0.30   BNP:  Recent Labs Lab 05/15/13 1418  PROBNP 185.4*   Urinalysis:  Recent Labs Lab 05/15/13 1530  COLORURINE AMBER*  LABSPEC 1.024  PHURINE 6.0  GLUCOSEU NEGATIVE  HGBUR NEGATIVE  BILIRUBINUR NEGATIVE    KETONESUR NEGATIVE  PROTEINUR NEGATIVE  UROBILINOGEN 1.0  NITRITE NEGATIVE  LEUKOCYTESUR SMALL*   Studies/Results: Ct Angio Chest Pe W/cm &/or Wo Cm  05/15/2013   CLINICAL DATA:  Midline chest pain, shortness of breath, productive cough, recent diagnosis of aspiration pneumonia, evaluate for pulmonary embolism  EXAM: CT ANGIOGRAPHY CHEST WITH CONTRAST  TECHNIQUE: Multidetector CT imaging of the chest was performed using the standard protocol during bolus administration of intravenous contrast. Multiplanar CT image reconstructions including MIPs were obtained to evaluate the vascular anatomy.  CONTRAST:  27mL OMNIPAQUE IOHEXOL 350 MG/ML SOLN  COMPARISON:  05/01/2013; chest radiograph-earlier same day  FINDINGS: Vascular Findings:  There is adequate opacification of the pulmonary arterial system with the main pulmonary artery measuring 394 Hounsfield units. There are no discrete filling defects within the pulmonary arterial tree to suggest pulmonary embolism. Normal caliber of the main pulmonary artery.  Normal heart size. Coronary artery calcifications. Calcifications within the mitral valve annulus. No pericardial effusion. Normal caliber of the thoracic aorta. Eventual configuration of the aortic arch. No definite thoracic aortic dissection or periaortic stranding.  Review of the MIP images confirms the above findings.   ----------------------------------------------------------------------------------  Nonvascular Findings:  Evaluation of the pulmonary parenchyma is again degraded due to patient respiratory artifact.  Overall improved aeration of the left lower lung with persistent grossly symmetric subpleural  ground-glass atelectasis. There is minimal ground-glass atelectasis about the bilateral major fissures. No new focal airspace opacities. No pleural effusion or pneumothorax.  Re- demonstrated extensive bronchial wall thickening primarily involving the bilateral lower lobe lobar and segmental  bronchi (representative axial images 45, 49 and 51, series 6), likely minimally progressed in the interval.  Shotty bilateral hilar lymph nodes are grossly unchanged with index left infrahilar nodal conglomeration measuring approximately 1.0 cm in greatest short axis diameter (image 49, series 4) and index right suprahilar nodal conglomeration measuring approximately 1.1 cm (image 42, series 4). No mediastinal or axillary lymphadenopathy.  Early arterial phase evaluation of the upper abdomen is unremarkable.  No acute or aggressive osseus abnormalities. Regional soft tissues appear normal.  IMPRESSION: 1. No evidence of pulmonary embolism. 2. Worsening bilateral lower lobe predominant central bronchial wall thickening - these findings are nonspecific though could be seen in the setting of chronic aspiration (as compatible with provided history of recent aspiration pneumonia) though bronchitis could have a similar appearance. Further evaluation could be performed with speech swallow examination, bronchoscopy and PFTs as clinically indicated. 3. Improved aeration of the left lower lung with persistent bibasilar subsegmental atelectasis. No new focal airspace opacities. 4. Grossly unchanged shotty bilateral hilar lymph nodes, nonspecific, with though presumably reactive in etiology. 5. Coronary artery calcifications.   Electronically Signed   By: Sandi Mariscal M.D.   On: 05/15/2013 15:31   Dg Chest Portable 1 View  05/15/2013   CLINICAL DATA:  Shortness of breath  EXAM: PORTABLE CHEST - 1 VIEW  COMPARISON:  Prior CT from 05/01/2013  FINDINGS: The cardiac and mediastinal silhouettes are stable in size and contour, and remain within normal limits.  The lungs are mildly hypoinflated. There is mild perihilar vascular congestion without pulmonary edema. No focal infiltrate identified. No pneumothorax.  Recently described acute left sixth and seventh rib fractures again noted. Remotely healed right-sided rib fractures are  also again noted. No acute osseous abnormality.  IMPRESSION: Hypoinflation.  No acute cardiopulmonary abnormality identified.   Electronically Signed   By: Jeannine Boga M.D.   On: 05/15/2013 13:39   Medications: I have reviewed the patient's current medications. Scheduled Meds: . benzonatate  100 mg Oral BID  . [START ON 05/17/2013] clopidogrel  75 mg Oral Q breakfast  . enoxaparin (LOVENOX) injection  40 mg Subcutaneous Q24H  . pantoprazole (PROTONIX) IV  40 mg Intravenous Q24H     PRN Meds:.ipratropium-albuterol, lidocaine, sodium chloride Assessment/Plan: #Chronic aspiration pneumonia- Patient presented with shortness of breath and cough ?increased from baseline.  Did not meet SIRS criteria- afebrile, WBC 10.6, no tachypnea.  He was tachycardic to 100s-110s (but is tachycardic at baseline) on admission, HR 70s today.  Troponin x 1 negative, lactate 1.26 (wnl).  CTA chest showed findings consistent with chronic aspiration which is no surprise given he was admitted earlier this month for aspiration PNA.  Modified barium swallow study on 05/02/13 showed severe oral phase dysphagia.  [name] wife (POA) refused PEG tube during last admission, was counseled thoroughly on the risks of continuing to let the patient eat.  He was discharged on a dysphagia 1 diet with meds crushed in puree, but compliance is uncertain.  Called pharmacy and augmentin rx was filled after last admission.  Patient was started on vancomycin and Zosyn IV in ED, continued on Zosyn overnight but will discontinue now given no evidence of acute infection.  Flu panel negative.  - dysphagia 1 diet  - elevate head of bed   -  lidocaine solution swabs - Mucinex, tessalon pearls - incentive spirometry q2h - pulmonary toilet q4h - duonebs q6h prn   #Moisture associated skin breakdown +/- candidal infection- on buttocks -wound care consult -zinc barrier cream  #Disposition issue - Last admission, PT/OT recommended home health  with 24/7 supervision. SLP and primary team agreed SNF was a more appropriate disposition. [name] wife/HCPOA declined SNF.  - PT eval and treat   - social work consult Vito [phone]), looking into APS report - will attempt to call wife tomorrow   #CAD - continue Plavix 75 mg daily  #GERD - continue Protonix 40mg  IV daily  #Depression - continue Cymbalta 30 mg daily  #History of seizures - Takes no anti-convulsant medications at home. Unknown date of last seizure.  Dispo: Disposition is deferred at this time, awaiting improvement of current medical problems.  Anticipated discharge tomorrow.   The patient does have a current PCP Otho Bellows, MD) and does need an [facility] hospital follow-up appointment after discharge.   .Services Needed at time of discharge: Y = Yes, Blank = No PT:   OT:   RN:   Equipment:   Other:     LOS: 1 day   Ivin Poot, MD 05/16/2013, 11:48 AM

## 2013-05-16 NOTE — Evaluation (Signed)
Physical Therapy Evaluation Patient Details Name: Todd [name] MRN: [id number] DOB: 1946-04-09 Today's Date: 05/16/2013 Time: 7124-5809 PT Time Calculation (min): 42 min  PT Assessment / Plan / Recommendation History of Present Illness  Pt readmitted with aspiration PNA. Pt with hx of TBI.  Clinical Impression  Pt admitted with above. Pt currently with functional limitations due to the deficits listed below (see PT Problem List).  Pt will benefit from skilled PT to increase their independence and safety with mobility to allow discharge to the venue listed below. Currently pt unable to get up without physical assist.  If pt returns home he needs 24 hour assist.  If this not available recommend ST-SNF (per old chart pt's wife has refused this in the past      PT Assessment  Patient needs continued PT services    Follow Up Recommendations  Supervision/Assistance - 24 hour;Home health PT;SNF    Does the patient have the potential to tolerate intense rehabilitation      Barriers to Discharge        Equipment Recommendations  None recommended by PT    Recommendations for Other Services     Frequency Min 3X/week    Precautions / Restrictions Precautions Precautions: Fall   Pertinent Vitals/Pain See flow sheet      Mobility  Bed Mobility Overal bed mobility: Needs Assistance Supine to sit: Min assist;HOB elevated General bed mobility comments: Assist to bring trunk up. Transfers Overall transfer level: Needs assistance Equipment used: Rolling walker (2 wheeled) Transfers: Sit to/from [facility] Sit to Stand: Mod assist Stand pivot transfers: Mod assist General transfer comment: Assist to bring hips up and for balance due to initial posterior lean. Ambulation/Gait Ambulation/Gait assistance: Min assist Ambulation Distance (Feet): 12 Feet Assistive device: Rolling walker (2 wheeled) Gait Pattern/deviations: Step-through pattern;Decreased step length  - right;Decreased step length - left;Decreased weight shift to left;Decreased weight shift to right Gait velocity: decreased Gait velocity interpretation: Below normal speed for age/gender General Gait Details: Pt with decr and hip and knee flexion during gait.    Exercises     PT Diagnosis: Difficulty walking;Generalized weakness  PT Problem List: Decreased balance;Decreased strength;Decreased activity tolerance;Decreased mobility;Decreased coordination PT Treatment Interventions: DME instruction;Gait training;Functional mobility training;Therapeutic activities;Therapeutic exercise;Balance training;Patient/family education     PT Goals(Current goals can be found in the care plan section) Acute Rehab PT Goals PT Goal Formulation: With patient Time For Goal Achievement: 05/23/13 Potential to Achieve Goals: Good  Visit Information  Last PT Received On: 05/16/13 Assistance Needed: +2 (helpful to follow with chair and lines) History of Present Illness: Pt readmitted with aspiration PNA. Pt with hx of TBI.       Prior [place] expects to be discharged to:: Private residence Living Arrangements: Spouse/significant other Available Help at Discharge: Family;Available PRN/intermittently Type of Home: Apartment Home Access: Stairs to enter Entrance Stairs-Number of Steps: 12 Entrance Stairs-Rails: Can reach both Home Layout: One level Home Equipment: Walker - 2 wheels;Grab bars - toilet;Grab bars - tub/shower;Wheelchair - manual Prior Function Level of Independence: Needs assistance Gait / Transfers Assistance Needed: Amb with RW (per pt could do this on his own until recent hospitalization) ADL's / [place]: Per old chart pt has aide at home some of the time. Communication / Swallowing Assistance Needed: limited verbal communication Communication Communication: Expressive difficulties Dominant Hand: Right    Cognition   Cognition Arousal/Alertness: Awake/alert Behavior During Therapy: WFL for tasks assessed/performed Overall Cognitive Status:  History of cognitive impairments - at baseline    Extremity/Trunk Assessment Upper Extremity Assessment Upper Extremity Assessment: Generalized weakness Lower Extremity Assessment Lower Extremity Assessment: Generalized weakness   Balance Balance Sitting-balance support: No upper extremity supported Sitting balance-Leahy Scale: Fair Standing balance support: Bilateral upper extremity supported Standing balance-Leahy Scale: Poor  End of Session PT - End of Session Equipment Utilized During Treatment: Gait belt Activity Tolerance: Patient tolerated treatment well Patient left: in chair;with chair alarm set;with call bell/phone within reach Nurse Communication: Mobility status  GP     [facility] 05/16/2013, 12:38 PM  [facility] PT [phone]

## 2013-05-16 NOTE — H&P (Signed)
INTERNAL MEDICINE TEACHING SERVICE Attending Admission Note  Date: 05/16/2013  Patient name: Todd [name]  Medical record number: [id number]  Date of birth: [birth year]    I have seen and evaluated [name] and discussed their care with the Residency Team.  68 yr old male with pmhx significant for traumatic brain injury, HTN, CAD, Hep C, osteoporosis, who was recently admitted for aspiration pneumonia, presented with SOB. He stated he has been SOB for weeks, but has improved. He admits to a cough. Admits to fever and chills. No N/V. He finished course of augmentin. In the ED, he was treated with Vanc and Zosyn. On exam, he has mild wheeze bilat. He is in NAD.  He has evidence of moisture associated skin breakdown on groin and buttocks. I would treat for candidal infx with nystatin powder. At this time, he does not meet SIRS criteria. He has evidence of chronic aspiration, which we have addressed before. He has no evidence of hypoxia or respiratory failure. He does not need a repeat SLP eval. He will need to follow dysphagia diet as before with aspiration precautions. We have addressed the likelihood of recurrent aspiration with his wife before and she prefers to continued PO feeds. At this time, he is medically stable for D/C. Outpatient follow up.  Dominic Pea, DO, [facility] 05/16/2013, 3:59 PM

## 2013-05-16 NOTE — Progress Notes (Signed)
[facility]  Patient Status: Active (receiving services up to time of hospitalization)  [facility] is providing the following services: RN and PT  If patient discharges after hours, please call [phone].   Todd [name] 05/16/2013, 1:11 PM

## 2013-05-16 NOTE — Progress Notes (Signed)
I have attempted to call patient's wife, Mrs. Nolon Rod, three times today without success; admitting team yesterday were also never able to contact her despite multiple attempts.  Unable to discharge patient until speaking with her to determine disposition plan.

## 2013-05-16 NOTE — Progress Notes (Signed)
CSW contacted Arville Go at [facility] APS Case was received on 05/05/2013 and Ernest Pine is the Case Manager working on the case.   CSW then spoke with Ms. Joya Gaskins who stated that hospital originally had concerns about fractures and that the wife was taking the Pt's medication. These allegations are not substantiated at this time.    APS worker will be keeping the case open for the next two weeks and will make several home visits, however there are no concerns on their part for Pt d/c'ing back home with spouse.   CSW will continue to follow Pt for d/c planning.    [facility]  [address] 1[address];  [phone] Phone: [phone]

## 2013-05-16 NOTE — Progress Notes (Signed)
[name] discharged Home with wife per MD order.  Discharge instructions reviewed and discussed with the patient's wife, [name] over the phone, all questions and concerns answered. Copy of instructions, care notes for new medications & diagnosis and scripts mailed to patient.    Medication List         albuterol 108 (90 BASE) MCG/ACT inhaler  Commonly known as:  PROAIR HFA  Inhale 2 puffs into the lungs every 6 (six) hours as needed for wheezing.     budesonide-formoterol 80-4.5 MCG/ACT inhaler  Commonly known as:  SYMBICORT  Inhale 2 puffs into the lungs 2 (two) times daily.     clopidogrel 75 MG tablet  Commonly known as:  PLAVIX  Take 1 tablet (75 mg total) by mouth every morning.     desonide 0.05 % cream  Commonly known as:  DESOWEN  Apply topically 2 (two) times daily. Apply to the affected area twice a day     DULoxetine 60 MG capsule  Commonly known as:  CYMBALTA  Take 60 mg by mouth daily.     fluticasone 50 MCG/ACT nasal spray  Commonly known as:  FLONASE  Place 2 sprays into the nose 2 (two) times daily.     ipratropium-albuterol 0.5-2.5 (3) MG/3ML Soln  Commonly known as:  DUONEB  Take 3 mLs by nebulization every 6 (six) hours as needed. For wheezing     lidocaine 2 % solution  Commonly known as:  XYLOCAINE  Use as directed 15 mLs in the mouth or throat every 4 (four) hours as needed for mouth pain.     loratadine 10 MG tablet  Commonly known as:  CLARITIN  Take 10 mg by mouth daily.     metoprolol tartrate 25 MG tablet  Commonly known as:  LOPRESSOR  Take 1 tablet (25 mg total) by mouth every morning.     MUCINEX 600 MG 12 hr tablet  Generic drug:  guaiFENesin  Take 600 mg by mouth 2 (two) times daily.     omeprazole 40 MG capsule  Commonly known as:  PRILOSEC  Take 1 capsule (40 mg total) by mouth daily.     polyethylene glycol packet  Commonly known as:  MIRALAX / GLYCOLAX  Take 17 g by mouth daily.     senna-docusate  8.6-50 MG per tablet  Commonly known as:  Senokot-S  Take 1 tablet by mouth daily.     traMADol 50 MG tablet  Commonly known as:  ULTRAM  Take 1 tablet (50 mg total) by mouth every 6 (six) hours as needed for pain.        Patients skin is clean, dry and intact, with excoriation to perinium area. IV site discontinued and catheter remains intact. Site without signs and symptoms of complications. Dressing and pressure applied.  Patient escorted home by [facility] via stretcher,  no distress noted upon discharge.  Lulia Schriner C 05/16/2013 4:00 PM

## 2013-05-16 NOTE — Progress Notes (Signed)
Writer of this note unable to place pt on continuous pulse oximeter monitor d/t tele box malfunction. Unable to change pt tele box d/t shortage. Check pt pulse ox every routine vital signs. Pt pulse ox above 90s at 2LPM. Will continue to monitor.

## 2013-05-16 NOTE — Consult Note (Signed)
WOC wound consult note Reason for Consult: evaluate back wound, sacrum/buttocks. Pt able to communicate with [place] nurse at bedside, answers questions appropriately. Hx TBI>20 years ago cared for by wife at home.  Incontinence of B/B.  History of I &D which my partner evaluated 05/01/13, will continue packing of this wound.  Wound type: Left upper back wound s/p I &D MASD (moisture associated skin damage) over the scrotum, perineal, and buttocks with satellite lesion consistent with candida. Pressure Ulcer POA: No Measurement: Left shoulder: 1.0cm x 0.5cm x 1.0cm  Large area of redness that extends over the bilateral buttocks, in the gluteal cleft, perineal area and over the posterior scrotum.- did not measure this area Wound bed: Left shoulder: clean, pink, beefy base Drainage (amount, consistency, odor) yellow, serous from the left shoulder. Periwound: intact at the left shoulder, see description of the buttocks.  Dressing procedure/placement/frequency: Continue iodoform packing for the left shoulder for the antibacterial effect and allow for the wound drainage to be wicked to the topper.  Zinc based barrier for the MASD, would consider adding PO Diflucan to assist in clearing of the candida if the area is problematic for the patient.  Protect the affected skin with zinc based barrier until this clears up.   Discussed POC with patient and bedside nurse.  Re consult if needed, will not follow at this time. Thanks  Randi Poullard Kellogg, [place] [phone])

## 2013-05-16 NOTE — Progress Notes (Signed)
Spoke to patient's wife, Mrs. Nolon Rod, who was at bedside.  Explained patient's hospital course and that patient is ready for discharge.  She would like [place] to arrange an ambulance home, and she will meet them.

## 2013-05-16 NOTE — Care Management Note (Signed)
    Page 1 of 1   05/16/2013     2:52:14 PM   CARE MANAGEMENT NOTE 05/16/2013  Patient:  JASAI, SORG   Account Number:  [id number]  Date Initiated:  05/16/2013  Documentation initiated by:  Tomi Bamberger  Subjective/Objective Assessment:   dx Asp PNA  admit- lives with spouse. Active with [facility] for HHPT, HHRN.     Action/Plan:   Anticipated DC Date:  05/16/2013   Anticipated DC Plan:  [place]  CM consult      [facility] Choice  HOME HEALTH  Resumption Of Svcs/PTA Provider   Choice offered to / List presented to:  C-3 Spouse        HH arranged  HH-2 PT  HH-1 RN      [place].   Status of service:  Completed, signed off Medicare Important Message given?   (If response is "NO", the following Medicare IM given date fields will be blank) Date Medicare IM given:   Date Additional Medicare IM given:    Discharge Disposition:  [place]  Per UR Regulation:  Reviewed for med. necessity/level of care/duration of stay  If discussed at [place] of Stay Meetings, dates discussed:    Comments:  1/20!5 14:50 Tomi Bamberger RN, BSN [phone] patient lives with spouse, active with [facility] for [facility], [place], notified [place] that patient is for dc today, they will resume.  Patient will also need transportation home via ambulance.  CSW notified.
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
## 2013-05-16 NOTE — Progress Notes (Signed)
UR completed. Lavonya Hoerner RN CCM Case Mgmt 

## 2013-05-16 NOTE — Progress Notes (Signed)
Pt became combative when RN and tech tried to give pt a peri care. Pt on mittens d/t pulling his IVs. Will continue to monitor.

## 2013-05-17 NOTE — Discharge Summary (Signed)
  Date: 05/17/2013  Patient name: Todd [name]  Medical record number: [id number]  Date of birth: 09/24/1945   This patient has been seen and the plan of care was discussed with the house staff. Please see their note for complete details. I concur with their findings and plan.  Dominic Pea, DO, [facility] 05/17/2013, 4:21 PM

## 2013-05-18 ENCOUNTER — Ambulatory Visit: Payer: Medicare Other | Admitting: Internal Medicine

## 2013-05-18 ENCOUNTER — Inpatient Hospital Stay (HOSPITAL_COMMUNITY)
Admission: EM | Admit: 2013-05-18 | Discharge: 2013-05-19 | DRG: 189 | Disposition: A | Discharge status: Skilled Nursing Facility | Payer: Medicare Other | Attending: Internal Medicine | Admitting: Internal Medicine

## 2013-05-18 ENCOUNTER — Emergency Department (HOSPITAL_COMMUNITY): Payer: Medicare Other

## 2013-05-18 ENCOUNTER — Encounter (HOSPITAL_COMMUNITY): Payer: Self-pay | Admitting: Emergency Medicine

## 2013-05-18 DIAGNOSIS — F3289 Other specified depressive episodes: Secondary | ICD-10-CM | POA: Diagnosis present

## 2013-05-18 DIAGNOSIS — K219 Gastro-esophageal reflux disease without esophagitis: Secondary | ICD-10-CM | POA: Diagnosis present

## 2013-05-18 DIAGNOSIS — Z87820 Personal history of traumatic brain injury: Secondary | ICD-10-CM

## 2013-05-18 DIAGNOSIS — L0291 Cutaneous abscess, unspecified: Secondary | ICD-10-CM | POA: Diagnosis present

## 2013-05-18 DIAGNOSIS — Z87891 Personal history of nicotine dependence: Secondary | ICD-10-CM

## 2013-05-18 DIAGNOSIS — R4701 Aphasia: Secondary | ICD-10-CM | POA: Diagnosis present

## 2013-05-18 DIAGNOSIS — R0902 Hypoxemia: Secondary | ICD-10-CM | POA: Insufficient documentation

## 2013-05-18 DIAGNOSIS — F329 Major depressive disorder, single episode, unspecified: Secondary | ICD-10-CM | POA: Diagnosis present

## 2013-05-18 DIAGNOSIS — S2239XA Fracture of one rib, unspecified side, initial encounter for closed fracture: Secondary | ICD-10-CM | POA: Diagnosis present

## 2013-05-18 DIAGNOSIS — IMO0002 Reserved for concepts with insufficient information to code with codable children: Secondary | ICD-10-CM | POA: Diagnosis present

## 2013-05-18 DIAGNOSIS — Z9119 Patient's noncompliance with other medical treatment and regimen: Secondary | ICD-10-CM

## 2013-05-18 DIAGNOSIS — Z8701 Personal history of pneumonia (recurrent): Secondary | ICD-10-CM

## 2013-05-18 DIAGNOSIS — T17908A Unspecified foreign body in respiratory tract, part unspecified causing other injury, initial encounter: Secondary | ICD-10-CM | POA: Diagnosis present

## 2013-05-18 DIAGNOSIS — Z8673 Personal history of transient ischemic attack (TIA), and cerebral infarction without residual deficits: Secondary | ICD-10-CM

## 2013-05-18 DIAGNOSIS — Z9181 History of falling: Secondary | ICD-10-CM

## 2013-05-18 DIAGNOSIS — S2249XA Multiple fractures of ribs, unspecified side, initial encounter for closed fracture: Secondary | ICD-10-CM | POA: Diagnosis present

## 2013-05-18 DIAGNOSIS — Z85828 Personal history of other malignant neoplasm of skin: Secondary | ICD-10-CM

## 2013-05-18 DIAGNOSIS — B372 Candidiasis of skin and nail: Secondary | ICD-10-CM

## 2013-05-18 DIAGNOSIS — J9600 Acute respiratory failure, unspecified whether with hypoxia or hypercapnia: Principal | ICD-10-CM | POA: Diagnosis present

## 2013-05-18 DIAGNOSIS — R1310 Dysphagia, unspecified: Secondary | ICD-10-CM | POA: Diagnosis present

## 2013-05-18 DIAGNOSIS — R239 Unspecified skin changes: Secondary | ICD-10-CM | POA: Diagnosis present

## 2013-05-18 DIAGNOSIS — J9601 Acute respiratory failure with hypoxia: Secondary | ICD-10-CM

## 2013-05-18 DIAGNOSIS — J189 Pneumonia, unspecified organism: Secondary | ICD-10-CM

## 2013-05-18 DIAGNOSIS — Z91199 Patient's noncompliance with other medical treatment and regimen due to unspecified reason: Secondary | ICD-10-CM

## 2013-05-18 DIAGNOSIS — R32 Unspecified urinary incontinence: Secondary | ICD-10-CM

## 2013-05-18 DIAGNOSIS — I2510 Atherosclerotic heart disease of native coronary artery without angina pectoris: Secondary | ICD-10-CM | POA: Diagnosis present

## 2013-05-18 LAB — CBC WITH DIFFERENTIAL/PLATELET
BASOS ABS: 0 10*3/uL (ref 0.0–0.1)
BASOS PCT: 1 % (ref 0–1)
EOS PCT: 0 % (ref 0–5)
Eosinophils Absolute: 0 10*3/uL (ref 0.0–0.7)
HCT: 41 % (ref 39.0–52.0)
Hemoglobin: 13.4 g/dL (ref 13.0–17.0)
LYMPHS PCT: 11 % — AB (ref 12–46)
Lymphs Abs: 0.8 10*3/uL (ref 0.7–4.0)
MCH: 32.1 pg (ref 26.0–34.0)
MCHC: 32.7 g/dL (ref 30.0–36.0)
MCV: 98.1 fL (ref 78.0–100.0)
MONO ABS: 0.7 10*3/uL (ref 0.1–1.0)
Monocytes Relative: 10 % (ref 3–12)
Neutro Abs: 5.9 10*3/uL (ref 1.7–7.7)
Neutrophils Relative %: 79 % — ABNORMAL HIGH (ref 43–77)
Platelets: 253 10*3/uL (ref 150–400)
RBC: 4.18 MIL/uL — ABNORMAL LOW (ref 4.22–5.81)
RDW: 14.3 % (ref 11.5–15.5)
WBC: 7.5 10*3/uL (ref 4.0–10.5)

## 2013-05-18 LAB — BLOOD GAS, ARTERIAL
ACID-BASE EXCESS: 0.1 mmol/L (ref 0.0–2.0)
BICARBONATE: 24.3 meq/L — AB (ref 20.0–24.0)
Drawn by: 257701
O2 Content: 2 L/min
O2 Saturation: 89.9 %
PO2 ART: 56.9 mmHg — AB (ref 80.0–100.0)
Patient temperature: 98.6
TCO2: 21.5 mmol/L (ref 0–100)
pCO2 arterial: 40.2 mmHg (ref 35.0–45.0)
pH, Arterial: 7.398 (ref 7.350–7.450)

## 2013-05-18 LAB — BASIC METABOLIC PANEL
BUN: 10 mg/dL (ref 6–23)
CO2: 25 meq/L (ref 19–32)
CREATININE: 0.73 mg/dL (ref 0.50–1.35)
Calcium: 9 mg/dL (ref 8.4–10.5)
Chloride: 103 mEq/L (ref 96–112)
GFR calc Af Amer: >90 mL/min (ref >90)
GFR calc non Af Amer: >90 mL/min (ref >90)
Glucose, Bld: 125 mg/dL — ABNORMAL HIGH (ref 70–99)
Potassium: 4.3 mEq/L (ref 3.7–5.3)
Sodium: 139 mEq/L (ref 137–147)

## 2013-05-18 LAB — CG4 I-STAT (LACTIC ACID): Lactic Acid, Venous: 1.17 mmol/L (ref 0.5–2.2)

## 2013-05-18 LAB — URINALYSIS, ROUTINE W REFLEX MICROSCOPIC
BILIRUBIN URINE: NEGATIVE
GLUCOSE, UA: NEGATIVE mg/dL
HGB URINE DIPSTICK: NEGATIVE
KETONES UR: NEGATIVE mg/dL
Leukocytes, UA: NEGATIVE
Nitrite: NEGATIVE
PROTEIN: NEGATIVE mg/dL
Specific Gravity, Urine: 1.013 (ref 1.005–1.030)
UROBILINOGEN UA: 1 mg/dL (ref 0.0–1.0)
pH: 7.5 (ref 5.0–8.0)

## 2013-05-18 LAB — INFLUENZA PANEL BY PCR (TYPE A & B)
H1N1FLUPCR: NOT DETECTED
INFLAPCR: NEGATIVE
INFLBPCR: NEGATIVE

## 2013-05-18 LAB — POCT I-STAT TROPONIN I: Troponin i, poc: 0 ng/mL (ref 0.00–0.08)

## 2013-05-18 MED ORDER — HEPARIN SODIUM (PORCINE) 5000 UNIT/ML IJ SOLN
5000.0000 [IU] | Freq: Three times a day (TID) | INTRAMUSCULAR | Status: DC
  Administered 2013-05-19 (×3): 5000 [IU] via SUBCUTANEOUS
  Filled 2013-05-18 (×5): qty 1

## 2013-05-18 MED ORDER — LEVALBUTEROL HCL 0.63 MG/3ML IN NEBU
0.6300 mg | INHALATION_SOLUTION | Freq: Four times a day (QID) | RESPIRATORY_TRACT | Status: DC
  Administered 2013-05-18 – 2013-05-19 (×3): 0.63 mg via RESPIRATORY_TRACT
  Filled 2013-05-18 (×7): qty 3

## 2013-05-18 MED ORDER — ALBUTEROL (5 MG/ML) CONTINUOUS INHALATION SOLN
10.0000 mg/h | INHALATION_SOLUTION | Freq: Once | RESPIRATORY_TRACT | Status: AC
  Administered 2013-05-18: 10 mg/h via RESPIRATORY_TRACT
  Filled 2013-05-18: qty 20

## 2013-05-18 MED ORDER — VANCOMYCIN HCL 10 G IV SOLR
1500.0000 mg | Freq: Two times a day (BID) | INTRAVENOUS | Status: DC
  Filled 2013-05-18: qty 1500

## 2013-05-18 MED ORDER — DEXTROSE 5 % IV SOLN
1.0000 g | INTRAVENOUS | Status: AC
  Administered 2013-05-18: 1 g via INTRAVENOUS
  Filled 2013-05-18: qty 1

## 2013-05-18 MED ORDER — SODIUM CHLORIDE 0.9 % IV SOLN: 250.0000 mL | INTRAVENOUS | Status: DC

## 2013-05-18 MED ORDER — LIDOCAINE VISCOUS 2 % MT SOLN
15.0000 mL | OROMUCOSAL | Status: DC | PRN
  Filled 2013-05-18: qty 15

## 2013-05-18 MED ORDER — BUDESONIDE-FORMOTEROL FUMARATE 80-4.5 MCG/ACT IN AERO
2.0000 | INHALATION_SPRAY | Freq: Two times a day (BID) | RESPIRATORY_TRACT | Status: DC
  Administered 2013-05-19: 2 via RESPIRATORY_TRACT
  Filled 2013-05-18: qty 6.9

## 2013-05-18 MED ORDER — IPRATROPIUM BROMIDE 0.02 % IN SOLN
0.5000 mg | Freq: Four times a day (QID) | RESPIRATORY_TRACT | Status: DC
  Administered 2013-05-18 – 2013-05-19 (×4): 0.5 mg via RESPIRATORY_TRACT
  Filled 2013-05-18 (×5): qty 2.5

## 2013-05-18 MED ORDER — PANTOPRAZOLE SODIUM 40 MG PO TBEC
40.0000 mg | DELAYED_RELEASE_TABLET | Freq: Every day | ORAL | Status: DC
  Administered 2013-05-19: 40 mg via ORAL
  Filled 2013-05-18: qty 1

## 2013-05-18 MED ORDER — STARCH (THICKENING) PO POWD
ORAL | Status: DC | PRN
  Filled 2013-05-18: qty 227

## 2013-05-18 MED ORDER — ALBUTEROL SULFATE (2.5 MG/3ML) 0.083% IN NEBU: 2.5000 mg | INHALATION_SOLUTION | RESPIRATORY_TRACT | Status: DC | PRN

## 2013-05-18 MED ORDER — SODIUM CHLORIDE 0.9 % IJ SOLN: 3.0000 mL | INTRAMUSCULAR | Status: DC | PRN

## 2013-05-18 MED ORDER — SODIUM CHLORIDE 0.9 % IJ SOLN: 3.0000 mL | Freq: Two times a day (BID) | INTRAMUSCULAR | Status: DC

## 2013-05-18 MED ORDER — DULOXETINE HCL 60 MG PO CPEP
60.0000 mg | ORAL_CAPSULE | Freq: Every day | ORAL | Status: DC
  Administered 2013-05-19: 60 mg via ORAL
  Filled 2013-05-18: qty 1

## 2013-05-18 MED ORDER — LORATADINE 10 MG PO TABS
10.0000 mg | ORAL_TABLET | Freq: Every day | ORAL | Status: DC
  Administered 2013-05-19: 10 mg via ORAL
  Filled 2013-05-18: qty 1

## 2013-05-18 MED ORDER — SODIUM CHLORIDE 0.9 % IV BOLUS (SEPSIS)
500.0000 mL | Freq: Once | INTRAVENOUS | Status: AC
  Administered 2013-05-18: 500 mL via INTRAVENOUS

## 2013-05-18 MED ORDER — VANCOMYCIN HCL 10 G IV SOLR
1500.0000 mg | INTRAVENOUS | Status: DC
  Filled 2013-05-18 (×2): qty 1500

## 2013-05-18 MED ORDER — DEXTROSE 5 % IV SOLN
1.0000 g | Freq: Three times a day (TID) | INTRAVENOUS | Status: DC
  Filled 2013-05-18: qty 1

## 2013-05-18 MED ORDER — SODIUM CHLORIDE 0.9 % IV SOLN
INTRAVENOUS | Status: DC
  Administered 2013-05-19: via INTRAVENOUS

## 2013-05-18 MED ORDER — METOPROLOL TARTRATE 25 MG PO TABS
25.0000 mg | ORAL_TABLET | Freq: Every morning | ORAL | Status: DC
  Administered 2013-05-19: 25 mg via ORAL
  Filled 2013-05-18: qty 1

## 2013-05-18 MED ORDER — PIPERACILLIN-TAZOBACTAM 3.375 G IVPB
3.3750 g | Freq: Three times a day (TID) | INTRAVENOUS | Status: DC
  Administered 2013-05-19 (×2): 3.375 g via INTRAVENOUS
  Filled 2013-05-18 (×4): qty 50

## 2013-05-18 MED ORDER — CLINDAMYCIN PHOSPHATE 600 MG/50ML IV SOLN
600.0000 mg | Freq: Once | INTRAVENOUS | Status: AC
  Administered 2013-05-18: 600 mg via INTRAVENOUS
  Filled 2013-05-18 (×2): qty 50

## 2013-05-18 MED ORDER — HYDROCORTISONE 1 % EX CREA
TOPICAL_CREAM | Freq: Two times a day (BID) | CUTANEOUS | Status: DC
  Administered 2013-05-19 (×2): via TOPICAL
  Filled 2013-05-18: qty 28

## 2013-05-18 MED ORDER — POLYETHYLENE GLYCOL 3350 17 G PO PACK
17.0000 g | PACK | Freq: Every day | ORAL | Status: DC
  Administered 2013-05-19: 17 g via ORAL
  Filled 2013-05-18: qty 1

## 2013-05-18 MED ORDER — SODIUM CHLORIDE 0.9 % IV SOLN: 250.0000 mL | INTRAVENOUS | Status: DC | PRN

## 2013-05-18 MED ORDER — SODIUM CHLORIDE 0.9 % IJ SOLN
3.0000 mL | Freq: Two times a day (BID) | INTRAMUSCULAR | Status: DC
  Administered 2013-05-19: 3 mL via INTRAVENOUS

## 2013-05-18 MED ORDER — GUAIFENESIN ER 600 MG PO TB12
600.0000 mg | ORAL_TABLET | Freq: Two times a day (BID) | ORAL | Status: DC
  Administered 2013-05-19 (×2): 600 mg via ORAL
  Filled 2013-05-18 (×3): qty 1

## 2013-05-18 MED ORDER — CLOPIDOGREL BISULFATE 75 MG PO TABS
75.0000 mg | ORAL_TABLET | Freq: Every morning | ORAL | Status: DC
  Administered 2013-05-19: 75 mg via ORAL
  Filled 2013-05-18: qty 1

## 2013-05-18 MED ORDER — ALBUTEROL SULFATE HFA 108 (90 BASE) MCG/ACT IN AERS: 1.0000 | INHALATION_SPRAY | RESPIRATORY_TRACT | Status: DC | PRN

## 2013-05-18 NOTE — ED Notes (Signed)
RT at bedside for blood gas

## 2013-05-18 NOTE — ED Notes (Signed)
[facility] here to take pt to [facility].
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
## 2013-05-18 NOTE — ED Notes (Signed)
RT called for ABG.
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
## 2013-05-18 NOTE — Progress Notes (Signed)
Return call from Dr. Margart Sickles, informed him that pt. Had arrived from [facility] ED.  Also informed him that pt. Was on Dysphagia 1 diet, with honey thick liquid on last admission, which was d/c'd on 1/20.  Alphonzo Lemmings, RN

## 2013-05-18 NOTE — ED Notes (Signed)
Bed: LZ76 Expected date:  Expected time:  Means of arrival:  Comments: EMS- Weakness, SOB

## 2013-05-18 NOTE — Progress Notes (Signed)
Pt. Placed on telemetry box #20, pt. Running NSR.  Paged admitting MD @ [phone] to let them know pt. Has arrived.  Will await for return call.  Will continue to monitor.  Alphonzo Lemmings, RN
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
## 2013-05-18 NOTE — Progress Notes (Signed)
Patient admitted to 14W 22 from [place] ED. Alert and oriented x 2; no complaints of pain; IV in left FA saline locked;
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
## 2013-05-18 NOTE — Progress Notes (Signed)
Still no return call from MD.  Text paged MD at [phone].  Will await for return call.  Alphonzo Lemmings, RN

## 2013-05-18 NOTE — Progress Notes (Signed)
Still no return call from admitting MD, repaged [phone].  Will continue to await for return call.  Alphonzo Lemmings, RN

## 2013-05-18 NOTE — ED Notes (Signed)
Pt is from home where his wife is struggling to care for him as he requires around the clock total care.  EMS was called today bc "pt was released from the hospital too soon and needs to go back"  Pt was in the hospital for URI type symptoms and still has symptoms of the same.  VSS for ems

## 2013-05-19 ENCOUNTER — Encounter (HOSPITAL_COMMUNITY): Payer: Self-pay

## 2013-05-19 DIAGNOSIS — J9600 Acute respiratory failure, unspecified whether with hypoxia or hypercapnia: Principal | ICD-10-CM

## 2013-05-19 DIAGNOSIS — X58XXXA Exposure to other specified factors, initial encounter: Secondary | ICD-10-CM

## 2013-05-19 DIAGNOSIS — R239 Unspecified skin changes: Secondary | ICD-10-CM | POA: Diagnosis present

## 2013-05-19 DIAGNOSIS — R4701 Aphasia: Secondary | ICD-10-CM

## 2013-05-19 DIAGNOSIS — R0902 Hypoxemia: Secondary | ICD-10-CM

## 2013-05-19 DIAGNOSIS — IMO0002 Reserved for concepts with insufficient information to code with codable children: Secondary | ICD-10-CM

## 2013-05-19 DIAGNOSIS — S2239XA Fracture of one rib, unspecified side, initial encounter for closed fracture: Secondary | ICD-10-CM

## 2013-05-19 DIAGNOSIS — R1310 Dysphagia, unspecified: Secondary | ICD-10-CM

## 2013-05-19 DIAGNOSIS — J690 Pneumonitis due to inhalation of food and vomit: Secondary | ICD-10-CM

## 2013-05-19 LAB — BASIC METABOLIC PANEL
BUN: 12 mg/dL (ref 6–23)
CHLORIDE: 109 meq/L (ref 96–112)
CO2: 25 meq/L (ref 19–32)
CREATININE: 0.72 mg/dL (ref 0.50–1.35)
Calcium: 8.6 mg/dL (ref 8.4–10.5)
GFR calc Af Amer: >90 mL/min (ref >90)
GFR calc non Af Amer: >90 mL/min (ref >90)
GLUCOSE: 90 mg/dL (ref 70–99)
POTASSIUM: 3.9 meq/L (ref 3.7–5.3)
Sodium: 145 mEq/L (ref 137–147)

## 2013-05-19 LAB — CBC
HEMATOCRIT: 38.1 % — AB (ref 39.0–52.0)
HEMOGLOBIN: 12.5 g/dL — AB (ref 13.0–17.0)
MCH: 32.5 pg (ref 26.0–34.0)
MCHC: 32.8 g/dL (ref 30.0–36.0)
MCV: 99 fL (ref 78.0–100.0)
Platelets: 223 10*3/uL (ref 150–400)
RBC: 3.85 MIL/uL — ABNORMAL LOW (ref 4.22–5.81)
RDW: 14.3 % (ref 11.5–15.5)
WBC: 5 10*3/uL (ref 4.0–10.5)

## 2013-05-19 LAB — BLOOD GAS, ARTERIAL
Acid-Base Excess: 2.3 mmol/L — ABNORMAL HIGH (ref 0.0–2.0)
BICARBONATE: 26.6 meq/L — AB (ref 20.0–24.0)
Drawn by: 39767
O2 Content: 2.5 L/min
O2 SAT: 94.8 %
PATIENT TEMPERATURE: 98.6
TCO2: 28 mmol/L (ref 0–100)
pCO2 arterial: 43.4 mmHg (ref 35.0–45.0)
pH, Arterial: 7.405 (ref 7.350–7.450)
pO2, Arterial: 70 mmHg — ABNORMAL LOW (ref 80.0–100.0)

## 2013-05-19 MED ORDER — NYSTATIN 100000 UNIT/GM EX POWD
Freq: Three times a day (TID) | CUTANEOUS | Status: DC
  Filled 2013-05-19: qty 15

## 2013-05-19 MED ORDER — ALBUTEROL SULFATE (2.5 MG/3ML) 0.083% IN NEBU: 2.5000 mg | INHALATION_SOLUTION | Freq: Four times a day (QID) | RESPIRATORY_TRACT | Status: DC | PRN

## 2013-05-19 MED ORDER — FLUCONAZOLE 150 MG PO TABS
150.0000 mg | ORAL_TABLET | Freq: Once | ORAL | Status: AC
  Administered 2013-05-19: 150 mg via ORAL
  Filled 2013-05-19: qty 1

## 2013-05-19 MED ORDER — CLINDAMYCIN PHOSPHATE 600 MG/50ML IV SOLN
600.0000 mg | Freq: Three times a day (TID) | INTRAVENOUS | Status: DC
  Administered 2013-05-19: 600 mg via INTRAVENOUS
  Filled 2013-05-19 (×3): qty 50

## 2013-05-19 MED ORDER — NYSTATIN 100000 UNIT/GM EX POWD: 1.0000 | Freq: Three times a day (TID) | CUTANEOUS | Status: DC

## 2013-05-19 MED ORDER — CLINDAMYCIN HCL 300 MG PO CAPS: 600.0000 mg | ORAL_CAPSULE | Freq: Three times a day (TID) | ORAL | Status: DC

## 2013-05-19 MED ORDER — SODIUM CHLORIDE 0.9 % IV SOLN
INTRAVENOUS | Status: DC
  Administered 2013-05-19: 09:00:00 via INTRAVENOUS

## 2013-05-19 NOTE — Discharge Summary (Signed)
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 home Metoprolol while inpatient and at discharge.   6. GERD- Stable, continued PPI while inpatient and at discharge.   7. Depression- Stable, continued home Cymbalta while inpatient and at discharge.    Discharge Vitals:   BP 108/70  Pulse 84  Temp(Src) 97.5 F (36.4 C) (Oral)  Resp 18  Ht 6' (1.829 m)  Wt 187 lb 1.6 oz (84.868 kg)  BMI 25.37 kg/m2  SpO2 91%  Discharge Labs:  Results for orders placed during the hospital encounter of 05/18/13 (from the past 24 hour(s))  POCT I-STAT TROPONIN I     Status: None   Collection Time    05/18/13  2:49 PM      Result Value Range   Troponin i, poc 0.00  0.00 - 0.08 ng/mL   Comment 3           CG4 I-STAT (LACTIC ACID)     Status: None   Collection Time    05/18/13  2:50 PM      Result Value Range   Lactic Acid, Venous 1.17  0.5 - 2.2 mmol/L  URINALYSIS, ROUTINE W REFLEX MICROSCOPIC     Status: None   Collection Time    05/18/13  3:40 PM  Result Value Range   Color, Urine YELLOW  YELLOW   APPearance CLEAR  CLEAR   Specific Gravity, Urine 1.013  1.005 - 1.030   pH 7.5  5.0 - 8.0   Glucose, UA NEGATIVE  NEGATIVE mg/dL   Hgb urine dipstick NEGATIVE  NEGATIVE   Bilirubin Urine  NEGATIVE  NEGATIVE   Ketones, ur NEGATIVE  NEGATIVE mg/dL   Protein, ur NEGATIVE  NEGATIVE mg/dL   Urobilinogen, UA 1.0  0.0 - 1.0 mg/dL   Nitrite NEGATIVE  NEGATIVE   Leukocytes, UA NEGATIVE  NEGATIVE  BLOOD GAS, ARTERIAL     Status: Abnormal   Collection Time    05/18/13  4:02 PM      Result Value Range   O2 Content 2.0     Delivery systems NASAL CANNULA     pH, Arterial 7.398  7.350 - 7.450   pCO2 arterial 40.2  35.0 - 45.0 mmHg   pO2, Arterial 56.9 (*) 80.0 - 100.0 mmHg   Bicarbonate 24.3 (*) 20.0 - 24.0 mEq/L   TCO2 21.5  0 - 100 mmol/L   Acid-Base Excess 0.1  0.0 - 2.0 mmol/L   O2 Saturation 89.9     Patient temperature 98.6     Collection site RIGHT RADIAL     Drawn by [id number]     Sample type ARTERIAL DRAW     Allens test (pass/fail) PASS  PASS  INFLUENZA PANEL BY PCR (TYPE A & B, H1N1)     Status: None   Collection Time    05/18/13  9:46 PM      Result Value Range   Influenza A By PCR NEGATIVE  NEGATIVE   Influenza B By PCR NEGATIVE  NEGATIVE   H1N1 flu by pcr NOT DETECTED  NOT DETECTED  BASIC METABOLIC PANEL     Status: None   Collection Time    05/19/13  5:30 AM      Result Value Range   Sodium 145  137 - 147 mEq/L   Potassium 3.9  3.7 - 5.3 mEq/L   Chloride 109  96 - 112 mEq/L   CO2 25  19 - 32 mEq/L   Glucose, Bld 90  70 - 99 mg/dL   BUN 12  6 - 23 mg/dL   Creatinine, Ser 0.72  0.50 - 1.35 mg/dL   Calcium 8.6  8.4 - 10.5 mg/dL   GFR calc non Af Amer >90  >90 mL/min   GFR calc Af Amer >90  >90 mL/min  CBC     Status: Abnormal   Collection Time    05/19/13  5:30 AM      Result Value Range   WBC 5.0  4.0 - 10.5 K/uL   RBC 3.85 (*) 4.22 - 5.81 MIL/uL   Hemoglobin 12.5 (*) 13.0 - 17.0 g/dL   HCT 38.1 (*) 39.0 - 52.0 %   MCV 99.0  78.0 - 100.0 fL   MCH 32.5  26.0 - 34.0 pg   MCHC 32.8  30.0 - 36.0 g/dL   RDW 14.3  11.5 - 15.5 %   Platelets 223  150 - 400 K/uL  BLOOD GAS, ARTERIAL     Status: Abnormal   Collection Time    05/19/13  7:00 AM      Result  Value Range   O2 Content 2.5     Delivery systems NASAL CANNULA     pH, Arterial 7.405  7.350 - 7.450   pCO2 arterial 43.4  35.0 -  45.0 mmHg   pO2, Arterial 70.0 (*) 80.0 - 100.0 mmHg   Bicarbonate 26.6 (*) 20.0 - 24.0 mEq/L   TCO2 28.0  0 - 100 mmol/L   Acid-Base Excess 2.3 (*) 0.0 - 2.0 mmol/L   O2 Saturation 94.8     Patient temperature 98.6     Collection site RIGHT RADIAL     Drawn by [phone]     Sample type ARTERIAL DRAW     Allens test (pass/fail) PASS  PASS    Signed: Ivin Poot, MD 05/19/2013, 2:38 PM   Time Spent on Discharge: 60 minutes Services Ordered on Discharge: SNF Equipment Ordered on Discharge: none

## 2013-05-19 NOTE — Evaluation (Signed)
Physical Therapy Evaluation Patient Details Name: Todd [name] MRN: [id number] DOB: June 24, 1945 Today's Date: 05/19/2013 Time: 0932-3557 PT Time Calculation (min): 19 min  PT Assessment / Plan / Recommendation History of Present Illness  Todd [name] is a 68 y.o. 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 During his first admission, he was admitted and treated for aspiration PNA. He was discharged on 05/05/12 with augmentin, which he apparently completed. However, the patient returned to the ED with increased SOB and cough. During his second admission, the patient appeared to have continued aspiration as outpatient. This is expected given the patient's known severe oral phase dysphagia.  Patient was started on vancomycin and Zosyn IV in ED during the second admission for HCAP coverage. Abx were discontinued at discharge as acute infection appeared unlikely. Of note, the patient's wife (POA) refused PEG tube. She was counseled thoroughly at last admission on the risks of patient continuing to eat. He was discharged on a dysphagia 1 diet with meds crushed in puree, but compliance is uncertain. The patient reports that he has continued to eat solid food on his own. This appears to be due to the fact that his wife works and cannot help him eat during the daytime.   Clinical Impression  Patient presents with problems listed below.  Will benefit from acute PT to maximize independence prior to discharge.  Recommend SNF at discharge for continued therapy.    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  Instructed patient to use rail - patient reaching for PT's hand.  Assist to raise trunk to sitting position.  Once upright, patient with good sitting balance.  Assist to bring LE's onto bed to return to supine. Transfers Overall transfer level: Needs assistance Equipment used: 1 person hand held assist Transfers: Sit to/from Stand Sit to Stand: Mod assist General transfer comment: Verbal cues for hand placement and technique.  PT sitting in front of patient, assisting at low back.  Assist to shift weight forward over feet, and rise to standing.  Patient with decreased control of transition due to ataxic LE movement.  Patient stood x2 for 60-90 seconds each working on upright posture/control.  Patient incontinent (condom off and on floor and bed wet as PT entered room).  Returned to supine to be cleaned.    Exercises     PT Diagnosis: Difficulty walking;Generalized weakness;Altered mental status  PT Problem List: Decreased strength;Decreased activity tolerance;Decreased balance;Decreased mobility;Decreased coordination;Decreased cognition;Decreased knowledge of use of DME;Cardiopulmonary status limiting activity;Impaired tone;Decreased skin integrity PT Treatment Interventions: DME instruction;Gait training;Functional mobility training;Balance training;Neuromuscular re-education;Cognitive remediation;Patient/family education     PT Goals(Current goals can  be found in the care plan section) Acute Rehab PT Goals Patient Stated Goal: None stated PT Goal Formulation: With  patient Time For Goal Achievement: 06/02/13 Potential to Achieve Goals: Good  Visit Information  Last PT Received On: 05/19/13 Assistance Needed: +2 History of Present Illness: Todd [name] is a 68 y.o. 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 During his first admission, he was admitted and treated for aspiration PNA. He was discharged on 05/05/12 with augmentin, which he apparently completed. However, the patient returned to the ED with increased SOB and cough. During his second admission, the patient appeared to have continued aspiration as outpatient. This is expected given the patient's known severe oral phase dysphagia.  Patient was started on vancomycin and Zosyn IV in ED during the second admission for HCAP coverage. Abx were discontinued at discharge as acute infection appeared unlikely. Of note, the patient's wife (POA) refused PEG tube. She was counseled thoroughly at last admission on the risks of patient continuing to eat. He was discharged on a dysphagia 1 diet with meds crushed in puree, but compliance is uncertain. The patient reports that he has continued to eat solid food on his own. This appears to be due to the fact that his wife works and cannot help him eat during the daytime.        Prior Functioning  Home Living Family/patient expects to be discharged to:: [place]: Spouse/significant other Home Equipment: Walker - 2 wheels;Grab bars - toilet;Grab bars - tub/shower;Wheelchair - manual Prior Function Level of Independence: Needs assistance Gait / Transfers Assistance Needed: Assist with ambulation with RW ADL's / Homemaking Assistance Needed: Assist with bathing/dressing, meals Communication / Swallowing Assistance Needed: limited verbal communication Communication Communication: Expressive difficulties Dominant Hand: Right     Cognition  Cognition Arousal/Alertness: Awake/alert Behavior During Therapy: WFL for tasks assessed/performed Overall Cognitive Status: No family/caregiver present to determine baseline cognitive functioning (History of cognitive impairments)    Extremity/Trunk Assessment Upper Extremity Assessment Upper Extremity Assessment: Generalized weakness (Noted increased tone and ataxic movements) Lower Extremity Assessment Lower Extremity Assessment: Generalized weakness (Ataxic movements with increased tone)   Balance Balance Overall balance assessment: Needs assistance Standing balance support: Bilateral upper extremity supported Standing balance-Leahy Scale: Poor  End of Session PT - End of Session Equipment Utilized During Treatment: Gait belt Activity Tolerance: Patient limited by fatigue Patient left: in bed;with call bell/phone within reach Nurse Communication: Mobility status (Incontinent - RN and tech notified)  GP     Despina Pole 05/19/2013, 1:30 PM Carita Pian. Sanjuana Kava, [place] Pager [phone]

## 2013-05-19 NOTE — Discharge Summary (Signed)
  Date: 05/19/2013  Patient name: Todd [name]  Medical record number: [id number]  Date of birth: 1945/11/06   This patient has been seen and the plan of care was discussed with the house staff. Please see their note for complete details. I concur with their findings with the following additions/corrections: See my admission note.  Dominic Pea, DO, [facility] 05/19/2013, 5:23 PM

## 2013-05-19 NOTE — Discharge Instructions (Signed)
Please take all of your medicines as prescribed.  Be sure you abide my the diet below to prevent further aspiration pneumonia.    Dysphagia Diet with Thickened Liquids When you are diagnosed with difficulty swallowing (dysphagia), your health care provider may recommend a texture-modified diet. This diet may involve you thickening your liquids to make them easier and safer to swallow. This makes them less likely to enter into the lungs and cause aspiration pneumonia. Aspiration pneumonia is when your lungs become inflamed from breathing in foreign material.  You may have to thicken your liquids using commercial thickeners or purchase prethickened products. Please follow product guidelines for thickening liquids to the recommended consistency.  There are four standard consistencies of liquids that are recognized by the National Dysphagia Diet:  Thin.   Nectar-like.  Honey-like.  Spoon-thick. THIN LIQUIDS Thin liquids require no additional preparation. They include:  Water.  Juice.   Tea.  Coffee.  Milk.   Anything that can melt into a thin liquid such as gelatin dessert and ice cream.   Broth. NECTAR THICK Nectar-thick liquids fall slowly from a spoon and resemble the thickness of peach nectar. Some foods are naturally nectar thick such as buttermilk, eggnog, milk shakes, tomato juice, supplements, and cream soups. Any other liquids will need to be pre-thickened or thickened with a commercial thickener to a nectar-thick consistency. Please follow product guidelines. HONEY THICK Honey-thick liquids can fall from a spoon but are too thick to sip from a straw. They resemble the thickness of honey. Tomato sauce would be another naturally occurring product that is honey thick. Any other liquids will need to be prethickened or thickened with a commercial thickener to a honey-thick consistency. Please follow product guidelines.  SPOON THICK Spoon-thick liquids maintain their shape and  need to be taken with a spoon. Pudding is an example of a spoon-thick liquid. Any other liquids consumed would need to be thickened with a commercial thickener to a spoon-thick consistency. Please follow product guidelines.  Document Released: 10/13/2011 Document Revised: 12/14/2012 Document Reviewed: 09/20/2012 [facility] Patient Information 2014 [place].  Aspiration Precautions Aspiration is the inhaling of a liquid or object into the lungs. Things that can be inhaled into the lungs include:  Food.  Any type of liquid, such as drinks or saliva.  Stomach contents, such as vomit or stomach acid. When these things go into the lungs, damage can occur. Serious complications can then result, such as:  A lung infection (pneumonia).  A collection of pus in the lungs (lung abscess). CAUSES  A decreased level of awareness (consciousness) due to:  Traumatic brain injury or head injury.  Stroke.  Neurological disease.  Seizures.  Decreased or absent gag reflex (inability to cough).  Medical conditions that affect swallowing.  Conditions that affect the food pipe (esophagus) such as a narrowing of the esophagus (esophageal stricture).  Gastroesophageal reflux (GERD). This is also known as acid reflux.  Any type of surgery where you are put under general anesthesia or have sedation.  Drinking large amounts of alcohol.  Taking medication that causes drowsiness, confusion, or weakness.  Aging.  Dental problems.  Having a feeding tube. SYMPTOMS When aspiration occurs, different signs and symptoms can occur, such as:  Coughing (if a person has a cough or gag reflex) after swallowing food or liquids.  Difficulty breathing. This can include things like:  Breathing rapidly.  Breathing very slowly.  Hearing "gurgling" lung sounds when a person breaths.  Coughing up phlegm (sputum) that  is:  Yellow, tan, or green in color.  Has pieces of food in it.  Bad  smelling.  A change in voice (hoarseness).  A change in skin color. The skin may turn a "bluish" type color because of a lack of oxygen (cyanosis).  Fever. DIAGNOSIS  A chest X-ray may be performed. This takes a picture of your lungs. It can show changes in the lungs if aspiration has occurred.  A bronchoscopy may be performed. This is a surgical procedure in which a thin, flexible tube with a camera at the end is inserted into the nose or mouth. The tube is advanced to the lungs so your caregiver can view the lungs and obtain a culture, tissue sample, or remove an aspirated object.  A swallowing evaluation study may be performed to evaluate:  A person's risk of aspiration.  How difficult it is for a person to swallow.  What types of foods are safe for a person to eat. PREVENTION If you are a caregiver to someone who may aspirate, follow the directions below. If you are caring for someone who can eat and drink through their mouth:  Have them sit in an upright position when eating food or drinking fluids, such as:  Sitting up in a chair.  If sitting in a chair is not possible, position the person in bed so they are upright.  Remind the person to eat slowly and chew well.  Do not distract the person. This is especially important for people with thinking or memory (cognitive) problems.  Check the person's mouth for leftover food after eating.  Keep the person sitting upright for 30 to 45 minutes after eating.  Do not serve food or drink for at least 2 hours before bedtime. If you are caring for someone with a feeding tube and he or she cannot eat or drink through their mouth:  Keep the person in an upright position as much as possible.  Do not  lay the person flat if they are getting continuous feedings. Turn the feeding pump off if you need to lay the person flat for any reason.  Check feeding tube residuals as directed by your caregiver. If a large amount of tube feedings  are pulled back (aspirated) from the feeding tube, call your caregiver right away. General guidelines to prevent aspiration include:  Feed small amounts of food. Do not force feed.  Use as little water as possible when brushing the person's teeth or cleaning his or her mouth.  Provide oral care before and after meals.  Never put food or fluids in the mouth of a person who is not fully alert.  Crush pills and put them in soft food such as pudding or ice cream. Some pills should not be crushed. Check with your caregiver before crushing any medication. SEEK IMMEDIATE MEDICAL CARE IF:   The person has trouble breathing or starts to breathe rapidly.  The person is breathing very slowly or stops breathing.  The person coughs a lot after eating or drinking.  The person has a chronic cough.  The person coughs up thick, yellow, or tan sputum.  The person has a fever or persistent symptoms for more than 72 hours.  The person has a fever and their symptoms suddenly get worse. Document Released: 05/16/2010 Document Revised: 07/06/2011 Document Reviewed: 05/16/2010 [facility] Patient Information 2014 [place], [place].

## 2013-05-19 NOTE — Progress Notes (Addendum)
  Date: 05/19/2013  Patient name: Todd [name]  Medical record number: [id number]  Date of birth: [birth year]   Re-evaluated patient. Awake in bed, NAD. I personally checked O2 saturation and he is 95% on Room Air. He has no further evidence of hypoxia and not requiring oxygen at this time. He is in no respiratory distress.  Pending SNF placement.  Dominic Pea, DO, [facility] 05/19/2013, 4:41 PM

## 2013-05-19 NOTE — Progress Notes (Signed)
Patient was discharged to SNF  by MD order; discharged instructions and   paper work sent to SNF with patient belongings ; IV D/C; skin intact with redness to perineum area and yeasty. Patient  escorted by  EMS  via stretcher. Report called to nurse at [facility].

## 2013-05-19 NOTE — Clinical Social Work Note (Signed)
CSW attempted to contact patient's wife. Phone rang for about 3 minutes and CSW was unable to leave message. Per MD patient needs SNF as he has had multiple readmissions in the past month. CSW will complete FL2 and prepare patient's information for fax out to [facility]. CSW must speak with wife before patient's info can be faxed and before patient can be sent to SNF.   [name], [place], [place], [phone]

## 2013-05-19 NOTE — ED Provider Notes (Signed)
I saw and evaluated the patient, reviewed the resident's note and I agree with the findings and plan.  EKG Interpretation    Date/Time:  Monday May 15 2013 12:48:38 EST Ventricular Rate:  98 PR Interval:  193 QRS Duration: 90 QT Interval:  347 QTC Calculation: 443 R Axis:   11 Text Interpretation:  Sinus rhythm Abnormal R-wave progression, early transition Baseline wander in lead(s) III aVF V3 Confirmed by Zenia Resides  MD, Johan Antonacci ([id number]) on 05/15/2013 12:57:35 PM             Leota Jacobsen, MD 05/19/13 [phone]

## 2013-05-19 NOTE — H&P (Signed)
INTERNAL MEDICINE TEACHING SERVICE Attending Admission Note  Date: 05/19/2013  Patient name: Todd [name]  Medical record number: [id number]  Date of birth: 06-07-1945    I have seen and evaluated [name] and discussed their care with the Residency Team.  68 yr old male with a pmhx for TBI, chronic aspiration due to dysphagia, recent admissions for aspiration pneumonia/recurrent aspiration, presented due to cough. The patient has previously completed a course of augmentin for aspiration pneumonia as outpatient.  On previous admission, he was felt to have recurrent aspiration but no evidence of SIRS or sepsis or recurrent infection. His wife has stated in the past that she can not take care of him well but refused SNF placement.   Per report, adherence to dysphagia diet in uncertain. The patient states he has been eating solid food on his own.  He admits to some increased SOB. He was found to have evidence of acute hypoxic respiratory failure with a pO2 of 56 on 2 L of O2 per [place]. He has no fever, no leukocytosis. He has evidence of bibasilar pulmonary infiltrates, which are persistent. At this time, given his new O2 requirement, I would go ahead and treat with Clindamycin for 7 days. Please discuss with pharmacy if this can be crushed, if not, another choice would be moxifloxacin. We have approached his wife with her preference for a PEG tube (with extension to jejunum for feeds) but she has refused this. She understands that he will continue to aspirate chronically and all that can be done medically is reduce his risk of aspiration by positioning and diet modification. 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 There are satellite lesions. I would go ahead and treat this with nystatin topical three times daily for at least 3 weeks. He will need good skin care, as this is likely a result of urinary incontinence. He has a slightly productive cough, would have RT suction him to assist him  today. At this time, he is in NAD. Determine O2 requirement. He appears clinically stable and does not have SIRS/sepsis. As long as he does not continue to have evidence of worsening hypoxia on O2 requirement at this time, he can go to a SNF. He has not worsened from a respiratory standpoint from admission. Complete treatment with clindamycin as above. Unfortunately, his prognosis is poor given recurrent aspiration and we should expect continued repeat hospitalizations. We need to continue to discuss goals of care with his wife, as continued oral feeds will likely lead to recurrent hypoxic respiratory failure (she understands this, refuses PEG tube).  Dominic Pea, DO, [facility] 05/19/2013, 11:37 AM

## 2013-05-19 NOTE — Care Management Note (Signed)
    Page 1 of 1   05/19/2013     5:03:58 PM   CARE MANAGEMENT NOTE 05/19/2013  Patient:  SZYMON, FOILES   Account Number:  [id number]  Date Initiated:  05/19/2013  Documentation initiated by:  Tomi Bamberger  Subjective/Objective Assessment:   dx acute resp failure with hypoxia  admit- lives with spouse.     Action/Plan:   pt rec snf.   Anticipated DC Date:  05/19/2013   Anticipated DC Plan:  SKILLED NURSING FACILITY  In-house referral  Clinical Social Worker      DC Planning Services  CM consult      Choice offered to / List presented to:             Status of service:  Completed, signed off Medicare Important Message given?   (If response is "NO", the following Medicare IM given date fields will be blank) Date Medicare IM given:   Date Additional Medicare IM given:    Discharge Disposition:  [place]  Per UR Regulation:  Reviewed for med. necessity/level of care/duration of stay  If discussed at [place] of Stay Meetings, dates discussed:    Comments:

## 2013-05-19 NOTE — Consult Note (Signed)
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   Will re-order care for these sites.   At most recent admission pt did have candida overgrowth, if this is persistent and has not been treated with PO Diflucan may need this ordered per hospitalist.   Discussed POC with patient and bedside nurse.  Re consult if needed, will not follow at this time. Thanks  Alphonsa Brickle Kellogg, [place] [phone])

## 2013-05-19 NOTE — Progress Notes (Addendum)
Subjective: [name] is doing well this morning.  Gives "ok sign" when asked about his cough and breathing.  He is willing to go to nursing facility.  He was coughing some while eating his breakfast.   Objective: Vital signs in last 24 hours: Filed Vitals:   05/18/13 1644 05/18/13 1758 05/18/13 2146 05/19/13 0526  BP: 110/77 102/71 114/75 118/76  Pulse: 92 84 78 76  Temp: 98 F (36.7 C) 98.5 F (36.9 C) 97.4 F (36.3 C) 97.4 F (36.3 C)  TempSrc: Oral Oral Oral Oral  Resp: 18 20 20 20   Height:  6' (1.829 m)    Weight:  187 lb 1.6 oz (84.868 kg)    SpO2: 100% 93% 94% 98%   Weight change:   Intake/Output Summary (Last 24 hours) at 05/19/13 0817 Last data filed at 05/19/13 0526  Gross per 24 hour  Intake      0 ml  Output    200 ml  Net   -200 ml   PEX General: alert, cooperative, NAD HEENT: NCAT, stable right sided ptosis, vision grossly intact Neck: supple, no lymphadenopathy Lungs: bilateral rhonchi and end expiratory wheezing, normal work of respiration Heart: regular rate and rhythm, no murmurs, gallops, or rubs Abdomen: soft, non-tender, non-distended, normal bowel sounds Extremities: 2+ DP/PT pulses bilaterally, no cyanosis, clubbing, or edema Neurologic: alert & oriented X3, cranial nerves II-XII intact, strength grossly intact, sensation intact to light touch Skin: intergluteal cleft with worsened moisture associated skin breakdown, satellite lesions c/w candidal infection  Lab Results: Basic Metabolic Panel:  Recent Labs Lab 05/18/13 1400 05/19/13 0530  NA 139 145  K 4.3 3.9  CL 103 109  CO2 25 25  GLUCOSE 125* 90  BUN 10 12  CREATININE 0.73 0.72  CALCIUM 9.0 8.6   Liver Function Tests:  Recent Labs Lab 05/16/13 0735  AST 26  ALT 11  ALKPHOS 50  BILITOT 0.8  PROT 6.4  ALBUMIN 3.0*   CBC:  Recent Labs Lab 05/16/13 0735 05/18/13 1400 05/19/13 0530  WBC 7.5 7.5 5.0  NEUTROABS 5.8 5.9  --   HGB 12.7* 13.4 12.5*  HCT 37.9* 41.0 38.1*    MCV 99.2 98.1 99.0  PLT 246 253 223   Cardiac Enzymes:  Recent Labs Lab 05/15/13 1418  TROPONINI <0.30   BNP:  Recent Labs Lab 05/15/13 1418  PROBNP 185.4*   Urinalysis:  Recent Labs Lab 05/15/13 1530 05/18/13 1540  COLORURINE AMBER* YELLOW  LABSPEC 1.024 1.013  PHURINE 6.0 7.5  GLUCOSEU NEGATIVE NEGATIVE  HGBUR NEGATIVE NEGATIVE  BILIRUBINUR NEGATIVE NEGATIVE  KETONESUR NEGATIVE NEGATIVE  PROTEINUR NEGATIVE NEGATIVE  UROBILINOGEN 1.0 1.0  NITRITE NEGATIVE NEGATIVE  LEUKOCYTESUR SMALL* NEGATIVE    Micro Results: Recent Results (from the past 240 hour(s))  CULTURE, BLOOD (ROUTINE X 2)     Status: None   Collection Time    05/15/13  2:20 PM      Result Value Range Status   Specimen Description BLOOD HAND RIGHT   Final   Special Requests     Final   Value: BOTTLES DRAWN AEROBIC AND ANAEROBIC 10CCBLUE 5CCRED   Culture  Setup Time     Final   Value: 05/15/2013 20:39     Performed at [facility]   Culture     Final   Value:        BLOOD CULTURE RECEIVED NO GROWTH TO DATE CULTURE WILL BE HELD FOR 5 DAYS BEFORE ISSUING A FINAL NEGATIVE REPORT  Performed at [facility]   Report Status PENDING   Incomplete  CULTURE, BLOOD (ROUTINE X 2)     Status: None   Collection Time    05/15/13  3:35 PM      Result Value Range Status   Specimen Description BLOOD HAND RIGHT   Final   Special Requests BOTTLES DRAWN AEROBIC ONLY 10CC   Final   Culture  Setup Time     Final   Value: 05/15/2013 20:39     Performed at [facility]   Culture     Final   Value:        BLOOD CULTURE RECEIVED NO GROWTH TO DATE CULTURE WILL BE HELD FOR 5 DAYS BEFORE ISSUING A FINAL NEGATIVE REPORT     Performed at [facility]   Report Status PENDING   Incomplete   Studies/Results: Dg Chest 2 View  05/18/2013   CLINICAL DATA:  Cough.  EXAM: CHEST  2 VIEW  COMPARISON:  CT 05/15/2013.  FINDINGS: Mild prominence of the mediastinum secondary to prominent  mediastinal fat as noted on prior recent CT. Hilar structures are normal. Large airways patent. Moderate bibasilar infiltrates again present. Similar findings noted on prior CT. Poor inspiration. Tiny right pleural effusion. No pneumothorax. Heart size stable. Normal pulmonary vascularity. No acute bony abnormality. Degenerative changes thoracic spine.  IMPRESSION: Poor inspiration with persistent bibasilar pulmonary infiltrates and tiny right pleural effusion.   Electronically Signed   By: Marcello Moores  Register   On: 05/18/2013 14:58   Medications: I have reviewed the patient's current medications. Scheduled Meds: . budesonide-formoterol  2 puff Inhalation BID  . clopidogrel  75 mg Oral q morning - 10a  . DULoxetine  60 mg Oral Daily  . guaiFENesin  600 mg Oral BID  . heparin  5,000 Units Subcutaneous Q8H  . hydrocortisone cream   Topical BID  . ipratropium  0.5 mg Nebulization Q6H  . levalbuterol  0.63 mg Nebulization Q6H  . loratadine  10 mg Oral Daily  . metoprolol tartrate  25 mg Oral q morning - 10a  . pantoprazole  40 mg Oral Daily  . piperacillin-tazobactam (ZOSYN)  IV  3.375 g Intravenous Q8H  . polyethylene glycol  17 g Oral Daily  . sodium chloride  3 mL Intravenous Q12H  . sodium chloride  3 mL Intravenous Q12H   Continuous Infusions: . sodium chloride     PRN Meds:.albuterol, food thickener, lidocaine, sodium chloride Assessment/Plan: #Acute Respiratory Failure with hypoxia, resolved- Patient presented with increased cough.  Did not meet SIRS criteria.  Troponin x 1 negative, lactate within normal limits.  CXR showed poor inspiration with persistent bibasilar pulmonary infiltrates.  His ARF with hypoxia on presentation likely due to chronic aspiration with possible acute event.  Repeat ABG this morning much improved- 7.4/43/70/26.  Patient has been discharged the past two admissions on dysphagia 1 diet with meds crushed in puree, but compliance is uncertain.  Patient reportedly  completed course of augmentin earlier this month, starting him on clindamycin 600 mg po TID x 7 days today.  (He received vancomycin, cefepime, clindamycin in ED, and Zosyn overnight).  Flu panel negative.  Per RN, patient now satting 91% on RA (98% on 2.5L).  - clindamycin 600 mg TID (will give IV abx while inpatient, PO at discharge to be sprinkled in applesauce) - Duonebs q6h prn, continue Symbicort - Pulmonary toilet, tracheal aspiration prn by RT  - Aspiration precautions, elevate head of bdd - PT  consult - social work consult, patient's wife now amenable to him going to SNF  #Chronic aspiration- see above.  Patient likely has continued aspiration in part due to noncompliance with dysphagia diet.  Modified barium swallow study on 05/02/13 showed severe oral phase dysphagia.  [name] wife is not interested in G-J tube, was thoroughly counseled on risks of continuing to let patient eat at first admission this month.  Today she states "if pureed foods are absolutely what we have to do, we'll do it."  -continue dysphagia 1 pudding thick diet -lidocaine solution swabs for patient comfort - incentive spirometry q2h  - Mucinex, Claritan   #Moisture-associated skin breakdown- on buttocks -wound care consult -zinc barrier cream -nystatin powder  #History of stroke- Stable, continue home Plavix  #CAD- Stable, continue home Metoprolol  #GERD- Stable, continue home PPI  #Depression- Stable, continue home Cymbalta   Dispo: Disposition is deferred at this time, awaiting improvement of current medical problems.  Anticipated discharge to SNF today.   The patient does have a current PCP Otho Bellows, MD) and does need an [facility] hospital follow-up appointment after discharge.   .Services Needed at time of discharge: Y = Yes, Blank = No PT:   OT:   RN:   Equipment:   Other:     LOS: 1 day   Ivin Poot, MD 05/19/2013, 8:17 AM

## 2013-05-19 NOTE — Progress Notes (Signed)
Patient was recently discharged from [facility] services after multiple attempts were made to engage the patients wife.  She did request LCSW services which have been concluded.  Patient is discharging to SNF.  [facility] will not engage at this time but will advise PCP office that he can be referred again as needed when and if his return to home is anticipated.  Of note, [facility] Care Management services does not replace or interfere with any services that are arranged by inpatient case management or social work.  For additional questions or referrals please contact Corliss Blacker BSN RN [facility] Liaison at [phone].

## 2013-05-20 NOTE — Clinical Social Work Placement (Signed)
Clinical Social Work Department CLINICAL SOCIAL WORK PLACEMENT NOTE 05/20/2013  Patient:  Todd [name], Todd [name]  Account Number:  [id number] [place] date:  05/18/2013  Clinical Social Worker:  Kemper Durie, [place]  Date/time:  05/19/2013 04:00 PM  Clinical Social Work is seeking post-discharge placement for this patient at the following level of care:   [place]   (*CSW will update this form in Epic as items are completed)   05/19/2013  Patient/family provided with [place] Department of Clinical Social Work's list of facilities offering this level of care within the geographic area requested by the patient (or if unable, by the patient's family).  05/19/2013  Patient/family informed of their freedom to choose among providers that offer the needed level of care, that participate in Medicare, Medicaid or managed care program needed by the patient, have an available bed and are willing to accept the patient.  05/19/2013  Patient/family informed of [facility]' ownership interest in [facility], as well as of the fact that they are under no obligation to receive care at this facility.  PASARR submitted to [facility] on  PASARR number received from [place] on   FL2 transmitted to all facilities in geographic area requested by pt/family on  05/19/2013 FL2 transmitted to all facilities within larger geographic area on   Patient informed that his/her managed care company has contracts with or will negotiate with  certain facilities, including the following:     Patient/family informed of bed offers received:  05/19/2013 Patient chooses bed at [facility] [place] Physician recommends and patient chooses bed at    Patient to be transferred to [facility] on  05/19/2013 Patient to be transferred to facility by Ambulance  The following physician request were entered in Epic:   Additional Comments: Per MD patient ready to DC to [facility]. RN  given number for report. DC packet on chart. EMS transport requested. CSW signing off.   [name], [place], [place], [phone]
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
## 2013-05-21 LAB — CULTURE, BLOOD (ROUTINE X 2)
Culture: NO GROWTH
Culture: NO GROWTH

## 2013-05-22 ENCOUNTER — Encounter: Payer: Self-pay | Admitting: Internal Medicine

## 2013-05-22 ENCOUNTER — Non-Acute Institutional Stay (SKILLED_NURSING_FACILITY): Payer: Medicare Other | Admitting: Internal Medicine

## 2013-05-22 DIAGNOSIS — I6350 Cerebral infarction due to unspecified occlusion or stenosis of unspecified cerebral artery: Secondary | ICD-10-CM

## 2013-05-22 DIAGNOSIS — R1319 Other dysphagia: Secondary | ICD-10-CM

## 2013-05-22 DIAGNOSIS — J690 Pneumonitis due to inhalation of food and vomit: Secondary | ICD-10-CM

## 2013-05-22 DIAGNOSIS — R5383 Other fatigue: Secondary | ICD-10-CM

## 2013-05-22 DIAGNOSIS — R531 Weakness: Secondary | ICD-10-CM

## 2013-05-22 DIAGNOSIS — IMO0002 Reserved for concepts with insufficient information to code with codable children: Secondary | ICD-10-CM

## 2013-05-22 DIAGNOSIS — F329 Major depressive disorder, single episode, unspecified: Secondary | ICD-10-CM

## 2013-05-22 DIAGNOSIS — I6990 Unspecified sequelae of unspecified cerebrovascular disease: Secondary | ICD-10-CM

## 2013-05-22 DIAGNOSIS — F3289 Other specified depressive episodes: Secondary | ICD-10-CM

## 2013-05-22 DIAGNOSIS — R5381 Other malaise: Secondary | ICD-10-CM

## 2013-05-22 DIAGNOSIS — K5900 Constipation, unspecified: Secondary | ICD-10-CM

## 2013-05-22 DIAGNOSIS — K219 Gastro-esophageal reflux disease without esophagitis: Secondary | ICD-10-CM
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
## 2013-05-24 LAB — CULTURE, BLOOD (ROUTINE X 2)
CULTURE: NO GROWTH
Culture: NO GROWTH

## 2013-05-25 ENCOUNTER — Other Ambulatory Visit: Payer: Self-pay | Admitting: *Deleted

## 2013-05-25 ENCOUNTER — Encounter: Payer: Medicare Other | Admitting: Internal Medicine

## 2013-05-25 ENCOUNTER — Encounter: Payer: Self-pay | Admitting: Internal Medicine

## 2013-05-25 DIAGNOSIS — G8929 Other chronic pain: Secondary | ICD-10-CM

## 2013-05-25 DIAGNOSIS — M549 Dorsalgia, unspecified: Secondary | ICD-10-CM

## 2013-05-25 DIAGNOSIS — Z87820 Personal history of traumatic brain injury: Secondary | ICD-10-CM

## 2013-05-25 MED ORDER — TRAMADOL HCL 50 MG PO TABS: ORAL_TABLET | ORAL | Status: DC

## 2013-06-06 ENCOUNTER — Non-Acute Institutional Stay (SKILLED_NURSING_FACILITY): Payer: Medicare Other | Admitting: Internal Medicine

## 2013-06-06 ENCOUNTER — Encounter: Payer: Self-pay | Admitting: Internal Medicine

## 2013-06-06 DIAGNOSIS — I69991 Dysphagia following unspecified cerebrovascular disease: Secondary | ICD-10-CM

## 2013-06-06 DIAGNOSIS — Z87820 Personal history of traumatic brain injury: Secondary | ICD-10-CM

## 2013-06-06 DIAGNOSIS — I6990 Unspecified sequelae of unspecified cerebrovascular disease: Secondary | ICD-10-CM

## 2013-06-06 DIAGNOSIS — J690 Pneumonitis due to inhalation of food and vomit: Secondary | ICD-10-CM
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
## 2013-06-22 ENCOUNTER — Encounter: Payer: Medicare Other | Admitting: Internal Medicine

## 2013-06-27 ENCOUNTER — Telehealth: Payer: Self-pay | Admitting: *Deleted

## 2013-06-27 NOTE — Telephone Encounter (Signed)
Received a call from [facility], [facility] staff @ [facility] [phone] ex [phone]  Calling for paperwork for bed side commode. We have not received so they will refax.

## 2013-07-28 ENCOUNTER — Emergency Department (HOSPITAL_COMMUNITY): Payer: Medicare Other

## 2013-07-28 ENCOUNTER — Emergency Department (HOSPITAL_COMMUNITY)
Admission: EM | Admit: 2013-07-28 | Discharge: 2013-07-28 | Disposition: A | Discharge status: Home / Self Care | Payer: Medicare Other | Attending: Emergency Medicine | Admitting: Emergency Medicine

## 2013-07-28 ENCOUNTER — Encounter (HOSPITAL_COMMUNITY): Payer: Self-pay | Admitting: Emergency Medicine

## 2013-07-28 DIAGNOSIS — S32009A Unspecified fracture of unspecified lumbar vertebra, initial encounter for closed fracture: Secondary | ICD-10-CM | POA: Insufficient documentation

## 2013-07-28 DIAGNOSIS — Z87820 Personal history of traumatic brain injury: Secondary | ICD-10-CM | POA: Insufficient documentation

## 2013-07-28 DIAGNOSIS — Z7902 Long term (current) use of antithrombotics/antiplatelets: Secondary | ICD-10-CM | POA: Insufficient documentation

## 2013-07-28 DIAGNOSIS — Y929 Unspecified place or not applicable: Secondary | ICD-10-CM | POA: Insufficient documentation

## 2013-07-28 DIAGNOSIS — W06XXXA Fall from bed, initial encounter: Secondary | ICD-10-CM | POA: Insufficient documentation

## 2013-07-28 DIAGNOSIS — Z8709 Personal history of other diseases of the respiratory system: Secondary | ICD-10-CM | POA: Insufficient documentation

## 2013-07-28 DIAGNOSIS — IMO0002 Reserved for concepts with insufficient information to code with codable children: Secondary | ICD-10-CM | POA: Insufficient documentation

## 2013-07-28 DIAGNOSIS — Z79899 Other long term (current) drug therapy: Secondary | ICD-10-CM | POA: Insufficient documentation

## 2013-07-28 DIAGNOSIS — Z8701 Personal history of pneumonia (recurrent): Secondary | ICD-10-CM | POA: Insufficient documentation

## 2013-07-28 DIAGNOSIS — Z9889 Other specified postprocedural states: Secondary | ICD-10-CM | POA: Insufficient documentation

## 2013-07-28 DIAGNOSIS — S32000A Wedge compression fracture of unspecified lumbar vertebra, initial encounter for closed fracture: Secondary | ICD-10-CM

## 2013-07-28 DIAGNOSIS — K219 Gastro-esophageal reflux disease without esophagitis: Secondary | ICD-10-CM | POA: Insufficient documentation

## 2013-07-28 DIAGNOSIS — Z8711 Personal history of peptic ulcer disease: Secondary | ICD-10-CM | POA: Insufficient documentation

## 2013-07-28 DIAGNOSIS — Z85828 Personal history of other malignant neoplasm of skin: Secondary | ICD-10-CM | POA: Insufficient documentation

## 2013-07-28 DIAGNOSIS — Z8673 Personal history of transient ischemic attack (TIA), and cerebral infarction without residual deficits: Secondary | ICD-10-CM | POA: Insufficient documentation

## 2013-07-28 DIAGNOSIS — Z8669 Personal history of other diseases of the nervous system and sense organs: Secondary | ICD-10-CM | POA: Insufficient documentation

## 2013-07-28 DIAGNOSIS — Z8619 Personal history of other infectious and parasitic diseases: Secondary | ICD-10-CM | POA: Insufficient documentation

## 2013-07-28 DIAGNOSIS — Y9389 Activity, other specified: Secondary | ICD-10-CM | POA: Insufficient documentation

## 2013-07-28 MED ORDER — OXYCODONE HCL 5 MG PO TABS: 5.0000 mg | ORAL_TABLET | Freq: Four times a day (QID) | ORAL | Status: DC | PRN

## 2013-07-28 MED ORDER — OXYCODONE HCL 5 MG PO TABS
10.0000 mg | ORAL_TABLET | Freq: Once | ORAL | Status: AC
  Administered 2013-07-28: 10 mg via ORAL
  Filled 2013-07-28: qty 2

## 2013-07-28 MED ORDER — HYDROMORPHONE HCL PF 1 MG/ML IJ SOLN
1.0000 mg | Freq: Once | INTRAMUSCULAR | Status: AC
  Administered 2013-07-28: 1 mg via INTRAMUSCULAR
  Filled 2013-07-28: qty 1

## 2013-07-28 NOTE — Discharge Instructions (Signed)
Back, Compression Fracture °A compression fracture happens when a force is put upon the length of your spine. Slipping and falling on your bottom are examples of such a force. When this happens, sometimes the force is great enough to compress the building blocks (vertebral bodies) of your spine. Although this causes a lot of pain, this can usually be treated at home, unless your caregiver feels hospitalization is needed for pain control. °Your backbone (spinal column) is made up of 24 main vertebral bodies in addition to the sacrum and coccyx (see illustration). These are held together by tough fibrous tissues (ligaments) and by support of your muscles. Nerve roots pass through the openings between the vertebrae. A sudden wrenching move, injury, or a fall may cause a compression fracture of one of the vertebral bodies. This may result in back pain or spread of pain into the belly (abdomen), the buttocks, and down the leg into the foot. Pain may also be created by muscle spasm alone. °Large studies have been undertaken to determine the best possible course of action to help your back following injury and also to prevent future problems. The recommendations are as follows. °FOLLOWING A COMPRESSION FRACTURE: °Do the following only if advised by your caregiver.  °· If a back brace has been suggested or provided, wear it as directed. °· DO NOT stop wearing the back brace unless instructed by your caregiver. °· When allowed to return to regular activities, avoid a sedentary life style. Actively exercise. Sporadic weekend binges of tennis, racquetball, water skiing, may actually aggravate or create problems, especially if you are not in condition for that activity. °· Avoid sports requiring sudden body movements until you are in condition for them. Swimming and walking are safer activities. °· Maintain good posture. °· Avoid obesity. °· If not already done, you should have a DEXA scan. Based on the results, be treated for  osteoporosis. °FOLLOWING ACUTE (SUDDEN) INJURY: °· Only take over-the-counter or prescription medicines for pain, discomfort, or fever as directed by your caregiver. °· Use bed rest for only the most extreme acute episode. Prolonged bed rest may aggravate your condition. Ice used for acute conditions is effective. Use a large plastic bag filled with ice. Wrap it in a towel. This also provides excellent pain relief. This may be continuous. Or use it for 30 minutes every 2 hours during acute phase, then as needed. Heat for 30 minutes prior to activities is helpful. °· As soon as the acute phase (the time when your back is too painful for you to do normal activities) is over, it is important to resume normal activities and work hardening programs. Back injuries can cause potentially marked changes in lifestyle. So it is important to attack these problems aggressively. °· See your caregiver for continued problems. He or she can help or refer you for appropriate exercises, physical therapy and work hardening if needed. °· If you are given narcotic medications for your condition, for the next 24 hours DO NOT: °· Drive °· Operate machinery or power tools. °· Sign legal documents. °· DO NOT drink alcohol, take sleeping pills or other medications that may interfere with treatment. °If your caregiver has given you a follow-up appointment, it is very important to keep that appointment. Not keeping the appointment could result in a chronic or permanent injury, pain, and disability. If there is any problem keeping the appointment, you must call back to this facility for assistance.  °SEEK IMMEDIATE MEDICAL CARE IF: °· You develop numbness,   tingling, weakness, or problems with the use of your arms or legs. °· You develop severe back pain not relieved with medications. °· You have changes in bowel or bladder control. °· You have increasing pain in any areas of the body. °Document Released: 04/13/2005 Document Revised: 07/06/2011  Document Reviewed: 11/16/2007 °[employer]® Patient Information ©2014 [employer]. ° °
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
## 2013-07-28 NOTE — ED Notes (Signed)
Waiting for ptar 

## 2013-07-28 NOTE — ED Notes (Signed)
Bed: WA04 Expected date:  Expected time:  Means of arrival:  Comments: EMS-fall 

## 2013-07-29 ENCOUNTER — Encounter (HOSPITAL_COMMUNITY): Payer: Self-pay | Admitting: Emergency Medicine

## 2013-07-29 ENCOUNTER — Inpatient Hospital Stay (HOSPITAL_COMMUNITY)
Admission: EM | Admit: 2013-07-29 | Discharge: 2013-08-02 | DRG: 641 | Disposition: A | Discharge status: Home Health | Payer: Medicare Other | Attending: Internal Medicine | Admitting: Internal Medicine

## 2013-07-29 ENCOUNTER — Emergency Department (HOSPITAL_COMMUNITY): Payer: Medicare Other

## 2013-07-29 DIAGNOSIS — S32019A Unspecified fracture of first lumbar vertebra, initial encounter for closed fracture: Secondary | ICD-10-CM

## 2013-07-29 DIAGNOSIS — E860 Dehydration: Secondary | ICD-10-CM | POA: Diagnosis present

## 2013-07-29 DIAGNOSIS — R1311 Dysphagia, oral phase: Secondary | ICD-10-CM | POA: Diagnosis present

## 2013-07-29 DIAGNOSIS — S32009A Unspecified fracture of unspecified lumbar vertebra, initial encounter for closed fracture: Secondary | ICD-10-CM | POA: Diagnosis present

## 2013-07-29 DIAGNOSIS — E861 Hypovolemia: Principal | ICD-10-CM | POA: Diagnosis present

## 2013-07-29 DIAGNOSIS — R6510 Systemic inflammatory response syndrome (SIRS) of non-infectious origin without acute organ dysfunction: Secondary | ICD-10-CM | POA: Diagnosis present

## 2013-07-29 DIAGNOSIS — I69959 Hemiplegia and hemiparesis following unspecified cerebrovascular disease affecting unspecified side: Secondary | ICD-10-CM

## 2013-07-29 DIAGNOSIS — I69920 Aphasia following unspecified cerebrovascular disease: Secondary | ICD-10-CM

## 2013-07-29 DIAGNOSIS — I2510 Atherosclerotic heart disease of native coronary artery without angina pectoris: Secondary | ICD-10-CM | POA: Diagnosis present

## 2013-07-29 DIAGNOSIS — Z886 Allergy status to analgesic agent status: Secondary | ICD-10-CM

## 2013-07-29 DIAGNOSIS — E8779 Other fluid overload: Secondary | ICD-10-CM | POA: Diagnosis not present

## 2013-07-29 DIAGNOSIS — G8929 Other chronic pain: Secondary | ICD-10-CM | POA: Diagnosis present

## 2013-07-29 DIAGNOSIS — R5381 Other malaise: Secondary | ICD-10-CM | POA: Diagnosis present

## 2013-07-29 DIAGNOSIS — R269 Unspecified abnormalities of gait and mobility: Secondary | ICD-10-CM

## 2013-07-29 DIAGNOSIS — J4489 Other specified chronic obstructive pulmonary disease: Secondary | ICD-10-CM | POA: Diagnosis present

## 2013-07-29 DIAGNOSIS — W19XXXA Unspecified fall, initial encounter: Secondary | ICD-10-CM | POA: Diagnosis present

## 2013-07-29 DIAGNOSIS — M549 Dorsalgia, unspecified: Secondary | ICD-10-CM

## 2013-07-29 DIAGNOSIS — R0902 Hypoxemia: Secondary | ICD-10-CM

## 2013-07-29 DIAGNOSIS — Z87820 Personal history of traumatic brain injury: Secondary | ICD-10-CM

## 2013-07-29 DIAGNOSIS — R1310 Dysphagia, unspecified: Secondary | ICD-10-CM

## 2013-07-29 DIAGNOSIS — K219 Gastro-esophageal reflux disease without esophagitis: Secondary | ICD-10-CM | POA: Diagnosis present

## 2013-07-29 DIAGNOSIS — A419 Sepsis, unspecified organism: Secondary | ICD-10-CM

## 2013-07-29 DIAGNOSIS — R1313 Dysphagia, pharyngeal phase: Secondary | ICD-10-CM | POA: Diagnosis present

## 2013-07-29 DIAGNOSIS — J449 Chronic obstructive pulmonary disease, unspecified: Secondary | ICD-10-CM | POA: Diagnosis present

## 2013-07-29 DIAGNOSIS — K279 Peptic ulcer, site unspecified, unspecified as acute or chronic, without hemorrhage or perforation: Secondary | ICD-10-CM | POA: Diagnosis present

## 2013-07-29 DIAGNOSIS — J690 Pneumonitis due to inhalation of food and vomit: Secondary | ICD-10-CM

## 2013-07-29 DIAGNOSIS — B1920 Unspecified viral hepatitis C without hepatic coma: Secondary | ICD-10-CM | POA: Diagnosis present

## 2013-07-29 DIAGNOSIS — I498 Other specified cardiac arrhythmias: Secondary | ICD-10-CM | POA: Diagnosis present

## 2013-07-29 HISTORY — DX: Pneumonia, unspecified organism: J18.9

## 2013-07-29 HISTORY — DX: Unspecified fracture of first lumbar vertebra, initial encounter for closed fracture: S32.019A

## 2013-07-29 LAB — CBC WITH DIFFERENTIAL/PLATELET
Basophils Absolute: 0 10*3/uL (ref 0.0–0.1)
Basophils Relative: 0 % (ref 0–1)
Eosinophils Absolute: 0 10*3/uL (ref 0.0–0.7)
Eosinophils Relative: 0 % (ref 0–5)
HEMATOCRIT: 48.8 % (ref 39.0–52.0)
HEMOGLOBIN: 16.8 g/dL (ref 13.0–17.0)
LYMPHS ABS: 0.5 10*3/uL — AB (ref 0.7–4.0)
LYMPHS PCT: 3 % — AB (ref 12–46)
MCH: 34.2 pg — ABNORMAL HIGH (ref 26.0–34.0)
MCHC: 34.4 g/dL (ref 30.0–36.0)
MCV: 99.4 fL (ref 78.0–100.0)
MONO ABS: 0.9 10*3/uL (ref 0.1–1.0)
MONOS PCT: 5 % (ref 3–12)
NEUTROS PCT: 92 % — AB (ref 43–77)
Neutro Abs: 17.4 10*3/uL — ABNORMAL HIGH (ref 1.7–7.7)
Platelets: 213 10*3/uL (ref 150–400)
RBC: 4.91 MIL/uL (ref 4.22–5.81)
RDW: 13.3 % (ref 11.5–15.5)
WBC: 18.8 10*3/uL — ABNORMAL HIGH (ref 4.0–10.5)

## 2013-07-29 LAB — COMPREHENSIVE METABOLIC PANEL
ALBUMIN: 4 g/dL (ref 3.5–5.2)
ALK PHOS: 59 U/L (ref 39–117)
ALT: 15 U/L (ref 0–53)
AST: 32 U/L (ref 0–37)
BILIRUBIN TOTAL: 1.2 mg/dL (ref 0.3–1.2)
BUN: 23 mg/dL (ref 6–23)
CHLORIDE: 107 meq/L (ref 96–112)
CO2: 19 mEq/L (ref 19–32)
CREATININE: 0.87 mg/dL (ref 0.50–1.35)
Calcium: 9.4 mg/dL (ref 8.4–10.5)
GFR calc Af Amer: >90 mL/min (ref >90)
GFR calc non Af Amer: 89 mL/min — ABNORMAL LOW (ref >90)
GLUCOSE: 182 mg/dL — AB (ref 70–99)
POTASSIUM: 3.9 meq/L (ref 3.7–5.3)
Sodium: 142 mEq/L (ref 137–147)
Total Protein: 7.6 g/dL (ref 6.0–8.3)

## 2013-07-29 LAB — I-STAT CG4 LACTIC ACID, ED: Lactic Acid, Venous: 1.82 mmol/L (ref 0.5–2.2)

## 2013-07-29 LAB — URINALYSIS, ROUTINE W REFLEX MICROSCOPIC
Glucose, UA: NEGATIVE mg/dL
Hgb urine dipstick: NEGATIVE
Ketones, ur: NEGATIVE mg/dL
Leukocytes, UA: NEGATIVE
NITRITE: NEGATIVE
PH: 5.5 (ref 5.0–8.0)
PROTEIN: 30 mg/dL — AB
Specific Gravity, Urine: 1.036 — ABNORMAL HIGH (ref 1.005–1.030)
Urobilinogen, UA: 1 mg/dL (ref 0.0–1.0)

## 2013-07-29 LAB — URINE MICROSCOPIC-ADD ON

## 2013-07-29 MED ORDER — SODIUM CHLORIDE 0.9 % IV SOLN: INTRAVENOUS | Status: AC

## 2013-07-29 MED ORDER — VANCOMYCIN HCL 10 G IV SOLR
2000.0000 mg | Freq: Once | INTRAVENOUS | Status: AC
  Administered 2013-07-29: 2000 mg via INTRAVENOUS
  Filled 2013-07-29: qty 2000

## 2013-07-29 MED ORDER — IPRATROPIUM-ALBUTEROL 0.5-2.5 (3) MG/3ML IN SOLN
3.0000 mL | Freq: Four times a day (QID) | RESPIRATORY_TRACT | Status: DC | PRN
  Administered 2013-08-01: 3 mL via RESPIRATORY_TRACT
  Filled 2013-07-29: qty 3

## 2013-07-29 MED ORDER — GUAIFENESIN ER 600 MG PO TB12
600.0000 mg | ORAL_TABLET | Freq: Two times a day (BID) | ORAL | Status: DC
  Administered 2013-07-29 – 2013-08-02 (×6): 600 mg via ORAL
  Filled 2013-07-29 (×10): qty 1

## 2013-07-29 MED ORDER — ENOXAPARIN SODIUM 40 MG/0.4ML ~~LOC~~ SOLN
40.0000 mg | SUBCUTANEOUS | Status: DC
  Administered 2013-07-29 – 2013-08-01 (×4): 40 mg via SUBCUTANEOUS
  Filled 2013-07-29 (×5): qty 0.4

## 2013-07-29 MED ORDER — FENTANYL CITRATE 0.05 MG/ML IJ SOLN
100.0000 ug | Freq: Once | INTRAMUSCULAR | Status: AC
  Administered 2013-07-29: 100 ug via INTRAVENOUS
  Filled 2013-07-29: qty 2

## 2013-07-29 MED ORDER — PIPERACILLIN-TAZOBACTAM 3.375 G IVPB
3.3750 g | Freq: Three times a day (TID) | INTRAVENOUS | Status: DC
  Administered 2013-07-29 – 2013-07-30 (×2): 3.375 g via INTRAVENOUS
  Filled 2013-07-29 (×3): qty 50

## 2013-07-29 MED ORDER — ADULT MULTIVITAMIN W/MINERALS CH
1.0000 | ORAL_TABLET | Freq: Every morning | ORAL | Status: DC
  Administered 2013-07-30 – 2013-08-02 (×4): 1 via ORAL
  Filled 2013-07-29 (×4): qty 1

## 2013-07-29 MED ORDER — SODIUM CHLORIDE 0.9 % IV SOLN
INTRAVENOUS | Status: AC
  Administered 2013-07-30: 09:00:00 via INTRAVENOUS

## 2013-07-29 MED ORDER — SODIUM CHLORIDE 0.9 % IV SOLN
1000.0000 mL | Freq: Once | INTRAVENOUS | Status: AC
Start: 2013-07-29 — End: 2013-07-29
  Administered 2013-07-29: 1000 mL via INTRAVENOUS

## 2013-07-29 MED ORDER — TRAMADOL HCL 50 MG PO TABS
50.0000 mg | ORAL_TABLET | Freq: Four times a day (QID) | ORAL | Status: DC | PRN
  Administered 2013-07-30: 50 mg via ORAL
  Filled 2013-07-29: qty 1

## 2013-07-29 MED ORDER — BUDESONIDE-FORMOTEROL FUMARATE 80-4.5 MCG/ACT IN AERO
2.0000 | INHALATION_SPRAY | Freq: Two times a day (BID) | RESPIRATORY_TRACT | Status: DC
  Administered 2013-07-29 – 2013-08-02 (×8): 2 via RESPIRATORY_TRACT
  Filled 2013-07-29: qty 6.9

## 2013-07-29 MED ORDER — DULOXETINE HCL 60 MG PO CPEP
60.0000 mg | ORAL_CAPSULE | Freq: Every morning | ORAL | Status: DC
  Administered 2013-07-30 – 2013-08-02 (×4): 60 mg via ORAL
  Filled 2013-07-29 (×4): qty 1

## 2013-07-29 MED ORDER — VANCOMYCIN HCL IN DEXTROSE 1-5 GM/200ML-% IV SOLN: 1000.0000 mg | Freq: Once | INTRAVENOUS | Status: DC

## 2013-07-29 MED ORDER — FLUTICASONE PROPIONATE 50 MCG/ACT NA SUSP
2.0000 | Freq: Two times a day (BID) | NASAL | Status: DC
  Administered 2013-07-29 – 2013-08-02 (×7): 2 via NASAL
  Filled 2013-07-29: qty 16

## 2013-07-29 MED ORDER — SODIUM CHLORIDE 0.9 % IV SOLN
INTRAVENOUS | Status: AC
  Administered 2013-07-29: 20:00:00 via INTRAVENOUS

## 2013-07-29 MED ORDER — SODIUM CHLORIDE 0.9 % IV SOLN
1000.0000 mL | Freq: Once | INTRAVENOUS | Status: AC
  Administered 2013-07-29: 1000 mL via INTRAVENOUS

## 2013-07-29 MED ORDER — VANCOMYCIN HCL IN DEXTROSE 1-5 GM/200ML-% IV SOLN
1000.0000 mg | Freq: Three times a day (TID) | INTRAVENOUS | Status: DC
  Administered 2013-07-29 – 2013-07-30 (×2): 1000 mg via INTRAVENOUS
  Filled 2013-07-29 (×3): qty 200

## 2013-07-29 MED ORDER — DEXTROSE 5 % IV SOLN: 2.0000 g | Freq: Once | INTRAVENOUS | Status: DC

## 2013-07-29 MED ORDER — CLOPIDOGREL BISULFATE 75 MG PO TABS
75.0000 mg | ORAL_TABLET | Freq: Every morning | ORAL | Status: DC
  Administered 2013-07-30 – 2013-08-02 (×4): 75 mg via ORAL
  Filled 2013-07-29 (×4): qty 1

## 2013-07-29 MED ORDER — ALBUTEROL SULFATE HFA 108 (90 BASE) MCG/ACT IN AERS: 1.0000 | INHALATION_SPRAY | Freq: Four times a day (QID) | RESPIRATORY_TRACT | Status: DC | PRN

## 2013-07-29 MED ORDER — PIPERACILLIN-TAZOBACTAM 3.375 G IVPB 30 MIN
3.3750 g | INTRAVENOUS | Status: AC
  Administered 2013-07-29: 3.375 g via INTRAVENOUS
  Filled 2013-07-29: qty 50

## 2013-07-29 MED ORDER — SODIUM CHLORIDE 0.9 % IJ SOLN
3.0000 mL | Freq: Two times a day (BID) | INTRAMUSCULAR | Status: DC
  Administered 2013-07-29 – 2013-08-01 (×6): 3 mL via INTRAVENOUS

## 2013-07-29 MED ORDER — PANTOPRAZOLE SODIUM 40 MG PO TBEC
40.0000 mg | DELAYED_RELEASE_TABLET | Freq: Every day | ORAL | Status: DC
Start: 2013-07-30 — End: 2013-08-02
  Administered 2013-07-30 – 2013-08-02 (×4): 40 mg via ORAL
  Filled 2013-07-29 (×4): qty 1

## 2013-07-29 MED ORDER — ALBUTEROL SULFATE (2.5 MG/3ML) 0.083% IN NEBU: 2.5000 mg | INHALATION_SOLUTION | Freq: Four times a day (QID) | RESPIRATORY_TRACT | Status: DC | PRN

## 2013-07-29 MED ORDER — OXYCODONE HCL 5 MG PO TABS
5.0000 mg | ORAL_TABLET | Freq: Four times a day (QID) | ORAL | Status: DC | PRN
  Administered 2013-07-29: 10 mg via ORAL
  Filled 2013-07-29: qty 2

## 2013-07-29 MED ORDER — SODIUM CHLORIDE 0.9 % IV SOLN
1000.0000 mL | INTRAVENOUS | Status: DC
  Administered 2013-07-29 (×2): 1000 mL via INTRAVENOUS

## 2013-07-29 MED ORDER — RESOURCE THICKENUP CLEAR PO POWD
ORAL | Status: DC | PRN
  Filled 2013-07-29 (×2): qty 125

## 2013-07-29 MED ORDER — OXYCODONE HCL 5 MG PO TABS
5.0000 mg | ORAL_TABLET | Freq: Four times a day (QID) | ORAL | Status: DC | PRN
  Administered 2013-07-30 – 2013-08-02 (×5): 10 mg via ORAL
  Filled 2013-07-29 (×6): qty 2

## 2013-07-29 MED ORDER — METOPROLOL TARTRATE 25 MG PO TABS
25.0000 mg | ORAL_TABLET | Freq: Every morning | ORAL | Status: DC
  Administered 2013-07-30 – 2013-08-02 (×4): 25 mg via ORAL
  Filled 2013-07-29 (×4): qty 1

## 2013-07-29 NOTE — ED Notes (Addendum)
EMS were phone by his wife, who told them pt. [place] our of his recliner (slow fall).  The paramedic recognized him as someone she brought to [place] yesterday d/t fall and was dx with S1 fx and released home.  Today he is tachycardic, mildly short of breath with low rm. Air SPO2 (89% rm. Air).  He is in no distress.
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
## 2013-07-29 NOTE — ED Notes (Signed)
I just gave phone report to Dellis Filbert, RN with [facility] and they said they would be here for transport shortly.
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
## 2013-07-29 NOTE — ED Notes (Signed)
He remains awake, alert and in no distress. 

## 2013-07-29 NOTE — ED Notes (Signed)
He remains in no distress.  His wife remains with him.  They are aware of imminent transfer to [facility], with which they agree.

## 2013-07-29 NOTE — ED Notes (Signed)
I have just phoned report to Di Kindle, Therapist, sports at [facility]; and we have called [facility].  He remains in no distress.

## 2013-07-29 NOTE — ED Notes (Signed)
Per ems pt hx of TBI and organic brain disease, pt seen in ED yesterday for fall and T1 fracture, discharged home yesterday. In past he has had 6 back breaks including yesterdays. Today wfie was standing pt up to get him dressed. Pt had walker in front of him. Pt was in a motion recliner that was tilted forward, pt slid out of chair and onto floor. Pt complains of back pain from yesterday.  Yesterday pt upon arrival to ED was not diaphoretic or SOB as he is presently. On room air 89%, 94% on 6 L [place]. Wife reports pt was extremely diaphoretic before "fall/slide to floor". Per ems pt was still extremely diaphoretic upon their arrival.

## 2013-07-30 ENCOUNTER — Inpatient Hospital Stay (HOSPITAL_COMMUNITY): Payer: Medicare Other

## 2013-07-30 ENCOUNTER — Encounter (HOSPITAL_COMMUNITY): Payer: Self-pay | Admitting: *Deleted

## 2013-07-30 DIAGNOSIS — R4701 Aphasia: Secondary | ICD-10-CM

## 2013-07-30 DIAGNOSIS — R6510 Systemic inflammatory response syndrome (SIRS) of non-infectious origin without acute organ dysfunction: Secondary | ICD-10-CM

## 2013-07-30 DIAGNOSIS — Z8673 Personal history of transient ischemic attack (TIA), and cerebral infarction without residual deficits: Secondary | ICD-10-CM

## 2013-07-30 DIAGNOSIS — X58XXXA Exposure to other specified factors, initial encounter: Secondary | ICD-10-CM

## 2013-07-30 DIAGNOSIS — R4789 Other speech disturbances: Secondary | ICD-10-CM

## 2013-07-30 DIAGNOSIS — I2510 Atherosclerotic heart disease of native coronary artery without angina pectoris: Secondary | ICD-10-CM

## 2013-07-30 DIAGNOSIS — K279 Peptic ulcer, site unspecified, unspecified as acute or chronic, without hemorrhage or perforation: Secondary | ICD-10-CM

## 2013-07-30 DIAGNOSIS — J449 Chronic obstructive pulmonary disease, unspecified: Secondary | ICD-10-CM

## 2013-07-30 DIAGNOSIS — T148XXA Other injury of unspecified body region, initial encounter: Secondary | ICD-10-CM

## 2013-07-30 LAB — CBC WITH DIFFERENTIAL/PLATELET
BASOS PCT: 0 % (ref 0–1)
Basophils Absolute: 0 10*3/uL (ref 0.0–0.1)
Eosinophils Absolute: 0.2 10*3/uL (ref 0.0–0.7)
Eosinophils Relative: 2 % (ref 0–5)
HCT: 38.9 % — ABNORMAL LOW (ref 39.0–52.0)
HEMOGLOBIN: 12.9 g/dL — AB (ref 13.0–17.0)
Lymphocytes Relative: 11 % — ABNORMAL LOW (ref 12–46)
Lymphs Abs: 1.2 10*3/uL (ref 0.7–4.0)
MCH: 33.6 pg (ref 26.0–34.0)
MCHC: 33.2 g/dL (ref 30.0–36.0)
MCV: 101.3 fL — ABNORMAL HIGH (ref 78.0–100.0)
MONO ABS: 0.7 10*3/uL (ref 0.1–1.0)
MONOS PCT: 6 % (ref 3–12)
NEUTROS ABS: 9 10*3/uL — AB (ref 1.7–7.7)
Neutrophils Relative %: 81 % — ABNORMAL HIGH (ref 43–77)
Platelets: 163 10*3/uL (ref 150–400)
RBC: 3.84 MIL/uL — ABNORMAL LOW (ref 4.22–5.81)
RDW: 13.5 % (ref 11.5–15.5)
WBC: 11.1 10*3/uL — ABNORMAL HIGH (ref 4.0–10.5)

## 2013-07-30 LAB — URINE CULTURE
CULTURE: NO GROWTH
Colony Count: NO GROWTH

## 2013-07-30 LAB — MRSA PCR SCREENING: MRSA by PCR: NEGATIVE

## 2013-07-30 MED ORDER — LEVOFLOXACIN IN D5W 750 MG/150ML IV SOLN
750.0000 mg | INTRAVENOUS | Status: DC
  Administered 2013-07-30: 750 mg via INTRAVENOUS
  Filled 2013-07-30 (×2): qty 150

## 2013-07-30 MED ORDER — MORPHINE SULFATE 2 MG/ML IJ SOLN
2.0000 mg | INTRAMUSCULAR | Status: DC | PRN
  Administered 2013-07-30: 2 mg via INTRAVENOUS
  Filled 2013-07-30: qty 1

## 2013-07-30 NOTE — H&P (Signed)
INTERNAL MEDICINE TEACHING ATTENDING NOTE  Day 1 of stay  Patient name: Todd [name]  MRN: [id number] Date of birth: 1946/04/18   68 y.o.man with past medical history of traumatic brain injury, CVA, seizures, recurrent aspiration, pneumonia, presented to ED with shortness of breath. He has been in the ED  s/p fall and found to have L1 fracture. The patient is disabled due to TBI and CVA in the past. He seems to have comprehension, and answers simples questions adequately, however is limited by severe dysarthria. He says he can turn around in bed, sit with support and walk somewhat with a walker. However, he is still severely limited in performing his ADLs. He is taken care of by a spouse, who is working and has ailments of her own. About diet, he has been cleared for a dysphagia 1 diet (pudding thick), however, he seems to be getting chopped meals at home per my IM teams history (I have not personally interviewed the spouse as she was not present when I visited the patient). Currently, he denies any cough, or chest pain or palpitations. He is scared to take a deep breath secondary to his back pain due to the new vertebral fracture. His lungs have good breath sounds and soft crackles all over. There is no wheezing. He endorses back pain. Cardiovascular exam within normal limits.  I have reviewed his labs and imaging. I have reviewed Dr Laroy Apple note.   Shortness of breath - Could be secondary to aspiration, or could be acquiring a new pneumonia - HCAP vs CAP hard to say since he has been in SNF in January. The leukocytosis could be infectious or reactive secondary to recent fall. It is trending down. He received vancomycin and zocyn in ER, however I think it might be okay to change him to Levaquin. Also, after mild hydration, we should do another CXR. A component of shortness of breath could be due to shallow breathing fearing pain due to back fracture. Incentive spirometry will be ordered to help  with that.   I think we will start with this and follow the patient clinically.   Disposition - The patient's spouse, I am told is adamant to take him home, however she herself has her own problems and she works during day time. A fresh discussion about disposition might be needed before discharge.  I have seen and evaluated this patient and discussed it with my IM resident team.  Please see the rest of the plan per resident note from today.   [place], [place] 07/30/2013, 11:29 AM.
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
## 2013-07-30 NOTE — Progress Notes (Signed)
Pharmacy: Levaquin  67yom initially started on vancomycin and zosyn for r/o sepsis now to switch to levaquin for CAP vs HCAP vs aspiration PNA. Renal function stable.  4/4 Vancomycin >>4/5 4/4 Zosyn >> 4/5 4/5 Levaquin>>  4/4 blood x2>> 4/4 urine>>  Plan: 1) Levaquin 750mg  IV q24  Nena Jordan, PharmD, BCPS 07/30/2013, 7:34 AM

## 2013-07-30 NOTE — Evaluation (Signed)
Physical Therapy Evaluation Patient Details Name: Todd [name] MRN: [id number] DOB: [birth year] Today's Date: 07/30/2013   History of Present Illness  Todd [name] is a 68 y.o. man with a pmhx of recurrent aspiration PNA, TBI, CVA, seizures, who presented to the ED with a cc of SOB.  The was in his normal state of health until yesterday when he had a fall when he was left home alone while his spouse went to the store for "34 minutes". He was in a lot of pain and was taken to the ED. He was diagnosed with a new L1 fracture and d/c home. The patients spouse states that with his current pain, she was unable to help him to move. EMS was called on 4/4 and the patient was brought back to the [facility] ED. Patient was febrile leukocytosis, and tachycardic at that time.  Clinical Impression  Patient presents with problems listed below.  Will benefit from acute PT to maximize independence prior to discharge. Patient requires mod +2 assist for mobility for safety.  Do not feel patient is safe to be at home alone.  Recommend SNF at discharge for continued therapy.    Follow Up Recommendations SNF;Supervision/Assistance - 24 hour    Equipment Recommendations  None recommended by PT    Recommendations for Other Services       Precautions / Restrictions Precautions Precautions: Fall Restrictions Weight Bearing Restrictions: No      Mobility  Bed Mobility Overal bed mobility: Needs Assistance Bed Mobility: Supine to Sit     Supine to sit: Mod assist     General bed mobility comments: Verbal and visual cues for technique.  Patient having difficulty following commands to roll. Patient reaching for PT hand to move supine to sit.  Mod assist to bring trunk to upright position.  Once upright, patient able to maintain balance with min guard assist.  Transfers Overall transfer level: Needs assistance Equipment used: Rolling walker (2 wheeled) Transfers: Sit to/from [employer] Sit to Stand: Min assist;+2 physical assistance Stand pivot transfers: Mod assist;+2 physical assistance       General transfer comment: Verbal cues for hand placement and technique.  Assist to rise to standing and for balance.  Min assist required in standing to maintain balance.  Patient required assist to maneuver RW to pivot to chair.  Patient able to take several short steps to pivot, movement ataxic.  Ambulation/Gait                Stairs            Wheelchair Mobility    Modified Rankin (Stroke Patients Only)       Balance Overall balance assessment: Needs assistance Sitting-balance support: Single extremity supported;Feet supported Sitting balance-Leahy Scale: Fair     Standing balance support: Bilateral upper extremity supported Standing balance-Leahy Scale: Poor                               Pertinent Vitals/Pain     Home Living Family/patient expects to be discharged to:: Skilled nursing facility Living Arrangements: Spouse/significant other (Wife works - patient alone during day)             Home Equipment: Environmental consultant - 2 wheels;Grab bars - toilet;Grab bars - tub/shower;Wheelchair - manual      Prior Function Level of Independence: Needs assistance   Gait / Transfers Assistance Needed: Assist with ambulation with RW  ADL's / Homemaking Assistance Needed: Assist with bathing/dressing, meals        Hand Dominance   Dominant Hand: Right    Extremity/Trunk Assessment   Upper Extremity Assessment: Defer to OT evaluation           Lower Extremity Assessment: Generalized weakness (Note ataxic movements)         Communication   Communication: Expressive difficulties  Cognition Arousal/Alertness: Awake/alert Behavior During Therapy: WFL for tasks assessed/performed Overall Cognitive Status: History of cognitive impairments - at baseline                      General Comments      Exercises         Assessment/Plan    PT Assessment Patient needs continued PT services  PT Diagnosis Difficulty walking;Abnormality of gait;Generalized weakness;Acute pain;Altered mental status   PT Problem List Decreased strength;Decreased activity tolerance;Decreased balance;Decreased mobility;Decreased cognition;Decreased knowledge of use of DME;Cardiopulmonary status limiting activity;Pain  PT Treatment Interventions DME instruction;Gait training;Functional mobility training;Balance training;Patient/family education   PT Goals (Current goals can be found in the Care Plan section) Acute Rehab PT Goals Patient Stated Goal: None stated PT Goal Formulation: With patient Time For Goal Achievement: 08/06/13 Potential to Achieve Goals: Fair    Frequency Min 3X/week   Barriers to discharge Decreased caregiver support Patient is at home alone during day - wife works.    Co-evaluation               End of Session Equipment Utilized During Treatment: Gait belt;Oxygen Activity Tolerance: Patient limited by fatigue Patient left: in chair;with call bell/phone within reach Nurse Communication: Mobility status         Time: 7124-5809 PT Time Calculation (min): 16 min   Charges:   PT Evaluation $PT Re-evaluation: 1 Procedure PT Treatments $Therapeutic Activity: 8-22 mins   PT G Codes:          Despina Pole 07/30/2013, 3:23 PM Carita Pian. Sanjuana Kava, [place] Pager [phone]

## 2013-07-30 NOTE — Progress Notes (Signed)
Subjective: Patient c/o pain to his lower back this morning, but otherwise has no complaints. Denies CP, SOB, dysuria, BLE edema, dizziness, chills. When asked, patient admits to coughing frequently with eating.  Objective: Vital signs in last 24 hours: Filed Vitals:   07/30/13 0734 07/30/13 1012 07/30/13 1118 07/30/13 1119  BP: 103/60  105/70 105/72  Pulse: 60   76  Temp:    98.1 F (36.7 C)  TempSrc:    Oral  Resp: 23   20  Height:      Weight:      SpO2: 92% 99%  95%   Weight change:   Intake/Output Summary (Last 24 hours) at 07/30/13 1325 Last data filed at 07/30/13 1300  Gross per 24 hour  Intake 2714.58 ml  Output    400 ml  Net 2314.58 ml   Physical Exam General: alert, cooperative, and in no apparent distress; sitting upright with audible loud, gurgling breathing HEENT: pupils equal round and reactive to light, vision grossly intact, oropharynx clear and non-erythematous  Neck: supple, no JVD Lungs: loud transmitted upper airway breath sounds, lungs themselves sound clear though hard to tell, normal work of respiration; satting 92-95% r/a Heart: regular rate and rhythm, no murmurs, gallops, or rubs Abdomen: soft, non-tender, non-distended, normal bowel sounds Extremities: warm b/l, no BLE edema Neurologic: alert & oriented X3, dysarthric, sitting upright in bed and is able to pull himself up in chair with minimal assist    Lab Results: Basic Metabolic Panel:  Recent Labs Lab 07/29/13 0958  NA 142  K 3.9  CL 107  CO2 19  GLUCOSE 182*  BUN 23  CREATININE 0.87  CALCIUM 9.4   Liver Function Tests:  Recent Labs Lab 07/29/13 0958  AST 32  ALT 15  ALKPHOS 59  BILITOT 1.2  PROT 7.6  ALBUMIN 4.0   CBC:  Recent Labs Lab 07/29/13 0958 07/30/13 0250  WBC 18.8* 11.1*  NEUTROABS 17.4* 9.0*  HGB 16.8 12.9*  HCT 48.8 38.9*  MCV 99.4 101.3*  PLT 213 163   Urinalysis:  Recent Labs Lab 07/29/13 1049  COLORURINE AMBER*  LABSPEC 1.036*    PHURINE 5.5  GLUCOSEU NEGATIVE  HGBUR NEGATIVE  BILIRUBINUR MODERATE*  KETONESUR NEGATIVE  PROTEINUR 30*  UROBILINOGEN 1.0  NITRITE NEGATIVE  LEUKOCYTESUR NEGATIVE   Micro Results: Recent Results (from the past 240 hour(s))  CULTURE, BLOOD (ROUTINE X 2)     Status: None   Collection Time    07/29/13  9:55 AM      Result Value Ref Range Status   Specimen Description BLOOD LEFT ARM   Final   Special Requests BOTTLES DRAWN AEROBIC AND ANAEROBIC 5CC EACH   Final   Culture  Setup Time     Final   Value: 07/29/2013 14:52     Performed at [facility]   Culture     Final   Value:        BLOOD CULTURE RECEIVED NO GROWTH TO DATE CULTURE WILL BE HELD FOR 5 DAYS BEFORE ISSUING A FINAL NEGATIVE REPORT     Performed at [facility]   Report Status PENDING   Incomplete  CULTURE, BLOOD (ROUTINE X 2)     Status: None   Collection Time    07/29/13 10:15 AM      Result Value Ref Range Status   Specimen Description BLOOD RIGHT ANTECUBITAL   Final   Special Requests BOTTLES DRAWN AEROBIC AND ANAEROBIC 5 CC EACH  Final   Culture  Setup Time     Final   Value: 07/29/2013 14:52     Performed at [facility]   Culture     Final   Value:        BLOOD CULTURE RECEIVED NO GROWTH TO DATE CULTURE WILL BE HELD FOR 5 DAYS BEFORE ISSUING A FINAL NEGATIVE REPORT     Performed at [facility]   Report Status PENDING   Incomplete  MRSA PCR SCREENING     Status: None   Collection Time    07/30/13  6:12 AM      Result Value Ref Range Status   MRSA by PCR NEGATIVE  NEGATIVE Final   Comment:            The GeneXpert MRSA Assay (FDA     approved for NASAL specimens     only), is one component of a     comprehensive MRSA colonization     surveillance program. It is not     intended to diagnose MRSA     infection nor to guide or     monitor treatment for     MRSA infections.   Studies/Results: Dg Chest 2 View  07/30/2013   CLINICAL DATA:  Shortness of breath.  EXAM:  CHEST  2 VIEW  COMPARISON:  Chest radiograph performed 07/29/2013  FINDINGS: The lungs are well-aerated. Vascular congestion is noted. Increased interstitial markings on the lateral view raise concern for pulmonary edema. No pleural effusion or pneumothorax is seen.  The heart is normal in size; the mediastinal contour is within normal limits. No acute osseous abnormalities are seen.  IMPRESSION: Vascular congestion noted. Increased interstitial markings on the lateral view raise concern for mild pulmonary edema, though this is less well characterized on the frontal view.   Electronically Signed   By: Garald Balding M.D.   On: 07/30/2013 07:04   Dg Thoracic Spine 2 View  07/28/2013   CLINICAL DATA:  Fall.  Mid and upper back pain.  EXAM: THORACIC SPINE - 2 VIEW  COMPARISON:  DG CHEST 2 VIEW dated 05/18/2013; CT ANGIO CHEST W/CM &/OR WO/CM dated 05/15/2013  FINDINGS: Twelve rib-bearing thoracic type vertebral bodies are present. Mild chronic superior endplate fractures at T3 and T4 are stable. Degenerative changes are noted in the lower cervical spine. Rightward curvature of the mid thoracic spine is stable.  IMPRESSION: 1. No acute abnormality. 2. Stable rightward curvature of the thoracic spine. 3. Remote superior endplate fractures at T3 and T4.   Electronically Signed   By: Lawrence Santiago M.D.   On: 07/28/2013 13:42   Dg Lumbar Spine Complete  07/28/2013   CLINICAL DATA:  Fall.  Low back pain.  EXAM: LUMBAR SPINE - COMPLETE 4+ VIEW  COMPARISON:  DG LUMBAR SPINE COMPLETE W/BEND dated 04/09/2012; CT ANGIO CHEST W/CM &/OR WO/CM dated 05/15/2013; DG THORACIC SPINE dated 07/28/2013; DG CHEST 2 VIEW dated 05/18/2013  FINDINGS: Five non rib-bearing lumbar type vertebral bodies are present. Remote fractures at T12 and L3 are stable. Retrolisthesis at L3-4 is stable. The superior endplate fracture at L1 is new since January. There is no definite retropulsion of bone. Alignment is stable. Atherosclerotic calcifications are  again seen in the aorta. Moderate stool is present throughout the colon.  IMPRESSION: 1. New superior endplate fracture at L1 may be acute. 2. Stable remote fractures at T12 and L3. 3. Stable retrolisthesis at L3-4.   Electronically Signed   By: Gerald Stabs  Mattern M.D.   On: 07/28/2013 13:47   Ct Cervical Spine Wo Contrast  07/28/2013   CLINICAL DATA:  Pain post fall  EXAM: CT CERVICAL SPINE WITHOUT CONTRAST  TECHNIQUE: Multidetector CT imaging of the cervical spine was performed without intravenous contrast. Multiplanar CT image reconstructions were also generated.  COMPARISON:  10/18/2009  FINDINGS: Normal alignment. Mild narrowing of the C4-5 interspace, moderate narrowing C5-6 and C6-7 with anterior posterior endplate spurs. Facet and uncovertebral spurs result in foraminal encroachment right C4-5. No prevertebral soft tissue swelling. Facets are seated. Negative for fracture.  IMPRESSION: 1. Negative for fracture or other acute abnormality. 2. Spondylitic changes C4-C7 as above   Electronically Signed   By: Arne Cleveland M.D.   On: 07/28/2013 13:44   Dg Chest Port 1 View  07/29/2013   CLINICAL DATA:  Tachycardia  EXAM: PORTABLE CHEST - 1 VIEW  COMPARISON:  05/18/2013  FINDINGS: Cardiomediastinal silhouette is stable. No acute infiltrate or pleural effusion. No pulmonary edema. Bilateral basilar atelectasis.  IMPRESSION: No active disease.   Electronically Signed   By: Lahoma Crocker M.D.   On: 07/29/2013 10:21   Medications: I have reviewed the patient's current medications. Scheduled Meds: . budesonide-formoterol  2 puff Inhalation BID  . clopidogrel  75 mg Oral q morning - 10a  . DULoxetine  60 mg Oral q morning - 10a  . enoxaparin (LOVENOX) injection  40 mg Subcutaneous Q24H  . fluticasone  2 spray Each Nare BID  . guaiFENesin  600 mg Oral BID  . levofloxacin (LEVAQUIN) IV  750 mg Intravenous Q24H  . metoprolol tartrate  25 mg Oral q morning - 10a  . multivitamin with minerals  1 tablet Oral q  morning - 10a  . pantoprazole  40 mg Oral Daily  . sodium chloride  3 mL Intravenous Q12H   Continuous Infusions: . sodium chloride 1,000 mL (07/30/13 1300)   PRN Meds:.ipratropium-albuterol, oxyCODONE, RESOURCE THICKENUP CLEAR, traMADol  Assessment/Plan: SIRS: The patient has SIRS of an unknown source. Leukocytosis downtrending 18-->11. Tachycardia has resolved. He is afebrile overnight, though upon presentation to ED yesterday did have documented fever to 101.3 rectally. Perhaps patient was dehydrated and hemoconcentrated, which could possibly explain his elevated Hb and WBC count as well as the initial tachycardia. S/p 3L NS in ED and kept on NS at 1125cc/hr overnight. However, fever is definitely concerning for infective source. UA negative. Lactate wnl. As of now, patient has loud upper airway sounds, but is not hypoxic on room air. CXR shows mild pulmonary edema but no PNA. It is possible that after IVF patient's pneumonia may appear on CXR. Of note, patient not orthostatic from lying to sitting. -continue IVF 125cc/hr since NPO -repeat CXR at [place] [facility] today -levaquin per pharmacy for ?CAP vs HCAP (s/p vanc/zosyn in ED x 1 dose) day 2/? -incentive spirometry given patient likely not taking deep breaths 2/2 his back pain -f/u UCx -BCx NGTD -pulm toilet   L1 Compression Fracture: Conservative management with pain meds.  He is on tramadol and oxycodone IR for breakthrough at home.  -continue home medications and add morphine 2mg  IV q4h prn  - PT OT and SW for likely SNF placement for rehab  Dysphasia: Known aspiration with PO intake. Per chart review, patient and family had made the decision to keep feeding patient despite continued aspiration risk. He had been sent home to SNF on dysphagia 1, pudding thick diet. He has been treated for aspiration PNA 05/2013.  -NPO -SLP eval  Dysarthria: Appears to be at baseline.  COPD: Appears stable, continue home meds, Duonebs and symbicort.  CVA:  Appear stable. Continue Plavix .  CAD: Appears stable, continue Metoprolol 25mg  daily PUD: Appears stable, Continue home PPI  Dispo: Disposition is deferred at this time, awaiting improvement of current medical problems.  Anticipated discharge in approximately 1-2 day(s).   The patient does have a current PCP Otho Bellows, MD) and does need an [facility] hospital follow-up appointment after discharge.  The patient does not have transportation limitations that hinder transportation to clinic appointments.  .Services Needed at time of discharge: Y = Yes, Blank = No PT:   OT:   RN:   Equipment:   Other:     LOS: 1 day   Rebecca Eaton, MD 07/30/2013, 1:25 PM
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
## 2013-07-30 NOTE — Progress Notes (Signed)
Utilization review completed.  

## 2013-07-31 ENCOUNTER — Inpatient Hospital Stay (HOSPITAL_COMMUNITY): Payer: Medicare Other

## 2013-07-31 DIAGNOSIS — S32009A Unspecified fracture of unspecified lumbar vertebra, initial encounter for closed fracture: Secondary | ICD-10-CM

## 2013-07-31 DIAGNOSIS — R471 Dysarthria and anarthria: Secondary | ICD-10-CM

## 2013-07-31 LAB — CBC WITH DIFFERENTIAL/PLATELET
BASOS ABS: 0 10*3/uL (ref 0.0–0.1)
Basophils Relative: 0 % (ref 0–1)
EOS PCT: 4 % (ref 0–5)
Eosinophils Absolute: 0.3 10*3/uL (ref 0.0–0.7)
HCT: 38.9 % — ABNORMAL LOW (ref 39.0–52.0)
Hemoglobin: 13.1 g/dL (ref 13.0–17.0)
LYMPHS PCT: 18 % (ref 12–46)
Lymphs Abs: 1.3 10*3/uL (ref 0.7–4.0)
MCH: 33.5 pg (ref 26.0–34.0)
MCHC: 33.7 g/dL (ref 30.0–36.0)
MCV: 99.5 fL (ref 78.0–100.0)
MONO ABS: 0.6 10*3/uL (ref 0.1–1.0)
Monocytes Relative: 9 % (ref 3–12)
NEUTROS ABS: 5.1 10*3/uL (ref 1.7–7.7)
Neutrophils Relative %: 69 % (ref 43–77)
PLATELETS: 171 10*3/uL (ref 150–400)
RBC: 3.91 MIL/uL — ABNORMAL LOW (ref 4.22–5.81)
RDW: 13 % (ref 11.5–15.5)
WBC: 7.3 10*3/uL (ref 4.0–10.5)

## 2013-07-31 MED ORDER — FUROSEMIDE 10 MG/ML IJ SOLN: 20.0000 mg | Freq: Once | INTRAMUSCULAR | Status: DC

## 2013-07-31 MED ORDER — MORPHINE SULFATE 2 MG/ML IJ SOLN
2.0000 mg | INTRAMUSCULAR | Status: DC | PRN
  Administered 2013-07-31 – 2013-08-01 (×2): 2 mg via INTRAVENOUS
  Filled 2013-07-31 (×2): qty 1

## 2013-07-31 NOTE — Progress Notes (Signed)
Received to room 5w34, oriented to room and surroundings, a/ox4,
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
## 2013-07-31 NOTE — Progress Notes (Signed)
Internal Medicine Attending  Date: 07/31/2013  Patient name: Todd [name] Medical record number: [id number] Date of birth: Oct 22, 1945 Age: 68 y.o. Gender: male  I saw and evaluated the patient, and discussed his care on A.M rounds with housestaff.  I reviewed the resident's note by Dr. Mechele Claude and I agree with the resident's findings and plans as documented in her note.

## 2013-07-31 NOTE — Clinical Social Work Psychosocial (Signed)
Clinical Social Work Department BRIEF PSYCHOSOCIAL ASSESSMENT 07/31/2013  Patient:  Todd [name], Todd [name]     Account Number:  [id number]     Admit date:  07/29/2013  Clinical Social Worker:  Lovey Newcomer  Date/Time:  07/31/2013 04:00 PM  Referred by:  Physician  Date Referred:  07/31/2013 Referred for  SNF Placement   Other Referral:   Interview type:  Family Other interview type:   [name] wife interviewed by phone to complete assessment. Per chart patient is only oriented to person. [name] wife Todd [name] is not at bedside.    PSYCHOSOCIAL DATA Living Status:  WIFE Admitted from facility:   Level of care:   Primary support name:  Todd [name] Primary support relationship to patient:  SPOUSE Degree of support available:   Support is fair.    CURRENT CONCERNS Current Concerns  Post-Acute Placement   Other Concerns:    SOCIAL WORK ASSESSMENT / PLAN CSW spoke with patient's wife by phone to complete assessment. CSW explained that PT has recommended SNF placement for patient or 24 hr supervision assistance at home. [name] wife states, "Well he's not going to a SNF unless Medicare will pay for it, we can't afford that. And no one, not even a nursing home can provide him with 24 hr. supervision or assistance. I've been dealing with this for 23 years and I think I know a little more than you about nursing homes." CSW is familiar with patient from previous admissions and is familiar with wife either refusing SNF placement or disagreeing with treatment team.    CSW inquired about patient's past SNF stays. Wife states that patient has been to [facility], and [facility]. She states, "The only facility I have been happy with is [facility] and if he goes to a place it will be there." CSW explained that if patient has used his first 20 medicare days that there would be an expense for SNF. [name] wife reiterated that they would not  pay anything for SNF. CSW inquired about wife's ability to care for patient at home. She states, "[name] was doing great at home until this happened. He was doing great with [facility]'s PT and I hope he can get these services again if he comes home." CSW explained that CSW would send referral to [facility] and let wife know what expense the patient could incur. CSW explained that RNCM would be responsible for reinstating HHPT services if patient can still use this benefit.     Wife states that patient was supposed to get Medicaid which would pay for long term SNF placement. Wife is unsure if application has actually been submitted, she states that facility was helping her with this. CSW will ask facility if this has been done. CSW explained that if patient does not have a pending Medicaid application that she would need to submit one to [place] if she wants patient to be considered for Medicaid. Wife states that she is working at [employer] here in [place] and she sometimes leaves the patient alone at home. She feels that this is acceptable and and states, "He's not stupid, he can be at home alone from time to time." If patient DC's home alone, CSW will make another APS report as this could be an unsafe situation for patient.    CSW has made APS reports on patient in the past as treatment team was concerned that patient was not being cared for at home.   Assessment/plan status:  Psychosocial Support/Ongoing Assessment of Needs Other assessment/ plan:   Complete FL2, Fax, PASRR   Information/referral to community resources:   CSW contact information given to patient's wife.    [name]/FAMILY'S RESPONSE TO PLAN OF CARE: [name] wife would like patient to go to [facility] if there will be no expense for placement. She states that she would take patient home if there would be any cost for SNF. [name] wife seemed very irritated by CSW's assessment. CSW will follow up with wife  once more information is received from [facility] and assist with DC if appropriate.       [name] MSW, [place], [place], [phone]

## 2013-07-31 NOTE — Progress Notes (Signed)
Pt transferred to 5W34 via bed. Report called to RN. All questions answered. Pt stable and w/o any complaints. Candyce Churn

## 2013-07-31 NOTE — Discharge Summary (Signed)
Name: Todd [name] MRN: [id number] DOB: 09-Feb-1946 68 y.o. PCP: Otho Bellows, MD  Date of Admission: 07/29/2013  9:33 AM Date of Discharge: 08/02/2013 Attending Physician: Dr. Marinda Elk  Discharge Diagnosis: Principal Problem:   SIRS (systemic inflammatory response syndrome)- resolved, no infectious source found Active Problems:   SINUS TACHYCARDIA   Chronic back pain   Abnormality of gait   L1 vertebral fracture after fall on 07/27/13  Discharge Medications:   Medication List    STOP taking these medications       oxyCODONE 5 MG immediate release tablet  Commonly known as:  ROXICODONE  Replaced by:  oxycodone 5 MG capsule     traMADol 50 MG tablet  Commonly known as:  ULTRAM      TAKE these medications       albuterol 108 (90 BASE) MCG/ACT inhaler  Commonly known as:  PROAIR HFA  Inhale 2 puffs into the lungs every 6 (six) hours as needed for wheezing.     budesonide-formoterol 80-4.5 MCG/ACT inhaler  Commonly known as:  SYMBICORT  Inhale 2 puffs into the lungs 2 (two) times daily.     clopidogrel 75 MG tablet  Commonly known as:  PLAVIX  Take 1 tablet (75 mg total) by mouth every morning.     desonide 0.05 % cream  Commonly known as:  DESOWEN  Apply 1 application topically 2 (two) times daily as needed (For rash.).     DULoxetine 60 MG capsule  Commonly known as:  CYMBALTA  Take 60 mg by mouth every morning.     fluticasone 50 MCG/ACT nasal spray  Commonly known as:  FLONASE  Place 2 sprays into the nose 2 (two) times daily.     ipratropium-albuterol 0.5-2.5 (3) MG/3ML Soln  Commonly known as:  DUONEB  Take 3 mLs by nebulization every 6 (six) hours as needed. For wheezing     metoprolol tartrate 25 MG tablet  Commonly known as:  LOPRESSOR  Take 1 tablet (25 mg total) by mouth every morning.     MUCINEX 600 MG 12 hr tablet  Generic drug:  guaiFENesin  Take 600 mg by mouth 2 (two) times daily.     multivitamin with minerals Tabs tablet  Take 1  tablet by mouth every morning.     omeprazole 40 MG capsule  Commonly known as:  PRILOSEC  Take 40 mg by mouth every morning.     oxycodone 5 MG capsule  Commonly known as:  OXY-IR  Take 1 capsule (5 mg total) by mouth every 4 (four) hours as needed.     polyvinyl alcohol 1.4 % ophthalmic solution  Commonly known as:  LIQUIFILM TEARS  Place 1 drop into both eyes daily as needed for dry eyes.        Disposition and follow-up:   Todd [name] was discharged from [facility] in Stable condition.  At the hospital follow up visit please address:  1.  How is he doing at home? PT/OT recommend SNF, but pt/wife unable to afford. Sent home with [facility] PT/OT/SW/AIDE/RN.  2. Wife was asked to complete medicaid application to increase coverage for patient. Did she do this yet? 3. Encourage wife to purchase medical alert system for patient at home. 4. If wife has not done so already, encourage her to hire someone to come stay/supervise patient while she is at work (CM provided her with resources and per her estimation should be approx $18 per hour) 5. Patient  with known dysphagia and aspiration risk- should be following dysphagia 2 diet w/ nectar thick liquids  2.  Labs / imaging needed at time of follow-up: none  3.  Pending labs/ test needing follow-up: none  Follow-up Appointments: Follow-up Information   Follow up with [name], MD On 08/18/2013. (1:45PM)    Specialty:  Internal Medicine   Contact information:   [address] [place] [address] [phone]       Discharge Instructions: Discharge Orders   Future Appointments Provider Department Dept Phone   08/18/2013 1:45 PM [name], MD [place] [phone]   Future Orders Complete By Expires   Call MD for:  extreme fatigue  As directed    Call MD for:  persistant nausea and vomiting  As directed    Call MD for:  severe uncontrolled pain  As directed    Call  MD for:  temperature >100.4  As directed    Diet - low sodium heart healthy  As directed    Increase activity slowly  As directed       Consultations:  none  Procedures Performed:  Dg Chest 2 View  07/30/2013   CLINICAL DATA:  Abnormal chest x-ray  EXAM: CHEST  2 VIEW  COMPARISON:  07/30/2013 539 hr  FINDINGS: Cardiac shadow is stable. The lungs are well aerated bilaterally. Right basilar atelectasis is seen. No focal confluent infiltrate is noted. No acute bony abnormality is seen.  IMPRESSION: Right basilar atelectasis.   Electronically Signed   By: Inez Catalina M.D.   On: 07/30/2013 15:40   Dg Chest 2 View  07/30/2013   CLINICAL DATA:  Shortness of breath.  EXAM: CHEST  2 VIEW  COMPARISON:  Chest radiograph performed 07/29/2013  FINDINGS: The lungs are well-aerated. Vascular congestion is noted. Increased interstitial markings on the lateral view raise concern for pulmonary edema. No pleural effusion or pneumothorax is seen.  The heart is normal in size; the mediastinal contour is within normal limits. No acute osseous abnormalities are seen.  IMPRESSION: Vascular congestion noted. Increased interstitial markings on the lateral view raise concern for mild pulmonary edema, though this is less well characterized on the frontal view.   Electronically Signed   By: Garald Balding M.D.   On: 07/30/2013 07:04   Dg Thoracic Spine 2 View  07/28/2013   CLINICAL DATA:  Fall.  Mid and upper back pain.  EXAM: THORACIC SPINE - 2 VIEW  COMPARISON:  DG CHEST 2 VIEW dated 05/18/2013; CT ANGIO CHEST W/CM &/OR WO/CM dated 05/15/2013  FINDINGS: Twelve rib-bearing thoracic type vertebral bodies are present. Mild chronic superior endplate fractures at T3 and T4 are stable. Degenerative changes are noted in the lower cervical spine. Rightward curvature of the mid thoracic spine is stable.  IMPRESSION: 1. No acute abnormality. 2. Stable rightward curvature of the thoracic spine. 3. Remote superior endplate fractures at T3 and  T4.   Electronically Signed   By: Lawrence Santiago M.D.   On: 07/28/2013 13:42   Dg Lumbar Spine Complete  07/28/2013   CLINICAL DATA:  Fall.  Low back pain.  EXAM: LUMBAR SPINE - COMPLETE 4+ VIEW  COMPARISON:  DG LUMBAR SPINE COMPLETE W/BEND dated 04/09/2012; CT ANGIO CHEST W/CM &/OR WO/CM dated 05/15/2013; DG THORACIC SPINE dated 07/28/2013; DG CHEST 2 VIEW dated 05/18/2013  FINDINGS: Five non rib-bearing lumbar type vertebral bodies are present. Remote fractures at T12 and L3 are stable. Retrolisthesis at L3-4 is  stable. The superior endplate fracture at L1 is new since January. There is no definite retropulsion of bone. Alignment is stable. Atherosclerotic calcifications are again seen in the aorta. Moderate stool is present throughout the colon.  IMPRESSION: 1. New superior endplate fracture at L1 may be acute. 2. Stable remote fractures at T12 and L3. 3. Stable retrolisthesis at L3-4.   Electronically Signed   By: Lawrence Santiago M.D.   On: 07/28/2013 13:47   Ct Cervical Spine Wo Contrast  07/28/2013   CLINICAL DATA:  Pain post fall  EXAM: CT CERVICAL SPINE WITHOUT CONTRAST  TECHNIQUE: Multidetector CT imaging of the cervical spine was performed without intravenous contrast. Multiplanar CT image reconstructions were also generated.  COMPARISON:  10/18/2009  FINDINGS: Normal alignment. Mild narrowing of the C4-5 interspace, moderate narrowing C5-6 and C6-7 with anterior posterior endplate spurs. Facet and uncovertebral spurs result in foraminal encroachment right C4-5. No prevertebral soft tissue swelling. Facets are seated. Negative for fracture.  IMPRESSION: 1. Negative for fracture or other acute abnormality. 2. Spondylitic changes C4-C7 as above   Electronically Signed   By: Arne [place] M.D.   On: 07/28/2013 13:44   Dg Chest Port 1 View  07/29/2013   CLINICAL DATA:  Tachycardia  EXAM: PORTABLE CHEST - 1 VIEW  COMPARISON:  05/18/2013  FINDINGS: Cardiomediastinal silhouette is stable. No acute infiltrate  or pleural effusion. No pulmonary edema. Bilateral basilar atelectasis.  IMPRESSION: No active disease.   Electronically Signed   By: Lahoma Crocker M.D.   On: 07/29/2013 10:21    Admission HPI:  JOHANNES EVERAGE is a 68 y.o. man with a pmhx of recurrent aspiration PNA, TBI, CVA, seizures, who presented to the ED with a cc of SOB. History was limited due to patients severe dysarthria. I called the patients spouse to acquire additional history. The spouse was very talkative and provided very lengthy answers that were often tangential to the question asked. The patient had been doing well at home since his wife took him out of the SNF (~Feb 12th). The patient continues to choke and cough with all of his meals the patients spouse states that she is an expert in caring for him and she knows what food he can swallow. She cuts up his food so that he can eat it. She states that someone is always present when he eats. However, I am uncertain of this as she works during the day. The was in his normal state of health until yesterday when he had a fall when he was left home alone while his spouse went to the store for "34 minutes". He was in a lot of pain and was taken to the ED. He was diagnosed with a new L1 fracture and d/c home. The patients spouse states that with his current pain, she was unable to help him to move. EMS was called on 4/4 and the patient was brought back to the [facility] ED. Patient was febrile leukocytosis, and tachycardic at that time. IMTS was consulted for admission.  Hospital Course by problem list:   SIRS: The patient originally presented with SIRS of an unknown source, tachycardic, fever 101 rectally, leukocytosis to 18 in ED. Initially started on vanc/zosyn for suspected ? HCAP vs CAP vs aspiration PNA and transitioned to levaquin IV x 24 hrs. Patient appeared hypovolemic with questionable PO intake, so was fluid resucitated with NS 3L in ED and kept on NS at 125cc/hr overnight. After fluid  resuscitation, patient's tachycardia resolved, he  remained afebrile and leukocytosis downtrended nicely 18-->11. CXR in ED negative and repeat showed some mild interstitial edema, no focal infiltrate. UA negative. UCx negative. BCx NGTD x 4 days. Lactate wnl. Given there was no source of infection found and all symptoms/labs improving, abx discontinued on HD 2.  On HD 2, patient thought to be somewhat fluid overloaded from his significant resuscitation especially given faint crackles on exam, so he was given lasix 20mg  IV x 1 dose which he responded well to. Patient remained afebrile with stable vital signs and leukocytosis resolved even after discontinuation of abx. Overall, patient's 3/4 SIRS thought to be combination of hypovolemia 2/2 decreased PO and stress response from recent fall 2 days prior. Patient not orthostatic during his hospitalization. Patient is known to aspirate his food and intermittently did have loud transmitted upper airway sounds, usually after eating. RT consulted to suction/pumonary toilet. PT/OT evaluated patient and both recommended SNF. Patient no longer qualified for medicare 20 day full coverage and would have been required to pay 20% copay ($158/day). CSW and CM worked together to provide patient and wife with options for placement. Wife unable to pay for SNF upfront or with a payment plan. LTACH and CIR were consulted for placement, both deemed patient inappropriate for their services. At time of discharge, we had exhausted all options and we arranged for patient to go home with [facility] PT/OT/RN/AIDE/SW. Wife was provided with resources to find someone to hire to supervise patient while she is at work (per CM should be approx $18/hr). Wife also encouraged to purchase a home alert system for patient to use in case of emergency. Patient was clinically stable on day of discharge to home.  L1 Compression Fracture: Occurred after fall on 4/2. Conservative management with pain meds.  He was  on tramadol and oxycodone IR for breakthrough at home. Per wife, tramadol makes patient "act weird" so this was discontinued per her request. We added morphine 2mg  IV q4h prn. PT OT and SW for likely SNF placement for rehab (see above description for placement issues).  Dysphasia: Known aspiration with PO intake. Per chart review, patient and family had made the decision to keep feeding patient despite continued aspiration risk. He had been sent home to SNF on dysphagia 1, pudding thick diet. He was treated for aspiration PNA 05/2013. Initially kept NPO for SLP eval. SLP recommended FEES which showed moderate amount of oropharyngeal secretions from nasal cavity, though there was no pooling and patient was tolerating well. SLP believe he has some aspirated material stuck in trachea which is causing the intermittend loud upper airway noises. SLP recommend dys 2 diet w/ nectar thick liquids. Patient discharged on this diet as well.   Dysarthria:  Baseline, unchanged. COPD: Stable, continued home meds, Duonebs and symbicort.  CVA: Stable. Continued Plavix .  CAD: Stable, continued Metoprolol 25mg  daily PUD: Stable, continued home PPI  Discharge Vitals:   BP 136/87  Pulse 74  Temp(Src) 97.9 F (36.6 C) (Oral)  Resp 18  Ht 6' (1.829 m)  Wt 196 lb 3.4 oz (89 kg)  BMI 26.60 kg/m2  SpO2 92%  Discharge Labs:  No results found for this or any previous visit (from the past 24 hour(s)).  Signed: Rebecca Eaton, MD 08/02/2013, 9:25 PM   Time Spent on Discharge: 35 minutes Services Ordered on Discharge: [facility] PT/OT/SW/AIDE/RN Equipment Ordered on Discharge: none

## 2013-07-31 NOTE — Progress Notes (Signed)
Subjective: Patient has continued significant back pain this morning. Also complaining of dry mouth. Denies CP, cough, SOB. SLP had recommended NPO overnight with modified barium swallow today, however I was contacted by SLP today and they would prefer to perform FEES today instead. PT/OT recommend SNF.  Objective: Vital signs in last 24 hours: Filed Vitals:   07/31/13 0722 07/31/13 0900 07/31/13 1028 07/31/13 1212  BP: 127/80  127/80   Pulse:   76   Temp:    97.6 F (36.4 C)  TempSrc:    Oral  Resp: 14     Height:      Weight:      SpO2: 95% 98%    O2 sat above are on room air  Weight change:   Intake/Output Summary (Last 24 hours) at 07/31/13 1229 Last data filed at 07/31/13 1213  Gross per 24 hour  Intake 2714.58 ml  Output    776 ml  Net 1938.58 ml   Physical Exam General: alert, cooperative, and in no apparent distress; sitting upright with audible loud, gurgling breathing HEENT: pupils equal round and reactive to light, vision grossly intact, oropharynx clear and non-erythematous  Neck: supple Lungs: loud transmitted upper airway breath sounds, ?LLL crackles but otherwise clear, normal work of respiration Heart: regular rate and rhythm, no murmurs, gallops, or rubs Abdomen: soft, non-tender, non-distended, normal bowel sounds Extremities: warm b/l, no BLE edema Neurologic: alert & oriented X3, dysarthric, sitting upright in bed and is able to pull himself up in chair with minimal assist    Lab Results: Basic Metabolic Panel:  Recent Labs Lab 07/29/13 0958  NA 142  K 3.9  CL 107  CO2 19  GLUCOSE 182*  BUN 23  CREATININE 0.87  CALCIUM 9.4   Liver Function Tests:  Recent Labs Lab 07/29/13 0958  AST 32  ALT 15  ALKPHOS 59  BILITOT 1.2  PROT 7.6  ALBUMIN 4.0   CBC:  Recent Labs Lab 07/30/13 0250 07/31/13 0350  WBC 11.1* 7.3  NEUTROABS 9.0* 5.1  HGB 12.9* 13.1  HCT 38.9* 38.9*  MCV 101.3* 99.5  PLT 163 171   Urinalysis:  Recent  Labs Lab 07/29/13 1049  COLORURINE AMBER*  LABSPEC 1.036*  PHURINE 5.5  GLUCOSEU NEGATIVE  HGBUR NEGATIVE  BILIRUBINUR MODERATE*  KETONESUR NEGATIVE  PROTEINUR 30*  UROBILINOGEN 1.0  NITRITE NEGATIVE  LEUKOCYTESUR NEGATIVE   Micro Results: Recent Results (from the past 240 hour(s))  CULTURE, BLOOD (ROUTINE X 2)     Status: None   Collection Time    07/29/13  9:55 AM      Result Value Ref Range Status   Specimen Description BLOOD LEFT ARM   Final   Special Requests BOTTLES DRAWN AEROBIC AND ANAEROBIC 5CC EACH   Final   Culture  Setup Time     Final   Value: 07/29/2013 14:52     Performed at [facility]   Culture     Final   Value:        BLOOD CULTURE RECEIVED NO GROWTH TO DATE CULTURE WILL BE HELD FOR 5 DAYS BEFORE ISSUING A FINAL NEGATIVE REPORT     Performed at [facility]   Report Status PENDING   Incomplete  CULTURE, BLOOD (ROUTINE X 2)     Status: None   Collection Time    07/29/13 10:15 AM      Result Value Ref Range Status   Specimen Description BLOOD RIGHT ANTECUBITAL   Final  Special Requests BOTTLES DRAWN AEROBIC AND ANAEROBIC 5 CC EACH   Final   Culture  Setup Time     Final   Value: 07/29/2013 14:52     Performed at [facility]   Culture     Final   Value:        BLOOD CULTURE RECEIVED NO GROWTH TO DATE CULTURE WILL BE HELD FOR 5 DAYS BEFORE ISSUING A FINAL NEGATIVE REPORT     Performed at [facility]   Report Status PENDING   Incomplete  URINE CULTURE     Status: None   Collection Time    07/29/13 10:49 AM      Result Value Ref Range Status   Specimen Description URINE, CATHETERIZED   Final   Special Requests NONE   Final   Culture  Setup Time     Final   Value: 07/29/2013 21:45     Performed at [facility]     Final   Value: NO GROWTH     Performed at [facility]   Culture     Final   Value: NO GROWTH     Performed at [facility]   Report Status 07/30/2013 FINAL    Final  MRSA PCR SCREENING     Status: None   Collection Time    07/30/13  6:12 AM      Result Value Ref Range Status   MRSA by PCR NEGATIVE  NEGATIVE Final   Comment:            The GeneXpert MRSA Assay (FDA     approved for NASAL specimens     only), is one component of a     comprehensive MRSA colonization     surveillance program. It is not     intended to diagnose MRSA     infection nor to guide or     monitor treatment for     MRSA infections.   Studies/Results: Dg Chest 2 View  07/30/2013   CLINICAL DATA:  Abnormal chest x-ray  EXAM: CHEST  2 VIEW  COMPARISON:  07/30/2013 539 hr  FINDINGS: Cardiac shadow is stable. The lungs are well aerated bilaterally. Right basilar atelectasis is seen. No focal confluent infiltrate is noted. No acute bony abnormality is seen.  IMPRESSION: Right basilar atelectasis.   Electronically Signed   By: Inez Catalina M.D.   On: 07/30/2013 15:40   Dg Chest 2 View  07/30/2013   CLINICAL DATA:  Shortness of breath.  EXAM: CHEST  2 VIEW  COMPARISON:  Chest radiograph performed 07/29/2013  FINDINGS: The lungs are well-aerated. Vascular congestion is noted. Increased interstitial markings on the lateral view raise concern for pulmonary edema. No pleural effusion or pneumothorax is seen.  The heart is normal in size; the mediastinal contour is within normal limits. No acute osseous abnormalities are seen.  IMPRESSION: Vascular congestion noted. Increased interstitial markings on the lateral view raise concern for mild pulmonary edema, though this is less well characterized on the frontal view.   Electronically Signed   By: Garald Balding M.D.   On: 07/30/2013 07:04   Medications: I have reviewed the patient's current medications. Scheduled Meds: . budesonide-formoterol  2 puff Inhalation BID  . clopidogrel  75 mg Oral q morning - 10a  . DULoxetine  60 mg Oral q morning - 10a  . enoxaparin (LOVENOX) injection  40 mg Subcutaneous Q24H  . fluticasone  2 spray  Each  Nare BID  . guaiFENesin  600 mg Oral BID  . metoprolol tartrate  25 mg Oral q morning - 10a  . multivitamin with minerals  1 tablet Oral q morning - 10a  . pantoprazole  40 mg Oral Daily  . sodium chloride  3 mL Intravenous Q12H   Continuous Infusions:   PRN Meds:.ipratropium-albuterol, morphine injection, oxyCODONE, RESOURCE THICKENUP CLEAR  Assessment/Plan: SIRS: Tachycardia resolved, leukocytosis resolved, WBC count 7.3 today. Received IVF at 100cc/hr and kept NPO overnight. He remains afebrile since admission. Repeat CXR done yesterday showed some vascular congestion and mild pulmonary edema. Patient does not have cough (except at times with eating) or SOB. +SIRS thought to be 2/2 likely dehydration (BUN/Cr ratio on admission >20, labs looked hemoconcentrated and questionable PO intake at home) vs stress response from recent fall/L1 compression fracture. No infectious source identified, so will discontinue abx for now.  -d/c IVF -d/c levaquin -transfer to med surg -dysph 2 diet w/ nectar thick liquids -incentive spirometry given patient likely not taking deep breaths 2/2 his back pain -UCx negative -f/u BCX, NGTD x 2 days -continue pulm toilet per RT  L1 Compression Fracture: Conservative management with pain meds.  Discontinued tramadol yesterday per wife's request. Continue oxycodone IR and added morphine 2mg  IV q3h prn. -PT/OT rec SNF -CSW for placement  Dysphasia: Per SLP, patient aspirating thin liquids, recommend dys 2 diet w/ nectar thick liquids. FEES was performed today and per SLP, there was moderate amount of oropharyngeal secretions from nasal cavity, though there was no pooling and patient was tolerating well. They believe he has some aspirated material stuck in trachea which is causing the current loud upper airway noises.  -dysph 2 diet with nectar thick liquids  Dysarthria: Appears to be at baseline.  COPD: Appears stable, continue home meds, Duonebs and symbicort.   CVA: Appear stable. Continue Plavix .  CAD: Appears stable, continue Metoprolol 25mg  daily PUD: Appears stable, Continue home PPI  Dispo: Disposition is deferred at this time, awaiting improvement of current medical problems.  Anticipated discharge in approximately 1 day(s).   The patient does have a current PCP Otho Bellows, MD) and does need an [facility] hospital follow-up appointment after discharge.  The patient does not have transportation limitations that hinder transportation to clinic appointments.  .Services Needed at time of discharge: Y = Yes, Blank = No PT:   OT:   RN:   Equipment:   Other:     LOS: 2 days   Rebecca Eaton, MD 07/31/2013, 12:29 PM

## 2013-08-01 LAB — CBC WITH DIFFERENTIAL/PLATELET
BASOS ABS: 0 10*3/uL (ref 0.0–0.1)
BASOS PCT: 0 % (ref 0–1)
Eosinophils Absolute: 0.2 10*3/uL (ref 0.0–0.7)
Eosinophils Relative: 3 % (ref 0–5)
HEMATOCRIT: 42.6 % (ref 39.0–52.0)
Hemoglobin: 14.7 g/dL (ref 13.0–17.0)
Lymphocytes Relative: 20 % (ref 12–46)
Lymphs Abs: 1.3 10*3/uL (ref 0.7–4.0)
MCH: 33.7 pg (ref 26.0–34.0)
MCHC: 34.5 g/dL (ref 30.0–36.0)
MCV: 97.7 fL (ref 78.0–100.0)
Monocytes Absolute: 0.6 10*3/uL (ref 0.1–1.0)
Monocytes Relative: 9 % (ref 3–12)
NEUTROS ABS: 4.7 10*3/uL (ref 1.7–7.7)
NEUTROS PCT: 68 % (ref 43–77)
Platelets: 220 10*3/uL (ref 150–400)
RBC: 4.36 MIL/uL (ref 4.22–5.81)
RDW: 12.8 % (ref 11.5–15.5)
WBC: 6.9 10*3/uL (ref 4.0–10.5)

## 2013-08-01 NOTE — Progress Notes (Signed)
Subjective: Per nurse tech, patient became slightly agitated overnight and was pulling on his tele/lines so mitts were placed bilaterally. Patient denies SOB, cough, CP, abd pain, N/V. FEES done yesterday showed only aspiration with thin liquids, pt approved for dysphagia 2 diet w/ nectar thick liquids. Patient feeding well this morning with assistance of nurse tech. Patient received lasix 20mg  IV once yesterday, net I/O overnight negative 1.4L.    Per CSW, wife is only willing to send patient to [facility] SNF if there will be no cost to her and patient. Otherwise, she plans to take him home.   Objective: Vital signs in last 24 hours: Filed Vitals:   07/31/13 1633 07/31/13 1832 07/31/13 2157 08/01/13 0502  BP: 124/77 149/91 129/75 138/63  Pulse:  72 79 63  Temp:  98.4 F (36.9 C) 97.5 F (36.4 C) 97.2 F (36.2 C)  TempSrc:  Oral Oral Oral  Resp: 20 18 18 18   Height:      Weight:      SpO2: 96% 96% 93% 92%  O2 sat above are on room air  Weight change:   Intake/Output Summary (Last 24 hours) at 08/01/13 0810 Last data filed at 08/01/13 0505  Gross per 24 hour  Intake      3 ml  Output   1500 ml  Net  -1497 ml   Physical Exam General: alert, cooperative, and in no apparent distress; sitting upright with audible loud, gurgling breathing HEENT: vision grossly intact, oropharynx clear and non-erythematous  Neck: supple Lungs: loud transmitted upper airway breath sounds, normal WOB, maintaining good oxygen saturations on room air Heart: regular rate and rhythm, no murmurs, gallops, or rubs Abdomen: soft, non-tender, non-distended, normal bowel sounds Extremities: warm b/l, no BLE edema Neurologic: alert & oriented X2 (when asked about the year he kept saying I don't know, though yesterday was able to tell me it was 2015), dysarthric, sitting upright in bed and eating well  Lab Results: Basic Metabolic Panel:  Recent Labs Lab 07/29/13 0958  NA 142  K 3.9  CL 107    CO2 19  GLUCOSE 182*  BUN 23  CREATININE 0.87  CALCIUM 9.4   Liver Function Tests:  Recent Labs Lab 07/29/13 0958  AST 32  ALT 15  ALKPHOS 59  BILITOT 1.2  PROT 7.6  ALBUMIN 4.0   CBC:  Recent Labs Lab 07/30/13 0250 07/31/13 0350  WBC 11.1* 7.3  NEUTROABS 9.0* 5.1  HGB 12.9* 13.1  HCT 38.9* 38.9*  MCV 101.3* 99.5  PLT 163 171   Urinalysis:  Recent Labs Lab 07/29/13 1049  COLORURINE AMBER*  LABSPEC 1.036*  PHURINE 5.5  GLUCOSEU NEGATIVE  HGBUR NEGATIVE  BILIRUBINUR MODERATE*  KETONESUR NEGATIVE  PROTEINUR 30*  UROBILINOGEN 1.0  NITRITE NEGATIVE  LEUKOCYTESUR NEGATIVE   Micro Results: Recent Results (from the past 240 hour(s))  CULTURE, BLOOD (ROUTINE X 2)     Status: None   Collection Time    07/29/13  9:55 AM      Result Value Ref Range Status   Specimen Description BLOOD LEFT ARM   Final   Special Requests BOTTLES DRAWN AEROBIC AND ANAEROBIC [facility] EACH   Final   Culture  Setup Time     Final   Value: 07/29/2013 14:52     Performed at [facility]   Culture     Final   Value:        BLOOD CULTURE RECEIVED NO GROWTH TO DATE CULTURE WILL BE  HELD FOR 5 DAYS BEFORE ISSUING A FINAL NEGATIVE REPORT     Performed at [facility]   Report Status PENDING   Incomplete  CULTURE, BLOOD (ROUTINE X 2)     Status: None   Collection Time    07/29/13 10:15 AM      Result Value Ref Range Status   Specimen Description BLOOD RIGHT ANTECUBITAL   Final   Special Requests BOTTLES DRAWN AEROBIC AND ANAEROBIC 5 CC EACH   Final   Culture  Setup Time     Final   Value: 07/29/2013 14:52     Performed at [facility]   Culture     Final   Value:        BLOOD CULTURE RECEIVED NO GROWTH TO DATE CULTURE WILL BE HELD FOR 5 DAYS BEFORE ISSUING A FINAL NEGATIVE REPORT     Performed at [facility]   Report Status PENDING   Incomplete  URINE CULTURE     Status: None   Collection Time    07/29/13 10:49 AM      Result Value Ref Range Status    Specimen Description URINE, CATHETERIZED   Final   Special Requests NONE   Final   Culture  Setup Time     Final   Value: 07/29/2013 21:45     Performed at [facility]     Final   Value: NO GROWTH     Performed at [facility]   Culture     Final   Value: NO GROWTH     Performed at [facility]   Report Status 07/30/2013 FINAL   Final  MRSA PCR SCREENING     Status: None   Collection Time    07/30/13  6:12 AM      Result Value Ref Range Status   MRSA by PCR NEGATIVE  NEGATIVE Final   Comment:            The GeneXpert MRSA Assay (FDA     approved for NASAL specimens     only), is one component of a     comprehensive MRSA colonization     surveillance program. It is not     intended to diagnose MRSA     infection nor to guide or     monitor treatment for     MRSA infections.   Studies/Results: Dg Chest 2 View  07/30/2013   CLINICAL DATA:  Abnormal chest x-ray  EXAM: CHEST  2 VIEW  COMPARISON:  07/30/2013 539 hr  FINDINGS: Cardiac shadow is stable. The lungs are well aerated bilaterally. Right basilar atelectasis is seen. No focal confluent infiltrate is noted. No acute bony abnormality is seen.  IMPRESSION: Right basilar atelectasis.   Electronically Signed   By: Inez Catalina M.D.   On: 07/30/2013 15:40   Medications: I have reviewed the patient's current medications. Scheduled Meds: . budesonide-formoterol  2 puff Inhalation BID  . clopidogrel  75 mg Oral q morning - 10a  . DULoxetine  60 mg Oral q morning - 10a  . enoxaparin (LOVENOX) injection  40 mg Subcutaneous Q24H  . fluticasone  2 spray Each Nare BID  . furosemide  20 mg Intravenous Once  . guaiFENesin  600 mg Oral BID  . metoprolol tartrate  25 mg Oral q morning - 10a  . multivitamin with minerals  1 tablet Oral q morning - 10a  . pantoprazole  40 mg Oral  Daily  . sodium chloride  3 mL Intravenous Q12H   Continuous Infusions:   PRN Meds:.ipratropium-albuterol, morphine  injection, oxyCODONE, RESOURCE THICKENUP CLEAR  Assessment/Plan:  SIRS: Antibiotics were discontinued yesterday AM. Patient remains afebrile with normal HR, leukocytosis resolved. Patient denies cough, SOB. Patient with rales on lung exam yesterday that are now resolved (thought difficult exam 2/2 transmitted upper airway sounds) s/p lasix 20mg  IV x 1 dose, net I/O negative 1.4L yesterday. Main issue currently is placement with consideration of patient safety. CSW spoke with wife yesterday who will only agree to SNF if patient can be sent to [facility] AND if pt's medicare covers ALL cost. She will not send him to SNF if she has to pay for anything out of pocket as she cannot afford this. I agree with CSW regarding concern for patient safety at home if sent home and agree with APS investigation if wife does not agree to SNF at time of discharge. Most recent update from [place] is that patient will be at his 1 wellness period as of 4/12 and will at that time qualify for full coverage at SNF for 20 days. [facility] has agreed to admission directly from home on 4/12. HHSW will be arranged to coordinate this admission. Will also arrange [facility] PT/OT/RN/Aide for patient until he is admitted to SNF. -incentive spirometry given back pain -BCx x 2 NGTD x 3 days -continue pulm toilet per RT  L1 Compression Fracture: Conservative management with pain meds. Continue oxycodone IR and added morphine 2mg  IV q3h prn. -PT/OT rec SNF- see above -CSW is on board  Dysphasia: Per SLP, continue dys 2 diet w/ nectar thick liquids.   Dysarthria: Appears to be at baseline.  COPD: Appears stable, continue home meds, Duonebs and symbicort.  CVA: Appear stable. Continue Plavix .  CAD: Appears stable, continue Metoprolol 25mg  daily PUD: Appears stable, Continue home PPI  Dispo: Disposition is deferred at this time, awaiting improvement of current medical problems.  Anticipated discharge in approximately 1 day(s).   The  patient does have a current PCP Otho Bellows, MD) and does need an [facility] hospital follow-up appointment after discharge.  The patient does not have transportation limitations that hinder transportation to clinic appointments.  .Services Needed at time of discharge: Y = Yes, Blank = No PT:   OT:   RN:   Equipment:   Other:     LOS: 3 days   Rebecca Eaton, MD 08/01/2013, 8:10 AM

## 2013-08-01 NOTE — Progress Notes (Signed)
Physical Therapy Treatment Patient Details Name: Todd [name] MRN: [id number] DOB: January 21, 1946 Today's Date: 08/01/2013    History of Present Illness Todd [name] is a 68 y.o. man with a pmhx of recurrent aspiration PNA, TBI, CVA, seizures, who presented to the ED with a cc of SOB.  The was in his normal state of health until yesterday when he had a fall when he was left home alone while his spouse went to the store for "34 minutes". He was in a lot of pain and was taken to the ED. He was diagnosed with a new L1 fracture and d/c home. The patients spouse states that with his current pain, she was unable to help him to move. EMS was called on 4/4 and the patient was brought back to the [facility] ED. Patient was febrile leukocytosis, and tachycardic at that time.    PT Comments    Pt agreeable to PT treatment, fatigues easily but participates in balance activities with frequent rests.  Continues to require mod/max physical assistance due to balance deficits, will need 24 hour care at d/c.  Follow Up Recommendations  SNF;Supervision/Assistance - 24 hour     Equipment Recommendations  None recommended by PT    Recommendations for Other Services       Precautions / Restrictions Precautions Precautions: Fall Restrictions Weight Bearing Restrictions: No    Mobility  Bed Mobility   Bed Mobility: Supine to Sit     Supine to sit: Min assist;HOB elevated     General bed mobility comments: cues for hand placement and sequencing, assist at trunk  Transfers Overall transfer level: Needs assistance   Transfers: Sit to/from Stand Sit to Stand: Mod assist         General transfer comment: sit to stand x 5 with focus on anterior wt shift as pt loses balance posteriorly, B toes up during standing.  cues for hand placement  Ambulation/Gait                 Stairs            Wheelchair Mobility    Modified Rankin (Stroke Patients Only)       Balance            Standing balance support: During functional activity Standing balance-Leahy Scale: Poor Standing balance comment: treatment focused on standing balance with reaching activities.  Pt requires mod/max A due to strong posterior lean standing with 1 UE support.  Pt performed reaching all directions out of BOS, requires mod A for reaching up, L, R, max A to squat and reach down.  Pt fatigues easily and c/o back pain with standing > 5 minutes, RN aware                    Cognition Arousal/Alertness: Awake/alert Behavior During Therapy: WFL for tasks assessed/performed Overall Cognitive Status: History of cognitive impairments - at baseline                      Exercises      General Comments        Pertinent Vitals/Pain Pt c/o back pain with mobility, RN aware    Home Living                      Prior Function            PT Goals (current goals can now be found in the care plan section) Progress  towards PT goals: Progressing toward goals    Frequency  Min 3X/week    PT Plan      Co-evaluation             End of Session Equipment Utilized During Treatment: Gait belt Activity Tolerance: Patient limited by fatigue Patient left: in bed;with call bell/phone within reach;with bed alarm set     Time: 1035-1058 PT Time Calculation (min): 23 min  Charges:  $Therapeutic Activity: 23-37 mins                    G Codes:      Todd [name] 08/28/13, 11:00 AM

## 2013-08-01 NOTE — Progress Notes (Signed)
Internal Medicine Attending  Date: 08/01/2013  Patient name: Todd [name] Medical record number: [id number] Date of birth: 1946/04/06 Age: 68 y.o. Gender: male  I saw and evaluated the patient on A.M rounds, and I discussed his care with housestaff.  I reviewed the resident's note by Dr. Mechele Claude and I agree with the resident's findings and plans as documented in her note.

## 2013-08-01 NOTE — Clinical Social Work Note (Signed)
CSW left message for wife stating that patient's hospitalizations have reset the "60 day" count until his SNF days reset. CSW will follow up with patient's wife in morning. At this time patient will either need to go to SNF and incur the 20% copay cost of the patient's wife will need to take home with 24 hr. Supervision.   [name] MSW, [place], [place], [phone]

## 2013-08-01 NOTE — Progress Notes (Signed)
Pt. nasotracheally suctioned per order for moderate amount thick/tenascious/tan mucus, RN aware-suggested due to audible Rhonchi-gurgling, no complications, pt. tolerated well, P-82, R-18, 94% room air sat, scant amount blood-tinged catheter upon removal.

## 2013-08-01 NOTE — Progress Notes (Signed)
Speech Language Pathology Treatment: Dysphagia  Patient Details Name: Todd [name] MRN: [id number] DOB: 04/17/1946 Today's Date: 08/01/2013 Time: 9937-1696 SLP Time Calculation (min): 16 min  Assessment / Plan / Recommendation Clinical Impression  SLP provided total assist in positioning pt adequate in bed for PO trials. Also cued pt in clearing audible upper airway secretions with moderate success. Pt continues to sound very congested even after clearing. SLP provided trials of nectar thick liquids mostly with success though there was one episode of possible sensed penetration/aspriation with a larger cup sip. Pt tolerated crunchy solids very well. Continues to need close supervision, but hopeful pt will continue to tolerate diet.    HPI HPI: 68 y.o.man with past medical history of traumatic brain injury, CVA, seizures, recurrent aspiration, pneumonia, presented to ED with shortness of breath. He has been in the ED s/p fall and found to have L1 fracture. TBI is over 66 years old, pt is able to communicate in basic phrases, dysarthria moderate to severe.  Pt with known history of dysphagia, MBS last year recommend dys 1/honey with silent aspiration of thin and nectar. Pt has been home with wife reportedly tolerating nectar /dys 2. He has been observed to have congested upper airway, has had NT suction. Recommended NPO during BSE, with f/u MBS, changed to FEES to visualize airway.    Pertinent Vitals NA  SLP Plan  Continue with current plan of care    Recommendations Diet recommendations: Dysphagia 2 (fine chop);Nectar-thick liquid Liquids provided via: Cup;No straw Medication Administration: Crushed with puree Supervision: Staff to assist with self feeding Compensations: Slow rate;Small sips/bites;Multiple dry swallows after each bite/sip Postural Changes and/or Swallow Maneuvers: Seated upright 90 degrees;Upright 30-60 min after meal              Oral Care Recommendations: Oral care  BID Follow up Recommendations: Skilled Nursing facility Plan: Continue with current plan of care    GO    [facility], MA CCC-SLP [phone]  Lynann Beaver 08/01/2013, 2:22 PM

## 2013-08-01 NOTE — Progress Notes (Signed)
[facility]  Patient Status: Active (receiving services up to time of hospitalization)  [facility] is providing the following services: RN Will add PT, OT, MSW, and HHA as ordered.  If patient discharges after hours, please call [phone].   [name] 08/01/2013, 3:20 PM

## 2013-08-01 NOTE — Clinical Social Work Note (Signed)
Per wife's request CSW contact [facility] (last snf patient was at) to see how many Medicare days he used at their facility. Facility states that patient was at their facility from 05/19/13 to 06/08/13 and has not yet had the 60 day wellness period which would reset his Medicare days. Wife states that she doesn't want him to go to SNF if she needs to pay for it. Wife asks, "Why can't he just stay in the hospital." CSW explained that patient will be discharged when medically stable whether that is home with her or to a SNF. CSW explained that facility stated that they HAD NOT submitted Medicaid application for patient and encouraged the wife to submit a Medicaid application to DSS ASAP.  [name] MSW, [place], [place], [phone]

## 2013-08-02 MED ORDER — OXYCODONE HCL 5 MG PO CAPS: 5.0000 mg | ORAL_CAPSULE | ORAL | Status: DC | PRN

## 2013-08-02 NOTE — Discharge Planning (Signed)
[name] IV taken out without incident. Called and spoke to patient's wife with discharge instructions. Tonita Cong, RN

## 2013-08-02 NOTE — Clinical Social Work Note (Signed)
CSW contacted [facility] and asked if they would consider patient as a Medicaid Pending patient. Facility states they will not be able to accept patient as Medicaid pending and wife is unwilling to pay 20% copay for SNF. MD states they are trying to get patient into CIR, and if patient is not admitted to CIR, patient will return home.   [name] MSW, [place], [place], [phone]

## 2013-08-02 NOTE — Progress Notes (Signed)
Thank you for consult on Todd [name]. He has OBS due to prior TBI, CVA as well as chronic issues with dysphagia and aspiration PNA. Wife works and doubt that he has the supervision that he requires at home. He is deconditioned would not be able to tolerate CIR level therapies. We concur  with PT/ST recommendations of SNF for follow up therapy to give him the time he needs to get to supervision level. Will defer CIR consult for now.

## 2013-08-02 NOTE — Progress Notes (Signed)
Physical Therapy Treatment Patient Details Name: Todd [name] MRN: [id number] DOB: [birth year] Today's Date: 08/02/2013    History of Present Illness Todd [name] is a 68 y.o. man with a pmhx of recurrent aspiration PNA, TBI, CVA, seizures, who presented to the ED with a cc of SOB.  The was in his normal state of health until yesterday when he had a fall when he was left home alone while his spouse went to the store for "34 minutes". He was in a lot of pain and was taken to the ED. He was diagnosed with a new L1 fracture and d/c home. The patients spouse states that with his current pain, she was unable to help him to move. EMS was called on 4/4 and the patient was brought back to the [facility] ED. Patient was febrile leukocytosis, and tachycardic at that time.    PT Comments    Patient needing increased assist for bed mobility and sitting balance.  Feel may be due to back pain or increased dizziness potentially due to mediations.  Agree pt would need lower level of rehab than CIR and would potentially need longer length of stay.  Continue to recommend SNF level rehab at this time.  Will continue PT in acute setting until d/c.  Follow Up Recommendations  SNF;Supervision/Assistance - 24 hour     Equipment Recommendations  None recommended by PT    Recommendations for Other Services       Precautions / Restrictions Precautions Precautions: Fall    Mobility  Bed Mobility Overal bed mobility: Needs Assistance Bed Mobility: Rolling;Sidelying to Sit Rolling: Max assist Sidelying to sit: Max assist       General bed mobility comments: patient stiff in trunk and c/o pain all over (RN just medicated) took increased time and bed level trunk mobility to allow pt to sit up  Transfers Overall transfer level: Needs assistance Equipment used: Rolling walker (2 wheeled) Transfers: Sit to/from Stand Sit to Stand: Mod assist;Max assist Stand pivot transfers: Mod assist       General  transfer comment: mod assist up from edge of bed, pt pulling up on walker, increased assist up from chair (lower surface); able to step to window with walker and turn with walker to chair; faciliation for weight shifts and cues for stepping technique  Ambulation/Gait Ambulation/Gait assistance: Mod assist Ambulation Distance (Feet): 5 Feet Assistive device: Rolling walker (2 wheeled) Gait Pattern/deviations: Step-to pattern;Wide base of support;Decreased weight shift to right;Shuffle;Decreased stride length     General Gait Details: assist to weight shift right and to progress left LE   Stairs            Wheelchair Mobility    Modified Rankin (Stroke Patients Only)       Balance Overall balance assessment: History of Falls;Needs assistance Sitting-balance support: Bilateral upper extremity supported Sitting balance-Leahy Scale: Poor Sitting balance - Comments: falling to right sitting edge of bed x 12 minutes; left pelvis lifted off surface of bed at times; needs frequent cues, close supervision to min assist for balance; performed trunk mobility activities for improving balance; weight shift Postural control: Right lateral lean   Standing balance-Leahy Scale: Poor Standing balance comment: assist for anterior weight shift and pt with bil UE support on walker; stood looking out window with walker x about 2-3 minutes x 2                    Cognition Arousal/Alertness: Awake/alert Behavior During Therapy: Flat  affect Overall Cognitive Status: No family/caregiver present to determine baseline cognitive functioning                      Exercises Other Exercises Other Exercises: supine knee rocking for trunk rotation AAROM/PROM Other Exercises: supine single knee to chest stretch PROM rt/lt x 2 Other Exercises: supine hamstring stretch rt/lt x 20 sec hold    General Comments        Pertinent Vitals/Pain C/o "pain all over" RN had medicated prior to tx     Home Living                      Prior Function            PT Goals (current goals can now be found in the care plan section) Progress towards PT goals: Not progressing toward goals - comment (increased assist for mobility today question increased back pain)    Frequency  Min 3X/week    PT Plan Current plan remains appropriate    Co-evaluation             End of Session Equipment Utilized During Treatment: Gait belt Activity Tolerance: Patient limited by fatigue Patient left: in chair;with chair alarm set;with call bell/phone within reach     Time: 6606-3016 PT Time Calculation (min): 47 min  Charges:  $Gait Training: 8-22 mins $Therapeutic Exercise: 8-22 mins $Therapeutic Activity: 8-22 mins                    G Codes:      Max Sane 04-Aug-2013, 10:47 AM Magda Kiel, [place] 08/04/2013

## 2013-08-02 NOTE — Discharge Instructions (Signed)
Todd [name]. THERE ARE SYSTEMS YOU CAN PURCHASE THAT DO NOT REQUIRE A MONTHLY PAYMENT.   PLEASE CONSIDER HIRING FULL TIME SUPERVISION FOR 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  PLEASE BE AWARE OF THE DIET RECOMMENDATIONS LISTED BELOW.  PLEASE SUBMIT THE MEDICAID APPLICATION.  Dysphagia Diet with Thickened Liquids (NECTAR THICK LIQUIDS) When you are diagnosed with difficulty swallowing (dysphagia), your health care provider may recommend a texture-modified diet. This diet may involve you thickening your liquids to make them easier and safer to swallow. This makes them less likely to enter into the lungs and cause aspiration pneumonia. Aspiration pneumonia is when your lungs become inflamed from breathing in foreign material.  You may have to thicken your liquids using commercial thickeners or purchase prethickened products. Please follow product guidelines for thickening liquids to the recommended consistency.  There are four standard consistencies of liquids that are recognized by the National Dysphagia Diet:  Thin.   Nectar-like.  Honey-like.  Spoon-thick. THIN LIQUIDS Thin liquids require no additional preparation. They include:  Water.  Juice.   Tea.  Coffee.  Milk.   Anything that can melt into a thin liquid such as gelatin dessert and ice cream.   Broth. NECTAR THICK Nectar-thick liquids fall slowly from a spoon and resemble the thickness of peach nectar. Some foods are naturally nectar thick such as buttermilk, eggnog, milk shakes, tomato juice, supplements, and cream soups. Any other liquids will need to be pre-thickened or thickened with a commercial thickener to a nectar-thick consistency. Please follow product guidelines. HONEY THICK Honey-thick liquids can fall from a spoon but are too thick to sip from a straw. They resemble the thickness of honey. Tomato sauce would be another naturally occurring product that is honey thick. Any  other liquids will need to be prethickened or thickened with a commercial thickener to a honey-thick consistency. Please follow product guidelines.  SPOON THICK Spoon-thick liquids maintain their shape and need to be taken with a spoon. Pudding is an example of a spoon-thick liquid. Any other liquids consumed would need to be thickened with a commercial thickener to a spoon-thick consistency. Please follow product guidelines.  Document Released: 10/13/2011 Document Revised: 12/14/2012 Document Reviewed: 09/20/2012 [facility] Patient Information 2014 [place]. Dysphagia Swallowing problems (dysphagia) occur when solids and liquids seem to stick in your throat on the way down to your stomach, or the food takes longer to get to the stomach. Other symptoms include regurgitating food, noises coming from the throat, chest discomfort with swallowing, and a feeling of fullness or the feeling of something being stuck in your throat when swallowing. When blockage in your throat is complete it may be associated with drooling. CAUSES  Problems with swallowing may occur because of problems with the muscles. The food cannot be propelled in the usual manner into your stomach. You may have ulcers, scar tissue, or inflammation in the tube down which food travels from your mouth to your stomach (esophagus), which blocks food from passing normally into the stomach. Causes of inflammation include:  Acid reflux from your stomach into your esophagus.  Infection.  Radiation treatment for cancer.  Medicines taken without enough fluids to wash them down into your stomach. You may have nerve problems that prevent signals from being sent to the muscles of your esophagus to contract and move your food down to your stomach. Globus pharyngeus is a relatively common problem in which there is a sense of an obstruction or difficulty in  swallowing, without any physical abnormalities of the swallowing passages being found. This  problem usually improves over time with reassurance and testing to rule out other causes. DIAGNOSIS Dysphagia can be diagnosed and its cause can be determined by tests in which you swallow a white substance that helps illuminate the inside of your throat (contrast medium) while X-rays are taken. Sometimes a flexible telescope that is inserted down your throat (endoscopy) to look at your esophagus and stomach is used. TREATMENT   If the dysphagia is caused by acid reflux or infection, medicines may be used.  If the dysphagia is caused by problems with your swallowing muscles, swallowing therapy may be used to help you strengthen your swallowing muscles.  If the dysphagia is caused by a blockage or mass, procedures to remove the blockage may be done. HOME CARE INSTRUCTIONS  Try to eat soft food that is easier to swallow and check your weight on a daily basis to be sure that it is not decreasing.  Be sure to drink liquids when sitting upright (not lying down). SEEK MEDICAL CARE IF:  You are losing weight because you are unable to swallow.  You are coughing when you drink liquids (aspiration).  You are coughing up partially digested food. SEEK IMMEDIATE MEDICAL CARE IF:  You are unable to swallow your own saliva .  You are having shortness of breath or a fever, or both.  You have a hoarse voice along with difficulty swallowing. MAKE SURE YOU:  Understand these instructions.  Will watch your condition.  Will get help right away if you are not doing well or get worse. Document Released: 04/10/2000 Document Revised: 12/14/2012 Document Reviewed: 09/30/2012 [facility] Patient Information 2014 [place].   Aspiration Precautions Aspiration is the inhaling of a liquid or object into the lungs. Things that can be inhaled into the lungs include:  Food.  Any type of liquid, such as drinks or saliva.  Stomach contents, such as vomit or stomach acid. When these things go into the  lungs, damage can occur. Serious complications can then result, such as:  A lung infection (pneumonia).  A collection of pus in the lungs (lung abscess). CAUSES  A decreased level of awareness (consciousness) due to:  Traumatic brain injury or head injury.  Stroke.  Neurological disease.  Seizures.  Decreased or absent gag reflex (inability to cough).  Medical conditions that affect swallowing.  Conditions that affect the food pipe (esophagus) such as a narrowing of the esophagus (esophageal stricture).  Gastroesophageal reflux (GERD). This is also known as acid reflux.  Any type of surgery where you are put under general anesthesia or have sedation.  Drinking large amounts of alcohol.  Taking medication that causes drowsiness, confusion, or weakness.  Aging.  Dental problems.  Having a feeding tube. SYMPTOMS When aspiration occurs, different signs and symptoms can occur, such as:  Coughing (if a person has a cough or gag reflex) after swallowing food or liquids.  Difficulty breathing. This can include things like:  Breathing rapidly.  Breathing very slowly.  Hearing "gurgling" lung sounds when a person breaths.  Coughing up phlegm (sputum) that is:  Yellow, tan, or green in color.  Has pieces of food in it.  Bad smelling.  A change in voice (hoarseness).  A change in skin color. The skin may turn a "bluish" type color because of a lack of oxygen (cyanosis).  Fever. DIAGNOSIS  A chest X-ray may be performed. This takes a picture of your lungs.  It can show changes in the lungs if aspiration has occurred.  A bronchoscopy may be performed. This is a surgical procedure in which a thin, flexible tube with a camera at the end is inserted into the nose or mouth. The tube is advanced to the lungs so your caregiver can view the lungs and obtain a culture, tissue sample, or remove an aspirated object.  A swallowing evaluation study may be performed to  evaluate:  A person's risk of aspiration.  How difficult it is for a person to swallow.  What types of foods are safe for a person to eat. PREVENTION If you are a caregiver to someone who may aspirate, follow the directions below. If you are caring for someone who can eat and drink through their mouth:  Have them sit in an upright position when eating food or drinking fluids, such as:  Sitting up in a chair.  If sitting in a chair is not possible, position the person in bed so they are upright.  Remind the person to eat slowly and chew well.  Do not distract the person. This is especially important for people with thinking or memory (cognitive) problems.  Check the person's mouth for leftover food after eating.  Keep the person sitting upright for 30 to 45 minutes after eating.  Do not serve food or drink for at least 2 hours before bedtime. If you are caring for someone with a feeding tube and he or she cannot eat or drink through their mouth:  Keep the person in an upright position as much as possible.  Do not  lay the person flat if they are getting continuous feedings. Turn the feeding pump off if you need to lay the person flat for any reason.  Check feeding tube residuals as directed by your caregiver. If a large amount of tube feedings are pulled back (aspirated) from the feeding tube, call your caregiver right away. General guidelines to prevent aspiration include:  Feed small amounts of food. Do not force feed.  Use as little water as possible when brushing the person's teeth or cleaning his or her mouth.  Provide oral care before and after meals.  Never put food or fluids in the mouth of a person who is not fully alert.  Crush pills and put them in soft food such as pudding or ice cream. Some pills should not be crushed. Check with your caregiver before crushing any medication. SEEK IMMEDIATE MEDICAL CARE IF:   The person has trouble breathing or starts to  breathe rapidly.  The person is breathing very slowly or stops breathing.  The person coughs a lot after eating or drinking.  The person has a chronic cough.  The person coughs up thick, yellow, or tan sputum.  The person has a fever or persistent symptoms for more than 72 hours.  The person has a fever and their symptoms suddenly get worse. Document Released: 05/16/2010 Document Revised: 07/06/2011 Document Reviewed: 05/16/2010 [facility] Patient Information 2014 [place], [place].

## 2013-08-02 NOTE — Progress Notes (Signed)
Subjective: [name] main complaint this morning is HA. He is not very talkative with me today, but denies SOB, cough, CP, abd pain.   Objective: Vital signs in last 24 hours: Filed Vitals:   08/01/13 1321 08/01/13 2136 08/01/13 2210 08/02/13 0500  BP: 127/72  138/84 128/74  Pulse: 71  84 74  Temp: 98.2 F (36.8 C)  98.6 F (37 C) 98.4 F (36.9 C)  TempSrc: Oral  Oral Oral  Resp: 20  20 18   Height:      Weight:      SpO2: 95% 93% 93% 95%  O2 sat above are on room air  Weight change:   Intake/Output Summary (Last 24 hours) at 08/02/13 0847 Last data filed at 08/01/13 1844  Gross per 24 hour  Intake    700 ml  Output    800 ml  Net   -100 ml   Physical Exam General: alert, cooperative, and in no apparent distress; lying flat and breathing quietly  HEENT: vision grossly intact, oropharynx clear and non-erythematous  Neck: supple Lungs: CTAB, normal WOB Heart: regular rate and rhythm, no murmurs, gallops, or rubs Abdomen: soft, non-tender, non-distended, normal bowel sounds Extremities: warm b/l, no BLE edema Neurologic: alert, dysarthric, lying flat in bed moving all extremities spontaneously  Lab Results: Basic Metabolic Panel:  Recent Labs Lab 07/29/13 0958  NA 142  K 3.9  CL 107  CO2 19  GLUCOSE 182*  BUN 23  CREATININE 0.87  CALCIUM 9.4   Liver Function Tests:  Recent Labs Lab 07/29/13 0958  AST 32  ALT 15  ALKPHOS 59  BILITOT 1.2  PROT 7.6  ALBUMIN 4.0   CBC:  Recent Labs Lab 07/31/13 0350 08/01/13 0915  WBC 7.3 6.9  NEUTROABS 5.1 4.7  HGB 13.1 14.7  HCT 38.9* 42.6  MCV 99.5 97.7  PLT 171 220   Urinalysis:  Recent Labs Lab 07/29/13 1049  COLORURINE AMBER*  LABSPEC 1.036*  PHURINE 5.5  GLUCOSEU NEGATIVE  HGBUR NEGATIVE  BILIRUBINUR MODERATE*  KETONESUR NEGATIVE  PROTEINUR 30*  UROBILINOGEN 1.0  NITRITE NEGATIVE  LEUKOCYTESUR NEGATIVE   Micro Results: Recent Results (from the past 240 hour(s))  CULTURE, BLOOD  (ROUTINE X 2)     Status: None   Collection Time    07/29/13  9:55 AM      Result Value Ref Range Status   Specimen Description BLOOD LEFT ARM   Final   Special Requests BOTTLES DRAWN AEROBIC AND ANAEROBIC 5CC EACH   Final   Culture  Setup Time     Final   Value: 07/29/2013 14:52     Performed at [facility]   Culture     Final   Value:        BLOOD CULTURE RECEIVED NO GROWTH TO DATE CULTURE WILL BE HELD FOR 5 DAYS BEFORE ISSUING A FINAL NEGATIVE REPORT     Performed at [facility]   Report Status PENDING   Incomplete  CULTURE, BLOOD (ROUTINE X 2)     Status: None   Collection Time    07/29/13 10:15 AM      Result Value Ref Range Status   Specimen Description BLOOD RIGHT ANTECUBITAL   Final   Special Requests BOTTLES DRAWN AEROBIC AND ANAEROBIC 5 CC EACH   Final   Culture  Setup Time     Final   Value: 07/29/2013 14:52     Performed at [facility]  Final   Value:        BLOOD CULTURE RECEIVED NO GROWTH TO DATE CULTURE WILL BE HELD FOR 5 DAYS BEFORE ISSUING A FINAL NEGATIVE REPORT     Performed at [facility]   Report Status PENDING   Incomplete  URINE CULTURE     Status: None   Collection Time    07/29/13 10:49 AM      Result Value Ref Range Status   Specimen Description URINE, CATHETERIZED   Final   Special Requests NONE   Final   Culture  Setup Time     Final   Value: 07/29/2013 21:45     Performed at [facility]     Final   Value: NO GROWTH     Performed at [facility]   Culture     Final   Value: NO GROWTH     Performed at [facility]   Report Status 07/30/2013 FINAL   Final  MRSA PCR SCREENING     Status: None   Collection Time    07/30/13  6:12 AM      Result Value Ref Range Status   MRSA by PCR NEGATIVE  NEGATIVE Final   Comment:            The GeneXpert MRSA Assay (FDA     approved for NASAL specimens     only), is one component of a     comprehensive MRSA colonization      surveillance program. It is not     intended to diagnose MRSA     infection nor to guide or     monitor treatment for     MRSA infections.   Studies/Results: No results found. Medications: I have reviewed the patient's current medications. Scheduled Meds: . budesonide-formoterol  2 puff Inhalation BID  . clopidogrel  75 mg Oral q morning - 10a  . DULoxetine  60 mg Oral q morning - 10a  . enoxaparin (LOVENOX) injection  40 mg Subcutaneous Q24H  . fluticasone  2 spray Each Nare BID  . furosemide  20 mg Intravenous Once  . guaiFENesin  600 mg Oral BID  . metoprolol tartrate  25 mg Oral q morning - 10a  . multivitamin with minerals  1 tablet Oral q morning - 10a  . pantoprazole  40 mg Oral Daily  . sodium chloride  3 mL Intravenous Q12H   Continuous Infusions:   PRN Meds:.ipratropium-albuterol, morphine injection, oxyCODONE, RESOURCE THICKENUP CLEAR  Assessment/Plan:  SIRS: Patient remains afebrile with normal HR, leukocytosis resolved as of yesterday. Patient denies SOB, cough. Lungs CTAB today and patient with quiet breathing. Placement remains main issue. Per CSW, patient's 60 day wellness period resets with each hospitalization. He will need to be out of the hospital for 60 days before qualifying for SNF placement w/ full payment (for 20 days). CM tells me she will speak with wife about hiring a sitter while she is at work. CIR may be indicated. Will consult CIR for possible admission. -CIR consult -incentive spirometry given back pain -BCx x 2 NGTD x 4 days -continue pulm toilet per RT  L1 Compression Fracture: Conservative management with pain meds. Continue oxycodone IR and added morphine 2mg  IV q3h prn. -PT/OT rec SNF- see above -CSW is on board  Dysphasia: Per SLP, continue dys 2 diet w/ nectar thick liquids.   Dysarthria: Appears to be at baseline.  COPD: Appears stable, continue home meds, Duonebs  and symbicort.  CVA: Appear stable. Continue Plavix .  CAD:  Appears stable, continue Metoprolol 25mg  daily PUD: Appears stable, Continue home PPI  Dispo: Disposition is deferred at this time, awaiting improvement of current medical problems.  Anticipated discharge in approximately 1 day(s).   The patient does have a current PCP Otho Bellows, MD) and does need an [facility] hospital follow-up appointment after discharge.  The patient does not have transportation limitations that hinder transportation to clinic appointments.  .Services Needed at time of discharge: Y = Yes, Blank = No PT:   OT:   RN:   Equipment:   Other:     LOS: 4 days   Rebecca Eaton, MD 08/02/2013, 8:47 AM
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
## 2013-08-02 NOTE — Progress Notes (Signed)
08/02/13 PT/OT recommended SNF. Per CSW patient's wife refused SNF. Per MD request consulted LTAC, patient not appropriate for LTAC. Patient active with [facility] for HHPT, wife agreeable to continue to work with [facility]. Contacted Donna with [facility] and added HHRN, HHOT, aide and SW to HHPT with [facility]. Sent list of  private duty agencies to wife via the ambulance transport at discharge.Fuller Plan RN, BSN, CCM

## 2013-08-02 NOTE — Progress Notes (Signed)
Internal Medicine Attending  Date: 08/02/2013  Patient name: Todd [name] Medical record number: [id number] Date of birth: October 11, 1945 Age: 68 y.o. Gender: male  I saw and evaluated the patient, and discussed his care on A.M rounds with housestaff.  I reviewed the resident's note by Dr. Mechele Claude and I agree with the resident's findings and plans as documented in her note.

## 2013-08-04 LAB — CULTURE, BLOOD (ROUTINE X 2)
CULTURE: NO GROWTH
Culture: NO GROWTH
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
## 2013-08-17 ENCOUNTER — Encounter: Payer: Medicare Other | Admitting: Internal Medicine

## 2013-08-18 ENCOUNTER — Encounter: Payer: Medicare Other | Admitting: Internal Medicine

## 2013-08-30 ENCOUNTER — Ambulatory Visit: Payer: Medicare Other | Admitting: Internal Medicine

## 2013-09-05 ENCOUNTER — Encounter: Payer: Self-pay | Admitting: Internal Medicine

## 2013-09-05 ENCOUNTER — Ambulatory Visit (INDEPENDENT_AMBULATORY_CARE_PROVIDER_SITE_OTHER): Payer: Medicare Other | Admitting: Internal Medicine

## 2013-09-05 VITALS — BP 104/73 | HR 72 | Temp 96.9°F | Wt 181.3 lb

## 2013-09-05 DIAGNOSIS — R1310 Dysphagia, unspecified: Secondary | ICD-10-CM

## 2013-09-05 DIAGNOSIS — M8448XA Pathological fracture, other site, initial encounter for fracture: Secondary | ICD-10-CM

## 2013-09-05 DIAGNOSIS — M810 Age-related osteoporosis without current pathological fracture: Secondary | ICD-10-CM

## 2013-09-05 DIAGNOSIS — I2510 Atherosclerotic heart disease of native coronary artery without angina pectoris: Secondary | ICD-10-CM

## 2013-09-05 DIAGNOSIS — M8080XA Other osteoporosis with current pathological fracture, unspecified site, initial encounter for fracture: Secondary | ICD-10-CM

## 2013-09-05 MED ORDER — MORPHINE SULFATE 15 MG PO TABS: 7.5000 mg | ORAL_TABLET | Freq: Two times a day (BID) | ORAL | Status: DC | PRN

## 2013-09-05 MED ORDER — METOPROLOL TARTRATE 12.5 MG HALF TABLET: 25.0000 mg | ORAL_TABLET | Freq: Every morning | ORAL | Status: DC

## 2013-09-05 MED ORDER — METOPROLOL TARTRATE 12.5 MG HALF TABLET: 12.5000 mg | ORAL_TABLET | Freq: Every morning | ORAL | Status: DC
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
## 2013-09-05 NOTE — Patient Instructions (Signed)
-  Please decrease metoprolol to 12.5mg  daily (half a tablet)  -Please discontinue tramadol  -You may take Morphine Sulfate 7.5mg  (half tablet) twice a day as needed for pain.  I am providing 15 tablets, which should last 1 month - please return at that time to be re-evaluated.  Thank you for bringing your medicines today. This helps [place] keep you safe from mistakes.  Please be sure to bring all of your medications with you to every visit.  Should you have any new or worsening symptoms, please be sure to call the clinic at [phone].

## 2013-09-05 NOTE — Assessment & Plan Note (Signed)
Discussed diet- wife has been consistent with thickened liquid diet, she cooks meats to be tender.  They understand aspiration risk.  If he develops aspiration PNA requiring intubation, they would pursue this.

## 2013-09-05 NOTE — Assessment & Plan Note (Signed)
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 Fixed defects are also noted.  No longer being followed by Dr. Doylene Canard  -decrease metoprolol to 12.5mg  daily -cont plavix

## 2013-09-07 NOTE — Progress Notes (Signed)
Case discussed with Dr. Sharda soon after the resident saw the patient.  We reviewed the resident's history and exam and pertinent patient test results.  I agree with the assessment, diagnosis, and plan of care documented in the resident's note. 

## 2013-09-13 ENCOUNTER — Other Ambulatory Visit: Payer: Self-pay | Admitting: *Deleted

## 2013-09-14 MED ORDER — DESLORATADINE 5 MG PO TABS: 5.0000 mg | ORAL_TABLET | Freq: Every day | ORAL | Status: DC

## 2013-09-21 ENCOUNTER — Ambulatory Visit: Payer: Medicare Other | Admitting: Internal Medicine

## 2013-09-21 ENCOUNTER — Other Ambulatory Visit: Payer: Self-pay | Admitting: *Deleted

## 2013-09-29 ENCOUNTER — Other Ambulatory Visit: Payer: Self-pay | Admitting: Internal Medicine

## 2013-10-05 ENCOUNTER — Telehealth: Payer: Self-pay | Admitting: *Deleted

## 2013-10-05 NOTE — Telephone Encounter (Signed)
Wife called clinic and states has noticed tears from pt eyes recently - wife states pt seems to be more sad. On Cymbalta for about 10 years - thought maybe the dose needs to be changed. Appt made 10/06/13 3:15PM Dr Eyvonne Mechanic. Hilda Blades Aissa Lisowski RN 10/05/13 2PM

## 2013-10-06 ENCOUNTER — Encounter: Payer: Self-pay | Admitting: Internal Medicine

## 2013-10-06 ENCOUNTER — Ambulatory Visit (INDEPENDENT_AMBULATORY_CARE_PROVIDER_SITE_OTHER): Payer: Medicare Other | Admitting: Internal Medicine

## 2013-10-06 VITALS — BP 102/68 | HR 81 | Temp 97.8°F | Ht 72.0 in | Wt 179.4 lb

## 2013-10-06 DIAGNOSIS — F322 Major depressive disorder, single episode, severe without psychotic features: Secondary | ICD-10-CM

## 2013-10-06 DIAGNOSIS — M8080XA Other osteoporosis with current pathological fracture, unspecified site, initial encounter for fracture: Secondary | ICD-10-CM

## 2013-10-06 DIAGNOSIS — Z87820 Personal history of traumatic brain injury: Secondary | ICD-10-CM

## 2013-10-06 DIAGNOSIS — G8929 Other chronic pain: Secondary | ICD-10-CM

## 2013-10-06 DIAGNOSIS — M549 Dorsalgia, unspecified: Secondary | ICD-10-CM

## 2013-10-06 DIAGNOSIS — R32 Unspecified urinary incontinence: Secondary | ICD-10-CM

## 2013-10-06 DIAGNOSIS — R8290 Unspecified abnormal findings in urine: Secondary | ICD-10-CM

## 2013-10-06 MED ORDER — DULOXETINE HCL 30 MG PO CPEP: 90.0000 mg | ORAL_CAPSULE | Freq: Every morning | ORAL | Status: DC

## 2013-10-06 MED ORDER — MORPHINE SULFATE 15 MG PO TABS: ORAL_TABLET | ORAL | Status: DC

## 2013-10-06 MED ORDER — TRAMADOL HCL 50 MG PO TABS: 50.0000 mg | ORAL_TABLET | Freq: Two times a day (BID) | ORAL | Status: DC | PRN

## 2013-10-06 NOTE — Assessment & Plan Note (Signed)
Patients partner reports worsening of his depression lately. Feeling sad, not talking as much, crying occasionally.  Plans: Increase Cymbalta to 90mg  qd. Informed patients partner that the doses higher than 60 mg have questionable benefits and recommended to call us if she notices any side effects.

## 2013-10-06 NOTE — Assessment & Plan Note (Signed)
Patients partner c/o foul smelling urine recently. No other urinary symptoms and physical exam is normal. Couldn't collect urine sample in the clinic.  Plans: Urine sample bottle given to the patient and instructed to collect urine specimen and drop it in the lab within one hour after collecting the urine. Check U/A, Urine cultures.
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
## 2013-10-06 NOTE — Patient Instructions (Signed)
Collect the urine sample as instructed in the bottle provided and bring it to the lab within one hour after collection. Use the Morphine, Tramadol as instructed. If you notice any sedation, please call the clinic and inform us.

## 2013-10-06 NOTE — Assessment & Plan Note (Signed)
Patient complains of occasional headaches and his partner requesting tramadol to be taken prn.  Plans: Tramadol 50 mg BID prn. #30 given.

## 2013-10-06 NOTE — Progress Notes (Signed)
I saw patient and discussed his care with Dr. Eyvonne Mechanic at the time of the visit.  We reviewed the resident's history and exam and pertinent patient test results.  I agree with the assessment, diagnosis, and plan of care documented in the resident's note.

## 2013-10-06 NOTE — Assessment & Plan Note (Signed)
Patients partner states that the Morphine 7.5 mg is not relieving his pain completely and is asking for higher dose. Patient is only taking Morphine as needed. Once a day to sometimes once every 2-3 days. Patients partner reports giving 15 mg over the last few weeks. Discussed with the attending regarding further management and plan.  Plans: Increase Morphine to 7.5 mg -15 mg BID as needed for pain. #30 refill given. Caused patients partner to watch for any signs of sedation and to inform us so we can lower the dose. Also cautioned not to give Tramadol and Morphine at the same time to decrease sedation effects.

## 2013-10-16 ENCOUNTER — Other Ambulatory Visit: Payer: Self-pay | Admitting: *Deleted

## 2013-10-16 MED ORDER — CLOPIDOGREL BISULFATE 75 MG PO TABS: 75.0000 mg | ORAL_TABLET | Freq: Every morning | ORAL | Status: DC

## 2013-10-16 MED ORDER — OMEPRAZOLE 40 MG PO CPDR: 40.0000 mg | DELAYED_RELEASE_CAPSULE | Freq: Every morning | ORAL | Status: DC

## 2013-10-23 ENCOUNTER — Telehealth: Payer: Self-pay | Admitting: *Deleted

## 2013-10-23 NOTE — Telephone Encounter (Signed)
I don't see a reason why this patient should be on this medication and is not currently on his medication list. I will not renew the medication at this time and revisit the need for this medication on the patient's next visit

## 2013-10-23 NOTE — Telephone Encounter (Signed)
[facility] sent a request for Chlorhexidine 0.12% rinse. Rx written 08/17/12 and last refill 06/16/13. Hilda Blades Ditzler RN 10/23/13 11AM

## 2013-10-24 ENCOUNTER — Other Ambulatory Visit: Payer: Self-pay | Admitting: *Deleted

## 2013-10-24 MED ORDER — FLUTICASONE PROPIONATE 50 MCG/ACT NA SUSP: 2.0000 | Freq: Two times a day (BID) | NASAL | Status: DC

## 2013-10-25 ENCOUNTER — Other Ambulatory Visit: Payer: Self-pay | Admitting: Internal Medicine

## 2013-10-25 MED ORDER — CETIRIZINE HCL 5 MG PO TABS: 5.0000 mg | ORAL_TABLET | Freq: Every day | ORAL | Status: DC | PRN

## 2013-11-05 ENCOUNTER — Emergency Department (HOSPITAL_COMMUNITY): Payer: Medicare Other

## 2013-11-05 ENCOUNTER — Encounter (HOSPITAL_COMMUNITY): Payer: Self-pay | Admitting: Emergency Medicine

## 2013-11-05 ENCOUNTER — Inpatient Hospital Stay (HOSPITAL_COMMUNITY)
Admission: EM | Admit: 2013-11-05 | Discharge: 2013-11-09 | DRG: 871 | Disposition: A | Discharge status: Other Acute Inpatient Hospital | Payer: Medicare Other | Attending: Oncology | Admitting: Oncology

## 2013-11-05 DIAGNOSIS — B1920 Unspecified viral hepatitis C without hepatic coma: Secondary | ICD-10-CM | POA: Diagnosis present

## 2013-11-05 DIAGNOSIS — K219 Gastro-esophageal reflux disease without esophagitis: Secondary | ICD-10-CM | POA: Diagnosis present

## 2013-11-05 DIAGNOSIS — G8929 Other chronic pain: Secondary | ICD-10-CM | POA: Diagnosis present

## 2013-11-05 DIAGNOSIS — J9600 Acute respiratory failure, unspecified whether with hypoxia or hypercapnia: Secondary | ICD-10-CM | POA: Diagnosis present

## 2013-11-05 DIAGNOSIS — R29810 Facial weakness: Secondary | ICD-10-CM | POA: Diagnosis present

## 2013-11-05 DIAGNOSIS — Z886 Allergy status to analgesic agent status: Secondary | ICD-10-CM

## 2013-11-05 DIAGNOSIS — M810 Age-related osteoporosis without current pathological fracture: Secondary | ICD-10-CM | POA: Diagnosis present

## 2013-11-05 DIAGNOSIS — A4159 Other Gram-negative sepsis: Principal | ICD-10-CM | POA: Diagnosis present

## 2013-11-05 DIAGNOSIS — Z7902 Long term (current) use of antithrombotics/antiplatelets: Secondary | ICD-10-CM

## 2013-11-05 DIAGNOSIS — D696 Thrombocytopenia, unspecified: Secondary | ICD-10-CM | POA: Diagnosis present

## 2013-11-05 DIAGNOSIS — Z8673 Personal history of transient ischemic attack (TIA), and cerebral infarction without residual deficits: Secondary | ICD-10-CM

## 2013-11-05 DIAGNOSIS — R6520 Severe sepsis without septic shock: Secondary | ICD-10-CM

## 2013-11-05 DIAGNOSIS — J4489 Other specified chronic obstructive pulmonary disease: Secondary | ICD-10-CM | POA: Diagnosis present

## 2013-11-05 DIAGNOSIS — Z85828 Personal history of other malignant neoplasm of skin: Secondary | ICD-10-CM

## 2013-11-05 DIAGNOSIS — T17908A Unspecified foreign body in respiratory tract, part unspecified causing other injury, initial encounter: Secondary | ICD-10-CM

## 2013-11-05 DIAGNOSIS — J449 Chronic obstructive pulmonary disease, unspecified: Secondary | ICD-10-CM | POA: Diagnosis present

## 2013-11-05 DIAGNOSIS — I2510 Atherosclerotic heart disease of native coronary artery without angina pectoris: Secondary | ICD-10-CM | POA: Diagnosis present

## 2013-11-05 DIAGNOSIS — K279 Peptic ulcer, site unspecified, unspecified as acute or chronic, without hemorrhage or perforation: Secondary | ICD-10-CM | POA: Diagnosis present

## 2013-11-05 DIAGNOSIS — A498 Other bacterial infections of unspecified site: Secondary | ICD-10-CM

## 2013-11-05 DIAGNOSIS — M545 Low back pain, unspecified: Secondary | ICD-10-CM | POA: Diagnosis present

## 2013-11-05 DIAGNOSIS — A419 Sepsis, unspecified organism: Secondary | ICD-10-CM | POA: Diagnosis present

## 2013-11-05 DIAGNOSIS — I69922 Dysarthria following unspecified cerebrovascular disease: Secondary | ICD-10-CM

## 2013-11-05 DIAGNOSIS — N390 Urinary tract infection, site not specified: Secondary | ICD-10-CM | POA: Diagnosis present

## 2013-11-05 DIAGNOSIS — M8080XA Other osteoporosis with current pathological fracture, unspecified site, initial encounter for fracture: Secondary | ICD-10-CM

## 2013-11-05 DIAGNOSIS — Z87820 Personal history of traumatic brain injury: Secondary | ICD-10-CM

## 2013-11-05 DIAGNOSIS — R34 Anuria and oliguria: Secondary | ICD-10-CM | POA: Diagnosis present

## 2013-11-05 HISTORY — DX: Other gram-negative sepsis: A41.59

## 2013-11-05 LAB — I-STAT CG4 LACTIC ACID, ED: LACTIC ACID, VENOUS: 2.2 mmol/L (ref 0.5–2.2)

## 2013-11-05 LAB — BLOOD GAS, ARTERIAL
Acid-base deficit: 4.2 mmol/L — ABNORMAL HIGH (ref 0.0–2.0)
Acid-base deficit: 7 mmol/L — ABNORMAL HIGH (ref 0.0–2.0)
BICARBONATE: 19.6 meq/L — AB (ref 20.0–24.0)
BICARBONATE: 17.5 meq/L — AB (ref 20.0–24.0)
Drawn by: 232811
Drawn by: 232811
FIO2: 1 %
FIO2: 0.5 %
O2 SAT: 99.2 %
O2 Saturation: 98.4 %
PCO2 ART: 39.5 mmHg (ref 35.0–45.0)
PCO2 ART: 38.2 mmHg (ref 35.0–45.0)
PEEP: 6 cmH2O
PEEP: 6 cmH2O
PH ART: 7.298 — AB (ref 7.350–7.450)
PO2 ART: 233 mmHg — AB (ref 80.0–100.0)
PO2 ART: 141 mmHg — AB (ref 80.0–100.0)
PRESSURE SUPPORT: 6 cmH2O
Patient temperature: 104.5
Patient temperature: 103.3
TCO2: 17.5 mmol/L (ref 0–100)
TCO2: 15.5 mmol/L (ref 0–100)
pH, Arterial: 7.335 — ABNORMAL LOW (ref 7.350–7.450)

## 2013-11-05 LAB — URINE MICROSCOPIC-ADD ON

## 2013-11-05 LAB — URINALYSIS, ROUTINE W REFLEX MICROSCOPIC
Bilirubin Urine: NEGATIVE
Glucose, UA: NEGATIVE mg/dL
Ketones, ur: NEGATIVE mg/dL
Nitrite: POSITIVE — AB
PROTEIN: 30 mg/dL — AB
Specific Gravity, Urine: 1.033 — ABNORMAL HIGH (ref 1.005–1.030)
Urobilinogen, UA: 1 mg/dL (ref 0.0–1.0)
pH: 6 (ref 5.0–8.0)

## 2013-11-05 LAB — LACTIC ACID, PLASMA: LACTIC ACID, VENOUS: 2.2 mmol/L (ref 0.5–2.2)

## 2013-11-05 LAB — BASIC METABOLIC PANEL
ANION GAP: 15 (ref 5–15)
BUN: 31 mg/dL — ABNORMAL HIGH (ref 6–23)
CO2: 20 mEq/L (ref 19–32)
Calcium: 9.4 mg/dL (ref 8.4–10.5)
Chloride: 101 mEq/L (ref 96–112)
Creatinine, Ser: 1.14 mg/dL (ref 0.50–1.35)
GFR, EST AFRICAN AMERICAN: 76 mL/min — AB (ref >90)
GFR, EST NON AFRICAN AMERICAN: 65 mL/min — AB (ref >90)
Glucose, Bld: 162 mg/dL — ABNORMAL HIGH (ref 70–99)
POTASSIUM: 4.2 meq/L (ref 3.7–5.3)
SODIUM: 136 meq/L — AB (ref 137–147)

## 2013-11-05 LAB — CBC WITH DIFFERENTIAL/PLATELET
Basophils Absolute: 0 10*3/uL (ref 0.0–0.1)
Basophils Relative: 0 % (ref 0–1)
EOS ABS: 0 10*3/uL (ref 0.0–0.7)
EOS PCT: 0 % (ref 0–5)
HEMATOCRIT: 45.3 % (ref 39.0–52.0)
Hemoglobin: 14.7 g/dL (ref 13.0–17.0)
Lymphocytes Relative: 2 % — ABNORMAL LOW (ref 12–46)
Lymphs Abs: 0.2 10*3/uL — ABNORMAL LOW (ref 0.7–4.0)
MCH: 32 pg (ref 26.0–34.0)
MCHC: 32.5 g/dL (ref 30.0–36.0)
MCV: 98.5 fL (ref 78.0–100.0)
Monocytes Absolute: 0.2 10*3/uL (ref 0.1–1.0)
Monocytes Relative: 2 % — ABNORMAL LOW (ref 3–12)
Neutro Abs: 9.9 10*3/uL — ABNORMAL HIGH (ref 1.7–7.7)
Neutrophils Relative %: 96 % — ABNORMAL HIGH (ref 43–77)
Platelets: 170 10*3/uL (ref 150–400)
RBC: 4.6 MIL/uL (ref 4.22–5.81)
RDW: 13.8 % (ref 11.5–15.5)
WBC: 10.3 10*3/uL (ref 4.0–10.5)

## 2013-11-05 MED ORDER — ACETAMINOPHEN 325 MG PO TABS: 650.0000 mg | ORAL_TABLET | Freq: Once | ORAL | Status: DC

## 2013-11-05 MED ORDER — VANCOMYCIN HCL IN DEXTROSE 1-5 GM/200ML-% IV SOLN
1000.0000 mg | Freq: Two times a day (BID) | INTRAVENOUS | Status: DC
  Administered 2013-11-06: 1000 mg via INTRAVENOUS
  Filled 2013-11-05 (×2): qty 200

## 2013-11-05 MED ORDER — ACETAMINOPHEN 650 MG RE SUPP
650.0000 mg | Freq: Once | RECTAL | Status: AC
  Administered 2013-11-06: 650 mg via RECTAL
  Filled 2013-11-05: qty 1

## 2013-11-05 MED ORDER — SODIUM CHLORIDE 0.9 % IV BOLUS (SEPSIS)
30.0000 mL/kg | Freq: Once | INTRAVENOUS | Status: AC
  Administered 2013-11-05: 2442 mL via INTRAVENOUS

## 2013-11-05 MED ORDER — VANCOMYCIN HCL IN DEXTROSE 1-5 GM/200ML-% IV SOLN
1000.0000 mg | Freq: Once | INTRAVENOUS | Status: AC
  Administered 2013-11-05: 1000 mg via INTRAVENOUS
  Filled 2013-11-05: qty 200

## 2013-11-05 MED ORDER — PIPERACILLIN-TAZOBACTAM 3.375 G IVPB
3.3750 g | Freq: Three times a day (TID) | INTRAVENOUS | Status: DC
  Administered 2013-11-06 – 2013-11-08 (×7): 3.375 g via INTRAVENOUS
  Filled 2013-11-05 (×12): qty 50

## 2013-11-05 MED ORDER — SODIUM CHLORIDE 0.9 % IV SOLN
1000.0000 mL | INTRAVENOUS | Status: DC
  Administered 2013-11-05 – 2013-11-09 (×9): 1000 mL via INTRAVENOUS

## 2013-11-05 MED ORDER — HYDROMORPHONE HCL PF 1 MG/ML IJ SOLN
1.0000 mg | Freq: Once | INTRAMUSCULAR | Status: AC
  Administered 2013-11-05: 1 mg via INTRAVENOUS
  Filled 2013-11-05: qty 1

## 2013-11-05 MED ORDER — SODIUM CHLORIDE 0.9 % IV BOLUS (SEPSIS)
1000.0000 mL | Freq: Once | INTRAVENOUS | Status: AC
  Administered 2013-11-05: 1000 mL via INTRAVENOUS

## 2013-11-05 MED ORDER — PIPERACILLIN-TAZOBACTAM 3.375 G IVPB 30 MIN
3.3750 g | Freq: Once | INTRAVENOUS | Status: AC
  Administered 2013-11-05: 3.375 g via INTRAVENOUS
  Filled 2013-11-05: qty 50

## 2013-11-05 MED ORDER — ACETAMINOPHEN 650 MG RE SUPP
650.0000 mg | Freq: Once | RECTAL | Status: AC
  Administered 2013-11-05: 650 mg via RECTAL
  Filled 2013-11-05: qty 1

## 2013-11-05 NOTE — ED Notes (Signed)
MD Tomi Bamberger notified pt's fever still 103.3 rectally. Increasingly tachypneic. Alert and follows commands.  Respiratory called to wean down FiO2 per MD. Will be at bedside promptly.
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
## 2013-11-05 NOTE — ED Notes (Signed)
Bed: RESA Expected date:  Expected time:  Means of arrival:  Comments: EMS cough/fever

## 2013-11-05 NOTE — ED Notes (Signed)
MD notified patient still extremely tachypneic and tachycardic.

## 2013-11-05 NOTE — ED Notes (Addendum)
Please call wife [name] at  Home [phone] Cell [phone]

## 2013-11-05 NOTE — ED Notes (Signed)
MD aware rectal temp 103.7.
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
## 2013-11-06 ENCOUNTER — Encounter: Payer: Self-pay | Admitting: Internal Medicine

## 2013-11-06 ENCOUNTER — Inpatient Hospital Stay (HOSPITAL_COMMUNITY): Payer: Medicare Other

## 2013-11-06 DIAGNOSIS — A419 Sepsis, unspecified organism: Secondary | ICD-10-CM | POA: Diagnosis present

## 2013-11-06 DIAGNOSIS — J9600 Acute respiratory failure, unspecified whether with hypoxia or hypercapnia: Secondary | ICD-10-CM | POA: Diagnosis present

## 2013-11-06 DIAGNOSIS — J449 Chronic obstructive pulmonary disease, unspecified: Secondary | ICD-10-CM

## 2013-11-06 DIAGNOSIS — M810 Age-related osteoporosis without current pathological fracture: Secondary | ICD-10-CM

## 2013-11-06 DIAGNOSIS — I2510 Atherosclerotic heart disease of native coronary artery without angina pectoris: Secondary | ICD-10-CM

## 2013-11-06 DIAGNOSIS — Z8673 Personal history of transient ischemic attack (TIA), and cerebral infarction without residual deficits: Secondary | ICD-10-CM

## 2013-11-06 DIAGNOSIS — B1920 Unspecified viral hepatitis C without hepatic coma: Secondary | ICD-10-CM

## 2013-11-06 DIAGNOSIS — N390 Urinary tract infection, site not specified: Secondary | ICD-10-CM

## 2013-11-06 LAB — BASIC METABOLIC PANEL
Anion gap: 15 (ref 5–15)
BUN: 30 mg/dL — AB (ref 6–23)
CO2: 18 meq/L — AB (ref 19–32)
Calcium: 8 mg/dL — ABNORMAL LOW (ref 8.4–10.5)
Chloride: 108 mEq/L (ref 96–112)
Creatinine, Ser: 1.19 mg/dL (ref 0.50–1.35)
GFR calc Af Amer: 72 mL/min — ABNORMAL LOW (ref >90)
GFR, EST NON AFRICAN AMERICAN: 62 mL/min — AB (ref >90)
Glucose, Bld: 143 mg/dL — ABNORMAL HIGH (ref 70–99)
Potassium: 3.8 mEq/L (ref 3.7–5.3)
Sodium: 141 mEq/L (ref 137–147)

## 2013-11-06 LAB — POCT I-STAT 3, ART BLOOD GAS (G3+)
Acid-base deficit: 4 mmol/L — ABNORMAL HIGH (ref 0.0–2.0)
Bicarbonate: 18.1 mEq/L — ABNORMAL LOW (ref 20.0–24.0)
O2 SAT: 100 %
PCO2 ART: 23.2 mmHg — AB (ref 35.0–45.0)
TCO2: 19 mmol/L (ref 0–100)
pH, Arterial: 7.497 — ABNORMAL HIGH (ref 7.350–7.450)
pO2, Arterial: 188 mmHg — ABNORMAL HIGH (ref 80.0–100.0)

## 2013-11-06 LAB — LACTIC ACID, PLASMA: Lactic Acid, Venous: 1.5 mmol/L (ref 0.5–2.2)

## 2013-11-06 LAB — MRSA PCR SCREENING: MRSA BY PCR: NEGATIVE

## 2013-11-06 LAB — CBC
HEMATOCRIT: 38.6 % — AB (ref 39.0–52.0)
HEMOGLOBIN: 12.5 g/dL — AB (ref 13.0–17.0)
MCH: 32.3 pg (ref 26.0–34.0)
MCHC: 32.4 g/dL (ref 30.0–36.0)
MCV: 99.7 fL (ref 78.0–100.0)
Platelets: 108 10*3/uL — ABNORMAL LOW (ref 150–400)
RBC: 3.87 MIL/uL — ABNORMAL LOW (ref 4.22–5.81)
RDW: 14.4 % (ref 11.5–15.5)
WBC: 18.4 10*3/uL — ABNORMAL HIGH (ref 4.0–10.5)

## 2013-11-06 MED ORDER — TRAMADOL HCL 50 MG PO TABS
50.0000 mg | ORAL_TABLET | Freq: Four times a day (QID) | ORAL | Status: DC | PRN
  Administered 2013-11-06 – 2013-11-08 (×2): 50 mg via ORAL
  Filled 2013-11-06 (×2): qty 1

## 2013-11-06 MED ORDER — FLUTICASONE PROPIONATE 50 MCG/ACT NA SUSP
2.0000 | Freq: Two times a day (BID) | NASAL | Status: DC
  Filled 2013-11-06: qty 16

## 2013-11-06 MED ORDER — CHLORHEXIDINE GLUCONATE 0.12 % MT SOLN
15.0000 mL | Freq: Two times a day (BID) | OROMUCOSAL | Status: DC
  Administered 2013-11-06 – 2013-11-09 (×7): 15 mL via OROMUCOSAL
  Filled 2013-11-06 (×9): qty 15

## 2013-11-06 MED ORDER — MORPHINE SULFATE 15 MG PO TABS
7.5000 mg | ORAL_TABLET | Freq: Two times a day (BID) | ORAL | Status: DC | PRN
  Administered 2013-11-06 – 2013-11-08 (×2): 15 mg via ORAL
  Filled 2013-11-06 (×2): qty 1

## 2013-11-06 MED ORDER — CLOPIDOGREL BISULFATE 75 MG PO TABS
75.0000 mg | ORAL_TABLET | Freq: Every morning | ORAL | Status: DC
  Administered 2013-11-06 – 2013-11-09 (×4): 75 mg via ORAL
  Filled 2013-11-06 (×4): qty 1

## 2013-11-06 MED ORDER — DESONIDE 0.05 % EX CREA: 1.0000 "application " | TOPICAL_CREAM | Freq: Two times a day (BID) | CUTANEOUS | Status: DC | PRN

## 2013-11-06 MED ORDER — DICLOFENAC SODIUM 75 MG PO TBEC
75.0000 mg | DELAYED_RELEASE_TABLET | Freq: Two times a day (BID) | ORAL | Status: DC
  Administered 2013-11-06 – 2013-11-09 (×7): 75 mg via ORAL
  Filled 2013-11-06 (×9): qty 1

## 2013-11-06 MED ORDER — ENOXAPARIN SODIUM 40 MG/0.4ML ~~LOC~~ SOLN
40.0000 mg | SUBCUTANEOUS | Status: DC
  Administered 2013-11-06 – 2013-11-09 (×4): 40 mg via SUBCUTANEOUS
  Filled 2013-11-06 (×4): qty 0.4

## 2013-11-06 MED ORDER — ALBUTEROL SULFATE (2.5 MG/3ML) 0.083% IN NEBU
2.5000 mg | INHALATION_SOLUTION | RESPIRATORY_TRACT | Status: DC | PRN
  Administered 2013-11-07: 2.5 mg via RESPIRATORY_TRACT
  Filled 2013-11-06: qty 3

## 2013-11-06 MED ORDER — BIOTENE DRY MOUTH MT LIQD
15.0000 mL | Freq: Four times a day (QID) | OROMUCOSAL | Status: DC
  Administered 2013-11-06 – 2013-11-08 (×10): 15 mL via OROMUCOSAL

## 2013-11-06 MED ORDER — BUDESONIDE-FORMOTEROL FUMARATE 80-4.5 MCG/ACT IN AERO
2.0000 | INHALATION_SPRAY | Freq: Two times a day (BID) | RESPIRATORY_TRACT | Status: DC
  Administered 2013-11-06 – 2013-11-09 (×7): 2 via RESPIRATORY_TRACT
  Filled 2013-11-06: qty 6.9

## 2013-11-06 MED ORDER — ADULT MULTIVITAMIN W/MINERALS CH: 1.0000 | ORAL_TABLET | Freq: Every morning | ORAL | Status: DC

## 2013-11-06 MED ORDER — SODIUM CHLORIDE 0.9 % IV BOLUS (SEPSIS)
500.0000 mL | Freq: Once | INTRAVENOUS | Status: AC
  Administered 2013-11-06: 500 mL via INTRAVENOUS

## 2013-11-06 MED ORDER — GUAIFENESIN 200 MG PO TABS
400.0000 mg | ORAL_TABLET | ORAL | Status: DC | PRN
  Filled 2013-11-06: qty 2

## 2013-11-06 MED ORDER — IPRATROPIUM-ALBUTEROL 0.5-2.5 (3) MG/3ML IN SOLN: 3.0000 mL | Freq: Four times a day (QID) | RESPIRATORY_TRACT | Status: DC | PRN

## 2013-11-06 MED ORDER — PANTOPRAZOLE SODIUM 40 MG PO TBEC
40.0000 mg | DELAYED_RELEASE_TABLET | Freq: Every day | ORAL | Status: DC
  Administered 2013-11-06 – 2013-11-09 (×4): 40 mg via ORAL
  Filled 2013-11-06 (×3): qty 1

## 2013-11-06 MED ORDER — METOPROLOL TARTRATE 12.5 MG HALF TABLET: 12.5000 mg | ORAL_TABLET | Freq: Two times a day (BID) | ORAL | Status: DC

## 2013-11-06 MED ORDER — ALBUTEROL SULFATE HFA 108 (90 BASE) MCG/ACT IN AERS: 1.0000 | INHALATION_SPRAY | RESPIRATORY_TRACT | Status: DC | PRN

## 2013-11-06 MED ORDER — POLYVINYL ALCOHOL 1.4 % OP SOLN: 1.0000 [drp] | Freq: Every day | OPHTHALMIC | Status: DC | PRN

## 2013-11-06 MED ORDER — LORATADINE 10 MG PO TABS
10.0000 mg | ORAL_TABLET | Freq: Every day | ORAL | Status: DC
Start: 2013-11-06 — End: 2013-11-06

## 2013-11-06 NOTE — Progress Notes (Signed)
Gram negative rod - blood cultures x2 results were called to Dr. Deborra Medina. Collected 7/12 @ 1909 and reported on 7/13 @ 1229. Richardean Sale, RN

## 2013-11-06 NOTE — Progress Notes (Signed)
Pt taken off BIPAP, placed on 2LNC. Pt tolerating at this time. RT Will continue to monitor.

## 2013-11-06 NOTE — Progress Notes (Signed)
Report called to Elba Barman, RN for room [id number]. Richardean Sale, RN

## 2013-11-06 NOTE — Progress Notes (Signed)
Utilization Review Completed.Lurline Caver T7/13/2015  

## 2013-11-06 NOTE — Progress Notes (Signed)
Pt is maintaing sat above 93, does not want to wear bipap. RT will continue to monitor.

## 2013-11-06 NOTE — ED Notes (Signed)
[facility] called for transport. 

## 2013-11-06 NOTE — Progress Notes (Addendum)
Approximately 2000 on 11/05/13, pt transported from ED to CT and back on vent/niv/ps 100% without incident.  No complications noted.
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
## 2013-11-06 NOTE — Progress Notes (Signed)
Called [facility] to give report to RN for room 6 and was she said she was unable to. Gave her my number as well as taking hers. Will call back in 10 min if I do not hear back before then. Richardean Sale, RN

## 2013-11-06 NOTE — Progress Notes (Signed)
Received pt on BiPAP (Large mask), and switched over to BiPAP on Servo-i.

## 2013-11-07 ENCOUNTER — Encounter (HOSPITAL_COMMUNITY): Payer: Self-pay | Admitting: Oncology

## 2013-11-07 DIAGNOSIS — R34 Anuria and oliguria: Secondary | ICD-10-CM

## 2013-11-07 DIAGNOSIS — A498 Other bacterial infections of unspecified site: Secondary | ICD-10-CM

## 2013-11-07 DIAGNOSIS — Z8619 Personal history of other infectious and parasitic diseases: Secondary | ICD-10-CM

## 2013-11-07 DIAGNOSIS — A4159 Other Gram-negative sepsis: Secondary | ICD-10-CM | POA: Diagnosis present

## 2013-11-07 HISTORY — DX: Other gram-negative sepsis: A41.59

## 2013-11-07 LAB — BASIC METABOLIC PANEL
Anion gap: 14 (ref 5–15)
BUN: 27 mg/dL — AB (ref 6–23)
CHLORIDE: 108 meq/L (ref 96–112)
CO2: 19 meq/L (ref 19–32)
Calcium: 8.4 mg/dL (ref 8.4–10.5)
Creatinine, Ser: 1.2 mg/dL (ref 0.50–1.35)
GFR calc Af Amer: 71 mL/min — ABNORMAL LOW (ref >90)
GFR calc non Af Amer: 61 mL/min — ABNORMAL LOW (ref >90)
GLUCOSE: 98 mg/dL (ref 70–99)
Potassium: 4.4 mEq/L (ref 3.7–5.3)
Sodium: 141 mEq/L (ref 137–147)

## 2013-11-07 LAB — CBC
HEMATOCRIT: 35.6 % — AB (ref 39.0–52.0)
Hemoglobin: 11.6 g/dL — ABNORMAL LOW (ref 13.0–17.0)
MCH: 32.1 pg (ref 26.0–34.0)
MCHC: 32.6 g/dL (ref 30.0–36.0)
MCV: 98.6 fL (ref 78.0–100.0)
Platelets: 97 10*3/uL — ABNORMAL LOW (ref 150–400)
RBC: 3.61 MIL/uL — AB (ref 4.22–5.81)
RDW: 14.3 % (ref 11.5–15.5)
WBC: 15.5 10*3/uL — AB (ref 4.0–10.5)

## 2013-11-07 MED ORDER — METOPROLOL TARTRATE 12.5 MG HALF TABLET
12.5000 mg | ORAL_TABLET | Freq: Two times a day (BID) | ORAL | Status: DC
  Administered 2013-11-07 – 2013-11-09 (×5): 12.5 mg via ORAL
  Filled 2013-11-07 (×6): qty 1

## 2013-11-07 MED ORDER — STARCH (THICKENING) PO POWD
ORAL | Status: DC | PRN
  Filled 2013-11-07: qty 227

## 2013-11-07 NOTE — Progress Notes (Signed)
Patient evaluated for community based chronic disease management services with [facility] as a benefit of patient's [employer]. Spoke with patient's wife at bedside to explain [facility] services.  Services accepted via written consent.  Wife is primary caregiver and would like SW assistance with finding some resources for in home personal care.  Will continue to monitor.  Wife is the primary contact due to patient's medical condition secondary to traumatic brain injury.  Patient will receive a post discharge transition of care call and will be evaluated for monthly home visits for assessments and disease process education.  Left contact information and [facility] literature at bedside. Made Inpatient Case Manager aware that [facility] following. Of note, [facility] Care Management services does not replace or interfere with any services that are arranged by inpatient case management or social work.  For additional questions or referrals please contact Corliss Blacker BSN RN [facility] Liaison at [phone].

## 2013-11-07 NOTE — Progress Notes (Signed)
Transferred patient to 6S06 per wheelchair with wife at bedside. Report given to [place], Therapist, sports.

## 2013-11-07 NOTE — Progress Notes (Signed)
Patient ID: Todd [name], male   DOB: [birth year], 68 y.o.   MRN: [id number] Attending physician note: Patient examined together with resident physician Dr. Georgia Lopes and medical student Dr. Caren Griffins. Evaluation and management plan accurate as recorded in their notes. The patient is doing dramatically better. Now on nasal cannula oxygen and out of the intensive care unit. Blood and urine cultures are growing resistant Proteus. He had a previous infection with the same organism. I would like to initiate a urologic evaluation to explore reasons why he is getting recurrent urinary tract infections.
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
## 2013-11-08 DIAGNOSIS — N179 Acute kidney failure, unspecified: Secondary | ICD-10-CM

## 2013-11-08 LAB — CBC
HEMATOCRIT: 34.7 % — AB (ref 39.0–52.0)
HEMOGLOBIN: 11.3 g/dL — AB (ref 13.0–17.0)
MCH: 32.6 pg (ref 26.0–34.0)
MCHC: 32.6 g/dL (ref 30.0–36.0)
MCV: 100 fL (ref 78.0–100.0)
Platelets: 105 10*3/uL — ABNORMAL LOW (ref 150–400)
RBC: 3.47 MIL/uL — ABNORMAL LOW (ref 4.22–5.81)
RDW: 14.2 % (ref 11.5–15.5)
WBC: 11.9 10*3/uL — ABNORMAL HIGH (ref 4.0–10.5)

## 2013-11-08 LAB — CULTURE, BLOOD (ROUTINE X 2)

## 2013-11-08 LAB — BASIC METABOLIC PANEL
Anion gap: 12 (ref 5–15)
BUN: 27 mg/dL — AB (ref 6–23)
CALCIUM: 8.3 mg/dL — AB (ref 8.4–10.5)
CO2: 21 mEq/L (ref 19–32)
CREATININE: 1.49 mg/dL — AB (ref 0.50–1.35)
Chloride: 110 mEq/L (ref 96–112)
GFR calc Af Amer: 55 mL/min — ABNORMAL LOW (ref >90)
GFR, EST NON AFRICAN AMERICAN: 47 mL/min — AB (ref >90)
GLUCOSE: 100 mg/dL — AB (ref 70–99)
POTASSIUM: 4.2 meq/L (ref 3.7–5.3)
Sodium: 143 mEq/L (ref 137–147)

## 2013-11-08 LAB — URINE CULTURE: Colony Count: >=100000

## 2013-11-08 MED ORDER — DEXTROSE 5 % IV SOLN
1.0000 g | INTRAVENOUS | Status: DC
  Administered 2013-11-08: 1 g via INTRAVENOUS
  Filled 2013-11-08 (×2): qty 10

## 2013-11-08 NOTE — Progress Notes (Signed)
PT Cancellation Note  Patient Details Name: Todd [name] MRN: [id number] DOB: [birth year]   Cancelled Treatment:    Reason Eval/Treat Not Completed: Medical issues which prohibited therapy (New bedrest orders placed 7/15 at 9:52 am. Will await bedrest orders to be discontinued prior to PT evaluation.)   Candy Sledge A 11/08/2013, 10:18 AM Candy Sledge, PT, DPT [phone]

## 2013-11-08 NOTE — Progress Notes (Signed)
Patient ID: Todd [name], male   DOB: 05/14/1945, 68 y.o.   MRN: [id number] Attending physician note: I personally examined this patient today together with resident physician Dr. Lottie Mussel and I concur with his evaluation and management plan. The patient continues to improve. Date 3 parenteral antibiotics for Proteus urosepsis. We will change to single agent ceftriaxone a stone sensitivity profile. Urine now clear. Suspect initial hematuria secondary to  traumatic Foley catheter insertion. Plan and progress to transfer to extended care facility. He will need to complete a ten-day course of antibiotics.

## 2013-11-08 NOTE — Evaluation (Signed)
Physical Therapy Evaluation Patient Details Name: Todd [name] MRN: [id number] DOB: [birth year] Today's Date: 11/08/2013   History of Present Illness  Patient is a 68 y/o male presents to ED on 7/12 with SOB, fever of 102 and tachycardia diagnosed with sepsis secondary to UTI. 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 Recently admitted in April for L1 compression fracture after a fall.    Clinical Impression  Patient presents with impairments as listed in PT problem list. Pt tolerated sitting EOB ~12 minutes with assist, mainly limited by back pain, fatigue and impaired trunk control. Pt fatigues quickly and has poor sitting tolerance at this time. Pt would benefit from skilled PT to maximize independence and ease burden of care to allow discharge to venue listed below.    Follow Up Recommendations SNF;Supervision/Assistance - 24 hour    Equipment Recommendations  None recommended by PT    Recommendations for Other Services       Precautions / Restrictions Precautions Precautions: Fall;Back Precaution Booklet Issued: No Precaution Comments: Educated pt on back precautions during transfers to reduce pain. Restrictions Weight Bearing Restrictions: No      Mobility  Bed Mobility Overal bed mobility: Needs Assistance Bed Mobility: Rolling;Supine to Sit;Sit to Supine Rolling: Min assist   Supine to sit: Mod assist;HOB elevated Sit to supine: Min guard;HOB elevated   General bed mobility comments: Instructed pt in log roll technique to protect back. Required assist with BLEs and bringing trunk upright. Able to return to supine without physical assist, required assist with repositioning in bed.  Transfers                 General transfer comment: Transfers and ambulation deferred secondary to poor sitting  balance.  Ambulation/Gait                Stairs            Wheelchair Mobility    Modified Rankin (Stroke Patients Only)       Balance Overall balance assessment: Needs assistance Sitting-balance support: Single extremity supported;Feet supported Sitting balance-Leahy Scale: Poor Sitting balance - Comments: Requires at least 1 UE for support sitting EOB, tends to lean right. Position of comfort due to back pain is right lateral lean. Able to sit EOB ~12 minutes with Min guard assist to Mod A to maintain upright. Postural control: Right lateral lean     Standing balance comment: TBA                             Pertinent Vitals/Pain Reports pain in back which is chronic. Not rated on scale. Pt repositioned for comfort. SOB present during session however able to maintain sats >95% on 3L 02 War. Pt ended session by abruptly returning to supine without warning.    Home Living Family/patient expects to be discharged to:: Private residence Living Arrangements: Spouse/significant other Available Help at Discharge: Family;Available PRN/intermittently (Aide comes in during the day.) Type of Home: Apartment Home Access: Stairs to enter Entrance Stairs-Rails: Left;Right Entrance Stairs-Number of Steps: 6 + 9 steps Home Layout: One level Home Equipment: Walker - 2 wheels;Wheelchair - manual      Prior Function Level of Independence: Needs assistance   Gait / Transfers Assistance Needed: Pt reports ambulation with RW prior to admission.  ADL's / [employer]  Needed: Aide assists with all ADLs - dressing/bathing. Wife does cooking.        Hand Dominance   Dominant Hand: Right    Extremity/Trunk Assessment   Upper Extremity Assessment: Defer to OT evaluation           Lower Extremity Assessment: Generalized weakness         Communication   Communication: Expressive difficulties  Cognition Arousal/Alertness: Awake/alert Behavior  During Therapy: WFL for tasks assessed/performed Overall Cognitive Status: No family/caregiver present to determine baseline cognitive functioning Area of Impairment:  (Pt A&O x3)                    General Comments      Exercises General Exercises - Lower Extremity Ankle Circles/Pumps: Both;10 reps;Seated Long Arc Quad: Both;5 reps;Seated Hip Flexion/Marching: Both;5 reps;Seated      Assessment/Plan    PT Assessment Patient needs continued PT services  PT Diagnosis Generalized weakness   PT Problem List Decreased strength;Cardiopulmonary status limiting activity;Pain;Decreased cognition;Impaired sensation;Decreased activity tolerance;Decreased balance;Decreased safety awareness;Decreased mobility;Decreased knowledge of precautions  PT Treatment Interventions Balance training;Gait training;Neuromuscular re-education;DME instruction;Cognitive remediation;Functional mobility training;Patient/family education;Therapeutic activities;Therapeutic exercise   PT Goals (Current goals can be found in the Care Plan section) Acute Rehab PT Goals Patient Stated Goal: go home. PT Goal Formulation: With patient Time For Goal Achievement: 11/22/13 Potential to Achieve Goals: Fair    Frequency Min 2X/week   Barriers to discharge Decreased caregiver support      Co-evaluation               End of Session Equipment Utilized During Treatment: Oxygen Activity Tolerance: Patient limited by fatigue;Patient limited by pain Patient left: in bed;with call bell/phone within reach;with bed alarm set Nurse Communication: Mobility status         Time: 9767-3419 PT Time Calculation (min): 20 min   Charges:   PT Evaluation $Initial PT Evaluation Tier I: 1 Procedure PT Treatments $Therapeutic Activity: 8-22 mins   PT G CodesCandy Sledge A 11/08/2013, 11:18 AM Candy Sledge, PT, DPT [phone]
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
## 2013-11-09 ENCOUNTER — Inpatient Hospital Stay (HOSPITAL_COMMUNITY): Payer: Medicare Other

## 2013-11-09 ENCOUNTER — Inpatient Hospital Stay
Admission: AD | Admit: 2013-11-09 | Discharge: 2013-11-30 | Disposition: A | Discharge status: Skilled Nursing Facility | Payer: Self-pay | Source: Ambulatory Visit | Attending: Internal Medicine | Admitting: Internal Medicine

## 2013-11-09 DIAGNOSIS — A419 Sepsis, unspecified organism: Secondary | ICD-10-CM

## 2013-11-09 DIAGNOSIS — B964 Proteus (mirabilis) (morganii) as the cause of diseases classified elsewhere: Secondary | ICD-10-CM

## 2013-11-09 DIAGNOSIS — D696 Thrombocytopenia, unspecified: Secondary | ICD-10-CM

## 2013-11-09 DIAGNOSIS — R6520 Severe sepsis without septic shock: Secondary | ICD-10-CM

## 2013-11-09 LAB — CBC
HCT: 32.8 % — ABNORMAL LOW (ref 39.0–52.0)
Hemoglobin: 10.6 g/dL — ABNORMAL LOW (ref 13.0–17.0)
MCH: 31.6 pg (ref 26.0–34.0)
MCHC: 32.3 g/dL (ref 30.0–36.0)
MCV: 97.9 fL (ref 78.0–100.0)
PLATELETS: 117 10*3/uL — AB (ref 150–400)
RBC: 3.35 MIL/uL — AB (ref 4.22–5.81)
RDW: 14.3 % (ref 11.5–15.5)
WBC: 9.5 10*3/uL (ref 4.0–10.5)

## 2013-11-09 LAB — BASIC METABOLIC PANEL
Anion gap: 12 (ref 5–15)
BUN: 18 mg/dL (ref 6–23)
CO2: 23 mEq/L (ref 19–32)
CREATININE: 1.43 mg/dL — AB (ref 0.50–1.35)
Calcium: 8.1 mg/dL — ABNORMAL LOW (ref 8.4–10.5)
Chloride: 113 mEq/L — ABNORMAL HIGH (ref 96–112)
GFR calc Af Amer: 57 mL/min — ABNORMAL LOW (ref >90)
GFR, EST NON AFRICAN AMERICAN: 50 mL/min — AB (ref >90)
Glucose, Bld: 87 mg/dL (ref 70–99)
POTASSIUM: 3.9 meq/L (ref 3.7–5.3)
Sodium: 148 mEq/L — ABNORMAL HIGH (ref 137–147)

## 2013-11-09 MED ORDER — AMOXICILLIN-POT CLAVULANATE 875-125 MG PO TABS: 1.0000 | ORAL_TABLET | Freq: Two times a day (BID) | ORAL | Status: DC

## 2013-11-09 MED ORDER — AMOXICILLIN-POT CLAVULANATE 875-125 MG PO TABS
1.0000 | ORAL_TABLET | Freq: Two times a day (BID) | ORAL | Status: DC
  Administered 2013-11-09: 1 via ORAL
  Filled 2013-11-09 (×2): qty 1

## 2013-11-09 MED ORDER — DEXTROSE 5 % IV SOLN: 2.0000 g | INTRAVENOUS | Status: DC

## 2013-11-09 NOTE — Progress Notes (Signed)
Pt with d[facility]. Report called. Discharge summary completed. Pt stable upon transfer, no further questions from patient or family at this time.
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
## 2013-11-09 NOTE — Progress Notes (Signed)
SLP Note  Patient Details Name: Todd [name] MRN: [id number] DOB: 07-May-1945  Received stat order for Bedside Swallow Evaluation per Lottie Mussel.  This SLP attempte to see patient at 3:25 following chat review; however, when arrived to unit patient's RN reports that patient was in the the process of being dischraged to [facility] and would be evaluated by their SLP tomorrow.  Therefore will sign off at this time and defer Bedside Evaluation to next level of care.      Gunnar Fusi, M.A., CCC-SLP [phone]  [place] 11/09/2013, 3:36 PM
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
## 2013-11-09 NOTE — Evaluation (Signed)
Occupational Therapy Evaluation Patient Details Name: Todd [name] MRN: [id number] DOB: November 02, 1945 Today's Date: 11/09/2013    History of Present Illness Patient is a 68 y/o male presents to ED on 7/12 with SOB, fever of 102 and tachycardia diagnosed with sepsis secondary to UTI. 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 Recently admitted in April for L1 compression fracture after a fall.    Clinical Impression   Pt is currently max assist for static sitting balance without UE support he also needs total assist +2 for all LB selfcare as well as toileting.  Feel he is appropriate and will need continued therapy at [facility] to progress to a level that his wife can provide safe assist and supervision at discharge.  No further acute OT needs, pt transferring to [facility] later today.    Follow Up Recommendations  LTACH    Equipment Recommendations  None recommended by OT       Precautions / Restrictions Precautions Precautions: Fall;Back Precaution Booklet Issued: No Restrictions Weight Bearing Restrictions: No      Mobility Bed Mobility Overal bed mobility: Needs Assistance Bed Mobility: Supine to Sit     Supine to sit: Min assist;HOB elevated     General bed mobility comments: Pt requiring total assist to scoot to the edge of the bed.  Transfers Overall transfer level: Needs assistance Equipment used: 1 person hand held assist Transfers: Stand Pivot Transfers   Stand pivot transfers: Total assist;+2 physical assistance;From elevated surface       General transfer comment: Pt needed 2 attempts to achieve full standing in order to step around to the bedside chair.     Balance     Sitting balance-Leahy Scale: Poor Sitting balance - Comments: Pt with increased lean and LOB to the right side and posteriorly during session.   Initially only needed min assist and then progressed to max assist after sitting EOB for 20 mins. Postural control: Posterior lean;Right lateral lean Standing balance support: Bilateral upper extremity supported Standing balance-Leahy Scale: Poor                              ADL Overall ADL's : Needs assistance/impaired Eating/Feeding: Supervision/ safety;Cueing for safety Eating/Feeding Details (indicate cue type and reason): Pt noted to have reminents of broccolli in his mouth during OT session.  Performed grooming to help eliminate. Grooming: Wash/dry face;Oral care;Minimal assistance;Sitting Grooming Details (indicate cue type and reason): supported sitting Upper Body Bathing: Minimal assitance;Sitting Upper Body Bathing Details (indicate cue type and reason): supported sitting Lower Body Bathing: +2 for physical assistance;Total assistance;Sit to/from stand   Upper Body Dressing : Moderate assistance;Sitting   Lower Body Dressing: Total assistance   Toilet Transfer: +2 for physical assistance;Total assistance   Toileting- Clothing Manipulation and Hygiene: Total assistance;+2 for physical assistance;Cueing for back precautions                  Perception Perception Perception Tested?: No   Praxis Praxis Praxis tested?: Not tested    Pertinent Vitals/Pain O2 sats 98% on room air at rest     Hand Dominance Right   Extremity/Trunk Assessment Upper Extremity Assessment Upper Extremity Assessment: Generalized weakness;RUE deficits/detail RUE Deficits / Details: Pt AROM WFLs for gross functional tasks.  Noted coordination of movements slower than normal. RUE Coordination: decreased  fine motor LUE Deficits / Details: Pt AROM WFLs for gross functional tasks.  Noted coordination of movements slower than normal. LUE Coordination: decreased fine motor   Lower Extremity Assessment Lower Extremity Assessment: Defer to PT evaluation       Communication  Communication Communication: Expressive difficulties   Cognition Arousal/Alertness: Awake/alert Behavior During Therapy: WFL for tasks assessed/performed Overall Cognitive Status: History of cognitive impairments - at baseline Area of Impairment: Memory;Safety/judgement;Awareness;Problem solving     Memory: Decreased short-term memory   Safety/Judgement: Decreased awareness of safety Awareness: Intellectual Problem Solving: Slow processing;Decreased initiation;Difficulty sequencing;Requires verbal cues;Requires tactile cues General Comments: Pt with baseline history of TBI over 20 yrs ago.  Needs max instructional cueing for safety and to sequence basic selfcare tasks.               Home Living Family/patient expects to be discharged to:: Private residence Living Arrangements: Spouse/significant other Available Help at Discharge: Family;Available PRN/intermittently (Aide comes in during the day.) Type of Home: Apartment Home Access: Stairs to enter Entrance Stairs-Number of Steps: 6 + 9 steps Entrance Stairs-Rails: Left;Right Home Layout: One level               Home Equipment: Walker - 2 wheels;Wheelchair - manual          Prior Functioning/Environment Level of Independence: Needs assistance  Gait / Transfers Assistance Needed: Pt reports ambulation with RW prior to admission. ADL's / Homemaking Assistance Needed: Aide assists with all ADLs - dressing/bathing. Wife does cooking. Communication / Swallowing Assistance Needed: limited verbal communication--hard to understand due to dysarthria and low toned voice.      OT Diagnosis: Generalized weakness;Cognitive deficits   OT Problem List: Decreased strength;Decreased activity tolerance;Impaired balance (sitting and/or standing);Decreased safety awareness;Decreased cognition;Cardiopulmonary status limiting activity;Decreased coordination;Decreased knowledge of use of DME or AE                       End of  Session Equipment Utilized During Treatment: Gait belt Nurse Communication: Mobility status  Activity Tolerance: Patient limited by fatigue Patient left: in chair;with call bell/phone within reach;with family/visitor present   Time: 1400-1445 OT Time Calculation (min): 45 min Charges:  OT General Charges $OT Visit: 1 Procedure OT Evaluation $Initial OT Evaluation Tier I: 1 Procedure OT Treatments $Self Care/Home Management : 38-52 mins  Jahad Old,Loyed OTR/L 11/09/2013, 3:03 PM

## 2013-11-10 ENCOUNTER — Other Ambulatory Visit (HOSPITAL_COMMUNITY): Payer: Self-pay

## 2013-11-10 LAB — TSH: TSH: 2.41 u[IU]/mL (ref 0.350–4.500)

## 2013-11-10 LAB — COMPREHENSIVE METABOLIC PANEL
ALBUMIN: 2.9 g/dL — AB (ref 3.5–5.2)
ALT: 21 U/L (ref 0–53)
AST: 42 U/L — AB (ref 0–37)
Alkaline Phosphatase: 66 U/L (ref 39–117)
Anion gap: 13 (ref 5–15)
BUN: 14 mg/dL (ref 6–23)
CHLORIDE: 111 meq/L (ref 96–112)
CO2: 22 mEq/L (ref 19–32)
CREATININE: 1.27 mg/dL (ref 0.50–1.35)
Calcium: 8.7 mg/dL (ref 8.4–10.5)
GFR calc Af Amer: 66 mL/min — ABNORMAL LOW (ref >90)
GFR calc non Af Amer: 57 mL/min — ABNORMAL LOW (ref >90)
Glucose, Bld: 88 mg/dL (ref 70–99)
POTASSIUM: 4 meq/L (ref 3.7–5.3)
Sodium: 146 mEq/L (ref 137–147)
Total Bilirubin: 0.6 mg/dL (ref 0.3–1.2)
Total Protein: 6.5 g/dL (ref 6.0–8.3)

## 2013-11-10 LAB — CBC WITH DIFFERENTIAL/PLATELET
Basophils Absolute: 0 10*3/uL (ref 0.0–0.1)
Basophils Relative: 0 % (ref 0–1)
Eosinophils Absolute: 0.2 10*3/uL (ref 0.0–0.7)
Eosinophils Relative: 3 % (ref 0–5)
HCT: 37 % — ABNORMAL LOW (ref 39.0–52.0)
Hemoglobin: 12 g/dL — ABNORMAL LOW (ref 13.0–17.0)
LYMPHS PCT: 13 % (ref 12–46)
Lymphs Abs: 1 10*3/uL (ref 0.7–4.0)
MCH: 32.5 pg (ref 26.0–34.0)
MCHC: 32.4 g/dL (ref 30.0–36.0)
MCV: 100.3 fL — ABNORMAL HIGH (ref 78.0–100.0)
MONO ABS: 0.9 10*3/uL (ref 0.1–1.0)
Monocytes Relative: 12 % (ref 3–12)
Neutro Abs: 5.5 10*3/uL (ref 1.7–7.7)
Neutrophils Relative %: 72 % (ref 43–77)
PLATELETS: 154 10*3/uL (ref 150–400)
RBC: 3.69 MIL/uL — ABNORMAL LOW (ref 4.22–5.81)
RDW: 14.4 % (ref 11.5–15.5)
WBC: 7.6 10*3/uL (ref 4.0–10.5)

## 2013-11-10 LAB — T4, FREE: FREE T4: 1.01 ng/dL (ref 0.80–1.80)

## 2013-11-10 LAB — VITAMIN B12: Vitamin B-12: 862 pg/mL (ref 211–911)

## 2013-11-10 LAB — PROCALCITONIN: Procalcitonin: 5.61 ng/mL

## 2013-11-10 LAB — PREALBUMIN: Prealbumin: 10 mg/dL — ABNORMAL LOW (ref 17.0–34.0)

## 2013-11-10 LAB — PHOSPHORUS: Phosphorus: 2.9 mg/dL (ref 2.3–4.6)

## 2013-11-10 LAB — FOLATE RBC: RBC Folate: 802 ng/mL — ABNORMAL HIGH (ref >280)

## 2013-11-10 LAB — MAGNESIUM: Magnesium: 2.2 mg/dL (ref 1.5–2.5)

## 2013-11-12 LAB — CBC
HEMATOCRIT: 35.5 % — AB (ref 39.0–52.0)
Hemoglobin: 11.5 g/dL — ABNORMAL LOW (ref 13.0–17.0)
MCH: 31.5 pg (ref 26.0–34.0)
MCHC: 32.4 g/dL (ref 30.0–36.0)
MCV: 97.3 fL (ref 78.0–100.0)
Platelets: 279 10*3/uL (ref 150–400)
RBC: 3.65 MIL/uL — AB (ref 4.22–5.81)
RDW: 14 % (ref 11.5–15.5)
WBC: 9.3 10*3/uL (ref 4.0–10.5)

## 2013-11-12 LAB — BASIC METABOLIC PANEL
Anion gap: 12 (ref 5–15)
BUN: 11 mg/dL (ref 6–23)
CHLORIDE: 103 meq/L (ref 96–112)
CO2: 26 meq/L (ref 19–32)
Calcium: 8.3 mg/dL — ABNORMAL LOW (ref 8.4–10.5)
Creatinine, Ser: 1.3 mg/dL (ref 0.50–1.35)
GFR calc Af Amer: 64 mL/min — ABNORMAL LOW (ref >90)
GFR calc non Af Amer: 56 mL/min — ABNORMAL LOW (ref >90)
Glucose, Bld: 97 mg/dL (ref 70–99)
Potassium: 5.2 mEq/L (ref 3.7–5.3)
SODIUM: 141 meq/L (ref 137–147)

## 2013-11-15 ENCOUNTER — Other Ambulatory Visit: Payer: Self-pay | Admitting: *Deleted

## 2013-11-15 LAB — CULTURE, BLOOD (ROUTINE X 2)
Culture: NO GROWTH
Culture: NO GROWTH

## 2013-11-16 ENCOUNTER — Other Ambulatory Visit (HOSPITAL_COMMUNITY): Payer: Medicare Other

## 2013-11-16 LAB — BASIC METABOLIC PANEL
Anion gap: 13 (ref 5–15)
BUN: 14 mg/dL (ref 6–23)
CHLORIDE: 105 meq/L (ref 96–112)
CO2: 22 mEq/L (ref 19–32)
Calcium: 9 mg/dL (ref 8.4–10.5)
Creatinine, Ser: 1.17 mg/dL (ref 0.50–1.35)
GFR calc Af Amer: 73 mL/min — ABNORMAL LOW (ref >90)
GFR calc non Af Amer: 63 mL/min — ABNORMAL LOW (ref >90)
GLUCOSE: 96 mg/dL (ref 70–99)
Potassium: 4.6 mEq/L (ref 3.7–5.3)
Sodium: 140 mEq/L (ref 137–147)

## 2013-11-16 LAB — CBC WITH DIFFERENTIAL/PLATELET
Basophils Absolute: 0.1 10*3/uL (ref 0.0–0.1)
Basophils Relative: 0 % (ref 0–1)
EOS ABS: 0.2 10*3/uL (ref 0.0–0.7)
Eosinophils Relative: 1 % (ref 0–5)
HEMATOCRIT: 41.6 % (ref 39.0–52.0)
HEMOGLOBIN: 13.6 g/dL (ref 13.0–17.0)
LYMPHS ABS: 2.1 10*3/uL (ref 0.7–4.0)
Lymphocytes Relative: 15 % (ref 12–46)
MCH: 31.9 pg (ref 26.0–34.0)
MCHC: 32.7 g/dL (ref 30.0–36.0)
MCV: 97.7 fL (ref 78.0–100.0)
MONOS PCT: 7 % (ref 3–12)
Monocytes Absolute: 0.9 10*3/uL (ref 0.1–1.0)
NEUTROS PCT: 77 % (ref 43–77)
Neutro Abs: 11.3 10*3/uL — ABNORMAL HIGH (ref 1.7–7.7)
Platelets: 377 10*3/uL (ref 150–400)
RBC: 4.26 MIL/uL (ref 4.22–5.81)
RDW: 14.2 % (ref 11.5–15.5)
WBC: 14.6 10*3/uL — ABNORMAL HIGH (ref 4.0–10.5)

## 2013-11-16 LAB — URINE MICROSCOPIC-ADD ON

## 2013-11-16 LAB — URINALYSIS, ROUTINE W REFLEX MICROSCOPIC
Bilirubin Urine: NEGATIVE
GLUCOSE, UA: NEGATIVE mg/dL
Ketones, ur: NEGATIVE mg/dL
Nitrite: NEGATIVE
Protein, ur: NEGATIVE mg/dL
Specific Gravity, Urine: 1.015 (ref 1.005–1.030)
Urobilinogen, UA: 0.2 mg/dL (ref 0.0–1.0)
pH: 8 (ref 5.0–8.0)

## 2013-11-17 LAB — BASIC METABOLIC PANEL
Anion gap: 12 (ref 5–15)
BUN: 19 mg/dL (ref 6–23)
CO2: 23 mEq/L (ref 19–32)
Calcium: 9 mg/dL (ref 8.4–10.5)
Chloride: 105 mEq/L (ref 96–112)
Creatinine, Ser: 1.22 mg/dL (ref 0.50–1.35)
GFR calc Af Amer: 70 mL/min — ABNORMAL LOW (ref >90)
GFR, EST NON AFRICAN AMERICAN: 60 mL/min — AB (ref >90)
GLUCOSE: 101 mg/dL — AB (ref 70–99)
POTASSIUM: 4.6 meq/L (ref 3.7–5.3)
Sodium: 140 mEq/L (ref 137–147)

## 2013-11-17 LAB — CBC WITH DIFFERENTIAL/PLATELET
Basophils Absolute: 0 10*3/uL (ref 0.0–0.1)
Basophils Relative: 0 % (ref 0–1)
EOS ABS: 0.2 10*3/uL (ref 0.0–0.7)
Eosinophils Relative: 2 % (ref 0–5)
HCT: 39.8 % (ref 39.0–52.0)
Hemoglobin: 13 g/dL (ref 13.0–17.0)
Lymphocytes Relative: 13 % (ref 12–46)
Lymphs Abs: 1.5 10*3/uL (ref 0.7–4.0)
MCH: 32.3 pg (ref 26.0–34.0)
MCHC: 32.7 g/dL (ref 30.0–36.0)
MCV: 99 fL (ref 78.0–100.0)
MONOS PCT: 9 % (ref 3–12)
Monocytes Absolute: 1.1 10*3/uL — ABNORMAL HIGH (ref 0.1–1.0)
Neutro Abs: 9.4 10*3/uL — ABNORMAL HIGH (ref 1.7–7.7)
Neutrophils Relative %: 76 % (ref 43–77)
Platelets: 410 10*3/uL — ABNORMAL HIGH (ref 150–400)
RBC: 4.02 MIL/uL — ABNORMAL LOW (ref 4.22–5.81)
RDW: 14.1 % (ref 11.5–15.5)
WBC: 12.2 10*3/uL — ABNORMAL HIGH (ref 4.0–10.5)

## 2013-11-17 LAB — URINE CULTURE
CULTURE: NO GROWTH
Colony Count: NO GROWTH

## 2013-11-20 LAB — COMPREHENSIVE METABOLIC PANEL
ALT: 11 U/L (ref 0–53)
ANION GAP: 13 (ref 5–15)
AST: 30 U/L (ref 0–37)
Albumin: 3.1 g/dL — ABNORMAL LOW (ref 3.5–5.2)
Alkaline Phosphatase: 55 U/L (ref 39–117)
BUN: 20 mg/dL (ref 6–23)
CALCIUM: 9.2 mg/dL (ref 8.4–10.5)
CO2: 25 mEq/L (ref 19–32)
CREATININE: 1.14 mg/dL (ref 0.50–1.35)
Chloride: 100 mEq/L (ref 96–112)
GFR calc Af Amer: 76 mL/min — ABNORMAL LOW (ref >90)
GFR, EST NON AFRICAN AMERICAN: 65 mL/min — AB (ref >90)
GLUCOSE: 94 mg/dL (ref 70–99)
Potassium: 4.7 mEq/L (ref 3.7–5.3)
SODIUM: 138 meq/L (ref 137–147)
TOTAL PROTEIN: 7.7 g/dL (ref 6.0–8.3)
Total Bilirubin: 0.5 mg/dL (ref 0.3–1.2)

## 2013-11-20 LAB — CBC WITH DIFFERENTIAL/PLATELET
Basophils Absolute: 0 10*3/uL (ref 0.0–0.1)
Basophils Relative: 1 % (ref 0–1)
EOS ABS: 0.2 10*3/uL (ref 0.0–0.7)
EOS PCT: 2 % (ref 0–5)
HEMATOCRIT: 42.1 % (ref 39.0–52.0)
Hemoglobin: 13.4 g/dL (ref 13.0–17.0)
LYMPHS ABS: 2.3 10*3/uL (ref 0.7–4.0)
Lymphocytes Relative: 29 % (ref 12–46)
MCH: 31.3 pg (ref 26.0–34.0)
MCHC: 31.8 g/dL (ref 30.0–36.0)
MCV: 98.4 fL (ref 78.0–100.0)
MONO ABS: 0.9 10*3/uL (ref 0.1–1.0)
Monocytes Relative: 11 % (ref 3–12)
Neutro Abs: 4.6 10*3/uL (ref 1.7–7.7)
Neutrophils Relative %: 57 % (ref 43–77)
Platelets: 403 10*3/uL — ABNORMAL HIGH (ref 150–400)
RBC: 4.28 MIL/uL (ref 4.22–5.81)
RDW: 13.4 % (ref 11.5–15.5)
WBC: 8 10*3/uL (ref 4.0–10.5)

## 2013-11-20 LAB — PREALBUMIN: PREALBUMIN: 18.6 mg/dL (ref 17.0–34.0)

## 2013-11-21 LAB — CULTURE, BLOOD (ROUTINE X 2)
CULTURE: NO GROWTH
Culture: NO GROWTH

## 2013-11-21 NOTE — Addendum Note (Signed)
Addended by: Truddie Crumble on: 11/21/2013 12:05 PM   Modules accepted: Orders

## 2013-11-23 ENCOUNTER — Ambulatory Visit: Payer: Medicare Other | Admitting: Internal Medicine

## 2013-11-24 LAB — CBC
HEMATOCRIT: 40.2 % (ref 39.0–52.0)
HEMOGLOBIN: 12.9 g/dL — AB (ref 13.0–17.0)
MCH: 31.7 pg (ref 26.0–34.0)
MCHC: 32.1 g/dL (ref 30.0–36.0)
MCV: 98.8 fL (ref 78.0–100.0)
Platelets: 446 10*3/uL — ABNORMAL HIGH (ref 150–400)
RBC: 4.07 MIL/uL — AB (ref 4.22–5.81)
RDW: 13.6 % (ref 11.5–15.5)
WBC: 7.1 10*3/uL (ref 4.0–10.5)

## 2013-11-24 LAB — BASIC METABOLIC PANEL
Anion gap: 11 (ref 5–15)
BUN: 21 mg/dL (ref 6–23)
CO2: 22 meq/L (ref 19–32)
Calcium: 8.9 mg/dL (ref 8.4–10.5)
Chloride: 107 mEq/L (ref 96–112)
Creatinine, Ser: 1.06 mg/dL (ref 0.50–1.35)
GFR calc Af Amer: 83 mL/min — ABNORMAL LOW (ref >90)
GFR, EST NON AFRICAN AMERICAN: 71 mL/min — AB (ref >90)
GLUCOSE: 94 mg/dL (ref 70–99)
POTASSIUM: 4.3 meq/L (ref 3.7–5.3)
Sodium: 140 mEq/L (ref 137–147)

## 2013-11-25 LAB — URINALYSIS, ROUTINE W REFLEX MICROSCOPIC
Bilirubin Urine: NEGATIVE
GLUCOSE, UA: NEGATIVE mg/dL
HGB URINE DIPSTICK: NEGATIVE
Ketones, ur: NEGATIVE mg/dL
Nitrite: NEGATIVE
PROTEIN: NEGATIVE mg/dL
Specific Gravity, Urine: 1.015 (ref 1.005–1.030)
Urobilinogen, UA: 0.2 mg/dL (ref 0.0–1.0)
pH: 7.5 (ref 5.0–8.0)

## 2013-11-25 LAB — URINE MICROSCOPIC-ADD ON

## 2013-11-26 LAB — URINE CULTURE
Colony Count: NO GROWTH
Culture: NO GROWTH
SPECIAL REQUESTS: NORMAL

## 2013-11-27 LAB — COMPREHENSIVE METABOLIC PANEL
ALK PHOS: 55 U/L (ref 39–117)
ALT: 14 U/L (ref 0–53)
AST: 26 U/L (ref 0–37)
Albumin: 3.3 g/dL — ABNORMAL LOW (ref 3.5–5.2)
Anion gap: 12 (ref 5–15)
BUN: 19 mg/dL (ref 6–23)
CO2: 25 mEq/L (ref 19–32)
Calcium: 9.3 mg/dL (ref 8.4–10.5)
Chloride: 106 mEq/L (ref 96–112)
Creatinine, Ser: 1.07 mg/dL (ref 0.50–1.35)
GFR calc non Af Amer: 70 mL/min — ABNORMAL LOW (ref >90)
GFR, EST AFRICAN AMERICAN: 82 mL/min — AB (ref >90)
GLUCOSE: 96 mg/dL (ref 70–99)
POTASSIUM: 4.1 meq/L (ref 3.7–5.3)
Sodium: 143 mEq/L (ref 137–147)
TOTAL PROTEIN: 7.7 g/dL (ref 6.0–8.3)
Total Bilirubin: 0.5 mg/dL (ref 0.3–1.2)

## 2013-11-27 LAB — CBC WITH DIFFERENTIAL/PLATELET
BASOS PCT: 1 % (ref 0–1)
Basophils Absolute: 0.1 10*3/uL (ref 0.0–0.1)
EOS ABS: 0.3 10*3/uL (ref 0.0–0.7)
Eosinophils Relative: 3 % (ref 0–5)
HCT: 41.4 % (ref 39.0–52.0)
HEMOGLOBIN: 13.6 g/dL (ref 13.0–17.0)
LYMPHS ABS: 2.3 10*3/uL (ref 0.7–4.0)
Lymphocytes Relative: 27 % (ref 12–46)
MCH: 32.4 pg (ref 26.0–34.0)
MCHC: 32.9 g/dL (ref 30.0–36.0)
MCV: 98.6 fL (ref 78.0–100.0)
MONO ABS: 0.8 10*3/uL (ref 0.1–1.0)
MONOS PCT: 10 % (ref 3–12)
Neutro Abs: 5 10*3/uL (ref 1.7–7.7)
Neutrophils Relative %: 59 % (ref 43–77)
Platelets: 377 10*3/uL (ref 150–400)
RBC: 4.2 MIL/uL — ABNORMAL LOW (ref 4.22–5.81)
RDW: 13.8 % (ref 11.5–15.5)
WBC: 8.5 10*3/uL (ref 4.0–10.5)

## 2013-11-27 LAB — PHOSPHORUS: Phosphorus: 3.3 mg/dL (ref 2.3–4.6)

## 2013-11-27 LAB — MAGNESIUM: MAGNESIUM: 2.1 mg/dL (ref 1.5–2.5)

## 2013-11-27 LAB — PREALBUMIN: PREALBUMIN: 22.7 mg/dL (ref 17.0–34.0)

## 2013-11-30 LAB — BASIC METABOLIC PANEL
ANION GAP: 13 (ref 5–15)
BUN: 17 mg/dL (ref 6–23)
CO2: 24 mEq/L (ref 19–32)
Calcium: 9.2 mg/dL (ref 8.4–10.5)
Chloride: 105 mEq/L (ref 96–112)
Creatinine, Ser: 1.12 mg/dL (ref 0.50–1.35)
GFR calc non Af Amer: 67 mL/min — ABNORMAL LOW (ref >90)
GFR, EST AFRICAN AMERICAN: 77 mL/min — AB (ref >90)
Glucose, Bld: 139 mg/dL — ABNORMAL HIGH (ref 70–99)
Potassium: 4.5 mEq/L (ref 3.7–5.3)
Sodium: 142 mEq/L (ref 137–147)

## 2013-11-30 LAB — CBC
HEMATOCRIT: 43.7 % (ref 39.0–52.0)
Hemoglobin: 14.4 g/dL (ref 13.0–17.0)
MCH: 32.9 pg (ref 26.0–34.0)
MCHC: 33 g/dL (ref 30.0–36.0)
MCV: 99.8 fL (ref 78.0–100.0)
PLATELETS: 241 10*3/uL (ref 150–400)
RBC: 4.38 MIL/uL (ref 4.22–5.81)
RDW: 13.7 % (ref 11.5–15.5)
WBC: 7.9 10*3/uL (ref 4.0–10.5)

## 2013-12-05 ENCOUNTER — Encounter: Payer: Self-pay | Admitting: Internal Medicine

## 2013-12-05 NOTE — Progress Notes (Deleted)
Patient ID: Todd [name], male   DOB: 20-Mar-1946, 68 y.o.   MRN: [id number]  Location:  [facility] Provider:  Jonelle Sidle L. Mariea Clonts, D.O., C.M.D.   Code Status:  FULL  Chief Complaint  Patient presents with  . Hospitalization Follow-up    new admission     HPI:  *** Review of Systems:  *** Medications: [name] Medications  New Prescriptions   No medications on file  Previous Medications   BUDESONIDE-FORMOTEROL (SYMBICORT) 80-4.5 MCG/ACT INHALER    Inhale 2 puffs into the lungs 2 (two) times daily.   CEFTRIAXONE 2 G IN DEXTROSE 5 % 50 ML    Inject 2 g into the vein daily.   CETIRIZINE (ZYRTEC) 5 MG TABLET    Take 1 tablet (5 mg total) by mouth daily as needed for allergies.   CLOPIDOGREL (PLAVIX) 75 MG TABLET    Take 1 tablet (75 mg total) by mouth every morning.   DULOXETINE (CYMBALTA) 60 MG CAPSULE    Take 60 mg by mouth daily.   ENOXAPARIN (LOVENOX) 40 MG/0.4ML INJECTION    Inject 40 mg into the skin daily.   FLUTICASONE FUROATE-VILANTEROL (BREO ELLIPTA) 100-25 MCG/INH AEPB    Inhale 1 puff into the lungs daily. For acute respiratory failure   GUAIFENESIN (ROBITUSSIN) 100 MG/5ML LIQUID    Take 30 mLs by mouth 3 (three) times daily as needed for cough.   LACTOBACILLUS (FLORANEX/LACTINEX) PACK    Take 1 g by mouth 2 (two) times daily.   LIDOCAINE (LIDODERM) 5 %    Place 1 patch onto the skin daily. Remove & Discard patch within 12 hours or as directed by MD   METOPROLOL TARTRATE (LOPRESSOR) 25 MG TABLET    Take 12.5 mg by mouth 2 (two) times daily.   MULTIPLE VITAMIN (MULTIVITAMIN WITH MINERALS) TABS TABLET    Take 1 tablet by mouth every morning.   PANTOPRAZOLE (PROTONIX) 40 MG TABLET    Take 40 mg by mouth daily.  Modified Medications   No medications on file  Discontinued Medications   DESONIDE (DESOWEN) 0.05 % CREAM    Apply 1 application topically 2 (two) times daily as needed (For rash.).   DICLOFENAC (VOLTAREN) 75 MG EC TABLET    Take 75 mg by mouth 2  (two) times daily.   FLUTICASONE (FLONASE) 50 MCG/ACT NASAL SPRAY    Place 2 sprays into both nostrils 2 (two) times daily.   GUAIFENESIN (HUMIBID E) 400 MG TABS TABLET    Take 400 mg by mouth every 4 (four) hours as needed. For cough   IPRATROPIUM-ALBUTEROL (DUONEB) 0.5-2.5 (3) MG/3ML SOLN    Take 3 mLs by nebulization every 6 (six) hours as needed. For wheezing   MORPHINE (MSIR) 15 MG TABLET    Take half a tablet to one tablet twice daily as needed pain.   OMEPRAZOLE (PRILOSEC) 20 MG CAPSULE    Take 20 mg by mouth daily.   POLYVINYL ALCOHOL (LIQUIFILM TEARS) 1.4 % OPHTHALMIC SOLUTION    Place 1 drop into both eyes daily as needed for dry eyes.   PROAIR HFA 108 (90 BASE) MCG/ACT INHALER    inhale 2 puffs INTO THE LUNGS by mouth every 6 hours if needed for wheezing   TRAMADOL (ULTRAM) 50 MG TABLET    Take 50 mg by mouth every 6 (six) hours as needed for moderate pain.    Physical Exam: Filed Vitals:   12/05/13 1226  BP: 123/56  Pulse: 73  Temp:  97.1 F (36.2 C)  Resp: 18  Height: 6' (1.829 m)  Weight: 176 lb (79.833 kg)  SpO2: 95%   *** (delete stars and type .physexam)  Labs reviewed: Basic Metabolic Panel:  Recent Labs  05/05/13 0809  11/09/13 0659 11/10/13 0634  11/24/13 0530 11/27/13 0528 11/30/13 0642  NA  --   < > 148* 146  < > 140 143 142  K  --   < > 3.9 4.0  < > 4.3 4.1 4.5  CL  --   < > 113* 111  < > 107 106 105  CO2  --   < > 23 22  < > 22 25 24   GLUCOSE  --   < > 87 88  < > 94 96 139*  BUN  --   < > 18 14  < > 21 19 17   CREATININE  --   < > 1.43* 1.27  < > 1.06 1.07 1.12  CALCIUM  --   < > 8.1* 8.7  < > 8.9 9.3 9.2  MG 2.0  --   --  2.2  --   --  2.1  --   PHOS  --   --   --  2.9  --   --  3.3  --   < > = values in this interval not displayed.  Liver Function Tests:  Recent Labs  11/10/13 0634 11/20/13 0522 11/27/13 0528  AST 42* 30 26  ALT 21 11 14   ALKPHOS 66 55 55  BILITOT 0.6 0.5 0.5  PROT 6.5 7.7 7.7  ALBUMIN 2.9* 3.1* 3.3*     CBC:  Recent Labs  11/17/13 0540 11/20/13 0522 11/24/13 0530 11/27/13 0528 11/30/13 0642  WBC 12.2* 8.0 7.1 8.5 7.9  NEUTROABS 9.4* 4.6  --  5.0  --   HGB 13.0 13.4 12.9* 13.6 14.4  HCT 39.8 42.1 40.2 41.4 43.7  MCV 99.0 98.4 98.8 98.6 99.8  PLT 410* 403* 446* 377 241    Significant Diagnostic Results: ***  Assessment/Plan No problem-specific assessment & plan notes found for this encounter.   Family/ staff Communication: ***  Goals of care: ***  Labs/tests ordered:  ***

## 2013-12-07 ENCOUNTER — Other Ambulatory Visit: Payer: Self-pay | Admitting: *Deleted

## 2013-12-07 MED ORDER — DULOXETINE HCL 60 MG PO CPEP: 60.0000 mg | ORAL_CAPSULE | Freq: Every day | ORAL | Status: DC

## 2013-12-08 ENCOUNTER — Encounter: Payer: Self-pay | Admitting: Internal Medicine

## 2013-12-08 ENCOUNTER — Non-Acute Institutional Stay (SKILLED_NURSING_FACILITY): Payer: Medicare Other | Admitting: Internal Medicine

## 2013-12-08 DIAGNOSIS — R5381 Other malaise: Secondary | ICD-10-CM

## 2013-12-08 DIAGNOSIS — R1319 Other dysphagia: Secondary | ICD-10-CM

## 2013-12-08 DIAGNOSIS — G8929 Other chronic pain: Secondary | ICD-10-CM

## 2013-12-08 DIAGNOSIS — R1310 Dysphagia, unspecified: Secondary | ICD-10-CM

## 2013-12-08 DIAGNOSIS — J449 Chronic obstructive pulmonary disease, unspecified: Secondary | ICD-10-CM

## 2013-12-08 DIAGNOSIS — M549 Dorsalgia, unspecified: Secondary | ICD-10-CM

## 2013-12-08 DIAGNOSIS — I2510 Atherosclerotic heart disease of native coronary artery without angina pectoris: Secondary | ICD-10-CM

## 2013-12-08 DIAGNOSIS — R05 Cough: Secondary | ICD-10-CM

## 2013-12-08 DIAGNOSIS — R059 Cough, unspecified: Secondary | ICD-10-CM

## 2013-12-08 DIAGNOSIS — K279 Peptic ulcer, site unspecified, unspecified as acute or chronic, without hemorrhage or perforation: Secondary | ICD-10-CM

## 2013-12-08 DIAGNOSIS — N390 Urinary tract infection, site not specified: Secondary | ICD-10-CM

## 2013-12-08 DIAGNOSIS — T83511S Infection and inflammatory reaction due to indwelling urethral catheter, sequela: Secondary | ICD-10-CM

## 2013-12-08 DIAGNOSIS — I209 Angina pectoris, unspecified: Secondary | ICD-10-CM

## 2013-12-08 DIAGNOSIS — E46 Unspecified protein-calorie malnutrition: Secondary | ICD-10-CM

## 2013-12-08 DIAGNOSIS — I25119 Atherosclerotic heart disease of native coronary artery with unspecified angina pectoris: Secondary | ICD-10-CM

## 2013-12-08 DIAGNOSIS — T83511A Infection and inflammatory reaction due to indwelling urethral catheter, initial encounter: Secondary | ICD-10-CM

## 2013-12-08 DIAGNOSIS — F322 Major depressive disorder, single episode, severe without psychotic features: Secondary | ICD-10-CM
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
## 2013-12-11 LAB — BASIC METABOLIC PANEL
BUN: 17 mg/dL (ref 4–21)
CREATININE: 1 mg/dL (ref 0.6–1.3)
Glucose: 68 mg/dL
Potassium: 4 mmol/L (ref 3.4–5.3)
Sodium: 142 mmol/L (ref 137–147)

## 2013-12-11 LAB — CBC AND DIFFERENTIAL
HCT: 41 % (ref 41–53)
Hemoglobin: 13.3 g/dL — AB (ref 13.5–17.5)
Platelets: 228 10*3/uL (ref 150–399)
WBC: 10.8 10^3/mL

## 2013-12-11 LAB — HEPATIC FUNCTION PANEL
ALK PHOS: 51 U/L (ref 25–125)
ALT: 11 U/L (ref 10–40)
AST: 31 U/L (ref 14–40)
Bilirubin, Total: 0.6 mg/dL

## 2013-12-12 ENCOUNTER — Other Ambulatory Visit: Payer: Self-pay | Admitting: *Deleted

## 2013-12-16 NOTE — Progress Notes (Signed)
This encounter was created in error - please disregard.

## 2013-12-19 ENCOUNTER — Non-Acute Institutional Stay (SKILLED_NURSING_FACILITY): Payer: Medicare Other | Admitting: Internal Medicine

## 2013-12-19 ENCOUNTER — Encounter: Payer: Self-pay | Admitting: Internal Medicine

## 2013-12-19 DIAGNOSIS — I69991 Dysphagia following unspecified cerebrovascular disease: Secondary | ICD-10-CM

## 2013-12-19 DIAGNOSIS — R531 Weakness: Secondary | ICD-10-CM

## 2013-12-19 DIAGNOSIS — R5383 Other fatigue: Secondary | ICD-10-CM

## 2013-12-19 DIAGNOSIS — R5381 Other malaise: Secondary | ICD-10-CM

## 2013-12-19 DIAGNOSIS — I6990 Unspecified sequelae of unspecified cerebrovascular disease: Secondary | ICD-10-CM

## 2013-12-19 DIAGNOSIS — A419 Sepsis, unspecified organism: Secondary | ICD-10-CM

## 2013-12-19 DIAGNOSIS — N390 Urinary tract infection, site not specified: Secondary | ICD-10-CM
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
## 2013-12-29 DIAGNOSIS — I69959 Hemiplegia and hemiparesis following unspecified cerebrovascular disease affecting unspecified side: Secondary | ICD-10-CM

## 2013-12-29 DIAGNOSIS — S069X9S Unspecified intracranial injury with loss of consciousness of unspecified duration, sequela: Secondary | ICD-10-CM

## 2013-12-29 DIAGNOSIS — I69991 Dysphagia following unspecified cerebrovascular disease: Secondary | ICD-10-CM

## 2013-12-29 DIAGNOSIS — S069XAS Unspecified intracranial injury with loss of consciousness status unknown, sequela: Secondary | ICD-10-CM

## 2013-12-29 DIAGNOSIS — I69920 Aphasia following unspecified cerebrovascular disease: Secondary | ICD-10-CM

## 2014-01-03 ENCOUNTER — Ambulatory Visit: Payer: Medicare Other | Admitting: Internal Medicine

## 2014-01-05 ENCOUNTER — Encounter: Payer: Self-pay | Admitting: Internal Medicine

## 2014-01-05 ENCOUNTER — Ambulatory Visit (INDEPENDENT_AMBULATORY_CARE_PROVIDER_SITE_OTHER): Payer: Medicare Other | Admitting: Internal Medicine

## 2014-01-05 VITALS — BP 100/69 | HR 85 | Temp 97.3°F | Ht 72.0 in | Wt 182.2 lb

## 2014-01-05 DIAGNOSIS — M549 Dorsalgia, unspecified: Secondary | ICD-10-CM

## 2014-01-05 DIAGNOSIS — M8448XD Pathological fracture, other site, subsequent encounter for fracture with routine healing: Secondary | ICD-10-CM

## 2014-01-05 DIAGNOSIS — J4489 Other specified chronic obstructive pulmonary disease: Secondary | ICD-10-CM

## 2014-01-05 DIAGNOSIS — M8080XD Other osteoporosis with current pathological fracture, unspecified site, subsequent encounter for fracture with routine healing: Secondary | ICD-10-CM

## 2014-01-05 DIAGNOSIS — J449 Chronic obstructive pulmonary disease, unspecified: Secondary | ICD-10-CM

## 2014-01-05 DIAGNOSIS — IMO0001 Reserved for inherently not codable concepts without codable children: Secondary | ICD-10-CM

## 2014-01-05 DIAGNOSIS — R32 Unspecified urinary incontinence: Secondary | ICD-10-CM

## 2014-01-05 DIAGNOSIS — G8929 Other chronic pain: Secondary | ICD-10-CM

## 2014-01-05 MED ORDER — BUDESONIDE-FORMOTEROL FUMARATE 160-4.5 MCG/ACT IN AERO: 2.0000 | INHALATION_SPRAY | Freq: Two times a day (BID) | RESPIRATORY_TRACT | Status: DC

## 2014-01-05 MED ORDER — MORPHINE SULFATE ER 15 MG PO TBCR: 15.0000 mg | EXTENDED_RELEASE_TABLET | Freq: Two times a day (BID) | ORAL | Status: DC

## 2014-01-05 MED ORDER — LIDOCAINE 5 % EX PTCH
1.0000 | MEDICATED_PATCH | CUTANEOUS | Status: DC
Start: 2014-01-05 — End: 2014-01-10

## 2014-01-05 NOTE — Patient Instructions (Addendum)
-  Start using Symbicort instead of Breo. -Follow up with Dr. Matilde Sprang in Urology, please call to get an earlier appointment.  -Make an appointment to see Dr. Dareen Piano, your new primary care doctor as soon as possible-- he has openings on October 1st and October 15th.

## 2014-01-05 NOTE — Progress Notes (Signed)
Patient ID: Todd [name], male   DOB: Sep 23, 1945, 68 y.o.   MRN: [id number] Case discussed with Dr. Hayes Ludwig soon after the resident saw the patient.  We reviewed the resident's history and exam and pertinent patient test results.  I agree with the assessment, diagnosis, and plan of care documented in the resident's note.

## 2014-01-08 NOTE — Assessment & Plan Note (Signed)
Refilled lidocaine 5% patch.  Rx morphine 15mg  BID PRN pain #60.  Pt's wife advised to return to [facility] with pt's PCP for possible medication contract and long-term pain medication tx. She verbalized understanding and agreed with this plan.

## 2014-01-08 NOTE — Assessment & Plan Note (Signed)
He has chronic indwelling catheter. He will follow up with his Urologist, Dr. Matilde Sprang in two weeks.
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
## 2014-01-08 NOTE — Assessment & Plan Note (Signed)
Changed BREO to symbicort as the pt's wife finds the latter easier to administer.

## 2014-01-10 ENCOUNTER — Telehealth: Payer: Self-pay | Admitting: *Deleted

## 2014-01-10 ENCOUNTER — Ambulatory Visit (INDEPENDENT_AMBULATORY_CARE_PROVIDER_SITE_OTHER): Payer: Medicare Other | Admitting: Internal Medicine

## 2014-01-10 ENCOUNTER — Encounter: Payer: Self-pay | Admitting: Internal Medicine

## 2014-01-10 VITALS — BP 123/67 | HR 79 | Temp 98.4°F | Ht 72.0 in | Wt 180.2 lb

## 2014-01-10 DIAGNOSIS — G8929 Other chronic pain: Secondary | ICD-10-CM

## 2014-01-10 DIAGNOSIS — L720 Epidermal cyst: Secondary | ICD-10-CM | POA: Insufficient documentation

## 2014-01-10 DIAGNOSIS — M549 Dorsalgia, unspecified: Secondary | ICD-10-CM

## 2014-01-10 DIAGNOSIS — L723 Sebaceous cyst: Secondary | ICD-10-CM

## 2014-01-10 MED ORDER — CIPROFLOXACIN HCL 500 MG PO TABS: 500.0000 mg | ORAL_TABLET | Freq: Two times a day (BID) | ORAL | Status: AC

## 2014-01-10 MED ORDER — LIDOCAINE 5 % EX PTCH: 1.0000 | MEDICATED_PATCH | CUTANEOUS | Status: DC
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
## 2014-01-10 NOTE — Assessment & Plan Note (Addendum)
The patient has had similar cysts in the past which were treated by his Dermatologist, Dr. Allyson Sabal.  The cyst does not appear infected today but will Rx ciprofloxacin 500mg  BID for 5 days (pt has 2 more days of this med Rx by his Urologist)  -referred to Dermatology, he has appointment on Tuesday with Dr. Allyson Sabal

## 2014-01-10 NOTE — Assessment & Plan Note (Signed)
His wife states that for some reason their Pharmacy did not have the Rx for lidocaine patch that was sent electronically last week.  Rx lidocaine 5% patch

## 2014-01-10 NOTE — Patient Instructions (Signed)
-  You have an appointment with Dr. Allyson Sabal on Tuesday for the cysts on your back.  -Take ciprofloxacin 500mg  twice per day for 5 more days after you finish your current dose.   Thank you for bringing your medicines today. This helps [place] keep you safe from mistakes.

## 2014-01-10 NOTE — Telephone Encounter (Signed)
Call from [place], [place] with [facility] [phone]  Nurse reports a new inflammation on back.  Wife wants him to be seen for this. Pt does have hx of cyst on back. Wife,  Marcelline Mates #  [phone]  Will see today

## 2014-01-11 NOTE — Progress Notes (Signed)
   Subjective:    Patient ID: Todd [name], male    DOB: 07/20/1945, 68 y.o.   MRN: [id number]  HPI    Review of Systems     Objective:   Physical Exam  Skin:  Sebaceous cyst on left shoulder ~3cm in diameter with gray discharge, firm, with no surrounding erythema or edema.  Firm sebaceous cyst on left up mid back ~5cm in diameter with no fluctuance, no erythema, no edema, mildly TTP, does not appear infected.           Assessment & Plan:

## 2014-01-11 NOTE — Progress Notes (Signed)
Medicine attending: I personally interviewed and examined this patient along with resident physician Dr. Hayes Ludwig. I am familiar with this gentleman from recent hospital admission. He has developed large recurrent sebaceous cysts on his upper back. He is currently taking Cipro to prevent recurrent urinary tract infections and has a Foley catheter in place. The lesions on his back do not appear to be infected but they do need surgical attention. He is scheduled to see his dermatologist as an outpatient next week. If lesions are too large to be managed by dermatology, I recommend referral to general surgery. He is advised to continue the Cipro pending further interventions. Murriel Hopper, M.D., [facility]

## 2014-01-15 ENCOUNTER — Telehealth: Payer: Self-pay | Admitting: *Deleted

## 2014-01-15 NOTE — Telephone Encounter (Signed)
Pt's wife called and states very angrily that she cannot get his lidocaine patches that were ordered because" you all dropped the ball again", i explained that insurance was refusing to pay for the patches and i had called the pharm, gotten them to fax the PA info and it may take up to 7 to 10 days for this, she states her pharmacist and good friend sent the request numerous times starting 1 month ago and "you all ignored it" she states she knows it came because the "white haired doctor said it came across his desk" , i have again informed her that the PA has been sent today by the pharmacy and will be done asap

## 2014-01-17 NOTE — Telephone Encounter (Addendum)
Contacted pt's insurance at [phone] to initiate PA request.  Since medication was being prescribed for chronic back pain and not "Post-herpetic neuralgia", request was sent for review.  [facility] was also informed that pt unable to take codeine,  NSAIDS, and tylenol. Pt has also used tramadol.  Request make take up to 72 hours .Despina Hidden Cassady9/23/20159:48 AM      [id number] Pt ID# [id number] Case# [id number]

## 2014-01-18 NOTE — Telephone Encounter (Signed)
Received faxed confirmation that stated the following "Lidocaine patch is denied because the use is not supported by the FDA or one of the medicare approved references for treating your medical conditions: unspecified backache, other chronic pain, closed fracture of lumbar vertebra without mention of spinal cord injury, personal history of traumatic brain injury."  Will forward info to pcp and/or prescribing MD to see what other options are available.Despina Hidden Cassady9/24/20151:31 PM

## 2014-01-19 ENCOUNTER — Other Ambulatory Visit: Payer: Self-pay | Admitting: Internal Medicine

## 2014-01-19 ENCOUNTER — Telehealth: Payer: Self-pay | Admitting: *Deleted

## 2014-01-19 NOTE — Telephone Encounter (Signed)
I have discussed the case with Freddy Finner RN, and Meyer Russel. Pt is not a candidate for voltaren gel given contraindication to NSAID use and insurance did not approve the lidocaine patches. As per Bonnita Nasuti pt's wife does not want pt on Morphine as it is sedating to him. Given this I would avoid opiates. I would advise OTC capsaicin cream at this time. Side effects would include rash and burning but may improve pain. [name] will call patient's wife and inform her

## 2014-01-19 NOTE — Telephone Encounter (Signed)
Spoke with Ms. Todd [name] for an extended period of time and she just felt as if she needed to inform that she knew her sig. Other better than anyone else. She appreciates the depth of knowledge of the lady she spoke to on the phone and feels they "shook hands" over the phone, and looks forward to seeing Dr. Dareen Piano although morning clinics will be an additional stress on her to get "[name]" ready and into clinic. She acknowledged the recommendation for capsaicin cream and I explained not to place on lesions and potential reactions. She will try it an thanks [place].

## 2014-01-22 NOTE — Telephone Encounter (Signed)
Thank you [name].

## 2014-02-12 ENCOUNTER — Other Ambulatory Visit: Payer: Self-pay | Admitting: Internal Medicine

## 2014-02-12 ENCOUNTER — Telehealth: Payer: Self-pay | Admitting: *Deleted

## 2014-02-12 NOTE — Telephone Encounter (Signed)
That would be good. Thank you Edd Fabian

## 2014-02-12 NOTE — Telephone Encounter (Signed)
Call from [place] Franco Therapist with [facility] - #  [phone]  She is asking for a verbal order to continue seeing pt  Twice a week for 2 weeks for Home OT  I gave the verbal order, is that okay with you?

## 2014-03-01 ENCOUNTER — Ambulatory Visit (INDEPENDENT_AMBULATORY_CARE_PROVIDER_SITE_OTHER): Payer: Medicare Other | Admitting: Internal Medicine

## 2014-03-01 ENCOUNTER — Encounter: Payer: Self-pay | Admitting: Internal Medicine

## 2014-03-01 VITALS — BP 115/84 | HR 143 | Temp 97.8°F | Ht 72.0 in | Wt 183.5 lb

## 2014-03-01 DIAGNOSIS — Z23 Encounter for immunization: Secondary | ICD-10-CM

## 2014-03-01 DIAGNOSIS — T17908D Unspecified foreign body in respiratory tract, part unspecified causing other injury, subsequent encounter: Secondary | ICD-10-CM

## 2014-03-01 DIAGNOSIS — I2510 Atherosclerotic heart disease of native coronary artery without angina pectoris: Secondary | ICD-10-CM

## 2014-03-01 DIAGNOSIS — Z418 Encounter for other procedures for purposes other than remedying health state: Secondary | ICD-10-CM

## 2014-03-01 DIAGNOSIS — M8080XD Other osteoporosis with current pathological fracture, unspecified site, subsequent encounter for fracture with routine healing: Secondary | ICD-10-CM

## 2014-03-01 DIAGNOSIS — Z299 Encounter for prophylactic measures, unspecified: Secondary | ICD-10-CM

## 2014-03-01 DIAGNOSIS — E785 Hyperlipidemia, unspecified: Secondary | ICD-10-CM

## 2014-03-01 MED ORDER — DICLOFENAC SODIUM 1 % TD GEL: 2.0000 g | Freq: Four times a day (QID) | TRANSDERMAL | Status: DC

## 2014-03-01 NOTE — Assessment & Plan Note (Signed)
-   Wife does suction patient at home occasionally but pt with increased congestion today - respirator therapist called and suctioning of secretions done with improvement - Will d/w Shana- SW in AM about possibly getting HHN to visit patient and possibly help suction him at home

## 2014-03-01 NOTE — Assessment & Plan Note (Signed)
-   Pt still with persistent back pain - c/w current medications but d/c capsacin and start voltaren gel - Pt with no allergic reaction to NSAIDs per wife but did have peptic ulcers in the past and hence they avoid NSAIDs

## 2014-03-01 NOTE — Assessment & Plan Note (Signed)
-   flu shot and prevnar 13 given in clinic today

## 2014-03-01 NOTE — Progress Notes (Signed)
Called to bedside by Outpatient RN for suctioning pt.  With sterile technique,  NTS pt x 3 for moderate white thick secretions.  Pt tol well, no distress noted. Sat 96% on room air prior to suctioning, post suctioning sat 99% on room air.  BBSH clear, no distress noted.

## 2014-03-01 NOTE — Patient Instructions (Signed)
-   It was a pleasure meeting you today - The insurance did not approve the lidocaine patch. I will start you on diclofenac (voltaren) gel for back pain - We will give you a flu shot today if you are agreeable to that - I will see you again in 3-6 months
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
## 2014-03-01 NOTE — Assessment & Plan Note (Signed)
-   will check lipid panel today and follow up results

## 2014-03-02 LAB — LIPID PANEL
Cholesterol: 156 mg/dL (ref 0–200)
HDL: 35 mg/dL — ABNORMAL LOW (ref >39)
LDL Cholesterol: 106 mg/dL — ABNORMAL HIGH (ref 0–99)
Total CHOL/HDL Ratio: 4.5 Ratio
Triglycerides: 76 mg/dL (ref <150)
VLDL: 15 mg/dL (ref 0–40)

## 2014-04-05 ENCOUNTER — Emergency Department (HOSPITAL_COMMUNITY): Payer: Medicare Other

## 2014-04-05 ENCOUNTER — Encounter (HOSPITAL_COMMUNITY): Payer: Self-pay | Admitting: *Deleted

## 2014-04-05 ENCOUNTER — Emergency Department (HOSPITAL_COMMUNITY)
Admission: EM | Admit: 2014-04-05 | Discharge: 2014-04-05 | Disposition: A | Discharge status: Home / Self Care | Payer: Medicare Other | Attending: Emergency Medicine | Admitting: Emergency Medicine

## 2014-04-05 DIAGNOSIS — K219 Gastro-esophageal reflux disease without esophagitis: Secondary | ICD-10-CM | POA: Diagnosis not present

## 2014-04-05 DIAGNOSIS — Z791 Long term (current) use of non-steroidal anti-inflammatories (NSAID): Secondary | ICD-10-CM | POA: Insufficient documentation

## 2014-04-05 DIAGNOSIS — Z8781 Personal history of (healed) traumatic fracture: Secondary | ICD-10-CM | POA: Insufficient documentation

## 2014-04-05 DIAGNOSIS — Z85828 Personal history of other malignant neoplasm of skin: Secondary | ICD-10-CM | POA: Diagnosis not present

## 2014-04-05 DIAGNOSIS — Z79899 Other long term (current) drug therapy: Secondary | ICD-10-CM | POA: Diagnosis not present

## 2014-04-05 DIAGNOSIS — R05 Cough: Secondary | ICD-10-CM | POA: Diagnosis not present

## 2014-04-05 DIAGNOSIS — R059 Cough, unspecified: Secondary | ICD-10-CM

## 2014-04-05 DIAGNOSIS — Z8619 Personal history of other infectious and parasitic diseases: Secondary | ICD-10-CM | POA: Insufficient documentation

## 2014-04-05 DIAGNOSIS — Z7902 Long term (current) use of antithrombotics/antiplatelets: Secondary | ICD-10-CM | POA: Insufficient documentation

## 2014-04-05 DIAGNOSIS — Z8673 Personal history of transient ischemic attack (TIA), and cerebral infarction without residual deficits: Secondary | ICD-10-CM | POA: Diagnosis not present

## 2014-04-05 DIAGNOSIS — Z8709 Personal history of other diseases of the respiratory system: Secondary | ICD-10-CM | POA: Insufficient documentation

## 2014-04-05 DIAGNOSIS — Z8701 Personal history of pneumonia (recurrent): Secondary | ICD-10-CM | POA: Insufficient documentation

## 2014-04-05 DIAGNOSIS — Z8711 Personal history of peptic ulcer disease: Secondary | ICD-10-CM | POA: Insufficient documentation

## 2014-04-05 DIAGNOSIS — Z8739 Personal history of other diseases of the musculoskeletal system and connective tissue: Secondary | ICD-10-CM | POA: Diagnosis not present

## 2014-04-05 LAB — CBC WITH DIFFERENTIAL/PLATELET
Basophils Absolute: 0 10*3/uL (ref 0.0–0.1)
Basophils Relative: 0 % (ref 0–1)
EOS ABS: 0.1 10*3/uL (ref 0.0–0.7)
Eosinophils Relative: 2 % (ref 0–5)
HCT: 43 % (ref 39.0–52.0)
HEMOGLOBIN: 13.9 g/dL (ref 13.0–17.0)
LYMPHS ABS: 1.5 10*3/uL (ref 0.7–4.0)
Lymphocytes Relative: 22 % (ref 12–46)
MCH: 31.4 pg (ref 26.0–34.0)
MCHC: 32.3 g/dL (ref 30.0–36.0)
MCV: 97.1 fL (ref 78.0–100.0)
MONOS PCT: 9 % (ref 3–12)
Monocytes Absolute: 0.6 10*3/uL (ref 0.1–1.0)
Neutro Abs: 4.6 10*3/uL (ref 1.7–7.7)
Neutrophils Relative %: 67 % (ref 43–77)
Platelets: 239 10*3/uL (ref 150–400)
RBC: 4.43 MIL/uL (ref 4.22–5.81)
RDW: 13.1 % (ref 11.5–15.5)
WBC: 6.9 10*3/uL (ref 4.0–10.5)

## 2014-04-05 LAB — I-STAT CHEM 8, ED
BUN: 19 mg/dL (ref 6–23)
Calcium, Ion: 1.14 mmol/L (ref 1.13–1.30)
Chloride: 108 mEq/L (ref 96–112)
Creatinine, Ser: 1 mg/dL (ref 0.50–1.35)
Glucose, Bld: 103 mg/dL — ABNORMAL HIGH (ref 70–99)
HCT: 46 % (ref 39.0–52.0)
Hemoglobin: 15.6 g/dL (ref 13.0–17.0)
POTASSIUM: 3.7 meq/L (ref 3.7–5.3)
SODIUM: 141 meq/L (ref 137–147)
TCO2: 20 mmol/L (ref 0–100)

## 2014-04-05 MED ORDER — ALBUTEROL SULFATE (2.5 MG/3ML) 0.083% IN NEBU
5.0000 mg | INHALATION_SOLUTION | Freq: Once | RESPIRATORY_TRACT | Status: AC
  Administered 2014-04-05: 5 mg via RESPIRATORY_TRACT
  Filled 2014-04-05: qty 6

## 2014-04-05 MED ORDER — IPRATROPIUM BROMIDE 0.02 % IN SOLN
0.5000 mg | Freq: Once | RESPIRATORY_TRACT | Status: AC
  Administered 2014-04-05: 0.5 mg via RESPIRATORY_TRACT
  Filled 2014-04-05: qty 2.5
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
## 2014-04-05 NOTE — ED Notes (Signed)
Bed: FM40 Expected date:  Expected time:  Means of arrival:  Comments: TBI

## 2014-04-05 NOTE — ED Notes (Signed)
Unable to complete ADL portion of triage due to TBI and patient's caregiver here in ED for overdose

## 2014-04-05 NOTE — Progress Notes (Signed)
CSW left message with [facility] informing them that she would like to file a report.  Willette Brace [phone] ED CSW 04/05/2014 4:21 PM

## 2014-04-05 NOTE — ED Notes (Signed)
[facility] present to take patient to [facility]

## 2014-04-05 NOTE — ED Notes (Signed)
Patient transported to X-ray 

## 2014-04-05 NOTE — Progress Notes (Signed)
CSW received consult for patient for placement. Patient caregiver unable to care for patient at this time due to mental health concerns. CSW attempting to locate resources for patient.   Noreene Larsson [phone]  ED CSW 04/05/2014 1424pm

## 2014-04-05 NOTE — Discharge Instructions (Signed)
Cough, Adult   A cough is a reflex. It helps you clear your throat and airways. A cough can help heal your body. A cough can last 2 or 3 weeks (acute) or may last more than 8 weeks (chronic). Some common causes of a cough can include an infection, allergy, or a cold.  HOME CARE  · Only take medicine as told by your doctor.  · If given, take your medicines (antibiotics) as told. Finish them even if you start to feel better.  · Use a cold steam vaporizer or humidifier in your home. This can help loosen thick spit (secretions).  · Sleep so you are almost sitting up (semi-upright). Use pillows to do this. This helps reduce coughing.  · Rest as needed.  · Stop smoking if you smoke.  GET HELP RIGHT AWAY IF:  · You have yellowish-white fluid (pus) in your thick spit.  · Your cough gets worse.  · Your medicine does not reduce coughing, and you are losing sleep.  · You cough up blood.  · You have trouble breathing.  · Your pain gets worse and medicine does not help.  · You have a fever.  MAKE SURE YOU:   · Understand these instructions.  · Will watch your condition.  · Will get help right away if you are not doing well or get worse.  Document Released: 12/25/2010 Document Revised: 08/28/2013 Document Reviewed: 12/25/2010  ExitCare® Patient Information ©2015 [employer]. This information is not intended to replace advice given to you by your health care provider. Make sure you discuss any questions you have with your health care provider.

## 2014-04-05 NOTE — ED Notes (Addendum)
Upon arrival to ED, patient noted to have congested non-productive cough Patient with hx of PNA and chronic aspiration which requires suction by his caregiver--unknown last time care was given RR WNL--even and unlabored with equal rise and fall of chest Rhonchi noted bilaterally Albuterol inhaler among the medications that came with patient--unable to determine time of last dose
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
## 2014-04-05 NOTE — ED Notes (Signed)
CSW present in ED

## 2014-04-05 NOTE — ED Notes (Addendum)
Per CSW and EDP notes, patient is to go to [facility] [place] Will call facility to give report

## 2014-04-05 NOTE — ED Notes (Signed)
Report given to Katharine Look, RN at [facility] Nurse at facility is family with patient, states that he has been been there twice before All questions answered by this nurse [facility] called and made aware of need to transport patient

## 2014-04-05 NOTE — Progress Notes (Signed)
Patient has been accepted into [facility]. CSW completed and faxed patient clinicals along with FL2. Patient does not have insurance. CSW will fill out request for LOG.  Willette Brace [phone] ED CSW 04/05/2014 4:36 PM

## 2014-04-06 ENCOUNTER — Other Ambulatory Visit: Payer: Self-pay

## 2014-04-06 DIAGNOSIS — M549 Dorsalgia, unspecified: Principal | ICD-10-CM

## 2014-04-06 DIAGNOSIS — G8929 Other chronic pain: Secondary | ICD-10-CM

## 2014-04-06 MED ORDER — MORPHINE SULFATE ER 15 MG PO TBCR: 15.0000 mg | EXTENDED_RELEASE_TABLET | Freq: Two times a day (BID) | ORAL | Status: DC

## 2014-04-06 NOTE — Telephone Encounter (Signed)
[facility]
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
## 2014-04-11 ENCOUNTER — Other Ambulatory Visit: Payer: Self-pay | Admitting: *Deleted

## 2014-04-11 DIAGNOSIS — G8929 Other chronic pain: Secondary | ICD-10-CM

## 2014-04-11 DIAGNOSIS — M549 Dorsalgia, unspecified: Principal | ICD-10-CM

## 2014-04-11 MED ORDER — MORPHINE SULFATE ER 15 MG PO TBCR: EXTENDED_RELEASE_TABLET | ORAL | Status: DC

## 2014-04-11 NOTE — Telephone Encounter (Signed)
[facility] 

## 2014-04-12 ENCOUNTER — Other Ambulatory Visit: Payer: Self-pay | Admitting: *Deleted

## 2014-04-12 DIAGNOSIS — M549 Dorsalgia, unspecified: Principal | ICD-10-CM

## 2014-04-12 DIAGNOSIS — G8929 Other chronic pain: Secondary | ICD-10-CM

## 2014-04-12 MED ORDER — MORPHINE SULFATE ER 15 MG PO TBCR: EXTENDED_RELEASE_TABLET | ORAL | Status: DC

## 2014-04-12 NOTE — Telephone Encounter (Signed)
[facility] 

## 2014-04-17 ENCOUNTER — Telehealth: Payer: Self-pay | Admitting: *Deleted

## 2014-04-17 DIAGNOSIS — M549 Dorsalgia, unspecified: Principal | ICD-10-CM

## 2014-04-17 DIAGNOSIS — G8929 Other chronic pain: Secondary | ICD-10-CM

## 2014-04-17 MED ORDER — MORPHINE SULFATE ER 15 MG PO TBCR: EXTENDED_RELEASE_TABLET | ORAL | Status: DC

## 2014-04-17 NOTE — Telephone Encounter (Signed)
Not sure about last refill.  EPIC shows refill on 12/17 from [facility]  I called [facility] and last Morphine refill was June [facility] had last refill 9/14 but they run a month behind   Phone # [phone]

## 2014-04-18 NOTE — Telephone Encounter (Signed)
OK, Patient was staying at [facility] and the doctors from [facility] write there Rx's.  The Rx's are filled by [facility]  And are dispensed daily to the facility.  The Rx's from 12/11, 12/16, 12/16 & 12/17 were not written rx's given to patient they were for in house use.  I called the patient and confirmed our Rx from 12/22 is ready.

## 2014-04-18 NOTE — Telephone Encounter (Signed)
Thank you Gayle 

## 2014-05-04 ENCOUNTER — Other Ambulatory Visit: Payer: Self-pay | Admitting: Internal Medicine

## 2014-05-04 NOTE — Telephone Encounter (Signed)
Has Feb appt 

## 2014-05-08 ENCOUNTER — Emergency Department (HOSPITAL_COMMUNITY): Payer: PPO

## 2014-05-08 ENCOUNTER — Encounter (HOSPITAL_COMMUNITY): Payer: Self-pay | Admitting: *Deleted

## 2014-05-08 ENCOUNTER — Observation Stay (HOSPITAL_COMMUNITY)
Admission: EM | Admit: 2014-05-08 | Discharge: 2014-05-12 | Disposition: A | Discharge status: Skilled Nursing Facility | Payer: PPO | Attending: Oncology | Admitting: Oncology

## 2014-05-08 DIAGNOSIS — Z79899 Other long term (current) drug therapy: Secondary | ICD-10-CM | POA: Diagnosis not present

## 2014-05-08 DIAGNOSIS — R159 Full incontinence of feces: Secondary | ICD-10-CM | POA: Diagnosis not present

## 2014-05-08 DIAGNOSIS — I10 Essential (primary) hypertension: Secondary | ICD-10-CM | POA: Insufficient documentation

## 2014-05-08 DIAGNOSIS — B1920 Unspecified viral hepatitis C without hepatic coma: Secondary | ICD-10-CM | POA: Insufficient documentation

## 2014-05-08 DIAGNOSIS — F329 Major depressive disorder, single episode, unspecified: Secondary | ICD-10-CM | POA: Insufficient documentation

## 2014-05-08 DIAGNOSIS — Z7902 Long term (current) use of antithrombotics/antiplatelets: Secondary | ICD-10-CM | POA: Insufficient documentation

## 2014-05-08 DIAGNOSIS — J449 Chronic obstructive pulmonary disease, unspecified: Secondary | ICD-10-CM | POA: Diagnosis not present

## 2014-05-08 DIAGNOSIS — S069X0S Unspecified intracranial injury without loss of consciousness, sequela: Secondary | ICD-10-CM

## 2014-05-08 DIAGNOSIS — K279 Peptic ulcer, site unspecified, unspecified as acute or chronic, without hemorrhage or perforation: Secondary | ICD-10-CM | POA: Diagnosis not present

## 2014-05-08 DIAGNOSIS — R32 Unspecified urinary incontinence: Secondary | ICD-10-CM | POA: Insufficient documentation

## 2014-05-08 DIAGNOSIS — R4182 Altered mental status, unspecified: Secondary | ICD-10-CM | POA: Diagnosis present

## 2014-05-08 DIAGNOSIS — M4856XA Collapsed vertebra, not elsewhere classified, lumbar region, initial encounter for fracture: Secondary | ICD-10-CM | POA: Insufficient documentation

## 2014-05-08 DIAGNOSIS — W19XXXA Unspecified fall, initial encounter: Secondary | ICD-10-CM

## 2014-05-08 DIAGNOSIS — M4854XA Collapsed vertebra, not elsewhere classified, thoracic region, initial encounter for fracture: Secondary | ICD-10-CM | POA: Diagnosis not present

## 2014-05-08 DIAGNOSIS — Z886 Allergy status to analgesic agent status: Secondary | ICD-10-CM | POA: Insufficient documentation

## 2014-05-08 DIAGNOSIS — Z87820 Personal history of traumatic brain injury: Secondary | ICD-10-CM | POA: Diagnosis not present

## 2014-05-08 DIAGNOSIS — K219 Gastro-esophageal reflux disease without esophagitis: Secondary | ICD-10-CM | POA: Insufficient documentation

## 2014-05-08 DIAGNOSIS — Z885 Allergy status to narcotic agent status: Secondary | ICD-10-CM | POA: Diagnosis not present

## 2014-05-08 DIAGNOSIS — M479 Spondylosis, unspecified: Secondary | ICD-10-CM | POA: Diagnosis not present

## 2014-05-08 DIAGNOSIS — Z87440 Personal history of urinary (tract) infections: Secondary | ICD-10-CM | POA: Insufficient documentation

## 2014-05-08 DIAGNOSIS — G8929 Other chronic pain: Secondary | ICD-10-CM

## 2014-05-08 DIAGNOSIS — M549 Dorsalgia, unspecified: Secondary | ICD-10-CM

## 2014-05-08 DIAGNOSIS — Z8673 Personal history of transient ischemic attack (TIA), and cerebral infarction without residual deficits: Secondary | ICD-10-CM | POA: Insufficient documentation

## 2014-05-08 LAB — CBC WITH DIFFERENTIAL/PLATELET
BASOS ABS: 0 10*3/uL (ref 0.0–0.1)
BASOS PCT: 0 % (ref 0–1)
Eosinophils Absolute: 0.2 10*3/uL (ref 0.0–0.7)
Eosinophils Relative: 3 % (ref 0–5)
HCT: 44.5 % (ref 39.0–52.0)
Hemoglobin: 14.8 g/dL (ref 13.0–17.0)
LYMPHS ABS: 1.6 10*3/uL (ref 0.7–4.0)
Lymphocytes Relative: 22 % (ref 12–46)
MCH: 32.8 pg (ref 26.0–34.0)
MCHC: 33.3 g/dL (ref 30.0–36.0)
MCV: 98.7 fL (ref 78.0–100.0)
MONO ABS: 0.6 10*3/uL (ref 0.1–1.0)
Monocytes Relative: 8 % (ref 3–12)
NEUTROS ABS: 5 10*3/uL (ref 1.7–7.7)
NEUTROS PCT: 67 % (ref 43–77)
Platelets: 230 10*3/uL (ref 150–400)
RBC: 4.51 MIL/uL (ref 4.22–5.81)
RDW: 13.6 % (ref 11.5–15.5)
WBC: 7.4 10*3/uL (ref 4.0–10.5)

## 2014-05-08 LAB — URINALYSIS, ROUTINE W REFLEX MICROSCOPIC
BILIRUBIN URINE: NEGATIVE
Glucose, UA: NEGATIVE mg/dL
Hgb urine dipstick: NEGATIVE
KETONES UR: NEGATIVE mg/dL
LEUKOCYTES UA: NEGATIVE
Nitrite: NEGATIVE
PROTEIN: NEGATIVE mg/dL
SPECIFIC GRAVITY, URINE: 1.019 (ref 1.005–1.030)
Urobilinogen, UA: 1 mg/dL (ref 0.0–1.0)
pH: 8 (ref 5.0–8.0)

## 2014-05-08 LAB — BASIC METABOLIC PANEL
Anion gap: 8 (ref 5–15)
BUN: 20 mg/dL (ref 6–23)
CHLORIDE: 110 meq/L (ref 96–112)
CO2: 22 mmol/L (ref 19–32)
Calcium: 9.3 mg/dL (ref 8.4–10.5)
Creatinine, Ser: 1.1 mg/dL (ref 0.50–1.35)
GFR, EST AFRICAN AMERICAN: 79 mL/min — AB (ref >90)
GFR, EST NON AFRICAN AMERICAN: 68 mL/min — AB (ref >90)
GLUCOSE: 89 mg/dL (ref 70–99)
POTASSIUM: 4.5 mmol/L (ref 3.5–5.1)
Sodium: 140 mmol/L (ref 135–145)

## 2014-05-08 MED ORDER — SODIUM CHLORIDE 0.9 % IV SOLN: 250.0000 mL | INTRAVENOUS | Status: DC | PRN

## 2014-05-08 MED ORDER — ONDANSETRON HCL 4 MG PO TABS: 4.0000 mg | ORAL_TABLET | Freq: Four times a day (QID) | ORAL | Status: DC | PRN

## 2014-05-08 MED ORDER — BUDESONIDE-FORMOTEROL FUMARATE 160-4.5 MCG/ACT IN AERO
2.0000 | INHALATION_SPRAY | Freq: Two times a day (BID) | RESPIRATORY_TRACT | Status: DC
  Administered 2014-05-08 – 2014-05-12 (×8): 2 via RESPIRATORY_TRACT
  Filled 2014-05-08: qty 6

## 2014-05-08 MED ORDER — TRAMADOL HCL 50 MG PO TABS
50.0000 mg | ORAL_TABLET | Freq: Four times a day (QID) | ORAL | Status: DC | PRN
Start: 2014-05-08 — End: 2014-05-09
  Administered 2014-05-08: 50 mg via ORAL
  Filled 2014-05-08 (×2): qty 1

## 2014-05-08 MED ORDER — SODIUM CHLORIDE 0.9 % IJ SOLN: 3.0000 mL | INTRAMUSCULAR | Status: DC | PRN

## 2014-05-08 MED ORDER — ALBUTEROL SULFATE (2.5 MG/3ML) 0.083% IN NEBU
2.5000 mg | INHALATION_SOLUTION | Freq: Four times a day (QID) | RESPIRATORY_TRACT | Status: DC | PRN
Start: 2014-05-08 — End: 2014-05-12

## 2014-05-08 MED ORDER — ALBUTEROL SULFATE (2.5 MG/3ML) 0.083% IN NEBU: 2.5000 mg | INHALATION_SOLUTION | Freq: Four times a day (QID) | RESPIRATORY_TRACT | Status: DC | PRN

## 2014-05-08 MED ORDER — MIRABEGRON ER 50 MG PO TB24
50.0000 mg | ORAL_TABLET | Freq: Every day | ORAL | Status: DC
  Administered 2014-05-08 – 2014-05-12 (×5): 50 mg via ORAL
  Filled 2014-05-08 (×6): qty 1

## 2014-05-08 MED ORDER — PANTOPRAZOLE SODIUM 40 MG PO TBEC
40.0000 mg | DELAYED_RELEASE_TABLET | Freq: Every day | ORAL | Status: DC
  Administered 2014-05-08 – 2014-05-12 (×5): 40 mg via ORAL
  Filled 2014-05-08 (×4): qty 1

## 2014-05-08 MED ORDER — ONDANSETRON HCL 4 MG/2ML IJ SOLN: 4.0000 mg | Freq: Four times a day (QID) | INTRAMUSCULAR | Status: DC | PRN

## 2014-05-08 MED ORDER — STARCH (THICKENING) PO POWD
ORAL | Status: DC | PRN
  Filled 2014-05-08: qty 227

## 2014-05-08 MED ORDER — CLOPIDOGREL BISULFATE 75 MG PO TABS
75.0000 mg | ORAL_TABLET | Freq: Every morning | ORAL | Status: DC
  Administered 2014-05-09 – 2014-05-12 (×4): 75 mg via ORAL
  Filled 2014-05-08 (×5): qty 1

## 2014-05-08 MED ORDER — SODIUM CHLORIDE 0.9 % IJ SOLN
3.0000 mL | Freq: Two times a day (BID) | INTRAMUSCULAR | Status: DC
  Administered 2014-05-09 – 2014-05-12 (×6): 3 mL via INTRAVENOUS

## 2014-05-08 MED ORDER — METOPROLOL TARTRATE 12.5 MG HALF TABLET
12.5000 mg | ORAL_TABLET | Freq: Every day | ORAL | Status: DC
  Administered 2014-05-08 – 2014-05-11 (×4): 12.5 mg via ORAL
  Filled 2014-05-08 (×5): qty 1

## 2014-05-08 MED ORDER — ALBUTEROL SULFATE HFA 108 (90 BASE) MCG/ACT IN AERS: 2.0000 | INHALATION_SPRAY | Freq: Four times a day (QID) | RESPIRATORY_TRACT | Status: DC | PRN

## 2014-05-08 MED ORDER — DULOXETINE HCL 60 MG PO CPEP
60.0000 mg | ORAL_CAPSULE | Freq: Every day | ORAL | Status: DC
  Administered 2014-05-08 – 2014-05-12 (×5): 60 mg via ORAL
  Filled 2014-05-08 (×6): qty 1

## 2014-05-08 MED ORDER — ENOXAPARIN SODIUM 40 MG/0.4ML ~~LOC~~ SOLN
40.0000 mg | SUBCUTANEOUS | Status: DC
  Administered 2014-05-08 – 2014-05-11 (×4): 40 mg via SUBCUTANEOUS
  Filled 2014-05-08 (×5): qty 0.4

## 2014-05-08 MED ORDER — HYDROCORTISONE 1 % EX CREA
TOPICAL_CREAM | Freq: Two times a day (BID) | CUTANEOUS | Status: DC
  Administered 2014-05-08: 1 via TOPICAL
  Administered 2014-05-09 – 2014-05-12 (×7): via TOPICAL
  Filled 2014-05-08: qty 28

## 2014-05-08 MED ORDER — HYDROCHLOROTHIAZIDE 25 MG PO TABS
25.0000 mg | ORAL_TABLET | Freq: Every day | ORAL | Status: DC
  Administered 2014-05-08 – 2014-05-12 (×5): 25 mg via ORAL
  Filled 2014-05-08 (×6): qty 1

## 2014-05-08 NOTE — ED Notes (Signed)
Pt currently in CT.

## 2014-05-08 NOTE — ED Notes (Signed)
Pt unable to give urine sample do to incontinence. Pts wife will not allow staff to do an in and out catheter. Condom catheter applied for urine sample collection.

## 2014-05-08 NOTE — Progress Notes (Signed)
Pt arrived to unit via stretcher alert and oriented x2. Oriented to room, unit, and staff.  Bed in lowest position and call bell is within reach. Will continue to monitor.
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
## 2014-05-08 NOTE — ED Notes (Signed)
Per [facility] pt fell at home lastnight and said he hit his head going to the bathroom.  Stated that he woke up in the middle of the night very confused.  He also had a fall 2 days ago.  Was brought in to be evaluated. Pt is on anticoagulants. Vitals are as follows: B/P:130/88 HR:78, CBG:99, R:20, O2Sat 95% on RA.
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
## 2014-05-09 MED ORDER — MORPHINE SULFATE ER 15 MG PO TBCR
15.0000 mg | EXTENDED_RELEASE_TABLET | Freq: Two times a day (BID) | ORAL | Status: DC
  Administered 2014-05-09 – 2014-05-10 (×3): 15 mg via ORAL
  Filled 2014-05-09 (×3): qty 1

## 2014-05-09 MED ORDER — MORPHINE SULFATE ER 15 MG PO TBCR: 15.0000 mg | EXTENDED_RELEASE_TABLET | Freq: Two times a day (BID) | ORAL | Status: DC
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
## 2014-05-09 NOTE — Progress Notes (Signed)
UR completed 

## 2014-05-09 NOTE — Progress Notes (Signed)
Rehab Admissions Coordinator Note:  Patient was screened by Cleatrice Burke for appropriateness for an Inpatient Acute Rehab Consult per PT recommendation.   At this time, we are recommending Inpatient Rehab consult. Please place order for inpt rehab consult for assessment for disposition.  Cleatrice Burke 05/09/2014, 5:15 PM  I can be reached at [phone].

## 2014-05-09 NOTE — Clinical Social Work Psychosocial (Signed)
Clinical Social Work Department BRIEF PSYCHOSOCIAL ASSESSMENT 05/09/2014  Patient: Todd [name], Todd [name] Account Number: [id number] Admit date: 05/08/2014  Clinical Social Worker: Lovey Newcomer Date/Time: 05/09/2014 03:20 PM  Referred by: Physician Date Referred: 05/09/2014 Referred for  SNF Placement   Other Referral:  NA   Interview type: Family Other interview type:  [name] wife interviewed at bedside.    PSYCHOSOCIAL DATA Living Status: WIFE Admitted from facility:  Level of care:  Primary support name: Todd [name] Primary support relationship to patient: SPOUSE Degree of support available:  Support is good.    CURRENT CONCERNS Current Concerns  Post-Acute Placement   Other Concerns:  NA    SOCIAL WORK ASSESSMENT / PLAN CSW met with patient's wife at bedside to complete assessment. [name] wife Todd [name] states that the patient was admitted from home where he receives respite care through the [place]. Todd [name] states that these services will end on 06/03/14 and requests information on home health services available to patient (RNCM made aware). Todd [name] states that she is interested in knowing the SNF benefits for her husband, but does not intend on having the patient placed at discharge. CSW will inform wife of SNF benefits once insurance company calls back. Todd [name] states that she is able to take care of the patient with the assistance he is getting from the [place].   Assessment/plan status: Psychosocial Support/Ongoing Assessment of Needs Other assessment/ plan:  NONE   Information/referral to community resources:  CSW will provide patient's wife with information on SNF benefits.    [name]/FAMILY'S RESPONSE TO PLAN OF CARE: [name] wife plans for patient to DC home when medically stable. CSW has made RNCM aware. CSW will sign off.       [name] MSW, [place], [place], [phone]

## 2014-05-10 DIAGNOSIS — R296 Repeated falls: Secondary | ICD-10-CM

## 2014-05-10 DIAGNOSIS — H538 Other visual disturbances: Secondary | ICD-10-CM

## 2014-05-10 DIAGNOSIS — I69322 Dysarthria following cerebral infarction: Secondary | ICD-10-CM

## 2014-05-10 DIAGNOSIS — G8929 Other chronic pain: Secondary | ICD-10-CM

## 2014-05-10 DIAGNOSIS — Z7951 Long term (current) use of inhaled steroids: Secondary | ICD-10-CM

## 2014-05-10 DIAGNOSIS — J449 Chronic obstructive pulmonary disease, unspecified: Secondary | ICD-10-CM

## 2014-05-10 DIAGNOSIS — R471 Dysarthria and anarthria: Secondary | ICD-10-CM

## 2014-05-10 DIAGNOSIS — Z87820 Personal history of traumatic brain injury: Secondary | ICD-10-CM

## 2014-05-10 DIAGNOSIS — Z7902 Long term (current) use of antithrombotics/antiplatelets: Secondary | ICD-10-CM

## 2014-05-10 DIAGNOSIS — K219 Gastro-esophageal reflux disease without esophagitis: Secondary | ICD-10-CM

## 2014-05-10 DIAGNOSIS — R4701 Aphasia: Secondary | ICD-10-CM

## 2014-05-10 DIAGNOSIS — Z9181 History of falling: Secondary | ICD-10-CM

## 2014-05-10 DIAGNOSIS — S098XXA Other specified injuries of head, initial encounter: Secondary | ICD-10-CM

## 2014-05-10 DIAGNOSIS — G9340 Encephalopathy, unspecified: Secondary | ICD-10-CM

## 2014-05-10 DIAGNOSIS — N3281 Overactive bladder: Secondary | ICD-10-CM

## 2014-05-10 DIAGNOSIS — M545 Low back pain: Secondary | ICD-10-CM

## 2014-05-10 DIAGNOSIS — F329 Major depressive disorder, single episode, unspecified: Secondary | ICD-10-CM

## 2014-05-10 DIAGNOSIS — I10 Essential (primary) hypertension: Secondary | ICD-10-CM

## 2014-05-10 DIAGNOSIS — I69392 Facial weakness following cerebral infarction: Secondary | ICD-10-CM

## 2014-05-10 DIAGNOSIS — R270 Ataxia, unspecified: Secondary | ICD-10-CM

## 2014-05-10 MED ORDER — MORPHINE SULFATE ER 15 MG PO TBCR
15.0000 mg | EXTENDED_RELEASE_TABLET | Freq: Every day | ORAL | Status: DC
  Administered 2014-05-11 – 2014-05-12 (×2): 15 mg via ORAL
  Filled 2014-05-10 (×2): qty 1
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
## 2014-05-10 NOTE — Progress Notes (Signed)
UR completed 
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
## 2014-05-11 LAB — TROPONIN I: Troponin I: 0.03 ng/mL (ref <0.031)

## 2014-05-11 MED ORDER — GI COCKTAIL ~~LOC~~
30.0000 mL | Freq: Three times a day (TID) | ORAL | Status: DC | PRN
  Filled 2014-05-11: qty 30

## 2014-05-11 MED ORDER — AMOXICILLIN-POT CLAVULANATE 875-125 MG PO TABS: 1.0000 | ORAL_TABLET | Freq: Two times a day (BID) | ORAL | Status: DC

## 2014-05-11 MED ORDER — MORPHINE SULFATE ER 15 MG PO TBCR: 15.0000 mg | EXTENDED_RELEASE_TABLET | Freq: Every day | ORAL | Status: DC

## 2014-05-11 MED ORDER — METOPROLOL TARTRATE 12.5 MG HALF TABLET
12.5000 mg | ORAL_TABLET | Freq: Two times a day (BID) | ORAL | Status: DC
  Administered 2014-05-11 – 2014-05-12 (×2): 12.5 mg via ORAL
  Filled 2014-05-11 (×4): qty 1

## 2014-05-11 MED ORDER — ASPIRIN 325 MG PO TABS
325.0000 mg | ORAL_TABLET | Freq: Every day | ORAL | Status: DC
  Administered 2014-05-11 – 2014-05-12 (×2): 325 mg via ORAL
  Filled 2014-05-11 (×2): qty 1

## 2014-05-11 MED ORDER — NITROGLYCERIN 0.4 MG SL SUBL: 0.4000 mg | SUBLINGUAL_TABLET | SUBLINGUAL | Status: DC | PRN
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
## 2014-05-11 NOTE — Care Management Note (Signed)
    Page 1 of 1   05/11/2014     11:30:47 AM CARE MANAGEMENT NOTE 05/11/2014  Patient:  OBRIEN, HUSKINS   Account Number:  [id number]  Date Initiated:  05/11/2014  Documentation initiated by:  Tomi Bamberger  Subjective/Objective Assessment:   dx ams  admit- lives with spouse.     Action/Plan:   pt eval- rec CIR,  CIR not option, plan is ro snf.   Anticipated DC Date:  05/11/2014   Anticipated DC Plan:  SKILLED NURSING FACILITY  In-house referral  Clinical Social Worker      DC Planning Services  CM consult      Choice offered to / List presented to:             Status of service:  Completed, signed off Medicare Important Message given?  YES (If response is "NO", the following Medicare IM given date fields will be blank) Date Medicare IM given:  05/11/2014 Medicare IM given by:  Tomi Bamberger Date Additional Medicare IM given:   Additional Medicare IM given by:    Discharge Disposition:  [place]  Per UR Regulation:  Reviewed for med. necessity/level of care/duration of stay  If discussed at [place] of Stay Meetings, dates discussed:    Comments:  05/11/14 [place], BSN [phone]  patient is for dc to [facility] today, CSW following.

## 2014-05-11 NOTE — Progress Notes (Signed)
Twenty minutes prior to discharge, patient reporting central to left-sided chest pain associated with dyspnea. Patient has not had any chest pain during this admission for altered mental status. Patient denies ever having chest pain like this ever before. Upon interview and exam, patient denying any chest pain. Upon further review of the chart, there is an admission from 10/18/2009 to 10/22/2009 for syncope that was remarkable for elevated cardiac enzymes as well as transient Q waves. Cardiology evaluated and a Myoview was positive for exercise induced ischemia. Patient was medically managed per family wishes and started Plavix. Patient was also started on a beta blocker at that time. This all increases the risk for cardiac events no it is also possible that patient's pain could be GI, musculoskeletal, or psychological in etiology given its very transient nature. -EKG -Troponin 3 -Aspirin 325 mg -Sublingual nitroglycerin when necessary -Supplemental oxygen -Home metoprolol resumed.

## 2014-05-11 NOTE — Progress Notes (Signed)
Pt. Had c/o SOB and having trouble "catching his breath". Assessed pt. And vitals. Vital signs appear stable and O2 sats were fine. Encouraged pt. To take a strong cough and clear his throat. Pt. Appears stable. Will continue to monitor.

## 2014-05-11 NOTE — Clinical Social Work Note (Signed)
Clinical Social Worker attempted to facilitate patient discharge, however patient experiencing chest pain at time of discharge.  Per MD, patient to have cardiac work up prior to discharge.  CSW spoke with [facility] who states that patient is able to admit over the weekend if medically clear.  Facility to update patient wife, due to her current presence at the facility to complete paperwork.  CSW contacted patient insurance provider who states that patient has authorization for Saturday 05/12/2014 and if discharge is delayed until Sunday, new authorization will need to be obtained from on call authorization line.  CSW remains available for support and to facilitate patient discharge needs once medically stable.  Barbette Or, [place]

## 2014-05-11 NOTE — Clinical Social Work Placement (Addendum)
Clinical Social Work Department CLINICAL SOCIAL WORK PLACEMENT NOTE 05/11/2014  Patient:  Todd [name], Todd [name]  Account Number:  [id number] Admit date:  05/08/2014  Clinical Social Worker:  Lovey Newcomer  Date/time:  05/11/2014 07:36 AM  Clinical Social Work is seeking post-discharge placement for this patient at the following level of care:   [place]   (*CSW will update this form in Epic as items are completed)   05/10/2014  Patient/family provided with [place] Department of Clinical Social Work's list of facilities offering this level of care within the geographic area requested by the patient (or if unable, by the patient's family).  05/10/2014  Patient/family informed of their freedom to choose among providers that offer the needed level of care, that participate in Medicare, Medicaid or managed care program needed by the patient, have an available bed and are willing to accept the patient.  05/10/2014  Patient/family informed of [facility]' ownership interest in [facility], as well as of the fact that they are under no obligation to receive care at this facility.  PASARR submitted to EDS on  PASARR number received on   FL2 transmitted to all facilities in geographic area requested by pt/family on  05/10/2014 FL2 transmitted to all facilities within larger geographic area on   Patient informed that his/her managed care company has contracts with or will negotiate with  certain facilities, including the following:     Patient/family informed of bed offers received:  05/10/2014 Patient chooses bed at [facility] Physician recommends and patient chooses bed at    Patient to be transferred to [facility] on 05/12/2014   Patient to be transferred to facility by [facility] Patient and family notified of transfer on  Name of family member notified:   The following physician request were entered in Epic:   Additional Comments:   [name]  MSW, [place], [place], [phone]

## 2014-05-11 NOTE — Progress Notes (Signed)
Pt. States that he is still having some chest pain and is due to D/C'd to [place] at 1530. Called MD to make aware; awaiting call back.

## 2014-05-11 NOTE — Progress Notes (Signed)
Rehab admissions - I met with pt in follow up to rehab MD consult and shared that rehab MD is recommending pursuing skilled nursing or home health. Per rehab MD, "if an acute or reversible cause for his loss of function is identified then CIR, if not then SNF or home with home health. Recommend neurology consult. Much of his deficits appear to be chronic. If we are dealing with primarily chronic deficits do not think an inpatient rehabilitation stay is indicated."  Pt was eating breakfast with NA assistance and had no questions. I then called and spoke with pt's wife. I had an extensive conversation with her and pt's wife was not pleased with rehab MD recommendation. Wife states that yesterday's assessment was not done at an appropriate time because pt was lethargic due to morphine. Pt's wife strongly advocated for pt that he has had significant changes recently. Support was provided.  I also explained that at this time, pt has private insurance with [employer] and that pt is in observation status. Typically, private insurances do not give authorization for inpatient rehab when someone is in observation status. I also explained that a possible inpatient rehab admission is dependent on insurance approval and not pt/family preference.  I encouraged pt's wife to speak directly with attending/medical team about her husband's status and explained that Jesse/Bryant from social work would help to plan for skilled nursing. By the end of the extended conversation, wife was agreeable to SNF at [facility] and even stated, "I think that would be the better option because he can stay there longer."  I called and updated Denyse Amass, Education officer, museum. At this time, per rehab MD, we are recommending that SNF or home health be pursued. Please call me with questions.  Thanks.  Nanetta Batty, PT Rehabilitation Admissions Coordinator [phone]

## 2014-05-11 NOTE — Clinical Social Work Note (Signed)
Clinical Social Worker met with patient wife at bedside and explained that patient insurance is not in network with [employer].  Patient wife very understanding and agreeable with [facility].  CSW contacted [facility] who is in agreement with patient discharge plan for today.  CSW to update insurance provider with discharge plan.  CSW remains available for support and to facilitate patient discharge needs.  Barbette Or, [place]

## 2014-05-11 NOTE — Clinical Social Work Note (Signed)
Wife changed her mind yesterday and would like SNF backup to CIR. Patient has bed at [facility] (wife's preference) and can DC to facility if denied by CIR. Report left for covering CSW.    [name] MSW, [place], [place], [phone]
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
## 2014-05-12 LAB — TROPONIN I
TROPONIN I: 0.03 ng/mL (ref <0.031)
Troponin I: 0.03 ng/mL (ref <0.031)

## 2014-05-12 MED ORDER — AMOXICILLIN-POT CLAVULANATE 875-125 MG PO TABS
1.0000 | ORAL_TABLET | Freq: Two times a day (BID) | ORAL | Status: DC
  Administered 2014-05-12: 1 via ORAL
  Filled 2014-05-12 (×2): qty 1

## 2014-05-12 MED ORDER — AMOXICILLIN-POT CLAVULANATE 875-125 MG PO TABS: 1.0000 | ORAL_TABLET | Freq: Two times a day (BID) | ORAL | Status: AC
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
## 2014-05-12 NOTE — Progress Notes (Signed)
UR completed 

## 2014-05-12 NOTE — Discharge Instructions (Signed)
Please follow-up in our clinic as indicated. Please complete your course of antibiotics for a urinary tract infection (Augmentin 375-125 milligrams twice a day for 4 days with an end date of 05/16/2014).   Altered Mental Status Altered mental status most often refers to an abnormal change in your responsiveness and awareness. It can affect your speech, thought, mobility, memory, attention span, or alertness. It can range from slight confusion to complete unresponsiveness (coma). Altered mental status can be a sign of a serious underlying medical condition. Rapid evaluation and medical treatment is necessary for patients having an altered mental status. CAUSES   Low blood sugar (hypoglycemia) or diabetes.  Severe loss of body fluids (dehydration) or a body salt (electrolyte) imbalance.  A stroke or other neurologic problem, such as dementia or delirium.  A head injury or tumor.  A drug or alcohol overdose.  Exposure to toxins or poisons.  Depression, anxiety, and stress.  A low oxygen level (hypoxia).  An infection.  Blood loss.  Twitching or shaking (seizure).  Heart problems, such as heart attack or heart rhythm problems (arrhythmias).  A body temperature that is too low or too high (hypothermia or hyperthermia). DIAGNOSIS  A diagnosis is based on your history, symptoms, physical and neurologic examinations, and diagnostic tests. Diagnostic tests may include:  Measurement of your blood pressure, pulse, breathing, and oxygen levels (vital signs).  Blood tests.  Urine tests.  X-ray exams.  A computerized magnetic scan (magnetic resonance imaging, MRI).  A computerized X-ray scan (computed tomography, CT scan). TREATMENT  Treatment will depend on the cause. Treatment may include:  Management of an underlying medical or mental health condition.  Critical care or support in the hospital. [place]   Only take over-the-counter or prescription medicines  for pain, discomfort, or fever as directed by your caregiver.  Manage underlying conditions as directed by your caregiver.  Eat a healthy, well-balanced diet to maintain strength.  Join a support group or prevention program to cope with the condition or trauma that caused the altered mental status. Ask your caregiver to help choose a program that works for you.  Follow up with your caregiver for further examination, therapy, or testing as directed. SEEK MEDICAL CARE IF:   You feel unwell or have chills.  You or your family notice a change in your behavior or your alertness.  You have trouble following your caregiver's treatment plan.  You have questions or concerns. SEEK IMMEDIATE MEDICAL CARE IF:   You have a rapid heartbeat or have chest pain.  You have difficulty breathing.  You have a fever.  You have a headache with a stiff neck.  You cough up blood.  You have blood in your urine or stool.  You have severe agitation or confusion. MAKE SURE YOU:   Understand these instructions.  Will watch your condition.  Will get help right away if you are not doing well or get worse. Document Released: 10/01/2009 Document Revised: 07/06/2011 Document Reviewed: 10/01/2009 [facility] Patient Information 2015 [place], [place]. This information is not intended to replace advice given to you by your health care provider. Make sure you discuss any questions you have with your health care provider.

## 2014-05-12 NOTE — Progress Notes (Signed)
Pt prepared for d/c to SNF. IV d/c'd. Skin intact except as most recently charted. Vitals are stable. Report called to receiving facility. Pt  transported by ambulance service.

## 2014-05-14 LAB — URINE CULTURE: Colony Count: >=100000

## 2014-05-17 ENCOUNTER — Ambulatory Visit: Payer: PPO | Admitting: Internal Medicine

## 2014-05-31 ENCOUNTER — Encounter: Payer: Self-pay | Admitting: Internal Medicine

## 2014-05-31 ENCOUNTER — Ambulatory Visit (INDEPENDENT_AMBULATORY_CARE_PROVIDER_SITE_OTHER): Payer: PPO | Admitting: Internal Medicine

## 2014-05-31 DIAGNOSIS — M549 Dorsalgia, unspecified: Secondary | ICD-10-CM

## 2014-05-31 DIAGNOSIS — Z87820 Personal history of traumatic brain injury: Secondary | ICD-10-CM

## 2014-05-31 DIAGNOSIS — G8929 Other chronic pain: Secondary | ICD-10-CM

## 2014-05-31 MED ORDER — HYDROCHLOROTHIAZIDE 25 MG PO TABS: 25.0000 mg | ORAL_TABLET | Freq: Every day | ORAL | Status: DC

## 2014-05-31 MED ORDER — LIDOCAINE 5 % EX PTCH: 1.0000 | MEDICATED_PATCH | Freq: Two times a day (BID) | CUTANEOUS | Status: AC

## 2014-05-31 NOTE — Patient Instructions (Signed)
-   It was a pleasure seeing you today - Please continue with your current medications - I will see you in 3 months - Please call me with any questions

## 2014-05-31 NOTE — Assessment & Plan Note (Addendum)
-   Pt is now on lidocaine patch (able to obtain from [place]) and alleve PO 2 pills a day - Pain is well controlled on this regimen. He also has a prescription for oxycodone at home which he has not yet required.  - I have asked his wife to bring in the oxycodone bottle to get the exact dose before adding it to the medication list - I have referred patient to sports medicine at wife's request

## 2014-05-31 NOTE — Assessment & Plan Note (Signed)
-   pt with recurrent episodes of not being himself (more confused) with last one being last night - I will refer him to neurology for follow up and further w/u given multiple negative work ups for TIA - Wife is in agreement

## 2014-05-31 NOTE — Progress Notes (Signed)
   Subjective:    Patient ID: Todd [name], male    DOB: March 17, 1946, 69 y.o.   MRN: [id number]  HPI Pt here for routine follow up. He feels well today and his only complaint is mild blurry vision. Wife also states that last night he had an episode where he wasn't himself. He has had multiple negative work ups for TIAs in the past and she would like to follow with neurology as an outpatient  He was recently discharged from [place] and was in rehab at a SNF and was discharged from there approx 10 days ago.  Pt still has back pain but it is controlled on a lidocaine patch and alleve (two pills a day). He was also given a prescription for oxycodone prn but is not requiring this at this time  Wife states that he is in the process of switching insurance plans in order to get home PT and OT paid for and will let me know when this occurs so I can put in the order     Review of Systems  Constitutional: Negative.   HENT: Negative.   Eyes: Positive for visual disturbance.       Pt with mild blurry vision  Respiratory: Negative.   Cardiovascular: Negative.   Gastrointestinal: Negative.   Genitourinary: Negative.   Musculoskeletal: Positive for back pain.       Well controlled on current regimen  Skin: Negative.   Neurological:       Pt with chronic deficits secondary to TBI  Psychiatric/Behavioral: Negative.        Objective:   Physical Exam  Constitutional: He is oriented to person, place, and time. He appears well-developed and well-nourished.  HENT:  Head: Normocephalic and atraumatic.  Eyes: Conjunctivae are normal. Right eye exhibits no discharge. Left eye exhibits no discharge.  Neck: Normal range of motion.  Cardiovascular: Normal rate, regular rhythm and normal heart sounds.   Pulmonary/Chest: Effort normal and breath sounds normal.  Abdominal: Soft. Bowel sounds are normal. There is no tenderness. There is no rebound.  Musculoskeletal: Normal range of motion. He  exhibits no edema.  Neurological: He is alert and oriented to person, place, and time.  Skin: Skin is warm and dry.          Assessment & Plan:  Please see problem based charting for assessment and plan:

## 2014-06-04 ENCOUNTER — Telehealth: Payer: Self-pay | Admitting: *Deleted

## 2014-06-04 NOTE — Telephone Encounter (Signed)
That would be fine. Do you mind faxing them Edd Fabian? Is there anything I need to do before you fax them?

## 2014-06-04 NOTE — Telephone Encounter (Signed)
Pt's wife called stating she needs records sent to [facility] doctor in [place] so pt can get his meds through [place] system. Please send progress notes and Rx's pt is taking.  Fax to [phone]   Att:  Dr Rodney Langton   meds stopped are Morphine and Metoprolol. Starting HCTZ 25 mg daily and Oxycodone 5 mg tablets 1 tab BID PRN for chronic back pain # 60  I can fax for you. Pt needs by 2/12

## 2014-06-05 NOTE — Telephone Encounter (Signed)
Forms faxed

## 2014-06-12 ENCOUNTER — Other Ambulatory Visit: Payer: Self-pay | Admitting: *Deleted

## 2014-06-12 MED ORDER — CLOPIDOGREL BISULFATE 75 MG PO TABS: 75.0000 mg | ORAL_TABLET | Freq: Every morning | ORAL | Status: DC

## 2014-06-12 MED ORDER — MIRABEGRON ER 50 MG PO TB24: 50.0000 mg | ORAL_TABLET | Freq: Every day | ORAL | Status: DC

## 2014-06-12 NOTE — Telephone Encounter (Signed)
Pt wants these meds faxed to [place] Fax to [phone] Att: Dr Rodney Langton

## 2014-06-13 ENCOUNTER — Other Ambulatory Visit: Payer: Self-pay | Admitting: Internal Medicine

## 2014-06-27 ENCOUNTER — Telehealth: Payer: Self-pay | Admitting: *Deleted

## 2014-06-27 NOTE — Addendum Note (Signed)
Addended by: Hulan Fray on: 06/27/2014 07:02 PM   Modules accepted: Orders

## 2014-06-27 NOTE — Addendum Note (Signed)
Addended by: Yvonna Alanis E on: 06/27/2014 07:00 PM   Modules accepted: Orders

## 2014-06-27 NOTE — Telephone Encounter (Signed)
Pt's wife brought in bottle of oxycodone hcl 5 mg  Take one twice a day if needed for chronic backpain  # 60  She will need a photocopy of the Rx.  She also needs a note stating pt has been on Plavix for about 4 years with excellent results.  We would like him to remain on the drug. [facility] cou[facility] and she does not want to change. Please talk to [facility] about this. [place]

## 2014-07-02 MED ORDER — ALBUTEROL SULFATE HFA 108 (90 BASE) MCG/ACT IN AERS: INHALATION_SPRAY | RESPIRATORY_TRACT | Status: AC

## 2014-07-02 MED ORDER — HYDROCHLOROTHIAZIDE 25 MG PO TABS: 25.0000 mg | ORAL_TABLET | Freq: Every day | ORAL | Status: DC

## 2014-07-02 MED ORDER — OXYCODONE HCL 5 MG PO TABS: 5.0000 mg | ORAL_TABLET | Freq: Two times a day (BID) | ORAL | Status: DC | PRN

## 2014-07-30 ENCOUNTER — Telehealth: Payer: Self-pay | Admitting: *Deleted

## 2014-07-30 NOTE — Telephone Encounter (Signed)
Wife called clinic - was down on [place]  - pt fell 07/22/14 during the night - nasal area hit end table - pt on Plavix. Next night - did the same thing - pillow was wrapped around table. ER was 30 minutes away - never went. Back in town now - appt made 08/01/14 8:15AM Dr Redmond Pulling. Hilda Blades Fendi Meinhardt RN 07/30/14 3:45PM

## 2014-08-01 ENCOUNTER — Encounter: Payer: Self-pay | Admitting: Internal Medicine

## 2014-08-01 ENCOUNTER — Ambulatory Visit (INDEPENDENT_AMBULATORY_CARE_PROVIDER_SITE_OTHER): Payer: PPO | Admitting: Internal Medicine

## 2014-08-01 VITALS — BP 126/92 | HR 94 | Temp 97.8°F | Ht 72.0 in | Wt 184.5 lb

## 2014-08-01 DIAGNOSIS — J342 Deviated nasal septum: Secondary | ICD-10-CM | POA: Insufficient documentation

## 2014-08-01 MED ORDER — OXYCODONE HCL 5 MG PO TABS: 5.0000 mg | ORAL_TABLET | Freq: Two times a day (BID) | ORAL | Status: DC | PRN
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
## 2014-08-01 NOTE — Assessment & Plan Note (Addendum)
He fell out of bed and hit his face on the bedside stand 1.5 weeks ago.  Pt wife says there was a lot of blood coming from the nose.  No LOC.  Wife cleaned up the blood.  They were away from home and nearest hospital was 30 min away so no emergency care sought.  Patient is on Plavix.  He bled for about 5 hours.  No headache, change of vision, increased dyspnea, appetite change or change in behavior.  Pt and wife noticed nose now looks deviated left.  Taking oxycodone every other day for pain (usually only needs it every 3-4 days for chronic back pain). - CT maxillofacial to assess for fracture and to confirm deviated septum - if deviated septum will refer to ENT for treatment - advised patient to seek emergency help if new symptoms such as severe headache, difficulty breathing, rebleed - patient provided with refill of oxycodone 5mg  BID prn #60 no refills for chronic back pain, future prescribing per PCP and will need pain contract - follow-up with PCP on 08/30/14

## 2014-08-01 NOTE — Patient Instructions (Signed)
1. We will call you with your appt for CT.  If there is a deviated septum, I will refer you to ENT for further management.   2. Please take all medications as prescribed.     3. If you have worsening of your symptoms or new symptoms arise, please call the clinic ([phone]), or go to the ER immediately if symptoms are severe.

## 2014-08-02 NOTE — Progress Notes (Signed)
Case discussed with Dr. Redmond Pulling at the time of the visit.  We reviewed the resident's history and exam and pertinent patient test results.  I agree with the assessment, diagnosis and plan of care documented in the resident's note.

## 2014-08-30 ENCOUNTER — Ambulatory Visit (INDEPENDENT_AMBULATORY_CARE_PROVIDER_SITE_OTHER): Payer: PPO | Admitting: Internal Medicine

## 2014-08-30 ENCOUNTER — Encounter: Payer: Self-pay | Admitting: Internal Medicine

## 2014-08-30 DIAGNOSIS — J342 Deviated nasal septum: Secondary | ICD-10-CM | POA: Diagnosis not present

## 2014-08-30 DIAGNOSIS — Z87820 Personal history of traumatic brain injury: Secondary | ICD-10-CM

## 2014-08-30 DIAGNOSIS — G8929 Other chronic pain: Secondary | ICD-10-CM | POA: Diagnosis not present

## 2014-08-30 DIAGNOSIS — M549 Dorsalgia, unspecified: Secondary | ICD-10-CM | POA: Diagnosis not present

## 2014-08-30 MED ORDER — FLUTICASONE PROPIONATE 50 MCG/ACT NA SUSP: 1.0000 | Freq: Every day | NASAL | Status: DC

## 2014-08-30 MED ORDER — OXYCODONE HCL 5 MG PO TABS: 5.0000 mg | ORAL_TABLET | Freq: Two times a day (BID) | ORAL | Status: DC | PRN

## 2014-08-30 NOTE — Patient Instructions (Signed)
-   It was a pleasure seeing you today - I have referred you to ENT for likely deviated septum - I have also referred you back toneurology - I have prescribed flonase for nasal congestion - I have refilled your pain medication - Please follow up in 3 months

## 2014-08-30 NOTE — Assessment & Plan Note (Signed)
-   Patient with snoring now and obvious nasal deviation - Likely nasal fracture given recent fall - Insurance did not approve imaging - Will refer to ENT for follow up - Patient also with mild nasal congestion- will start flonase for now

## 2014-08-30 NOTE — Assessment & Plan Note (Signed)
-   Patient with persistent chronic back pain - c/w lidocaine patch and oxy IR for now - No warning signs or symptoms - Case d/w wife in detail

## 2014-08-30 NOTE — Assessment & Plan Note (Signed)
-   Patient still awaiting neurology follow up - Was referred again today to neuro - Will follow up on referral

## 2014-08-30 NOTE — Progress Notes (Signed)
   Subjective:    Patient ID: Todd [name], male    DOB: 1945/10/12, 69 y.o.   MRN: [id number]  HPI Patient seen and examined. He is here for routine follow up of his chronic back pain and his deviated nasal septum.  Patient had a fall approx 1 month ago and was seen at [facility] and a CT was ordered to evaluate for fracture. The CT was denied by the insurance company and he came for follow up. His wife notes that his nose is definitely deviated from prior and that he now snores (he never used to). She also notes that his nose is congested.   Wife also noted that patient has difficulty following commands after taking pain meds and worries that it may be too strong for him. I explained that he has tried NSAIDs and OTC meds and lidocaine patches with minimal relief and I am reluctant to switch him to another opiate. I offered to hold pain meds for now but wife would like to keep him on it for now.   Review of Systems  Constitutional: Negative.   HENT: Positive for congestion. Negative for drooling, ear discharge, ear pain, hearing loss, postnasal drip, rhinorrhea and sinus pressure.        Deviated nasal septum, snoring +  Eyes: Negative.   Cardiovascular: Negative.   Gastrointestinal: Negative.   Musculoskeletal: Positive for back pain. Negative for myalgias and arthralgias.  Skin: Negative.   Neurological: Positive for speech difficulty. Negative for dizziness, seizures, syncope, light-headedness and numbness.       Wife noted patient closes his left hand into a fist a lot but is able to open it on his own.   Psychiatric/Behavioral: Negative.        Objective:   Physical Exam  Constitutional: He is oriented to person, place, and time. He appears well-developed and well-nourished.  HENT:  Head: Normocephalic and atraumatic.  Eyes: Conjunctivae are normal.  Neck: Normal range of motion.  Cardiovascular: Normal rate and regular rhythm.   Pulmonary/Chest: Effort normal and breath sounds  normal. No respiratory distress. He has no wheezes.  Abdominal: Soft. Bowel sounds are normal. He exhibits no distension. There is no tenderness.  Musculoskeletal: He exhibits no edema or tenderness.  Left hand noted to be clenched into a fist but patient able to open it on his own. No new focal weakness  Neurological: He is alert and oriented to person, place, and time.  Skin: Skin is warm and dry.  Psychiatric: He has a normal mood and affect. His behavior is normal.          Assessment & Plan:  Please see problem based charting for assessment and plan:

## 2014-09-04 ENCOUNTER — Ambulatory Visit (HOSPITAL_COMMUNITY)
Admission: RE | Admit: 2014-09-04 | Discharge: 2014-09-04 | Disposition: A | Discharge status: Home / Self Care | Payer: PPO | Source: Ambulatory Visit | Attending: Internal Medicine | Admitting: Internal Medicine

## 2014-09-04 DIAGNOSIS — R0981 Nasal congestion: Secondary | ICD-10-CM | POA: Insufficient documentation

## 2014-09-04 DIAGNOSIS — J342 Deviated nasal septum: Secondary | ICD-10-CM | POA: Diagnosis not present

## 2014-09-04 DIAGNOSIS — G9389 Other specified disorders of brain: Secondary | ICD-10-CM | POA: Diagnosis not present

## 2014-09-04 DIAGNOSIS — W06XXXA Fall from bed, initial encounter: Secondary | ICD-10-CM | POA: Diagnosis not present

## 2014-09-19 NOTE — Progress Notes (Signed)
This encounter was created in error - please disregard.

## 2014-09-27 ENCOUNTER — Encounter (HOSPITAL_COMMUNITY)
Admission: RE | Admit: 2014-09-27 | Discharge: 2014-09-27 | Disposition: A | Discharge status: Home / Self Care | Payer: PPO | Source: Ambulatory Visit | Attending: Otolaryngology | Admitting: Otolaryngology

## 2014-09-27 ENCOUNTER — Encounter (HOSPITAL_COMMUNITY): Payer: Self-pay

## 2014-09-27 DIAGNOSIS — K279 Peptic ulcer, site unspecified, unspecified as acute or chronic, without hemorrhage or perforation: Secondary | ICD-10-CM | POA: Diagnosis not present

## 2014-09-27 DIAGNOSIS — Z885 Allergy status to narcotic agent status: Secondary | ICD-10-CM | POA: Diagnosis not present

## 2014-09-27 DIAGNOSIS — B1920 Unspecified viral hepatitis C without hepatic coma: Secondary | ICD-10-CM | POA: Diagnosis not present

## 2014-09-27 DIAGNOSIS — Z886 Allergy status to analgesic agent status: Secondary | ICD-10-CM | POA: Diagnosis not present

## 2014-09-27 DIAGNOSIS — I10 Essential (primary) hypertension: Secondary | ICD-10-CM | POA: Diagnosis not present

## 2014-09-27 DIAGNOSIS — Z79899 Other long term (current) drug therapy: Secondary | ICD-10-CM | POA: Diagnosis not present

## 2014-09-27 DIAGNOSIS — J449 Chronic obstructive pulmonary disease, unspecified: Secondary | ICD-10-CM | POA: Diagnosis not present

## 2014-09-27 DIAGNOSIS — K219 Gastro-esophageal reflux disease without esophagitis: Secondary | ICD-10-CM | POA: Diagnosis not present

## 2014-09-27 DIAGNOSIS — Z85828 Personal history of other malignant neoplasm of skin: Secondary | ICD-10-CM | POA: Diagnosis not present

## 2014-09-27 DIAGNOSIS — J342 Deviated nasal septum: Secondary | ICD-10-CM | POA: Diagnosis present

## 2014-09-27 DIAGNOSIS — Z87820 Personal history of traumatic brain injury: Secondary | ICD-10-CM | POA: Diagnosis not present

## 2014-09-27 HISTORY — DX: Chronic obstructive pulmonary disease, unspecified: J44.9

## 2014-09-27 LAB — BASIC METABOLIC PANEL
Anion gap: 10 (ref 5–15)
BUN: 18 mg/dL (ref 6–20)
CALCIUM: 9.4 mg/dL (ref 8.9–10.3)
CO2: 23 mmol/L (ref 22–32)
Chloride: 106 mmol/L (ref 101–111)
Creatinine, Ser: 0.99 mg/dL (ref 0.61–1.24)
GFR calc Af Amer: >60 mL/min (ref >60)
GFR calc non Af Amer: >60 mL/min (ref >60)
GLUCOSE: 101 mg/dL — AB (ref 65–99)
POTASSIUM: 3.8 mmol/L (ref 3.5–5.1)
Sodium: 139 mmol/L (ref 135–145)

## 2014-09-27 LAB — CBC
HCT: 46.2 % (ref 39.0–52.0)
Hemoglobin: 15.6 g/dL (ref 13.0–17.0)
MCH: 32.4 pg (ref 26.0–34.0)
MCHC: 33.8 g/dL (ref 30.0–36.0)
MCV: 95.9 fL (ref 78.0–100.0)
PLATELETS: 234 10*3/uL (ref 150–400)
RBC: 4.82 MIL/uL (ref 4.22–5.81)
RDW: 12.9 % (ref 11.5–15.5)
WBC: 8.5 10*3/uL (ref 4.0–10.5)

## 2014-09-27 NOTE — Pre-Procedure Instructions (Signed)
Todd [name]  09/27/2014      [facility] [place], [place] - [place] [address] Phone: [phone] Fax: [phone]    Your procedure is scheduled on Fri, June 10 @ 12:25 PM  Report to [facility] and report to Admitting at 10:15 AM  Call this number if you have problems the morning of surgery:  [phone]   Remember:  Do not eat food or drink liquids after midnight.  Take these medicines the morning of surgery with A SIP OF WATER:Albuterol<Bring Your Inhaler With You>,Symbicort(Budesonide),Cymbalta(Duloxetine),Flonase(Fluticasone),Mirabegron(Myrbetriq),Omeprazole(Prilosec),and Pain Pill(if needed)               Stop taking your Plavix as you have been instructed.No Goody's,BC's,Aleve,Aspirin,Ibuprofen,Fish Oil,or any Herbal Medications.    Do not wear jewelry.  Do not wear lotions, powders, or colognes.  You may wear deodorant.             Men may shave face and neck.  Do not bring valuables to the hospital.  [facility] is not responsible for any belongings or valuables.  Contacts, dentures or bridgework may not be worn into surgery.  Leave your suitcase in the car.  After surgery it may be brought to your room.  For patients admitted to the hospital, discharge time will be determined by your treatment team.  Patients discharged the day of surgery will not be allowed to drive home.   Special instructions:  [facility] - Preparing for Surgery  Before surgery, you can play an important role.  Because skin is not sterile, your skin needs to be as free of germs as possible.  You can reduce the number of germs on you skin by washing with CHG (chlorahexidine gluconate) soap before surgery.  CHG is an antiseptic cleaner which kills germs and bonds with the skin to continue killing germs even after washing.  Please DO NOT use if you have an allergy to CHG or antibacterial soaps.  If your  skin becomes reddened/irritated stop using the CHG and inform your nurse when you arrive at Short Stay.  Do not shave (including legs and underarms) for at least 48 hours prior to the first CHG shower.  You may shave your face.  Please follow these instructions carefully:   1.  Shower with CHG Soap the night before surgery and the                                morning of Surgery.  2.  If you choose to wash your hair, wash your hair first as usual with your       normal shampoo.  3.  After you shampoo, rinse your hair and body thoroughly to remove the                      Shampoo.  4.  Use CHG as you would any other liquid soap.  You can apply chg directly       to the skin and wash gently with scrungie or a clean washcloth.  5.  Apply the CHG Soap to your body ONLY FROM THE NECK DOWN.        Do not use on open wounds or open sores.  Avoid contact with your eyes,       ears, mouth and genitals (private parts).  Wash genitals (private parts)  with your normal soap.  6.  Wash thoroughly, paying special attention to the area where your surgery        will be performed.  7.  Thoroughly rinse your body with warm water from the neck down.  8.  DO NOT shower/wash with your normal soap after using and rinsing off       the CHG Soap.  9.  Pat yourself dry with a clean towel.            10.  Wear clean pajamas.            11.  Place clean sheets on your bed the night of your first shower and do not        sleep with pets.  Day of Surgery  Do not apply any lotions/deoderants the morning of surgery.  Please wear clean clothes to the hospital/surgery center.    Please read over the following fact sheets that you were given. Pain Booklet, Coughing and Deep Breathing and Surgical Site Infection Prevention

## 2014-10-01 ENCOUNTER — Other Ambulatory Visit: Payer: Self-pay | Admitting: *Deleted

## 2014-10-01 MED ORDER — OXYCODONE HCL 5 MG PO TABS: 5.0000 mg | ORAL_TABLET | Freq: Two times a day (BID) | ORAL | Status: DC | PRN

## 2014-10-01 NOTE — Telephone Encounter (Signed)
Pt scheduled for Septoplasty on 6/10 Last refill on med 5/5    Pt's wife called and states she was told to given pt pain med the morning of surgery. I called Dr. Janace Hoard office to confirm.  I talked with [employer] and she said pain med was not needed prior to procedure. Wife informed

## 2014-10-04 NOTE — Progress Notes (Signed)
Spoke with wife and she knew of time change from Dr. Janace Hoard' office.   Will be here at  9:00 AM

## 2014-10-05 ENCOUNTER — Observation Stay (HOSPITAL_COMMUNITY)
Admission: RE | Admit: 2014-10-05 | Discharge: 2014-10-06 | Disposition: A | Discharge status: Home / Self Care | Payer: PPO | Source: Ambulatory Visit | Attending: Otolaryngology | Admitting: Otolaryngology

## 2014-10-05 ENCOUNTER — Ambulatory Visit (HOSPITAL_COMMUNITY): Payer: PPO | Admitting: Anesthesiology

## 2014-10-05 ENCOUNTER — Encounter (HOSPITAL_COMMUNITY): Payer: Self-pay | Admitting: Anesthesiology

## 2014-10-05 ENCOUNTER — Encounter (HOSPITAL_COMMUNITY)
Admission: RE | Disposition: A | Discharge status: Home / Self Care | Payer: Self-pay | Source: Ambulatory Visit | Attending: Otolaryngology

## 2014-10-05 DIAGNOSIS — K219 Gastro-esophageal reflux disease without esophagitis: Secondary | ICD-10-CM | POA: Diagnosis not present

## 2014-10-05 DIAGNOSIS — Z85828 Personal history of other malignant neoplasm of skin: Secondary | ICD-10-CM | POA: Insufficient documentation

## 2014-10-05 DIAGNOSIS — I10 Essential (primary) hypertension: Secondary | ICD-10-CM | POA: Insufficient documentation

## 2014-10-05 DIAGNOSIS — B1920 Unspecified viral hepatitis C without hepatic coma: Secondary | ICD-10-CM | POA: Insufficient documentation

## 2014-10-05 DIAGNOSIS — Z886 Allergy status to analgesic agent status: Secondary | ICD-10-CM | POA: Insufficient documentation

## 2014-10-05 DIAGNOSIS — J449 Chronic obstructive pulmonary disease, unspecified: Secondary | ICD-10-CM | POA: Insufficient documentation

## 2014-10-05 DIAGNOSIS — Z79899 Other long term (current) drug therapy: Secondary | ICD-10-CM | POA: Insufficient documentation

## 2014-10-05 DIAGNOSIS — K279 Peptic ulcer, site unspecified, unspecified as acute or chronic, without hemorrhage or perforation: Secondary | ICD-10-CM | POA: Insufficient documentation

## 2014-10-05 DIAGNOSIS — J342 Deviated nasal septum: Secondary | ICD-10-CM | POA: Diagnosis not present

## 2014-10-05 DIAGNOSIS — Z87820 Personal history of traumatic brain injury: Secondary | ICD-10-CM | POA: Insufficient documentation

## 2014-10-05 DIAGNOSIS — Z885 Allergy status to narcotic agent status: Secondary | ICD-10-CM | POA: Insufficient documentation

## 2014-10-05 HISTORY — PX: SEPTOPLASTY: SHX2393

## 2014-10-05 HISTORY — PX: SEPTOPLASTY: SUR1290

## 2014-10-05 LAB — CBC
HCT: 44.1 % (ref 39.0–52.0)
Hemoglobin: 14.9 g/dL (ref 13.0–17.0)
MCH: 32.5 pg (ref 26.0–34.0)
MCHC: 33.8 g/dL (ref 30.0–36.0)
MCV: 96.1 fL (ref 78.0–100.0)
Platelets: 229 10*3/uL (ref 150–400)
RBC: 4.59 MIL/uL (ref 4.22–5.81)
RDW: 13.1 % (ref 11.5–15.5)
WBC: 8.6 10*3/uL (ref 4.0–10.5)

## 2014-10-05 LAB — CREATININE, SERUM
Creatinine, Ser: 0.96 mg/dL (ref 0.61–1.24)
GFR calc Af Amer: >60 mL/min (ref >60)
GFR calc non Af Amer: >60 mL/min (ref >60)

## 2014-10-05 SURGERY — SEPTOPLASTY, NOSE
Anesthesia: General | Site: Nose

## 2014-10-05 MED ORDER — FENTANYL CITRATE (PF) 250 MCG/5ML IJ SOLN
INTRAMUSCULAR | Status: AC
  Filled 2014-10-05: qty 5

## 2014-10-05 MED ORDER — HEPARIN SODIUM (PORCINE) 5000 UNIT/ML IJ SOLN: 5000.0000 [IU] | Freq: Three times a day (TID) | INTRAMUSCULAR | Status: DC

## 2014-10-05 MED ORDER — CEFAZOLIN SODIUM-DEXTROSE 2-3 GM-% IV SOLR
2.0000 g | Freq: Once | INTRAVENOUS | Status: AC
  Administered 2014-10-05: 2 g via INTRAVENOUS

## 2014-10-05 MED ORDER — EPINEPHRINE HCL 1 MG/ML IJ SOLN
INTRAMUSCULAR | Status: AC
  Filled 2014-10-05: qty 1

## 2014-10-05 MED ORDER — VECURONIUM BROMIDE 10 MG IV SOLR
INTRAVENOUS | Status: AC
  Filled 2014-10-05: qty 10

## 2014-10-05 MED ORDER — LIDOCAINE HCL (CARDIAC) 20 MG/ML IV SOLN
INTRAVENOUS | Status: DC | PRN
  Administered 2014-10-05: 40 mg via INTRAVENOUS

## 2014-10-05 MED ORDER — ONDANSETRON HCL 4 MG/2ML IJ SOLN
INTRAMUSCULAR | Status: DC | PRN
  Administered 2014-10-05: 4 mg via INTRAVENOUS

## 2014-10-05 MED ORDER — GLYCOPYRROLATE 0.2 MG/ML IJ SOLN
INTRAMUSCULAR | Status: AC
  Filled 2014-10-05: qty 3

## 2014-10-05 MED ORDER — CEPHALEXIN 500 MG PO CAPS
500.0000 mg | ORAL_CAPSULE | Freq: Three times a day (TID) | ORAL | Status: DC
  Administered 2014-10-05 – 2014-10-06 (×3): 500 mg via ORAL
  Filled 2014-10-05 (×3): qty 1

## 2014-10-05 MED ORDER — VECURONIUM BROMIDE 10 MG IV SOLR
INTRAVENOUS | Status: DC | PRN
  Administered 2014-10-05: 3 mg via INTRAVENOUS

## 2014-10-05 MED ORDER — CLOPIDOGREL BISULFATE 75 MG PO TABS
75.0000 mg | ORAL_TABLET | Freq: Every morning | ORAL | Status: DC
  Administered 2014-10-06: 75 mg via ORAL
  Filled 2014-10-05: qty 1

## 2014-10-05 MED ORDER — LACTATED RINGERS IV SOLN
INTRAVENOUS | Status: DC
Start: 2014-10-05 — End: 2014-10-06
  Administered 2014-10-05: 11:00:00 via INTRAVENOUS

## 2014-10-05 MED ORDER — LIDOCAINE-EPINEPHRINE 1 %-1:100000 IJ SOLN
INTRAMUSCULAR | Status: DC | PRN
  Administered 2014-10-05: 5 mL

## 2014-10-05 MED ORDER — FENTANYL CITRATE (PF) 100 MCG/2ML IJ SOLN
INTRAMUSCULAR | Status: DC | PRN
Start: 2014-10-05 — End: 2014-10-05
  Administered 2014-10-05: 50 ug via INTRAVENOUS

## 2014-10-05 MED ORDER — NEOSTIGMINE METHYLSULFATE 10 MG/10ML IV SOLN
INTRAVENOUS | Status: DC | PRN
  Administered 2014-10-05: 5 mg via INTRAVENOUS

## 2014-10-05 MED ORDER — STARCH (THICKENING) PO POWD
ORAL | Status: DC | PRN
  Filled 2014-10-05: qty 227

## 2014-10-05 MED ORDER — EPHEDRINE SULFATE 50 MG/ML IJ SOLN
INTRAMUSCULAR | Status: DC | PRN
  Administered 2014-10-05 (×2): 5 mg via INTRAVENOUS

## 2014-10-05 MED ORDER — FLUTICASONE PROPIONATE 50 MCG/ACT NA SUSP
1.0000 | Freq: Every day | NASAL | Status: DC
  Filled 2014-10-05: qty 16

## 2014-10-05 MED ORDER — HYDROMORPHONE HCL 1 MG/ML IJ SOLN
INTRAMUSCULAR | Status: AC
  Filled 2014-10-05: qty 1

## 2014-10-05 MED ORDER — ALBUTEROL SULFATE (2.5 MG/3ML) 0.083% IN NEBU: 2.5000 mg | INHALATION_SOLUTION | Freq: Four times a day (QID) | RESPIRATORY_TRACT | Status: DC | PRN

## 2014-10-05 MED ORDER — DEXAMETHASONE SODIUM PHOSPHATE 4 MG/ML IJ SOLN
INTRAMUSCULAR | Status: AC
  Filled 2014-10-05: qty 1

## 2014-10-05 MED ORDER — GLYCOPYRROLATE 0.2 MG/ML IJ SOLN
INTRAMUSCULAR | Status: DC | PRN
  Administered 2014-10-05: 0.6 mg via INTRAVENOUS

## 2014-10-05 MED ORDER — ONDANSETRON HCL 4 MG/2ML IJ SOLN: 4.0000 mg | Freq: Four times a day (QID) | INTRAMUSCULAR | Status: DC | PRN

## 2014-10-05 MED ORDER — CEPHALEXIN 500 MG PO CAPS: 500.0000 mg | ORAL_CAPSULE | Freq: Three times a day (TID) | ORAL | Status: DC

## 2014-10-05 MED ORDER — DEXTROSE-NACL 5-0.45 % IV SOLN
INTRAVENOUS | Status: DC
  Administered 2014-10-05: 100 mL/h via INTRAVENOUS
  Administered 2014-10-06: 01:00:00 via INTRAVENOUS

## 2014-10-05 MED ORDER — ALBUTEROL SULFATE HFA 108 (90 BASE) MCG/ACT IN AERS: 1.0000 | INHALATION_SPRAY | RESPIRATORY_TRACT | Status: DC | PRN

## 2014-10-05 MED ORDER — BACITRACIN ZINC 500 UNIT/GM EX OINT
TOPICAL_OINTMENT | CUTANEOUS | Status: AC
Start: 2014-10-05 — End: 2014-10-05
  Filled 2014-10-05: qty 28.35

## 2014-10-05 MED ORDER — STERILE WATER FOR INJECTION IJ SOLN
INTRAMUSCULAR | Status: AC
  Filled 2014-10-05: qty 10

## 2014-10-05 MED ORDER — 0.9 % SODIUM CHLORIDE (POUR BTL) OPTIME
TOPICAL | Status: DC | PRN
  Administered 2014-10-05: 1000 mL

## 2014-10-05 MED ORDER — PANTOPRAZOLE SODIUM 40 MG PO TBEC
40.0000 mg | DELAYED_RELEASE_TABLET | Freq: Every day | ORAL | Status: DC
  Administered 2014-10-06: 40 mg via ORAL
  Filled 2014-10-05: qty 1

## 2014-10-05 MED ORDER — HYDROCHLOROTHIAZIDE 25 MG PO TABS
25.0000 mg | ORAL_TABLET | Freq: Every day | ORAL | Status: DC
  Administered 2014-10-05 – 2014-10-06 (×2): 25 mg via ORAL
  Filled 2014-10-05 (×2): qty 1

## 2014-10-05 MED ORDER — PROPOFOL 10 MG/ML IV BOLUS
INTRAVENOUS | Status: AC
  Filled 2014-10-05: qty 20

## 2014-10-05 MED ORDER — ONDANSETRON HCL 4 MG/2ML IJ SOLN
INTRAMUSCULAR | Status: AC
  Filled 2014-10-05: qty 2

## 2014-10-05 MED ORDER — CEFAZOLIN SODIUM-DEXTROSE 2-3 GM-% IV SOLR
INTRAVENOUS | Status: AC
  Filled 2014-10-05: qty 50

## 2014-10-05 MED ORDER — OXYMETAZOLINE HCL 0.05 % NA SOLN
NASAL | Status: AC
  Filled 2014-10-05: qty 15

## 2014-10-05 MED ORDER — DEXAMETHASONE SODIUM PHOSPHATE 4 MG/ML IJ SOLN
INTRAMUSCULAR | Status: DC | PRN
  Administered 2014-10-05: 4 mg via INTRAVENOUS

## 2014-10-05 MED ORDER — DULOXETINE HCL 60 MG PO CPEP
60.0000 mg | ORAL_CAPSULE | Freq: Every day | ORAL | Status: DC
  Administered 2014-10-05 – 2014-10-06 (×2): 60 mg via ORAL
  Filled 2014-10-05 (×2): qty 1

## 2014-10-05 MED ORDER — SODIUM CHLORIDE 0.9 % IJ SOLN
INTRAMUSCULAR | Status: AC
  Filled 2014-10-05: qty 10

## 2014-10-05 MED ORDER — PROPOFOL 10 MG/ML IV BOLUS
INTRAVENOUS | Status: DC | PRN
  Administered 2014-10-05: 100 mg via INTRAVENOUS

## 2014-10-05 MED ORDER — NEOSTIGMINE METHYLSULFATE 10 MG/10ML IV SOLN
INTRAVENOUS | Status: AC
  Filled 2014-10-05: qty 1

## 2014-10-05 MED ORDER — LIDOCAINE HCL (CARDIAC) 20 MG/ML IV SOLN
INTRAVENOUS | Status: AC
  Filled 2014-10-05: qty 5

## 2014-10-05 MED ORDER — BUDESONIDE-FORMOTEROL FUMARATE 160-4.5 MCG/ACT IN AERO
2.0000 | INHALATION_SPRAY | Freq: Two times a day (BID) | RESPIRATORY_TRACT | Status: DC
  Administered 2014-10-05 – 2014-10-06 (×2): 2 via RESPIRATORY_TRACT
  Filled 2014-10-05: qty 6

## 2014-10-05 MED ORDER — HEPARIN SODIUM (PORCINE) 5000 UNIT/ML IJ SOLN
5000.0000 [IU] | Freq: Three times a day (TID) | INTRAMUSCULAR | Status: DC
  Administered 2014-10-06: 5000 [IU] via SUBCUTANEOUS
  Filled 2014-10-05: qty 1

## 2014-10-05 MED ORDER — GUAIFENESIN 200 MG PO TABS
400.0000 mg | ORAL_TABLET | Freq: Every day | ORAL | Status: DC
  Administered 2014-10-05 – 2014-10-06 (×2): 400 mg via ORAL
  Filled 2014-10-05 (×2): qty 2

## 2014-10-05 MED ORDER — MIRABEGRON ER 50 MG PO TB24
50.0000 mg | ORAL_TABLET | Freq: Every day | ORAL | Status: DC
  Administered 2014-10-05 – 2014-10-06 (×2): 50 mg via ORAL
  Filled 2014-10-05 (×2): qty 1

## 2014-10-05 MED ORDER — EPHEDRINE SULFATE 50 MG/ML IJ SOLN
INTRAMUSCULAR | Status: AC
  Filled 2014-10-05: qty 1

## 2014-10-05 MED ORDER — LIDOCAINE-EPINEPHRINE 1 %-1:100000 IJ SOLN
INTRAMUSCULAR | Status: AC
  Filled 2014-10-05: qty 1

## 2014-10-05 MED ORDER — LIDOCAINE HCL (PF) 4 % IJ SOLN
INTRAMUSCULAR | Status: AC
  Filled 2014-10-05: qty 5

## 2014-10-05 MED ORDER — HYDROMORPHONE HCL 1 MG/ML IJ SOLN
0.2500 mg | INTRAMUSCULAR | Status: DC | PRN
  Administered 2014-10-05 (×3): 0.5 mg via INTRAVENOUS

## 2014-10-05 MED ORDER — OXYCODONE HCL 5 MG PO TABS
5.0000 mg | ORAL_TABLET | Freq: Two times a day (BID) | ORAL | Status: DC | PRN
  Administered 2014-10-05: 5 mg via ORAL
  Filled 2014-10-05: qty 1

## 2014-10-05 MED ORDER — LACTATED RINGERS IV SOLN
INTRAVENOUS | Status: DC | PRN
  Administered 2014-10-05: 10:00:00 via INTRAVENOUS

## 2014-10-05 MED ORDER — OXYMETAZOLINE HCL 0.05 % NA SOLN
NASAL | Status: DC | PRN
  Administered 2014-10-05: 1 via TOPICAL

## 2014-10-05 MED ORDER — ADULT MULTIVITAMIN W/MINERALS CH
1.0000 | ORAL_TABLET | Freq: Every morning | ORAL | Status: DC
  Administered 2014-10-06: 1 via ORAL
  Filled 2014-10-05: qty 1

## 2014-10-05 SURGICAL SUPPLY — 35 items
BLADE 10 SAFETY STRL DISP (BLADE) ×2 IMPLANT
BLADE SURG 15 STRL LF DISP TIS (BLADE) ×1 IMPLANT
BLADE SURG 15 STRL SS (BLADE) ×1
CANISTER SUCTION 2500CC (MISCELLANEOUS) ×2 IMPLANT
COAGULATOR SUCT SWTCH 10FR 6 (ELECTROSURGICAL) IMPLANT
COVER MAYO STAND STRL (DRAPES) ×2 IMPLANT
CRADLE DONUT ADULT HEAD (MISCELLANEOUS) IMPLANT
DRAPE PROXIMA HALF (DRAPES) ×2 IMPLANT
DRESSING TELFA 8X3 (GAUZE/BANDAGES/DRESSINGS) IMPLANT
ELECT COATED BLADE 2.86 ST (ELECTRODE) ×2 IMPLANT
ELECT REM PT RETURN 9FT ADLT (ELECTROSURGICAL) ×2
ELECTRODE REM PT RTRN 9FT ADLT (ELECTROSURGICAL) ×1 IMPLANT
GLOVE BIOGEL PI IND STRL 7.0 (GLOVE) ×1 IMPLANT
GLOVE BIOGEL PI INDICATOR 7.0 (GLOVE) ×1
GLOVE ECLIPSE 7.5 STRL STRAW (GLOVE) ×2 IMPLANT
GLOVE SURG SS PI 7.0 STRL IVOR (GLOVE) ×4 IMPLANT
GOWN STRL REUS W/ TWL LRG LVL3 (GOWN DISPOSABLE) ×2 IMPLANT
GOWN STRL REUS W/TWL LRG LVL3 (GOWN DISPOSABLE) ×2
KIT BASIN OR (CUSTOM PROCEDURE TRAY) ×2 IMPLANT
KIT ROOM TURNOVER OR (KITS) ×2 IMPLANT
NEEDLE HYPO 25GX1X1/2 BEV (NEEDLE) ×2 IMPLANT
NS IRRIG 1000ML POUR BTL (IV SOLUTION) ×2 IMPLANT
PAD ARMBOARD 7.5X6 YLW CONV (MISCELLANEOUS) ×4 IMPLANT
PATTIES SURGICAL .5 X3 (DISPOSABLE) ×2 IMPLANT
PENCIL FOOT CONTROL (ELECTRODE) ×2 IMPLANT
SOLUTION ANTI FOG 6CC (MISCELLANEOUS) ×2 IMPLANT
SPECIMEN JAR SMALL (MISCELLANEOUS) IMPLANT
SUT CHROMIC 4 0 PS 2 18 (SUTURE) ×2 IMPLANT
SUT ETHILON 3 0 PS 1 (SUTURE) ×2 IMPLANT
SUT PLAIN 4 0 ~~LOC~~ 1 (SUTURE) ×2 IMPLANT
SUT VIC AB 3-0 SH 27 (SUTURE)
SUT VIC AB 3-0 SH 27X BRD (SUTURE) IMPLANT
TOWEL OR 17X24 6PK STRL BLUE (TOWEL DISPOSABLE) ×2 IMPLANT
TRAY ENT MC OR (CUSTOM PROCEDURE TRAY) ×2 IMPLANT
WATER STERILE IRR 1000ML POUR (IV SOLUTION) ×2 IMPLANT

## 2014-10-05 NOTE — Anesthesia Procedure Notes (Signed)
Procedure Name: Intubation Date/Time: 10/05/2014 11:22 AM Performed by: Luciana Axe K Pre-anesthesia Checklist: Patient identified, Emergency Drugs available, Suction available and Timeout performed Patient Re-evaluated:Patient Re-evaluated prior to inductionOxygen Delivery Method: Circle system utilized Preoxygenation: Pre-oxygenation with 100% oxygen Intubation Type: IV induction Ventilation: Mask ventilation without difficulty Laryngoscope Size: Miller and 3 Grade View: Grade I Tube type: Oral Tube size: 7.5 mm Number of attempts: 1 Airway Equipment and Method: Stylet Placement Confirmation: ETT inserted through vocal cords under direct vision,  positive ETCO2,  CO2 detector and breath sounds checked- equal and bilateral Secured at: 22 cm Tube secured with: Tape Dental Injury: Teeth and Oropharynx as per pre-operative assessment

## 2014-10-05 NOTE — Anesthesia Postprocedure Evaluation (Signed)
  Anesthesia Post-op Note  Patient: Todd [name]  Procedure(s) Performed: Procedure(s): SEPTOPLASTY (N/A)  Patient Location: PACU  Anesthesia Type:General  Level of Consciousness: awake and alert   Airway and Oxygen Therapy: Patient Spontanous Breathing  Post-op Pain: moderate  Post-op Assessment: Post-op Vital signs reviewed, [name] Cardiovascular Status Stable and Respiratory Function Stable  Post-op Vital Signs: Reviewed  Filed Vitals:   10/05/14 1258  BP:   Pulse: 75  Temp:   Resp: 11    Complications: No apparent anesthesia complications
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
## 2014-10-05 NOTE — Anesthesia Preprocedure Evaluation (Addendum)
Anesthesia Evaluation  Patient identified by MRN, date of birth, ID band Patient awake    Reviewed: Allergy & Precautions, H&P , NPO status , [name] Chart, lab work & pertinent test results  Airway Mallampati: II  TM Distance: >3 FB Neck ROM: Full    Dental no notable dental hx. (+) Teeth Intact, Dental Advisory Given   Pulmonary COPD COPD inhaler,  breath sounds clear to auscultation  Pulmonary exam normal       Cardiovascular hypertension, Pt. on medications Rhythm:Regular Rate:Normal     Neuro/Psych Seizures -, Well Controlled,  Depression TBI negative psych ROS   GI/Hepatic GERD-  Medicated and Controlled,(+) Hepatitis -  Endo/Other  negative endocrine ROS  Renal/GU negative Renal ROS  negative genitourinary   Musculoskeletal   Abdominal   Peds  Hematology negative hematology ROS (+)   Anesthesia Other Findings   Reproductive/Obstetrics negative OB ROS                             Anesthesia Physical Anesthesia Plan  ASA: III  Anesthesia Plan: General   Post-op Pain Management:    Induction: Intravenous  Airway Management Planned: Oral ETT  Additional Equipment:   Intra-op Plan:   Post-operative Plan: Extubation in OR  Informed Consent: I have reviewed the patients History and Physical, chart, labs and discussed the procedure including the risks, benefits and alternatives for the proposed anesthesia with the patient or authorized representative who has indicated his/her understanding and acceptance.   Dental advisory given  Plan Discussed with: CRNA  Anesthesia Plan Comments:         Anesthesia Quick Evaluation
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
## 2014-10-05 NOTE — Transfer of Care (Signed)
Immediate Anesthesia Transfer of Care Note  Patient: Todd [name]  Procedure(s) Performed: Procedure(s): SEPTOPLASTY (N/A)  Patient Location: PACU  Anesthesia Type:General  Level of Consciousness: sedated and patient cooperative  Airway & Oxygen Therapy: Patient Spontanous Breathing  Post-op Assessment: Report given to RN and Post -op Vital signs reviewed and stable  Post vital signs: Reviewed  Last Vitals:  Filed Vitals:   10/05/14 0912  BP: 131/89  Pulse: 78  Temp: 36.6 C  Resp: 18    Complications: No apparent anesthesia complications

## 2014-10-06 DIAGNOSIS — J342 Deviated nasal septum: Secondary | ICD-10-CM | POA: Diagnosis not present

## 2014-10-06 MED ORDER — OXYCODONE HCL 5 MG PO TABS
5.0000 mg | ORAL_TABLET | Freq: Once | ORAL | Status: AC
  Administered 2014-10-06: 5 mg via ORAL
  Filled 2014-10-06: qty 1

## 2014-10-06 MED ORDER — OXYCODONE HCL 5 MG PO TABS: 5.0000 mg | ORAL_TABLET | ORAL | Status: DC | PRN

## 2014-10-06 NOTE — Progress Notes (Signed)
Pt to be d/c today to home address.   Plan transfer via EMS.    [facility]  2S, 4M,3S, 5N, 6N [phone]

## 2014-10-06 NOTE — Progress Notes (Signed)
Discharge home. Home discharge instruction given to wife, no question verbalized. SW arranged transport with [facility].

## 2014-10-06 NOTE — Discharge Summary (Signed)
Physician Discharge Summary  Patient ID: Todd [name] MRN: [id number] DOB/AGE: [birth year] 67 y.o.  Admit date: 10/05/2014 Discharge date: 10/06/2014  Admission Diagnoses: Deviated septum  Discharge Diagnoses:  Active Problems:   Deviated septum   Discharged Condition: good  Hospital Course: 69 year old male underwent septoplasty, see operative note.  He was observed overnight and had some bleeding and some pain, as expected, but did well.  He was felt stable for discharge the next day.  Consults: None  Significant Diagnostic Studies: None  Treatments: surgery: Septoplasty  Discharge Exam: Blood pressure 101/68, pulse 99, temperature 97.6 F (36.4 C), temperature source Oral, resp. rate 20, height 6' (1.829 m), weight 81.194 kg (179 lb), SpO2 93 %. General appearance: alert, cooperative and no distress Nose: No significant nasal bleeding.  Disposition: 03-Skilled Nursing Facility  Discharge Instructions    Diet - low sodium heart healthy    Complete by:  As directed      Discharge instructions    Complete by:  As directed   Avoid nose blowing.  Use saline spray in each nostril every 2-4 hours while awake.  If needed, change gauze pad under nose to catch bleeding.  Bleeding should decrease over a couple of days.     Increase activity slowly    Complete by:  As directed             Medication List    TAKE these medications        albuterol (2.5 MG/3ML) 0.083% nebulizer solution  Commonly known as:  PROVENTIL  Take 2.5 mg by nebulization every 6 (six) hours as needed for wheezing or shortness of breath.     albuterol 108 (90 BASE) MCG/ACT inhaler  Commonly known as:  PROAIR HFA  inhale 2 puffs INTO THE LUNGS every 6 hours if needed for wheezing     budesonide-formoterol 160-4.5 MCG/ACT inhaler  Commonly known as:  SYMBICORT  Inhale 2 puffs into the lungs 2 (two) times daily.     cephALEXin 500 MG capsule  Commonly known as:  KEFLEX  Take 1 capsule (500 mg  total) by mouth 3 (three) times daily.     clopidogrel 75 MG tablet  Commonly known as:  PLAVIX  Take 1 tablet (75 mg total) by mouth every morning.     desonide 0.05 % cream  Commonly known as:  DESOWEN  Apply 1 application topically 2 (two) times daily.     DULoxetine 60 MG capsule  Commonly known as:  CYMBALTA  take 1 capsule by mouth once daily     fluticasone 50 MCG/ACT nasal spray  Commonly known as:  FLONASE  Place 1 spray into both nostrils daily.     guaiFENesin 200 MG tablet  Take 400 mg by mouth daily.     hydrochlorothiazide 25 MG tablet  Commonly known as:  HYDRODIURIL  Take 1 tablet (25 mg total) by mouth daily.     lidocaine 5 %  Commonly known as:  LIDODERM  Place 1 patch onto the skin every 12 (twelve) hours. Remove & Discard patch within 12 hours or as directed by MD     mirabegron ER 50 MG Tb24 tablet  Commonly known as:  MYRBETRIQ  Take 1 tablet (50 mg total) by mouth daily.     multivitamin with minerals Tabs tablet  Take 1 tablet by mouth every morning.     omeprazole 40 MG capsule  Commonly known as:  PRILOSEC  take 1 capsule by mouth  every morning     oxyCODONE 5 MG immediate release tablet  Commonly known as:  Oxy IR/ROXICODONE  Take 1 tablet (5 mg total) by mouth 2 (two) times daily as needed for severe pain.     oxyCODONE 5 MG immediate release tablet  Commonly known as:  Oxy IR/ROXICODONE  Take 1 tablet (5 mg total) by mouth every 4 (four) hours as needed for severe pain.           Follow-up Information    Follow up with [name], MD. Schedule an appointment as soon as possible for a visit in 1 week.   Specialty:  Otolaryngology   Contact information:   [address] [place] [address] [phone]       Signed: Melida Quitter 10/06/2014, 8:07 AM

## 2014-10-08 ENCOUNTER — Encounter (HOSPITAL_COMMUNITY): Payer: Self-pay | Admitting: Otolaryngology

## 2014-10-16 ENCOUNTER — Other Ambulatory Visit: Payer: Self-pay | Admitting: Internal Medicine

## 2014-10-23 ENCOUNTER — Observation Stay (HOSPITAL_COMMUNITY)
Admission: EM | Admit: 2014-10-23 | Discharge: 2014-10-25 | Disposition: A | Discharge status: Home / Self Care | Payer: PPO | Attending: Oncology | Admitting: Oncology

## 2014-10-23 ENCOUNTER — Emergency Department (HOSPITAL_COMMUNITY): Payer: PPO

## 2014-10-23 DIAGNOSIS — G8929 Other chronic pain: Secondary | ICD-10-CM | POA: Diagnosis not present

## 2014-10-23 DIAGNOSIS — R1310 Dysphagia, unspecified: Secondary | ICD-10-CM | POA: Insufficient documentation

## 2014-10-23 DIAGNOSIS — M549 Dorsalgia, unspecified: Secondary | ICD-10-CM

## 2014-10-23 DIAGNOSIS — I10 Essential (primary) hypertension: Secondary | ICD-10-CM | POA: Diagnosis not present

## 2014-10-23 DIAGNOSIS — F329 Major depressive disorder, single episode, unspecified: Secondary | ICD-10-CM | POA: Insufficient documentation

## 2014-10-23 DIAGNOSIS — K279 Peptic ulcer, site unspecified, unspecified as acute or chronic, without hemorrhage or perforation: Secondary | ICD-10-CM | POA: Insufficient documentation

## 2014-10-23 DIAGNOSIS — R569 Unspecified convulsions: Secondary | ICD-10-CM | POA: Diagnosis not present

## 2014-10-23 DIAGNOSIS — K219 Gastro-esophageal reflux disease without esophagitis: Secondary | ICD-10-CM | POA: Diagnosis not present

## 2014-10-23 DIAGNOSIS — Z885 Allergy status to narcotic agent status: Secondary | ICD-10-CM | POA: Diagnosis not present

## 2014-10-23 DIAGNOSIS — B1920 Unspecified viral hepatitis C without hepatic coma: Secondary | ICD-10-CM | POA: Insufficient documentation

## 2014-10-23 DIAGNOSIS — R0602 Shortness of breath: Secondary | ICD-10-CM | POA: Diagnosis not present

## 2014-10-23 DIAGNOSIS — R0781 Pleurodynia: Secondary | ICD-10-CM | POA: Diagnosis not present

## 2014-10-23 DIAGNOSIS — I440 Atrioventricular block, first degree: Secondary | ICD-10-CM | POA: Diagnosis not present

## 2014-10-23 DIAGNOSIS — R32 Unspecified urinary incontinence: Secondary | ICD-10-CM | POA: Diagnosis not present

## 2014-10-23 DIAGNOSIS — I252 Old myocardial infarction: Secondary | ICD-10-CM | POA: Diagnosis not present

## 2014-10-23 DIAGNOSIS — E785 Hyperlipidemia, unspecified: Secondary | ICD-10-CM | POA: Diagnosis not present

## 2014-10-23 DIAGNOSIS — J449 Chronic obstructive pulmonary disease, unspecified: Secondary | ICD-10-CM | POA: Insufficient documentation

## 2014-10-23 DIAGNOSIS — R079 Chest pain, unspecified: Principal | ICD-10-CM | POA: Insufficient documentation

## 2014-10-23 DIAGNOSIS — Z7902 Long term (current) use of antithrombotics/antiplatelets: Secondary | ICD-10-CM | POA: Diagnosis not present

## 2014-10-23 DIAGNOSIS — Z87820 Personal history of traumatic brain injury: Secondary | ICD-10-CM

## 2014-10-23 DIAGNOSIS — R42 Dizziness and giddiness: Secondary | ICD-10-CM | POA: Diagnosis not present

## 2014-10-23 DIAGNOSIS — R52 Pain, unspecified: Secondary | ICD-10-CM

## 2014-10-23 DIAGNOSIS — I2510 Atherosclerotic heart disease of native coronary artery without angina pectoris: Secondary | ICD-10-CM | POA: Insufficient documentation

## 2014-10-23 DIAGNOSIS — Z886 Allergy status to analgesic agent status: Secondary | ICD-10-CM | POA: Insufficient documentation

## 2014-10-23 DIAGNOSIS — I2699 Other pulmonary embolism without acute cor pulmonale: Secondary | ICD-10-CM

## 2014-10-23 DIAGNOSIS — R159 Full incontinence of feces: Secondary | ICD-10-CM | POA: Insufficient documentation

## 2014-10-23 DIAGNOSIS — Z79899 Other long term (current) drug therapy: Secondary | ICD-10-CM | POA: Diagnosis not present

## 2014-10-23 DIAGNOSIS — T17908A Unspecified foreign body in respiratory tract, part unspecified causing other injury, initial encounter: Secondary | ICD-10-CM | POA: Diagnosis present

## 2014-10-23 DIAGNOSIS — Z85828 Personal history of other malignant neoplasm of skin: Secondary | ICD-10-CM | POA: Diagnosis not present

## 2014-10-23 LAB — CBC WITH DIFFERENTIAL/PLATELET
BASOS ABS: 0 10*3/uL (ref 0.0–0.1)
Basophils Relative: 0 % (ref 0–1)
Eosinophils Absolute: 0.1 10*3/uL (ref 0.0–0.7)
Eosinophils Relative: 2 % (ref 0–5)
HCT: 42.6 % (ref 39.0–52.0)
Hemoglobin: 14.8 g/dL (ref 13.0–17.0)
Lymphocytes Relative: 23 % (ref 12–46)
Lymphs Abs: 2 10*3/uL (ref 0.7–4.0)
MCH: 32.8 pg (ref 26.0–34.0)
MCHC: 34.7 g/dL (ref 30.0–36.0)
MCV: 94.5 fL (ref 78.0–100.0)
Monocytes Absolute: 0.8 10*3/uL (ref 0.1–1.0)
Monocytes Relative: 9 % (ref 3–12)
NEUTROS ABS: 5.9 10*3/uL (ref 1.7–7.7)
NEUTROS PCT: 66 % (ref 43–77)
PLATELETS: 304 10*3/uL (ref 150–400)
RBC: 4.51 MIL/uL (ref 4.22–5.81)
RDW: 13.1 % (ref 11.5–15.5)
WBC: 8.8 10*3/uL (ref 4.0–10.5)

## 2014-10-23 LAB — BASIC METABOLIC PANEL
Anion gap: 9 (ref 5–15)
BUN: 24 mg/dL — ABNORMAL HIGH (ref 6–20)
CO2: 26 mmol/L (ref 22–32)
Calcium: 8.9 mg/dL (ref 8.9–10.3)
Chloride: 100 mmol/L — ABNORMAL LOW (ref 101–111)
Creatinine, Ser: 1.11 mg/dL (ref 0.61–1.24)
GFR calc Af Amer: >60 mL/min (ref >60)
GFR calc non Af Amer: >60 mL/min (ref >60)
GLUCOSE: 108 mg/dL — AB (ref 65–99)
Potassium: 4.6 mmol/L (ref 3.5–5.1)
SODIUM: 135 mmol/L (ref 135–145)

## 2014-10-23 LAB — I-STAT TROPONIN, ED: Troponin i, poc: 0 ng/mL (ref 0.00–0.08)

## 2014-10-23 MED ORDER — ASPIRIN 81 MG PO CHEW
324.0000 mg | CHEWABLE_TABLET | Freq: Once | ORAL | Status: AC
  Administered 2014-10-23: 324 mg via ORAL
  Filled 2014-10-23: qty 4
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
## 2014-10-23 NOTE — ED Notes (Signed)
Per EMS- pt c.o. Chest pain for about an hour that started when he was sitting in a chair. Wife reports pt has hx of blockage in same area. Pt has hx of TBI and has difficulty communicating. Pt not allowed to take ASA per his MD. No nitro given. No IV access. SR on monitor. O2 96% on room air. 112/68 HR 76. Wife is on the way. Ambulatory with assistance.

## 2014-10-23 NOTE — ED Notes (Signed)
Pt reports chest pain is worse with deep breaths. Pt had recent surgery. Pt left breath sound abnormal sound on expiration noted. Right lung sounds normal.

## 2014-10-24 ENCOUNTER — Encounter (HOSPITAL_COMMUNITY): Payer: Self-pay | Admitting: *Deleted

## 2014-10-24 ENCOUNTER — Observation Stay (HOSPITAL_COMMUNITY): Payer: PPO

## 2014-10-24 ENCOUNTER — Other Ambulatory Visit: Payer: Self-pay

## 2014-10-24 ENCOUNTER — Emergency Department (HOSPITAL_COMMUNITY): Payer: PPO

## 2014-10-24 DIAGNOSIS — F329 Major depressive disorder, single episode, unspecified: Secondary | ICD-10-CM

## 2014-10-24 DIAGNOSIS — Z79899 Other long term (current) drug therapy: Secondary | ICD-10-CM

## 2014-10-24 DIAGNOSIS — E785 Hyperlipidemia, unspecified: Secondary | ICD-10-CM | POA: Diagnosis not present

## 2014-10-24 DIAGNOSIS — S301XXA Contusion of abdominal wall, initial encounter: Secondary | ICD-10-CM

## 2014-10-24 DIAGNOSIS — M545 Low back pain: Secondary | ICD-10-CM | POA: Diagnosis not present

## 2014-10-24 DIAGNOSIS — I25119 Atherosclerotic heart disease of native coronary artery with unspecified angina pectoris: Secondary | ICD-10-CM | POA: Diagnosis not present

## 2014-10-24 DIAGNOSIS — R0789 Other chest pain: Secondary | ICD-10-CM

## 2014-10-24 DIAGNOSIS — R931 Abnormal findings on diagnostic imaging of heart and coronary circulation: Secondary | ICD-10-CM

## 2014-10-24 DIAGNOSIS — W19XXXA Unspecified fall, initial encounter: Secondary | ICD-10-CM

## 2014-10-24 DIAGNOSIS — K219 Gastro-esophageal reflux disease without esophagitis: Secondary | ICD-10-CM | POA: Diagnosis not present

## 2014-10-24 DIAGNOSIS — I2510 Atherosclerotic heart disease of native coronary artery without angina pectoris: Secondary | ICD-10-CM | POA: Diagnosis not present

## 2014-10-24 DIAGNOSIS — R079 Chest pain, unspecified: Secondary | ICD-10-CM | POA: Diagnosis not present

## 2014-10-24 DIAGNOSIS — G8929 Other chronic pain: Secondary | ICD-10-CM | POA: Diagnosis not present

## 2014-10-24 DIAGNOSIS — I10 Essential (primary) hypertension: Secondary | ICD-10-CM

## 2014-10-24 DIAGNOSIS — B1920 Unspecified viral hepatitis C without hepatic coma: Secondary | ICD-10-CM | POA: Diagnosis not present

## 2014-10-24 DIAGNOSIS — Z7951 Long term (current) use of inhaled steroids: Secondary | ICD-10-CM

## 2014-10-24 DIAGNOSIS — R072 Precordial pain: Secondary | ICD-10-CM | POA: Diagnosis not present

## 2014-10-24 DIAGNOSIS — J449 Chronic obstructive pulmonary disease, unspecified: Secondary | ICD-10-CM

## 2014-10-24 LAB — HEPATIC FUNCTION PANEL
ALBUMIN: 3.5 g/dL (ref 3.5–5.0)
ALK PHOS: 46 U/L (ref 38–126)
ALT: 12 U/L — ABNORMAL LOW (ref 17–63)
AST: 32 U/L (ref 15–41)
BILIRUBIN DIRECT: 0.1 mg/dL (ref 0.1–0.5)
BILIRUBIN INDIRECT: 0.7 mg/dL (ref 0.3–0.9)
BILIRUBIN TOTAL: 0.8 mg/dL (ref 0.3–1.2)
Total Protein: 6.4 g/dL — ABNORMAL LOW (ref 6.5–8.1)

## 2014-10-24 LAB — TROPONIN I
TROPONIN I: 0.04 ng/mL — AB (ref <0.031)
TROPONIN I: 0.04 ng/mL — AB (ref <0.031)
Troponin I: 0.04 ng/mL — ABNORMAL HIGH (ref <0.031)

## 2014-10-24 LAB — D-DIMER, QUANTITATIVE (NOT AT ARMC): D DIMER QUANT: 0.69 ug{FEU}/mL — AB (ref 0.00–0.48)

## 2014-10-24 MED ORDER — BUDESONIDE-FORMOTEROL FUMARATE 160-4.5 MCG/ACT IN AERO
2.0000 | INHALATION_SPRAY | Freq: Two times a day (BID) | RESPIRATORY_TRACT | Status: DC
Start: 2014-10-24 — End: 2014-10-25
  Administered 2014-10-24 – 2014-10-25 (×3): 2 via RESPIRATORY_TRACT
  Filled 2014-10-24: qty 6

## 2014-10-24 MED ORDER — ATORVASTATIN CALCIUM 80 MG PO TABS
80.0000 mg | ORAL_TABLET | Freq: Every day | ORAL | Status: DC
  Administered 2014-10-24: 80 mg via ORAL
  Filled 2014-10-24 (×2): qty 1

## 2014-10-24 MED ORDER — HYDROCHLOROTHIAZIDE 25 MG PO TABS
25.0000 mg | ORAL_TABLET | Freq: Every day | ORAL | Status: DC
  Administered 2014-10-24: 25 mg via ORAL
  Filled 2014-10-24 (×2): qty 1

## 2014-10-24 MED ORDER — ISOSORBIDE MONONITRATE ER 30 MG PO TB24
30.0000 mg | ORAL_TABLET | Freq: Every day | ORAL | Status: DC
  Administered 2014-10-24 – 2014-10-25 (×2): 30 mg via ORAL
  Filled 2014-10-24 (×2): qty 1

## 2014-10-24 MED ORDER — OXYCODONE HCL 5 MG PO TABS: 5.0000 mg | ORAL_TABLET | Freq: Two times a day (BID) | ORAL | Status: DC | PRN

## 2014-10-24 MED ORDER — ALBUTEROL SULFATE HFA 108 (90 BASE) MCG/ACT IN AERS: 1.0000 | INHALATION_SPRAY | Freq: Four times a day (QID) | RESPIRATORY_TRACT | Status: DC | PRN

## 2014-10-24 MED ORDER — ALBUTEROL SULFATE (2.5 MG/3ML) 0.083% IN NEBU
2.5000 mg | INHALATION_SOLUTION | Freq: Four times a day (QID) | RESPIRATORY_TRACT | Status: DC | PRN
Start: 2014-10-24 — End: 2014-10-25

## 2014-10-24 MED ORDER — GUAIFENESIN 400 MG PO TABS: 400.0000 mg | ORAL_TABLET | Freq: Two times a day (BID) | ORAL | Status: DC

## 2014-10-24 MED ORDER — HEPARIN SODIUM (PORCINE) 5000 UNIT/ML IJ SOLN
5000.0000 [IU] | Freq: Three times a day (TID) | INTRAMUSCULAR | Status: DC
  Administered 2014-10-24 – 2014-10-25 (×5): 5000 [IU] via SUBCUTANEOUS
  Filled 2014-10-24 (×6): qty 1

## 2014-10-24 MED ORDER — GUAIFENESIN 200 MG PO TABS
400.0000 mg | ORAL_TABLET | Freq: Two times a day (BID) | ORAL | Status: DC
  Administered 2014-10-24 – 2014-10-25 (×4): 400 mg via ORAL
  Filled 2014-10-24 (×5): qty 2

## 2014-10-24 MED ORDER — SENNOSIDES-DOCUSATE SODIUM 8.6-50 MG PO TABS
1.0000 | ORAL_TABLET | Freq: Every day | ORAL | Status: DC
  Administered 2014-10-24: 1 via ORAL
  Filled 2014-10-24 (×2): qty 1

## 2014-10-24 MED ORDER — IOHEXOL 350 MG/ML SOLN
80.0000 mL | Freq: Once | INTRAVENOUS | Status: AC | PRN
  Administered 2014-10-24: 80 mL via INTRAVENOUS

## 2014-10-24 MED ORDER — DULOXETINE HCL 60 MG PO CPEP
60.0000 mg | ORAL_CAPSULE | Freq: Every day | ORAL | Status: DC
  Administered 2014-10-24 – 2014-10-25 (×2): 60 mg via ORAL
  Filled 2014-10-24 (×2): qty 1

## 2014-10-24 MED ORDER — PANTOPRAZOLE SODIUM 40 MG PO TBEC
40.0000 mg | DELAYED_RELEASE_TABLET | Freq: Every day | ORAL | Status: DC
  Administered 2014-10-24 – 2014-10-25 (×2): 40 mg via ORAL
  Filled 2014-10-24 (×2): qty 1

## 2014-10-24 MED ORDER — IOHEXOL 300 MG/ML  SOLN: 80.0000 mL | Freq: Once | INTRAMUSCULAR | Status: DC | PRN

## 2014-10-24 MED ORDER — GUAIFENESIN 200 MG PO TABS: 400.0000 mg | ORAL_TABLET | Freq: Two times a day (BID) | ORAL | Status: DC

## 2014-10-24 MED ORDER — SODIUM CHLORIDE 0.9 % IV SOLN: 250.0000 mL | INTRAVENOUS | Status: DC | PRN

## 2014-10-24 MED ORDER — SODIUM CHLORIDE 0.9 % IJ SOLN
3.0000 mL | Freq: Two times a day (BID) | INTRAMUSCULAR | Status: DC
  Administered 2014-10-24 – 2014-10-25 (×3): 3 mL via INTRAVENOUS

## 2014-10-24 MED ORDER — CLOPIDOGREL BISULFATE 75 MG PO TABS
75.0000 mg | ORAL_TABLET | Freq: Every day | ORAL | Status: DC
  Administered 2014-10-24 – 2014-10-25 (×2): 75 mg via ORAL
  Filled 2014-10-24 (×2): qty 1

## 2014-10-24 MED ORDER — SODIUM CHLORIDE 0.9 % IJ SOLN: 3.0000 mL | INTRAMUSCULAR | Status: DC | PRN

## 2014-10-24 MED ORDER — CARVEDILOL 3.125 MG PO TABS
3.1250 mg | ORAL_TABLET | Freq: Two times a day (BID) | ORAL | Status: DC
  Administered 2014-10-24: 3.125 mg via ORAL
  Filled 2014-10-24 (×2): qty 1

## 2014-10-24 MED ORDER — ASPIRIN 81 MG PO CHEW
81.0000 mg | CHEWABLE_TABLET | Freq: Every day | ORAL | Status: DC
  Administered 2014-10-25: 81 mg via ORAL
  Filled 2014-10-24: qty 1

## 2014-10-24 MED ORDER — SODIUM CHLORIDE 0.9 % IJ SOLN
3.0000 mL | Freq: Two times a day (BID) | INTRAMUSCULAR | Status: DC
Start: 2014-10-24 — End: 2014-10-25
  Administered 2014-10-24: 3 mL via INTRAVENOUS
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
## 2014-10-24 NOTE — ED Notes (Signed)
Pt voided by urinal. Pt asked me for a sandwich. Pt informed he is NPO.

## 2014-10-24 NOTE — Progress Notes (Signed)
PT Cancellation Note  Patient Details Name: Todd [name] MRN: [id number] DOB: 1945/12/12   Cancelled Treatment:    Reason Eval/Treat Not Completed: Other (comment); patient on strict bedrest.  Will await increased activity order prior to mobilizing for PT.   [facility] 10/24/2014, 10:32 AM  Magda Kiel, [place] 10/24/2014

## 2014-10-24 NOTE — Evaluation (Signed)
Physical Therapy Evaluation Patient Details Name: Todd [name] MRN: [id number] DOB: 11/07/1945 Today's Date: 10/24/2014   History of Present Illness  Patient is a 69 y/o male admitted with chest pain.  PMH positive for TBI with chronic neurologic deficits and CAD.  Clinical Impression  Patient presents with limited mobility (unknown baseline,) due to deficits listed in PT problem list.  He will benefit from skilled PT in the acute setting to maximize independence and safety to allow d/c home with caregiver assist.  May need follow up HHPT depending on progress and baseline level of function.    Follow Up Recommendations Home health PT    Equipment Recommendations  None recommended by PT    Recommendations for Other Services       Precautions / Restrictions Precautions Precautions: Fall      Mobility  Bed Mobility Overal bed mobility: Needs Assistance Bed Mobility: Supine to Sit     Supine to sit: Supervision     General bed mobility comments: with rail and increased time, mod cues for initiation  Transfers Overall transfer level: Needs assistance Equipment used: Rolling walker (2 wheeled) Transfers: Sit to/from Stand Sit to Stand: Min assist         General transfer comment: slow and decreased anterior weight shift  Ambulation/Gait Ambulation/Gait assistance: Min assist Ambulation Distance (Feet): 30 Feet Assistive device: Rolling walker (2 wheeled) Gait Pattern/deviations: Step-through pattern;Decreased stride length;Shuffle;Ataxic   Gait velocity interpretation: <1.8 ft/sec, indicative of risk for recurrent falls General Gait Details: very slow, easily distracted, decreased initiation, decreased clearance left,   Stairs            Wheelchair Mobility    Modified Rankin (Stroke Patients Only)       Balance Overall balance assessment: Needs assistance Sitting-balance support: No upper extremity supported Sitting balance-Leahy Scale:  Fair Sitting balance - Comments: posterior bias, but balances without UE support   Standing balance support: Bilateral upper extremity supported Standing balance-Leahy Scale: Poor Standing balance comment: UE support needed for balance                             Pertinent Vitals/Pain Pain Assessment: No/denies pain    Home Living Family/patient expects to be discharged to:: Private residence Living Arrangements: Spouse/significant other Available Help at Discharge: Family Type of Home: Other(Comment) (Condo) Home Access: Stairs to enter Entrance Stairs-Rails: Psychiatric nurse of Steps: 9 then 7 Home Layout: One level Home Equipment: Walker - 2 wheels      Prior Function Level of Independence: Needs assistance   Gait / Transfers Assistance Needed: Pt reports ambulation with RW prior to admission.  ADL's / Homemaking Assistance Needed: Aide assists with all ADLs - dressing/bathing. Wife does cooking.  Comments: patient with dysarthria and difficulty with breath support, ataxic quality     Hand Dominance        Extremity/Trunk Assessment               Lower Extremity Assessment: LLE deficits/detail;RLE deficits/detail RLE Deficits / Details: increased tone in LE's l LLE Deficits / Details: noted ataxic movements during ambulation     Communication   Communication: Expressive difficulties  Cognition Arousal/Alertness: Awake/alert Behavior During Therapy: WFL for tasks assessed/performed Overall Cognitive Status: No family/caregiver present to determine baseline cognitive functioning (reports came in due to falling, (admitting dx chest pain)) Area of Impairment: Following commands;Problem solving       Following Commands: Follows one  step commands with increased time     Problem Solving: Slow processing;Decreased initiation;Requires verbal cues;Requires tactile cues;Difficulty sequencing      General Comments      Exercises         Assessment/Plan    PT Assessment Patient needs continued PT services  PT Diagnosis Abnormality of gait   PT Problem List Decreased activity tolerance;Decreased balance;Decreased mobility;Impaired tone  PT Treatment Interventions DME instruction;Balance training;Gait training;Stair training;Functional mobility training;Patient/family education;Therapeutic activities;Therapeutic exercise   PT Goals (Current goals can be found in the Care Plan section) Acute Rehab PT Goals Patient Stated Goal: None stated PT Goal Formulation: Patient unable to participate in goal setting Time For Goal Achievement: 10/31/14 Potential to Achieve Goals: Good    Frequency Min 3X/week   Barriers to discharge        Co-evaluation               End of Session Equipment Utilized During Treatment: Gait belt Activity Tolerance: Patient tolerated treatment well Patient left: in chair;with chair alarm set;with call bell/phone within reach Nurse Communication: Mobility status    Functional Assessment Tool Used: Clinical Judgement Functional Limitation: Mobility: Walking and moving around Mobility: Walking and Moving Around Current Status [phone]): At least 20 percent but less than 40 percent impaired, limited or restricted Mobility: Walking and Moving Around Goal Status [phone]): At least 1 percent but less than 20 percent impaired, limited or restricted    Time: 1555-1620 PT Time Calculation (min) (ACUTE ONLY): 25 min   Charges:   PT Evaluation $Initial PT Evaluation Tier I: 1 Procedure PT Treatments $Gait Training: 8-22 mins   PT G Codes:   PT G-Codes **NOT FOR INPATIENT CLASS** Functional Assessment Tool Used: Clinical Judgement Functional Limitation: Mobility: Walking and moving around Mobility: Walking and Moving Around Current Status (W9794): At least 20 percent but less than 40 percent impaired, limited or restricted Mobility: Walking and Moving Around Goal Status [phone]): At  least 1 percent but less than 20 percent impaired, limited or restricted    [facility] 10/24/2014, 5:00 PM [place], [place] 10/24/2014

## 2014-10-24 NOTE — ED Notes (Signed)
Pt wife is leaving, wife states patients diet is nectar thickened liquids. He also takes his pills whole in applesauce.

## 2014-10-24 NOTE — Progress Notes (Signed)
Subjective: Patient denies chest pain this morning. Troponins minimally elevated at 0.04 x 2. EKG with 1st degree AV block, but no acute changes. CTA chest negative for PE. We have asked cardiology for their input in regards to getting a Myoview.   Objective: Vital signs in last 24 hours: Filed Vitals:   10/24/14 0100 10/24/14 0147 10/24/14 0505 10/24/14 0951  BP:  128/81 124/72   Pulse:  76 76   Temp:  98.8 F (37.1 C) 98.3 F (36.8 C)   TempSrc:  Oral Oral   Resp:  20 18   Height: 6' (1.829 m)     Weight: 176 lb 3.2 oz (79.924 kg)     SpO2:  94% 100% 97%   Weight change:   Intake/Output Summary (Last 24 hours) at 10/24/14 1146 Last data filed at 10/24/14 1000  Gross per 24 hour  Intake      0 ml  Output   1001 ml  Net  -1001 ml   Physical Exam General: sitting up in bed, pleasant, NAD HEENT: Right ptosis present. Right pupil unreactive. Mucus membranes moist. CV: RRR, no m/g/r Pulm: rhonchi bilaterally, breaths non-labored Abd: BS+, soft, non-tender  Ext: warm, no edema Neuro: alert, 5/5 strength in upper and lower extremities bilaterally  Lab Results: Basic Metabolic Panel:  Recent Labs Lab 10/23/14 2215  NA 135  K 4.6  CL 100*  CO2 26  GLUCOSE 108*  BUN 24*  CREATININE 1.11  CALCIUM 8.9   Liver Function Tests:  Recent Labs Lab 10/24/14 0220  AST 32  ALT 12*  ALKPHOS 46  BILITOT 0.8  PROT 6.4*  ALBUMIN 3.5   CBC:  Recent Labs Lab 10/23/14 2215  WBC 8.8  NEUTROABS 5.9  HGB 14.8  HCT 42.6  MCV 94.5  PLT 304   Cardiac Enzymes:  Recent Labs Lab 10/24/14 0220 10/24/14 0636  TROPONINI 0.04* 0.04*   D-Dimer:  Recent Labs Lab 10/23/14 2315  DDIMER 0.69*     Studies/Results: Dg Chest 2 View  10/23/2014   CLINICAL DATA:  Chest pain today.  EXAM: CHEST  2 VIEW  COMPARISON:  04/05/2014  FINDINGS: Lung volumes are low on the AP view. There is mild bibasilar atelectasis, right greater than left. No confluent airspace disease to  suggest pneumonia. No pulmonary edema, pleural effusion, or pneumothorax. Old right-sided rib fractures are seen.  IMPRESSION: Bibasilar atelectasis, right greater than left.   Electronically Signed   By: Jeb Levering M.D.   On: 10/23/2014 22:37   Dg Lumbar Spine Complete  10/24/2014   CLINICAL DATA:  Low back pain post recent fall.  EXAM: LUMBAR SPINE - COMPLETE 4+ VIEW  COMPARISON:  CT lumbar spine 05/08/2014  FINDINGS: Unchanged compression deformities of T12, L1, and L3. No new compression deformity. 5 mm retrolisthesis of L3 on L4 is unchanged from prior exam. The bones are under mineralized. The posterior elements appear intact. No evidence of acute fracture. Atherosclerosis noted of the abdominal aorta.  IMPRESSION: 1. Stable compression deformities of T12, L1, and L3. Unchanged retrolisthesis of L3 on L4. 2. No definite acute bony abnormality.   Electronically Signed   By: Jeb Levering M.D.   On: 10/24/2014 01:18   Ct Angio Chest Pe W/cm &/or Wo Cm  10/24/2014   CLINICAL DATA:  Chest pain, onset at lunch time yesterday.  Dyspnea.  EXAM: CT ANGIOGRAPHY CHEST WITH CONTRAST  TECHNIQUE: Multidetector CT imaging of the chest was performed using the standard protocol during  bolus administration of intravenous contrast. Multiplanar CT image reconstructions and MIPs were obtained to evaluate the vascular anatomy.  CONTRAST:  5mL OMNIPAQUE IOHEXOL 350 MG/ML SOLN  COMPARISON:  05/15/2013  FINDINGS: Cardiovascular: There is good opacification of the pulmonary arteries. There is no pulmonary embolism. The thoracic aorta is normal in caliber and intact. There is extensive coronary artery calcification.  Lungs: Scattered ground-glass opacities in a mosaic distribution, suggesting air trapping. No masses or nodules. Subpleural reticular interstitial coarsening is present, likely fibrotic.  Central airways: Patent  Effusions: None  Lymphadenopathy: None  Esophagus: Mild esophageal mural thickening, diffuse.  This could represent esophagitis.  Upper abdomen: No significant abnormality  Musculoskeletal: No significant abnormality  Review of the MIP images confirms the above findings.  IMPRESSION: 1. Negative for pulmonary embolism 2. Mild chronic appearing interstitial coarsening. Mild mosaic ground-glass opacities suggesting air trapping. 3. Mild diffuse esophageal mural thickening, suggesting esophagitis.   Electronically Signed   By: Andreas Newport M.D.   On: 10/24/2014 04:38   Medications: I have reviewed the patient's current medications. Scheduled Meds: . [START ON 10/25/2014] aspirin  81 mg Oral Daily  . budesonide-formoterol  2 puff Inhalation BID  . DULoxetine  60 mg Oral Daily  . guaiFENesin  400 mg Oral BID  . heparin subcutaneous  5,000 Units Subcutaneous 3 times per day  . hydrochlorothiazide  25 mg Oral Daily  . pantoprazole  40 mg Oral Daily  . senna-docusate  1 tablet Oral QHS  . sodium chloride  3 mL Intravenous Q12H  . sodium chloride  3 mL Intravenous Q12H   Continuous Infusions:  PRN Meds:.sodium chloride, albuterol, oxyCODONE, sodium chloride Assessment/Plan:  Chest Pain: Patient presented with a 1 day hx of pressure-like chest pain associated with dyspnea and lightheadedness that occurred while he was eating lunch. Troponins minimally elevated at 0.04 x 2. EKG with sinus rhythm and 1st degree AV block, but no acute changes. Although he had a high Wells score of 4.5, his CTA chest was negative for PE. He does have a hx of CAD and had a positive stress test in 2011 for stress induced ischemia, EF 57%. He is not on aspirin at home, but takes plavix 75 mg daily. Possible that his chest pain is GI related since it started after eating, but it did persist. Given his history of CAD and positive stress test in the past, he may benefit from another stress test. Cardiology was consulted to help determine if stress Myoview indicated. Cards has ordered a 2D echo for now to assess for any  changes in his LV function.  - Cardiology consulted, appreciate recommendations - f/u last troponin - f/u 2D Echo - Continue ASA 81 mg daily - Continue Plavix 75 mg daily - Continue Coreg 3.125 mg BID  HTN: Normotensive. Patient is on HCTZ 25 mg daily at home. - Continue home HCTZ  Chronic Back pain with Recent Fall: Patient with recent fall on his left side. He has bruising on his left lateral abdomen from the fall. Lumbar x-ray without any acute changes. - Continue home Oxycodone IR 5 mg BID PRN  - PT consulted   COPD: Patient is on Symbicort and Albuterol inhalers at home. - Continue home meds   Depression: He takes Cymbalta 60 mg daily at home. - Continue home Cymbalta  GERD: Patient is on Prilosec 20 mg daily at home. Also has Prilosec 40 mg daily listed, unclear which dose he is taking.  - Continue Protonix 40 mg  daily     Diet: Heart healthy VTE PPx: Heparin SQ Dispo: Discharge possibly today. Awaiting if needs Myoview.   The patient does have a current PCP (Aldine Contes, MD) and does need an [facility] hospital follow-up appointment after discharge.  The patient does not have transportation limitations that hinder transportation to clinic appointments.  .Services Needed at time of discharge: Y = Yes, Blank = No PT:   OT:   RN:   Equipment:   Other:       Juliet Rude, MD 10/24/2014, 11:46 AM

## 2014-10-25 ENCOUNTER — Observation Stay (HOSPITAL_BASED_OUTPATIENT_CLINIC_OR_DEPARTMENT_OTHER): Payer: PPO

## 2014-10-25 DIAGNOSIS — R079 Chest pain, unspecified: Secondary | ICD-10-CM

## 2014-10-25 DIAGNOSIS — I10 Essential (primary) hypertension: Secondary | ICD-10-CM | POA: Diagnosis not present

## 2014-10-25 DIAGNOSIS — I2510 Atherosclerotic heart disease of native coronary artery without angina pectoris: Secondary | ICD-10-CM

## 2014-10-25 LAB — HEMOGLOBIN A1C
Hgb A1c MFr Bld: 5.4 % (ref 4.8–5.6)
Mean Plasma Glucose: 108 mg/dL

## 2014-10-25 MED ORDER — CARVEDILOL 3.125 MG PO TABS
3.1250 mg | ORAL_TABLET | Freq: Two times a day (BID) | ORAL | Status: DC
  Administered 2014-10-25: 3.125 mg via ORAL
  Filled 2014-10-25 (×3): qty 1

## 2014-10-25 MED ORDER — CARVEDILOL 3.125 MG PO TABS
3.1250 mg | ORAL_TABLET | Freq: Two times a day (BID) | ORAL | Status: DC
Start: 2014-10-25 — End: 2014-11-22

## 2014-10-25 MED ORDER — ASPIRIN 81 MG PO CHEW: 81.0000 mg | CHEWABLE_TABLET | Freq: Every day | ORAL | Status: DC

## 2014-10-25 MED ORDER — PERFLUTREN LIPID MICROSPHERE
1.0000 mL | INTRAVENOUS | Status: AC | PRN
  Administered 2014-10-25 (×2): 2 mL via INTRAVENOUS
  Filled 2014-10-25: qty 10

## 2014-10-25 MED ORDER — PANTOPRAZOLE SODIUM 40 MG PO TBEC: 40.0000 mg | DELAYED_RELEASE_TABLET | Freq: Every day | ORAL | Status: DC

## 2014-10-25 MED ORDER — ISOSORBIDE MONONITRATE ER 30 MG PO TB24: 30.0000 mg | ORAL_TABLET | Freq: Every day | ORAL | Status: AC

## 2014-10-25 MED ORDER — ATORVASTATIN CALCIUM 80 MG PO TABS: 80.0000 mg | ORAL_TABLET | Freq: Every day | ORAL | Status: AC

## 2014-10-25 NOTE — Progress Notes (Signed)
Discharge instructions reviewed with patient's wife, [name], to include medications and follow up appointments.  She voices understanding to teaching. Copies of D/C instructions given to her. Patient to door via wheel chair with his DME.  Home via [place] with his wife driving.
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
## 2014-10-25 NOTE — Discharge Instructions (Signed)
It was a pleasure taking care of you, Mr. Schroeter.  - Please review your medication list as there have been some changes. - Start taking Coreg, aspirin, Imdur, and Atorvastatin. These medications will help your heart. - If you experience chest pain again please come back to the emergency room - You have a follow up appointment in the IM clinic on July 8th  Take care, Dr. Arcelia Jew

## 2014-10-25 NOTE — Progress Notes (Signed)
  Echocardiogram 2D Echocardiogram with Definity has been performed.  Tresa Res 10/25/2014, 1:08 PM

## 2014-10-25 NOTE — Discharge Summary (Signed)
Name: Todd [name] MRN: [id number] DOB: 07-Jul-1945 69 y.o. PCP: Aldine Contes, MD  Date of Admission: 10/23/2014  9:51 PM Date of Discharge: 10/25/2014 Attending Physician: [name], MD  Discharge Diagnosis: Principal Problem Chest Pain in Setting of Known CAD Active Problems HTN Chronic Back Pain with Recent Fall COPD Depression GERD  Discharge Medications:   Medication List    STOP taking these medications        cephALEXin 500 MG capsule  Commonly known as:  KEFLEX     hydrochlorothiazide 25 MG tablet  Commonly known as:  HYDRODIURIL     omeprazole 20 MG capsule  Commonly known as:  PRILOSEC  Replaced by:  pantoprazole 40 MG tablet     omeprazole 40 MG capsule  Commonly known as:  PRILOSEC      TAKE these medications        albuterol 108 (90 BASE) MCG/ACT inhaler  Commonly known as:  PROAIR HFA  inhale 2 puffs INTO THE LUNGS every 6 hours if needed for wheezing     aspirin 81 MG chewable tablet  Chew 1 tablet (81 mg total) by mouth daily.     atorvastatin 80 MG tablet  Commonly known as:  LIPITOR  Take 1 tablet (80 mg total) by mouth daily at 6 PM.     budesonide-formoterol 160-4.5 MCG/ACT inhaler  Commonly known as:  SYMBICORT  Inhale 2 puffs into the lungs 2 (two) times daily.     carvedilol 3.125 MG tablet  Commonly known as:  COREG  Take 1 tablet (3.125 mg total) by mouth 2 (two) times daily with a meal.     clopidogrel 75 MG tablet  Commonly known as:  PLAVIX  Take 1 tablet (75 mg total) by mouth every morning.     desonide 0.05 % cream  Commonly known as:  DESOWEN  Apply 1 application topically 2 (two) times daily.     DULoxetine 60 MG capsule  Commonly known as:  CYMBALTA  take 1 capsule by mouth once daily     fluticasone 50 MCG/ACT nasal spray  Commonly known as:  FLONASE  Place 1 spray into both nostrils daily.     guaifenesin 400 MG Tabs tablet  Commonly known as:  HUMIBID E  Take 400 mg by mouth 2 (two)  times daily. Take every day per wife     isosorbide mononitrate 30 MG 24 hr tablet  Commonly known as:  IMDUR  Take 1 tablet (30 mg total) by mouth daily.     lidocaine 5 %  Commonly known as:  LIDODERM  Place 1 patch onto the skin every 12 (twelve) hours. Remove & Discard patch within 12 hours or as directed by MD     mirabegron ER 50 MG Tb24 tablet  Commonly known as:  MYRBETRIQ  Take 1 tablet (50 mg total) by mouth daily.     multivitamin with minerals Tabs tablet  Take 1 tablet by mouth every morning.     oxyCODONE 5 MG immediate release tablet  Commonly known as:  Oxy IR/ROXICODONE  Take 1 tablet (5 mg total) by mouth 2 (two) times daily as needed for severe pain.     pantoprazole 40 MG tablet  Commonly known as:  PROTONIX  Take 1 tablet (40 mg total) by mouth daily.        Disposition and follow-up:   Todd [name] was discharged from [facility] in Good condition.  At the hospital  follow up visit please address:  1.  Chest Pain- Has pt had any recurrent chest pain? Titrate up Coreg if BP can tolerate.  HTN- BP recheck. Determine if HCTZ should be restarted.   2.  Labs / imaging needed at time of follow-up: None  3.  Pending labs/ test needing follow-up: None   Follow-up Appointments:     Follow-up Information    Follow up with Burgess Estelle, MD.   Specialty:  Internal Medicine   Why:  Appointment on July 8th at 9:30 AM   Contact information:   [place] [id number] [phone]       Discharge Instructions: Discharge Instructions    Diet - low sodium heart healthy    Complete by:  As directed      Increase activity slowly    Complete by:  As directed            Consultations: Treatment Team:  Rounding Lbcardiology, MD  Procedures Performed:  Dg Chest 2 View  10/23/2014   CLINICAL DATA:  Chest pain today.  EXAM: CHEST  2 VIEW  COMPARISON:  04/05/2014  FINDINGS: Lung volumes are low on the AP view.  There is mild bibasilar atelectasis, right greater than left. No confluent airspace disease to suggest pneumonia. No pulmonary edema, pleural effusion, or pneumothorax. Old right-sided rib fractures are seen.  IMPRESSION: Bibasilar atelectasis, right greater than left.   Electronically Signed   By: Jeb Levering M.D.   On: 10/23/2014 22:37   Dg Lumbar Spine Complete  10/24/2014   CLINICAL DATA:  Low back pain post recent fall.  EXAM: LUMBAR SPINE - COMPLETE 4+ VIEW  COMPARISON:  CT lumbar spine 05/08/2014  FINDINGS: Unchanged compression deformities of T12, L1, and L3. No new compression deformity. 5 mm retrolisthesis of L3 on L4 is unchanged from prior exam. The bones are under mineralized. The posterior elements appear intact. No evidence of acute fracture. Atherosclerosis noted of the abdominal aorta.  IMPRESSION: 1. Stable compression deformities of T12, L1, and L3. Unchanged retrolisthesis of L3 on L4. 2. No definite acute bony abnormality.   Electronically Signed   By: Jeb Levering M.D.   On: 10/24/2014 01:18   Ct Angio Chest Pe W/cm &/or Wo Cm  10/24/2014   CLINICAL DATA:  Chest pain, onset at lunch time yesterday.  Dyspnea.  EXAM: CT ANGIOGRAPHY CHEST WITH CONTRAST  TECHNIQUE: Multidetector CT imaging of the chest was performed using the standard protocol during bolus administration of intravenous contrast. Multiplanar CT image reconstructions and MIPs were obtained to evaluate the vascular anatomy.  CONTRAST:  50mL OMNIPAQUE IOHEXOL 350 MG/ML SOLN  COMPARISON:  05/15/2013  FINDINGS: Cardiovascular: There is good opacification of the pulmonary arteries. There is no pulmonary embolism. The thoracic aorta is normal in caliber and intact. There is extensive coronary artery calcification.  Lungs: Scattered ground-glass opacities in a mosaic distribution, suggesting air trapping. No masses or nodules. Subpleural reticular interstitial coarsening is present, likely fibrotic.  Central airways: Patent   Effusions: None  Lymphadenopathy: None  Esophagus: Mild esophageal mural thickening, diffuse. This could represent esophagitis.  Upper abdomen: No significant abnormality  Musculoskeletal: No significant abnormality  Review of the MIP images confirms the above findings.  IMPRESSION: 1. Negative for pulmonary embolism 2. Mild chronic appearing interstitial coarsening. Mild mosaic ground-glass opacities suggesting air trapping. 3. Mild diffuse esophageal mural thickening, suggesting esophagitis.   Electronically Signed   By: Andreas Newport M.D.   On: 10/24/2014 04:38  2D Echo:  10/25/14 Impressions: - Even with Definity contrast, images are very poor, many of them uninterpretable. The left ventricle is probably normal in size and mildly hypertrophic. Regional wall motion cannot be assessed. Left ventricular systolic function is probably mildly depressed. Right ventricular function is depressed. There is no aortic stenosis. Unable to assess the other cardiac valves..  Admission HPI: 69 year old gentleman with a history of traumatic brain injury, stroke, chronic back pain, depression, COPD, hypertension and GERD who presents with chest pain. Chest pain started while he was eating lunch today. He states it is a pressure like sensation that is non radiating and rates pain as 6/10 in severity when it started. Chest pain is associated w/ SOB and lightheadedness. It is not associated with n/v, or diaphoresis. Pain is constant and is worsened with breathing and cough. Pain pills make chest pain better. Currently, chest pain is 4/10. Denies fevers, recent illness, recent long periods of travel, weight loss, and leg swelling. He has a sedentary lifestyle and he has a hx of skin cancer removal 15 yrs ago. Denies chest trauma although he did fall on his left side after tripping. He is on plavix daily which was held for 10 days prior to septoplasty done 2 weeks ago. He had a stress test done in  2011 that was positive for stress induced ischemia at the septal apex, EF was 57%. Medical management w/ plavix was started per family preference however wife states that is erroneous information and she would be open to the idea of a cardiac cath if needed. He has coronary artery calcifications seen on CT angio in 04/2013. In the ED, troponin was negative x 1, he was given asa 435 in the ED, and admitted for further chest pain evaluation.   Hospital Course by problem list:  Chest Pain in Setting of Known CAD: Patient presented with a 1 day hx of pressure-like chest pain associated with dyspnea and lightheadedness that occurred while he was eating lunch. Troponins minimally elevated at 0.04 x 3. EKG with sinus rhythm and 1st degree AV block, but no acute changes. Patient does have a history of known CAD and had a positive stress test in 2011 for stress induced ischemia, EF 57%. Cardiology was consulted and recommended continued medical management as the patient did not want to pursue any aggressive interventions such as cardiac cath/PCI. A 2D echo was obtained to assess for any new wall motion abnormalities and his LV function. Unfortunately the 2D echo was suboptimal with limited images due to poor windows but there appeared to be no changes in his LV function. Patient was discharged on continued medical management of Aspirin 81 mg daily, Atorvastatin 80 mg daily, Coreg 3.125 mg BID, Plavix 75 mg daily, and Imdur 30 mg daily. If the patient experiences chest pain again then a cardiac cath could be considered if the patient wishes to pursue this route of treatment. He has follow up in the IM clinic on 11/02/14.   HTN: Patient was normotensive on admission, but then BPs became softer in the 50P-54S systolic. His home HCTZ 25 mg daily was held, but he was continued on his home Coreg and Imdur. He was recommended to continue holding his HCTZ upon discharge. He will need a BP recheck at his outpatient follow up  visit.   Chronic Back Pain with Recent Fall: Patient had reported a recent fall at home on admission. He was noted to have bruising on his left lateral abdomen from the fall.  A lumbar x-ray was obtained which did not show any acute changes or fractures. He was continued on his home Oxycodone IR 5 mg BID PRN for pain control. PT was also consulted and recommended home health PT.   COPD: Patient was continued on his home Symbicort and Albuterol inhalers.   Depression: Patient was continued on his home Cymbalta 60 mg daily.   GERD: Patient had both Prilosec 20 mg daily and 40 mg daily listed on his home medication list. He was placed on Protonix 40 mg daily during his hospitalization and recommended to continue this upon discharge. He was instructed to stop Prilosec.   Discharge Vitals:   BP 96/67 mmHg  Pulse 79  Temp(Src) 97.2 F (36.2 C) (Oral)  Resp 18  Ht 6' (1.829 m)  Wt 168 lb 10.4 oz (76.5 kg)  BMI 22.87 kg/m2  SpO2 96% Physical Exam General: sitting up in bed eating breakfast, pleasant, NAD HEENT: Right ptosis present. Right pupil unreactive. Mucus membranes moist. CV: RRR, no m/g/r Pulm: rhonchi in upper airways, breaths non-labored Abd: BS+, soft, non-tender Ext: warm, no edema Neuro: alert   Discharge Labs:  No results found for this or any previous visit (from the past 24 hour(s)).  Signed: Juliet Rude, MD 10/25/2014, 2:11 PM    Services Ordered on Discharge: None Equipment Ordered on Discharge: None

## 2014-10-25 NOTE — Progress Notes (Signed)
Subjective: No acute events overnight. BP a little soft this morning. Patient denies any chest pain, dyspnea. He states he is feeling well. Does not want invasive measures for his CAD.   Objective: Vital signs in last 24 hours: Filed Vitals:   10/24/14 1500 10/24/14 2003 10/25/14 0553 10/25/14 0616  BP: 120/66 93/59 86/47  89/60  Pulse: 70 99 78   Temp: 98.4 F (36.9 C) 97.6 F (36.4 C) 98 F (36.7 C)   TempSrc: Oral Oral Oral   Resp: 18 17 18    Height:      Weight:   168 lb 10.4 oz (76.5 kg)   SpO2: 95% 93% 98%    Weight change: -7 lb 8.8 oz (-3.424 kg)  Intake/Output Summary (Last 24 hours) at 10/25/14 0811 Last data filed at 10/25/14 0600  Gross per 24 hour  Intake    582 ml  Output   2550 ml  Net  -1968 ml   Physical Exam General: sitting up in bed eating breakfast, pleasant, NAD HEENT: Right ptosis present. Right pupil unreactive. Mucus membranes moist. CV: RRR, no m/g/r Pulm: rhonchi in upper airways, breaths non-labored Abd: BS+, soft, non-tender Ext: warm, no edema Neuro: alert   Lab Results: Basic Metabolic Panel:  Recent Labs Lab 10/23/14 2215  NA 135  K 4.6  CL 100*  CO2 26  GLUCOSE 108*  BUN 24*  CREATININE 1.11  CALCIUM 8.9   Liver Function Tests:  Recent Labs Lab 10/24/14 0220  AST 32  ALT 12*  ALKPHOS 46  BILITOT 0.8  PROT 6.4*  ALBUMIN 3.5   CBC:  Recent Labs Lab 10/23/14 2215  WBC 8.8  NEUTROABS 5.9  HGB 14.8  HCT 42.6  MCV 94.5  PLT 304   Cardiac Enzymes:  Recent Labs Lab 10/24/14 0220 10/24/14 0636 10/24/14 1300  TROPONINI 0.04* 0.04* 0.04*   D-Dimer:  Recent Labs Lab 10/23/14 2315  DDIMER 0.69*   Hemoglobin A1C:  Recent Labs Lab 10/24/14 0220  HGBA1C 5.4     Studies/Results: Dg Chest 2 View  10/23/2014   CLINICAL DATA:  Chest pain today.  EXAM: CHEST  2 VIEW  COMPARISON:  04/05/2014  FINDINGS: Lung volumes are low on the AP view. There is mild bibasilar atelectasis, right greater than left. No  confluent airspace disease to suggest pneumonia. No pulmonary edema, pleural effusion, or pneumothorax. Old right-sided rib fractures are seen.  IMPRESSION: Bibasilar atelectasis, right greater than left.   Electronically Signed   By: Jeb Levering M.D.   On: 10/23/2014 22:37   Dg Lumbar Spine Complete  10/24/2014   CLINICAL DATA:  Low back pain post recent fall.  EXAM: LUMBAR SPINE - COMPLETE 4+ VIEW  COMPARISON:  CT lumbar spine 05/08/2014  FINDINGS: Unchanged compression deformities of T12, L1, and L3. No new compression deformity. 5 mm retrolisthesis of L3 on L4 is unchanged from prior exam. The bones are under mineralized. The posterior elements appear intact. No evidence of acute fracture. Atherosclerosis noted of the abdominal aorta.  IMPRESSION: 1. Stable compression deformities of T12, L1, and L3. Unchanged retrolisthesis of L3 on L4. 2. No definite acute bony abnormality.   Electronically Signed   By: Jeb Levering M.D.   On: 10/24/2014 01:18   Ct Angio Chest Pe W/cm &/or Wo Cm  10/24/2014   CLINICAL DATA:  Chest pain, onset at lunch time yesterday.  Dyspnea.  EXAM: CT ANGIOGRAPHY CHEST WITH CONTRAST  TECHNIQUE: Multidetector CT imaging of the chest was performed  using the standard protocol during bolus administration of intravenous contrast. Multiplanar CT image reconstructions and MIPs were obtained to evaluate the vascular anatomy.  CONTRAST:  65mL OMNIPAQUE IOHEXOL 350 MG/ML SOLN  COMPARISON:  05/15/2013  FINDINGS: Cardiovascular: There is good opacification of the pulmonary arteries. There is no pulmonary embolism. The thoracic aorta is normal in caliber and intact. There is extensive coronary artery calcification.  Lungs: Scattered ground-glass opacities in a mosaic distribution, suggesting air trapping. No masses or nodules. Subpleural reticular interstitial coarsening is present, likely fibrotic.  Central airways: Patent  Effusions: None  Lymphadenopathy: None  Esophagus: Mild  esophageal mural thickening, diffuse. This could represent esophagitis.  Upper abdomen: No significant abnormality  Musculoskeletal: No significant abnormality  Review of the MIP images confirms the above findings.  IMPRESSION: 1. Negative for pulmonary embolism 2. Mild chronic appearing interstitial coarsening. Mild mosaic ground-glass opacities suggesting air trapping. 3. Mild diffuse esophageal mural thickening, suggesting esophagitis.   Electronically Signed   By: Andreas Newport M.D.   On: 10/24/2014 04:38   Medications: I have reviewed the patient's current medications. Scheduled Meds: . aspirin  81 mg Oral Daily  . atorvastatin  80 mg Oral q1800  . budesonide-formoterol  2 puff Inhalation BID  . carvedilol  3.125 mg Oral BID WC  . clopidogrel  75 mg Oral Daily  . DULoxetine  60 mg Oral Daily  . guaiFENesin  400 mg Oral BID  . heparin subcutaneous  5,000 Units Subcutaneous 3 times per day  . isosorbide mononitrate  30 mg Oral Daily  . pantoprazole  40 mg Oral Daily  . senna-docusate  1 tablet Oral QHS  . sodium chloride  3 mL Intravenous Q12H  . sodium chloride  3 mL Intravenous Q12H   Continuous Infusions:  PRN Meds:.sodium chloride, albuterol, oxyCODONE, sodium chloride Assessment/Plan:  Chest Pain in Setting of Known CAD: Echo pending. Patient does not wish to have invasive measures (cath +/- PCI) for his CAD and he is not a good candidate for CABG. Non-invasive stress testing not helpful in his case as PCI not being pursued and only helpful for looking at his chronic stable defect. Will await echo results to reassess his LV function.  - Appreciate Dr. Allison Quarry consultation and teaching  - f/u 2D Echo - Continue ASA 81 mg daily - Continue Plavix 75 mg daily - Continue Coreg 3.125 mg BID - Continue Imdur 30 mg daily  - Continue Lipitor 80 mg daily   HTN: BP soft this morning at 89/60. Will hold home HCTZ 25 mg daily for now - Hold HCTZ - Continue Coreg and Imdur    Chronic Back pain with Recent Fall: Patient with recent fall on his left side. He has bruising on his left lateral abdomen from the fall. Lumbar x-ray without any acute changes. - Continue home Oxycodone IR 5 mg BID PRN  - PT consulted >> recommending home health PT  COPD: Patient is on Symbicort and Albuterol inhalers at home. - Continue home meds   Depression: He takes Cymbalta 60 mg daily at home. - Continue home Cymbalta  GERD: Patient is on Prilosec 20 mg daily at home. Also has Prilosec 40 mg daily listed, unclear which dose he is taking.  - Continue Protonix 40 mg daily   Diet: Heart healthy VTE PPx: Heparin SQ Dispo: Discharge likely today pending echo   The patient does have a current PCP (Aldine Contes, MD) and does need an [facility] hospital follow-up appointment after discharge.  The patient does not have transportation limitations that hinder transportation to clinic appointments.  .Services Needed at time of discharge: Y = Yes, Blank = No PT:   OT:   RN:   Equipment:   Other:       Juliet Rude, MD 10/25/2014, 8:11 AM

## 2014-10-25 NOTE — Consult Note (Signed)
   [facility] CM Inpatient Consult   10/25/2014  Todd [name] 05/14/1945 [id number]     Received referral from inpatient RNCM. Went to bedside. However, patient signaled for me to contact his wife. Called patient's wife, Todd [name] at [phone] to discuss re-engaging with [facility] again. Patient had been active with [facility] in 2014. Mrs. Nolon Rod is familiar with [facility]. She reports " I am pretty on top of everything and we do not need [facility] services at this time". She expressed appreciation of the call and offer. She did inquire about a day program for her husband to go to after he receives home health services. States patient will go home with home health but some time down the road she would like for him to go to a day program. Provided her with the [facility]'s telephone number to call for additional information. Made inpatient RNCM aware of the above.  Marthenia Rolling, MSN-Ed, RN,BSN [facility] Liaison [phone]

## 2014-10-25 NOTE — Care Management Note (Signed)
Case Management Note  Patient Details  Name: MILBURN FREENEY MRN: [id number] Date of Birth: 1945-06-01  Subjective/Objective:       Admitted with chest pain             Action/Plan: Patient lives at home with spouse; TCT [name] (spouse) patient is active with [facility] for HHPT/ OT, equipment needed lightweight w/c and tub bench. Ordered through [facility] to be delivered to room prior to discharging home. Patient has private insurance with [employer] and goes to the [facility] in [place] once a year for check ups. No problem getting medication. Pharmacy of choice is [facility] and per spouse the majority of his medication is free. Patient also goes to the [facility] for primary care service.  Expected Discharge Date:    possibly 10/25/2014              Expected Discharge Plan:  [place]  Discharge planning Services  CM Consult  DME Arranged:  Tub bench, Wheelchair manual DME Agency:  [facility] Arranged:  PT, OT [facility] Agency:  [facility]  Status of Service:  In process, will continue to follow  Sherrilyn Rist [phone] 10/25/2014, 11:37 AM
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
## 2014-10-25 NOTE — Progress Notes (Signed)
Pt Bp is a little soft this morning, MD on call made aware. Pt has no complaints at this time. We will continue to monitor.

## 2014-11-02 ENCOUNTER — Encounter: Payer: Self-pay | Admitting: Internal Medicine

## 2014-11-02 ENCOUNTER — Ambulatory Visit (INDEPENDENT_AMBULATORY_CARE_PROVIDER_SITE_OTHER): Payer: PPO | Admitting: Internal Medicine

## 2014-11-02 VITALS — BP 113/74 | HR 67 | Temp 97.8°F | Ht 72.0 in | Wt 174.9 lb

## 2014-11-02 DIAGNOSIS — J342 Deviated nasal septum: Secondary | ICD-10-CM

## 2014-11-02 DIAGNOSIS — G4700 Insomnia, unspecified: Secondary | ICD-10-CM

## 2014-11-02 DIAGNOSIS — E785 Hyperlipidemia, unspecified: Secondary | ICD-10-CM

## 2014-11-02 DIAGNOSIS — R21 Rash and other nonspecific skin eruption: Secondary | ICD-10-CM

## 2014-11-02 DIAGNOSIS — R197 Diarrhea, unspecified: Secondary | ICD-10-CM

## 2014-11-02 DIAGNOSIS — Z9889 Other specified postprocedural states: Secondary | ICD-10-CM | POA: Diagnosis not present

## 2014-11-02 DIAGNOSIS — R079 Chest pain, unspecified: Secondary | ICD-10-CM

## 2014-11-02 DIAGNOSIS — I209 Angina pectoris, unspecified: Secondary | ICD-10-CM

## 2014-11-02 MED ORDER — LORAZEPAM 0.5 MG PO TABS: ORAL_TABLET | ORAL | Status: AC

## 2014-11-02 MED ORDER — LORAZEPAM 0.5 MG PO TABS: 0.5000 mg | ORAL_TABLET | Freq: Two times a day (BID) | ORAL | Status: DC

## 2014-11-02 MED ORDER — OXYCODONE HCL 5 MG PO TABS: 5.0000 mg | ORAL_TABLET | Freq: Two times a day (BID) | ORAL | Status: DC | PRN
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
## 2014-11-02 NOTE — Assessment & Plan Note (Signed)
Hyperlipidemia: I reviewed the patient's lipid panel with him. It was done back in Nov 2015. His total chol was WNL and LDL was borderline high. Given that it was done less than a year ago, and now that he is on atorva 80 mg, I did not feel the need to do a repeat lipid panel currently. I told the patient that his LDL will be reduced now that he is on a statin  Medication and that we can repeat it in this November.

## 2014-11-02 NOTE — Patient Instructions (Signed)
Thank you for your visit today.  Please take lorazepam half tablet for 3-4 days as needed for sleep.  If the diarrhea recurs, please bring a sample for testing for infection  Please follow up with your PCP next month   We will refer you to dermatology for the rash on the forehead  Please continue your current medications, and do not take hydrochlorothiazide as your blood pressure is very well controlled.  General Instructions:   Please bring your medicines with you each time you come to clinic.  Medicines may include prescription medications, over-the-counter medications, herbal remedies, eye drops, vitamins, or other pills.

## 2014-11-02 NOTE — Assessment & Plan Note (Signed)
Refilled 2 months of oxycodone for his recent surgery pain.
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
## 2014-11-05 NOTE — Progress Notes (Signed)
Internal Medicine Clinic Attending  I saw and evaluated the patient.  I personally confirmed the key portions of the history and exam documented by Dr. Tiburcio Pea and I reviewed pertinent patient test results.  The assessment, diagnosis, and plan were formulated together and I agree with the documentation in the resident's note. The pt's wife stated he has not slept since D/C. Prior to the hospitalization, he had no sleeping issues and slept about 12 hrs at night along with a nap or two during day. He has always slept with the TV on. They already have a pre-bed routine. He has had severe adverse SE with Ambin x2 in past. They have tried melatonin, titrating up dose to therapeutic, without relief. Trazodone also tried in past without relief. Therefore, we will try few pills of ativan. Hopefully the insomnia is just interruption of his normal routine and getting a couple nights sleep with restore his sleep cycle.

## 2014-11-20 ENCOUNTER — Emergency Department (HOSPITAL_COMMUNITY): Payer: PPO

## 2014-11-20 ENCOUNTER — Observation Stay (HOSPITAL_COMMUNITY): Payer: PPO

## 2014-11-20 ENCOUNTER — Inpatient Hospital Stay (HOSPITAL_COMMUNITY)
Admission: EM | Admit: 2014-11-20 | Discharge: 2014-11-22 | DRG: 542 | Disposition: A | Discharge status: Skilled Nursing Facility | Payer: PPO | Attending: Internal Medicine | Admitting: Internal Medicine

## 2014-11-20 ENCOUNTER — Encounter (HOSPITAL_COMMUNITY): Payer: Self-pay | Admitting: Emergency Medicine

## 2014-11-20 DIAGNOSIS — R05 Cough: Secondary | ICD-10-CM

## 2014-11-20 DIAGNOSIS — R52 Pain, unspecified: Secondary | ICD-10-CM | POA: Diagnosis not present

## 2014-11-20 DIAGNOSIS — M8080XA Other osteoporosis with current pathological fracture, unspecified site, initial encounter for fracture: Secondary | ICD-10-CM | POA: Diagnosis present

## 2014-11-20 DIAGNOSIS — Z8711 Personal history of peptic ulcer disease: Secondary | ICD-10-CM

## 2014-11-20 DIAGNOSIS — S32028A Other fracture of second lumbar vertebra, initial encounter for closed fracture: Secondary | ICD-10-CM | POA: Diagnosis present

## 2014-11-20 DIAGNOSIS — J690 Pneumonitis due to inhalation of food and vomit: Secondary | ICD-10-CM | POA: Diagnosis not present

## 2014-11-20 DIAGNOSIS — R059 Cough, unspecified: Secondary | ICD-10-CM

## 2014-11-20 DIAGNOSIS — S32048A Other fracture of fourth lumbar vertebra, initial encounter for closed fracture: Secondary | ICD-10-CM | POA: Diagnosis present

## 2014-11-20 DIAGNOSIS — W19XXXA Unspecified fall, initial encounter: Secondary | ICD-10-CM | POA: Diagnosis present

## 2014-11-20 DIAGNOSIS — Z885 Allergy status to narcotic agent status: Secondary | ICD-10-CM

## 2014-11-20 DIAGNOSIS — B1920 Unspecified viral hepatitis C without hepatic coma: Secondary | ICD-10-CM | POA: Diagnosis present

## 2014-11-20 DIAGNOSIS — I2510 Atherosclerotic heart disease of native coronary artery without angina pectoris: Secondary | ICD-10-CM | POA: Diagnosis present

## 2014-11-20 DIAGNOSIS — M8088XA Other osteoporosis with current pathological fracture, vertebra(e), initial encounter for fracture: Principal | ICD-10-CM | POA: Diagnosis present

## 2014-11-20 DIAGNOSIS — M545 Low back pain, unspecified: Secondary | ICD-10-CM

## 2014-11-20 DIAGNOSIS — R000 Tachycardia, unspecified: Secondary | ICD-10-CM | POA: Diagnosis not present

## 2014-11-20 DIAGNOSIS — E785 Hyperlipidemia, unspecified: Secondary | ICD-10-CM | POA: Diagnosis present

## 2014-11-20 DIAGNOSIS — R269 Unspecified abnormalities of gait and mobility: Secondary | ICD-10-CM | POA: Diagnosis present

## 2014-11-20 DIAGNOSIS — S32020A Wedge compression fracture of second lumbar vertebra, initial encounter for closed fracture: Secondary | ICD-10-CM | POA: Diagnosis present

## 2014-11-20 DIAGNOSIS — S32000A Wedge compression fracture of unspecified lumbar vertebra, initial encounter for closed fracture: Secondary | ICD-10-CM

## 2014-11-20 DIAGNOSIS — G8929 Other chronic pain: Secondary | ICD-10-CM | POA: Diagnosis present

## 2014-11-20 DIAGNOSIS — Z7902 Long term (current) use of antithrombotics/antiplatelets: Secondary | ICD-10-CM

## 2014-11-20 DIAGNOSIS — Z79891 Long term (current) use of opiate analgesic: Secondary | ICD-10-CM

## 2014-11-20 DIAGNOSIS — S32040A Wedge compression fracture of fourth lumbar vertebra, initial encounter for closed fracture: Secondary | ICD-10-CM | POA: Diagnosis present

## 2014-11-20 DIAGNOSIS — Y92009 Unspecified place in unspecified non-institutional (private) residence as the place of occurrence of the external cause: Secondary | ICD-10-CM

## 2014-11-20 DIAGNOSIS — K219 Gastro-esophageal reflux disease without esophagitis: Secondary | ICD-10-CM | POA: Diagnosis present

## 2014-11-20 DIAGNOSIS — Z87820 Personal history of traumatic brain injury: Secondary | ICD-10-CM

## 2014-11-20 DIAGNOSIS — Z85828 Personal history of other malignant neoplasm of skin: Secondary | ICD-10-CM

## 2014-11-20 DIAGNOSIS — T17908D Unspecified foreign body in respiratory tract, part unspecified causing other injury, subsequent encounter: Secondary | ICD-10-CM

## 2014-11-20 DIAGNOSIS — R1310 Dysphagia, unspecified: Secondary | ICD-10-CM | POA: Diagnosis present

## 2014-11-20 DIAGNOSIS — I10 Essential (primary) hypertension: Secondary | ICD-10-CM | POA: Diagnosis present

## 2014-11-20 DIAGNOSIS — J449 Chronic obstructive pulmonary disease, unspecified: Secondary | ICD-10-CM | POA: Diagnosis present

## 2014-11-20 DIAGNOSIS — M549 Dorsalgia, unspecified: Secondary | ICD-10-CM

## 2014-11-20 DIAGNOSIS — Z79899 Other long term (current) drug therapy: Secondary | ICD-10-CM

## 2014-11-20 DIAGNOSIS — Z886 Allergy status to analgesic agent status: Secondary | ICD-10-CM

## 2014-11-20 DIAGNOSIS — F329 Major depressive disorder, single episode, unspecified: Secondary | ICD-10-CM | POA: Diagnosis present

## 2014-11-20 DIAGNOSIS — Z7982 Long term (current) use of aspirin: Secondary | ICD-10-CM

## 2014-11-20 DIAGNOSIS — R296 Repeated falls: Secondary | ICD-10-CM | POA: Diagnosis present

## 2014-11-20 DIAGNOSIS — J9601 Acute respiratory failure with hypoxia: Secondary | ICD-10-CM | POA: Diagnosis not present

## 2014-11-20 LAB — COMPREHENSIVE METABOLIC PANEL
ALK PHOS: 55 U/L (ref 38–126)
ALT: 20 U/L (ref 17–63)
ANION GAP: 11 (ref 5–15)
AST: 35 U/L (ref 15–41)
Albumin: 3.7 g/dL (ref 3.5–5.0)
BUN: 19 mg/dL (ref 6–20)
CO2: 22 mmol/L (ref 22–32)
Calcium: 9.2 mg/dL (ref 8.9–10.3)
Chloride: 107 mmol/L (ref 101–111)
Creatinine, Ser: 0.89 mg/dL (ref 0.61–1.24)
GFR calc non Af Amer: >60 mL/min (ref >60)
GLUCOSE: 116 mg/dL — AB (ref 65–99)
POTASSIUM: 4 mmol/L (ref 3.5–5.1)
SODIUM: 140 mmol/L (ref 135–145)
TOTAL PROTEIN: 6.1 g/dL — AB (ref 6.5–8.1)
Total Bilirubin: 2.3 mg/dL — ABNORMAL HIGH (ref 0.3–1.2)

## 2014-11-20 LAB — URINALYSIS, ROUTINE W REFLEX MICROSCOPIC
Glucose, UA: NEGATIVE mg/dL
HGB URINE DIPSTICK: NEGATIVE
KETONES UR: 15 mg/dL — AB
Leukocytes, UA: NEGATIVE
NITRITE: NEGATIVE
PROTEIN: NEGATIVE mg/dL
SPECIFIC GRAVITY, URINE: 1.023 (ref 1.005–1.030)
UROBILINOGEN UA: 1 mg/dL (ref 0.0–1.0)
pH: 7.5 (ref 5.0–8.0)

## 2014-11-20 LAB — CBC WITH DIFFERENTIAL/PLATELET
BASOS ABS: 0 10*3/uL (ref 0.0–0.1)
Basophils Relative: 0 % (ref 0–1)
EOS ABS: 0.1 10*3/uL (ref 0.0–0.7)
Eosinophils Relative: 1 % (ref 0–5)
HCT: 42.7 % (ref 39.0–52.0)
HEMOGLOBIN: 14.3 g/dL (ref 13.0–17.0)
Lymphocytes Relative: 12 % (ref 12–46)
Lymphs Abs: 1.2 10*3/uL (ref 0.7–4.0)
MCH: 32.8 pg (ref 26.0–34.0)
MCHC: 33.5 g/dL (ref 30.0–36.0)
MCV: 97.9 fL (ref 78.0–100.0)
Monocytes Absolute: 0.8 10*3/uL (ref 0.1–1.0)
Monocytes Relative: 8 % (ref 3–12)
NEUTROS ABS: 7.6 10*3/uL (ref 1.7–7.7)
Neutrophils Relative %: 79 % — ABNORMAL HIGH (ref 43–77)
Platelets: 176 10*3/uL (ref 150–400)
RBC: 4.36 MIL/uL (ref 4.22–5.81)
RDW: 13.7 % (ref 11.5–15.5)
WBC: 9.6 10*3/uL (ref 4.0–10.5)

## 2014-11-20 LAB — CK: Total CK: 68 U/L (ref 49–397)

## 2014-11-20 MED ORDER — SODIUM CHLORIDE 0.9 % IV SOLN: 1000.0000 mL | Freq: Once | INTRAVENOUS | Status: DC

## 2014-11-20 MED ORDER — DOCUSATE SODIUM 100 MG PO CAPS
100.0000 mg | ORAL_CAPSULE | Freq: Two times a day (BID) | ORAL | Status: DC
  Administered 2014-11-20 – 2014-11-22 (×4): 100 mg via ORAL
  Filled 2014-11-20 (×6): qty 1

## 2014-11-20 MED ORDER — OXYCODONE HCL 5 MG PO TABS
5.0000 mg | ORAL_TABLET | Freq: Once | ORAL | Status: DC
  Filled 2014-11-20: qty 1

## 2014-11-20 MED ORDER — SODIUM CHLORIDE 0.9 % IV SOLN: 1000.0000 mL | INTRAVENOUS | Status: DC

## 2014-11-20 MED ORDER — ALBUTEROL SULFATE (2.5 MG/3ML) 0.083% IN NEBU: 2.5000 mg | INHALATION_SOLUTION | RESPIRATORY_TRACT | Status: DC | PRN

## 2014-11-20 MED ORDER — DULOXETINE HCL 60 MG PO CPEP
60.0000 mg | ORAL_CAPSULE | Freq: Every day | ORAL | Status: DC
  Administered 2014-11-20 – 2014-11-22 (×2): 60 mg via ORAL
  Filled 2014-11-20 (×3): qty 1

## 2014-11-20 MED ORDER — POLYETHYLENE GLYCOL 3350 17 G PO PACK: 17.0000 g | PACK | Freq: Every day | ORAL | Status: DC | PRN

## 2014-11-20 MED ORDER — GUAIFENESIN 200 MG PO TABS
400.0000 mg | ORAL_TABLET | Freq: Two times a day (BID) | ORAL | Status: DC
  Administered 2014-11-20 – 2014-11-22 (×4): 400 mg via ORAL
  Filled 2014-11-20 (×6): qty 2

## 2014-11-20 MED ORDER — ASPIRIN 81 MG PO CHEW
81.0000 mg | CHEWABLE_TABLET | Freq: Every day | ORAL | Status: DC
  Administered 2014-11-20: 81 mg via ORAL
  Filled 2014-11-20: qty 1

## 2014-11-20 MED ORDER — HYDROMORPHONE HCL 2 MG PO TABS
2.0000 mg | ORAL_TABLET | Freq: Once | ORAL | Status: AC
  Administered 2014-11-20: 2 mg via ORAL
  Filled 2014-11-20: qty 1

## 2014-11-20 MED ORDER — MIRABEGRON ER 25 MG PO TB24
50.0000 mg | ORAL_TABLET | Freq: Every day | ORAL | Status: DC
  Administered 2014-11-20 – 2014-11-22 (×2): 50 mg via ORAL
  Filled 2014-11-20: qty 2
  Filled 2014-11-20 (×2): qty 1

## 2014-11-20 MED ORDER — HYDROMORPHONE HCL 1 MG/ML IJ SOLN
1.0000 mg | INTRAMUSCULAR | Status: DC | PRN
  Administered 2014-11-20 (×2): 1 mg via INTRAVENOUS
  Filled 2014-11-20 (×3): qty 1

## 2014-11-20 MED ORDER — ISOSORBIDE MONONITRATE ER 30 MG PO TB24
30.0000 mg | ORAL_TABLET | Freq: Every day | ORAL | Status: DC
  Administered 2014-11-20 – 2014-11-22 (×2): 30 mg via ORAL
  Filled 2014-11-20 (×3): qty 1

## 2014-11-20 MED ORDER — BUDESONIDE-FORMOTEROL FUMARATE 160-4.5 MCG/ACT IN AERO
2.0000 | INHALATION_SPRAY | Freq: Two times a day (BID) | RESPIRATORY_TRACT | Status: DC
  Administered 2014-11-20 – 2014-11-22 (×4): 2 via RESPIRATORY_TRACT
  Filled 2014-11-20 (×2): qty 6

## 2014-11-20 MED ORDER — ONDANSETRON HCL 4 MG PO TABS: 4.0000 mg | ORAL_TABLET | Freq: Four times a day (QID) | ORAL | Status: DC | PRN

## 2014-11-20 MED ORDER — ENOXAPARIN SODIUM 40 MG/0.4ML ~~LOC~~ SOLN
40.0000 mg | SUBCUTANEOUS | Status: DC
Start: 2014-11-20 — End: 2014-11-22
  Administered 2014-11-20 – 2014-11-22 (×3): 40 mg via SUBCUTANEOUS
  Filled 2014-11-20 (×3): qty 0.4

## 2014-11-20 MED ORDER — CLOPIDOGREL BISULFATE 75 MG PO TABS
75.0000 mg | ORAL_TABLET | Freq: Every morning | ORAL | Status: DC
Start: 2014-11-20 — End: 2014-11-21
  Administered 2014-11-20: 75 mg via ORAL
  Filled 2014-11-20 (×2): qty 1

## 2014-11-20 MED ORDER — SCOPOLAMINE 1 MG/3DAYS TD PT72
1.0000 | MEDICATED_PATCH | TRANSDERMAL | Status: DC
  Administered 2014-11-20: 1.5 mg via TRANSDERMAL
  Filled 2014-11-20: qty 1

## 2014-11-20 MED ORDER — FLUTICASONE PROPIONATE 50 MCG/ACT NA SUSP
1.0000 | Freq: Every day | NASAL | Status: DC
  Administered 2014-11-20 – 2014-11-22 (×3): 1 via NASAL
  Filled 2014-11-20: qty 16

## 2014-11-20 MED ORDER — HYDROMORPHONE HCL 1 MG/ML IJ SOLN
1.0000 mg | Freq: Once | INTRAMUSCULAR | Status: AC
  Administered 2014-11-20: 1 mg via INTRAVENOUS

## 2014-11-20 MED ORDER — ADULT MULTIVITAMIN W/MINERALS CH
1.0000 | ORAL_TABLET | Freq: Every morning | ORAL | Status: DC
  Administered 2014-11-20 – 2014-11-22 (×2): 1 via ORAL
  Filled 2014-11-20 (×3): qty 1

## 2014-11-20 MED ORDER — PANTOPRAZOLE SODIUM 40 MG PO TBEC
40.0000 mg | DELAYED_RELEASE_TABLET | Freq: Every day | ORAL | Status: DC
  Administered 2014-11-20 – 2014-11-22 (×2): 40 mg via ORAL
  Filled 2014-11-20 (×2): qty 1

## 2014-11-20 MED ORDER — LEVALBUTEROL HCL 1.25 MG/0.5ML IN NEBU
1.2500 mg | INHALATION_SOLUTION | RESPIRATORY_TRACT | Status: DC | PRN
  Filled 2014-11-20: qty 0.5

## 2014-11-20 MED ORDER — ATORVASTATIN CALCIUM 80 MG PO TABS
80.0000 mg | ORAL_TABLET | Freq: Every day | ORAL | Status: DC
  Administered 2014-11-20 – 2014-11-21 (×2): 80 mg via ORAL
  Filled 2014-11-20 (×3): qty 1

## 2014-11-20 MED ORDER — CALCITONIN (SALMON) 200 UNIT/ACT NA SOLN
1.0000 | Freq: Every day | NASAL | Status: DC
  Administered 2014-11-20 – 2014-11-22 (×3): 1 via NASAL
  Filled 2014-11-20: qty 3.7

## 2014-11-20 MED ORDER — ONDANSETRON HCL 4 MG/2ML IJ SOLN: 4.0000 mg | Freq: Four times a day (QID) | INTRAMUSCULAR | Status: DC | PRN
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
## 2014-11-20 NOTE — Progress Notes (Signed)
Pt arrived to 4N22. Wife at bedside. Pt and wife oriented to room. Bed alarm and bed in lowest position. Will continue to monitor.

## 2014-11-20 NOTE — Significant Event (Addendum)
Rapid Response Event Note Called by primary RN to see pt for lethargy, diaphoresis, decreased O2 sats Overview: Time Called: 2220 Arrival Time: 2224 Event Type: Respiratory  Initial Focused Assessment: On arrival to the room the pt will arouse to verbal stimulus but will not follow commands.  He has an upper airway "gurgle" that he cannot cough up on demand. HR 140, RR 30, T+ 101.4, 90% 3.5L.  NTS with minimal thick white sputum. Possible aspiration due to known swallow impairment.  Paged teaching service. No response. Upper level resident paged.  Interventions:  Lactic acid, per protocol   Event Summary:   at      at          [facility], Trevor Duty Hedgecock

## 2014-11-20 NOTE — Progress Notes (Signed)
Paged Rapid Response regarding pts Hr in 140s, diaphoresis, lung sounds and congestion.  Orders from Rapid received.

## 2014-11-20 NOTE — ED Notes (Signed)
Pt. arrived with EMS from home ,fell yesterday afternoon at home from standing position , no LOC , reports pain at lower back .

## 2014-11-20 NOTE — ED Notes (Signed)
Pt came in by EMS from home. Pt smelled of urine. Undressed Pt. Pajama bottom and T-shirts placed in belonging bag. Pt had 5 Diaper/Briefs on and were all 5 very wet. Pt cleaned and new brief was put on.

## 2014-11-20 NOTE — Progress Notes (Signed)
Notified by RN that Rapid Response had been called for lethargy, fever, tachypnea, and tachycardia.  At time of evaluation, patient sitting up in bed, alert, and in NAD.  Patient able to follow directions and verbalize that he was not in pain.  Patient had been tachycardic earlier in shift, presumed to be 2/2 pain a/w compression fractures of spine.  RN also concerned about secretions and upper airway sounds.  CXR had previously been ordered.  He was still in sinus tach during evaluation.  His fever was measured at 38.6C.  There was also concern for increasing oxygen demand, with patient satting 90% on 3.5 L [place].  Filed Vitals:   11/20/14 2218 11/20/14 2230 11/20/14 2245 11/20/14 2317  BP:  127/90  126/82  Pulse:  133  128  Temp:  99.3 F (37.4 C) 101.4 F (38.6 C)   TempSrc:  Oral Rectal   Resp:      SpO2: 90% 91%  90%   Gen: Sitting in bed, alert, NAD CV: Tachycardic. Regular rhythm.  Pulm: Coarse rhonchi throughout, radiating from upper airway.  Equal breath sounds bilaterally Ext: No LE edema  A/P:  [name] first febrile episode.    Patient borderline hypoxic on , but patient is breathing his mouth and not through his nose.  Will transfer to stepdown.  Will obtain BCx and await CXR read.  Lactate pending from rapid response nurse. - Vanc/Zosyn - BCx - UA - F/u CXR - Transfer to stepdown

## 2014-11-20 NOTE — ED Provider Notes (Signed)
Patient care was assumed from Dr. Roxanne Mins for follow-up on diagnostic studies. Patient does have new lumbar compression fractures. I have interviewed the patient and his wife. She reports that intractable pain has been a significant issue in his lower back. Usually they have adequate pain control at home but have not been able to alleviate his discomfort. The patient has significant multiple medical comorbidities and has recently suffered from frequent falling. Internal medicine teaching service was consults it, at this time the patient will be admitted for intractable pain and gait instability.  Charlesetta Shanks, MD 11/20/14 1037

## 2014-11-21 DIAGNOSIS — K219 Gastro-esophageal reflux disease without esophagitis: Secondary | ICD-10-CM | POA: Diagnosis present

## 2014-11-21 DIAGNOSIS — Z87820 Personal history of traumatic brain injury: Secondary | ICD-10-CM | POA: Diagnosis not present

## 2014-11-21 DIAGNOSIS — J9600 Acute respiratory failure, unspecified whether with hypoxia or hypercapnia: Secondary | ICD-10-CM

## 2014-11-21 DIAGNOSIS — M8088XA Other osteoporosis with current pathological fracture, vertebra(e), initial encounter for fracture: Secondary | ICD-10-CM | POA: Diagnosis present

## 2014-11-21 DIAGNOSIS — M4856XD Collapsed vertebra, not elsewhere classified, lumbar region, subsequent encounter for fracture with routine healing: Secondary | ICD-10-CM | POA: Diagnosis not present

## 2014-11-21 DIAGNOSIS — Z7902 Long term (current) use of antithrombotics/antiplatelets: Secondary | ICD-10-CM | POA: Diagnosis not present

## 2014-11-21 DIAGNOSIS — S32028A Other fracture of second lumbar vertebra, initial encounter for closed fracture: Secondary | ICD-10-CM | POA: Diagnosis present

## 2014-11-21 DIAGNOSIS — F329 Major depressive disorder, single episode, unspecified: Secondary | ICD-10-CM

## 2014-11-21 DIAGNOSIS — Z886 Allergy status to analgesic agent status: Secondary | ICD-10-CM | POA: Diagnosis not present

## 2014-11-21 DIAGNOSIS — R296 Repeated falls: Secondary | ICD-10-CM

## 2014-11-21 DIAGNOSIS — M810 Age-related osteoporosis without current pathological fracture: Secondary | ICD-10-CM

## 2014-11-21 DIAGNOSIS — E785 Hyperlipidemia, unspecified: Secondary | ICD-10-CM | POA: Diagnosis present

## 2014-11-21 DIAGNOSIS — J9601 Acute respiratory failure with hypoxia: Secondary | ICD-10-CM | POA: Diagnosis not present

## 2014-11-21 DIAGNOSIS — I10 Essential (primary) hypertension: Secondary | ICD-10-CM | POA: Diagnosis present

## 2014-11-21 DIAGNOSIS — Z79899 Other long term (current) drug therapy: Secondary | ICD-10-CM

## 2014-11-21 DIAGNOSIS — M4856XA Collapsed vertebra, not elsewhere classified, lumbar region, initial encounter for fracture: Secondary | ICD-10-CM

## 2014-11-21 DIAGNOSIS — I2510 Atherosclerotic heart disease of native coronary artery without angina pectoris: Secondary | ICD-10-CM

## 2014-11-21 DIAGNOSIS — R52 Pain, unspecified: Secondary | ICD-10-CM | POA: Diagnosis not present

## 2014-11-21 DIAGNOSIS — Z7982 Long term (current) use of aspirin: Secondary | ICD-10-CM | POA: Diagnosis not present

## 2014-11-21 DIAGNOSIS — Z79891 Long term (current) use of opiate analgesic: Secondary | ICD-10-CM | POA: Diagnosis not present

## 2014-11-21 DIAGNOSIS — Z8673 Personal history of transient ischemic attack (TIA), and cerebral infarction without residual deficits: Secondary | ICD-10-CM

## 2014-11-21 DIAGNOSIS — R269 Unspecified abnormalities of gait and mobility: Secondary | ICD-10-CM | POA: Diagnosis present

## 2014-11-21 DIAGNOSIS — Z885 Allergy status to narcotic agent status: Secondary | ICD-10-CM | POA: Diagnosis not present

## 2014-11-21 DIAGNOSIS — M4855XD Collapsed vertebra, not elsewhere classified, thoracolumbar region, subsequent encounter for fracture with routine healing: Secondary | ICD-10-CM

## 2014-11-21 DIAGNOSIS — S32048A Other fracture of fourth lumbar vertebra, initial encounter for closed fracture: Secondary | ICD-10-CM | POA: Diagnosis present

## 2014-11-21 DIAGNOSIS — R1310 Dysphagia, unspecified: Secondary | ICD-10-CM | POA: Diagnosis present

## 2014-11-21 DIAGNOSIS — J449 Chronic obstructive pulmonary disease, unspecified: Secondary | ICD-10-CM

## 2014-11-21 DIAGNOSIS — Z9181 History of falling: Secondary | ICD-10-CM

## 2014-11-21 DIAGNOSIS — G8929 Other chronic pain: Secondary | ICD-10-CM | POA: Diagnosis present

## 2014-11-21 DIAGNOSIS — B1920 Unspecified viral hepatitis C without hepatic coma: Secondary | ICD-10-CM | POA: Diagnosis present

## 2014-11-21 DIAGNOSIS — J690 Pneumonitis due to inhalation of food and vomit: Secondary | ICD-10-CM | POA: Diagnosis not present

## 2014-11-21 DIAGNOSIS — Z7951 Long term (current) use of inhaled steroids: Secondary | ICD-10-CM

## 2014-11-21 DIAGNOSIS — Z8711 Personal history of peptic ulcer disease: Secondary | ICD-10-CM | POA: Diagnosis not present

## 2014-11-21 DIAGNOSIS — R000 Tachycardia, unspecified: Secondary | ICD-10-CM | POA: Diagnosis not present

## 2014-11-21 DIAGNOSIS — M545 Low back pain: Secondary | ICD-10-CM | POA: Diagnosis present

## 2014-11-21 DIAGNOSIS — Z85828 Personal history of other malignant neoplasm of skin: Secondary | ICD-10-CM | POA: Diagnosis not present

## 2014-11-21 DIAGNOSIS — W19XXXA Unspecified fall, initial encounter: Secondary | ICD-10-CM | POA: Diagnosis present

## 2014-11-21 LAB — URINALYSIS, ROUTINE W REFLEX MICROSCOPIC
Glucose, UA: NEGATIVE mg/dL
HGB URINE DIPSTICK: NEGATIVE
KETONES UR: 15 mg/dL — AB
Leukocytes, UA: NEGATIVE
Nitrite: NEGATIVE
PH: 6 (ref 5.0–8.0)
Protein, ur: NEGATIVE mg/dL
SPECIFIC GRAVITY, URINE: 1.03 (ref 1.005–1.030)
UROBILINOGEN UA: 1 mg/dL (ref 0.0–1.0)

## 2014-11-21 LAB — GLUCOSE, CAPILLARY: GLUCOSE-CAPILLARY: 153 mg/dL — AB (ref 65–99)

## 2014-11-21 LAB — MRSA PCR SCREENING: MRSA by PCR: NEGATIVE

## 2014-11-21 LAB — LACTIC ACID, PLASMA: Lactic Acid, Venous: 1.1 mmol/L (ref 0.5–2.0)

## 2014-11-21 MED ORDER — VANCOMYCIN HCL IN DEXTROSE 1-5 GM/200ML-% IV SOLN
1000.0000 mg | Freq: Three times a day (TID) | INTRAVENOUS | Status: DC
  Administered 2014-11-21 (×2): 1000 mg via INTRAVENOUS
  Filled 2014-11-21 (×4): qty 200

## 2014-11-21 MED ORDER — PIPERACILLIN-TAZOBACTAM 3.375 G IVPB
3.3750 g | Freq: Three times a day (TID) | INTRAVENOUS | Status: DC
  Administered 2014-11-21 (×2): 3.375 g via INTRAVENOUS
  Filled 2014-11-21 (×6): qty 50

## 2014-11-21 MED ORDER — ASPIRIN 300 MG RE SUPP
150.0000 mg | Freq: Once | RECTAL | Status: AC
  Administered 2014-11-21: 150 mg via RECTAL
  Filled 2014-11-21: qty 1

## 2014-11-21 MED ORDER — OXYCODONE HCL 5 MG PO TABS
5.0000 mg | ORAL_TABLET | ORAL | Status: DC | PRN
  Administered 2014-11-21 – 2014-11-22 (×2): 5 mg via ORAL
  Filled 2014-11-21 (×2): qty 1

## 2014-11-21 MED ORDER — OXYCODONE-ACETAMINOPHEN 5-325 MG PO TABS: 1.0000 | ORAL_TABLET | Freq: Four times a day (QID) | ORAL | Status: DC | PRN

## 2014-11-21 MED ORDER — VANCOMYCIN HCL 10 G IV SOLR
1500.0000 mg | Freq: Once | INTRAVENOUS | Status: AC
  Administered 2014-11-21: 1500 mg via INTRAVENOUS
  Filled 2014-11-21: qty 1500

## 2014-11-21 MED ORDER — ACETAMINOPHEN 500 MG PO TABS: 500.0000 mg | ORAL_TABLET | Freq: Four times a day (QID) | ORAL | Status: DC | PRN

## 2014-11-21 MED ORDER — POLYVINYL ALCOHOL 1.4 % OP SOLN
1.0000 [drp] | OPHTHALMIC | Status: DC | PRN
  Filled 2014-11-21: qty 15

## 2014-11-21 MED ORDER — ASPIRIN 325 MG PO TABS
325.0000 mg | ORAL_TABLET | Freq: Every day | ORAL | Status: DC
  Administered 2014-11-22: 325 mg via ORAL
  Filled 2014-11-21: qty 1

## 2014-11-21 MED ORDER — CETYLPYRIDINIUM CHLORIDE 0.05 % MT LIQD
7.0000 mL | Freq: Two times a day (BID) | OROMUCOSAL | Status: DC
  Administered 2014-11-21 – 2014-11-22 (×3): 7 mL via OROMUCOSAL

## 2014-11-21 MED ORDER — ACETAMINOPHEN 650 MG RE SUPP
325.0000 mg | RECTAL | Status: DC | PRN
  Administered 2014-11-21: 325 mg via RECTAL

## 2014-11-21 MED ORDER — ACETAMINOPHEN 325 MG RE SUPP
RECTAL | Status: AC
  Filled 2014-11-21: qty 1

## 2014-11-21 MED ORDER — HYDROMORPHONE HCL 1 MG/ML IJ SOLN: 0.5000 mg | INTRAMUSCULAR | Status: DC | PRN

## 2014-11-21 NOTE — Evaluation (Signed)
Occupational Therapy Evaluation Patient Details Name: Todd [name] MRN: [id number] DOB: [birth year] Today's Date: 11/21/2014    History of Present Illness Pt admitted after multiple falls resulting in L2 compression fx. Pt with complicated medical hx including: TBI, CVA, chronic back pain, depression, COPD, HTN, GERD, CADm HTN, Hep c and seizures. Hospital course complicated by increased HR, diaphoresis, congested lung sounds with transfer from 4N to SDU.   Clinical Impression   Pt was assisted for ambulation with RW and for all self care except feeding.  Pt currently requires +2 assist for all mobility.  Activity limited today to EOB with good tolerance on 10L 02 via VM.  Recommending SNF for ST rehab.  Will defer further OT to SNF.    Follow Up Recommendations  SNF;Supervision/Assistance - 24 hour    Equipment Recommendations       Recommendations for Other Services       Precautions / Restrictions Precautions Precautions: Fall;Back Precaution Comments: incontinent, keep back precautions for safety and pain control due to lumbar fxs Restrictions Weight Bearing Restrictions: No Other Position/Activity Restrictions: in mitts with 4 rails up      Mobility Bed Mobility Overal bed mobility: +2 for physical assistance;Needs Assistance Bed Mobility: Rolling;Sidelying to Sit;Sit to Supine Rolling: +2 for physical assistance;Mod assist Sidelying to sit: +2 for physical assistance;Mod assist   Sit to supine: +2 for physical assistance;Max assist   General bed mobility comments: educated in log roll technique for back precautions. Mod A +2 to elevate trunk from bed. With return to supine, pt having back spasms and tends to grab and hold things tightly with left hand, assistance to release rail multiple times.    Transfers                      Balance Overall balance assessment: Needs assistance Sitting-balance support: Single extremity supported;Feet  supported Sitting balance-Leahy Scale: Zero Sitting balance - Comments: sat EOB x 12 minutes with strong posterior lean. worked on shifting wt fwd but this was difficult for pt and required max A for most of the time to prevent posterior fall.  Postural control: Posterior lean                                  ADL Overall ADL's : At baseline                                             Vision Additional Comments: does not appear to have vision in R eye   Perception     Praxis      Pertinent Vitals/Pain Pain Assessment: 0-10 Pain Score: 5  Faces Pain Scale: Hurts whole lot Pain Location: back Pain Descriptors / Indicators: Spasm;Grimacing Pain Intervention(s): Limited activity within patient's tolerance;Monitored during session;Repositioned     Hand Dominance Right   Extremity/Trunk Assessment Upper Extremity Assessment Upper Extremity Assessment: Defer to OT evaluation RUE Deficits / Details: ataxic quality to movement RUE Coordination: decreased fine motor;decreased gross motor LUE Deficits / Details: ataxic quality to movement LUE Coordination: decreased fine motor;decreased gross motor   Lower Extremity Assessment Lower Extremity Assessment: RLE deficits/detail;LLE deficits/detail RLE Deficits / Details: weaker than left, appears to have foot drop, hip flex 3/5, knee ext 2-/5, ankle df 0/5 RLE Coordination: decreased fine motor;decreased gross  motor LLE Deficits / Details: increased tone LLE though able to be broken with active motion. hip flex 4/5, knee ext 4/5   Cervical / Trunk Assessment Cervical / Trunk Assessment: Kyphotic   Communication Communication Communication: Expressive difficulties   Cognition Arousal/Alertness: Awake/alert Behavior During Therapy: Impulsive Overall Cognitive Status: No family/caregiver present to determine baseline cognitive functioning                     General Comments        Exercises       Shoulder Instructions      Home Living Family/patient expects to be discharged to:: Skilled nursing facility Living Arrangements: Spouse/significant other                               Additional Comments: per RN, wife does not feel she can care for pt at home anymore      Prior Functioning/Environment Level of Independence: Needs assistance  Gait / Transfers Assistance Needed: Pt reports ambulation with RW and assist prior to admission, with multiple falls. Reports that he is weak on right side and often trips with that foot ADL's / Homemaking Assistance Needed: Assisted with self care at baseline, self feeds. Per chart, had aide that assisted. Communication / Swallowing Assistance Needed: limited verbal communication--hard to understand due to dysarthria and low toned voice as well as face mask Comments: patient with dysarthria and difficulty with breath support, ataxic quality    OT Diagnosis: Generalized weakness;Cognitive deficits;Acute pain;Ataxia   OT Problem List: Decreased strength;Decreased activity tolerance;Impaired balance (sitting and/or standing);Decreased coordination;Decreased cognition;Decreased knowledge of precautions;Cardiopulmonary status limiting activity;Impaired tone;Pain;Impaired UE functional use   OT Treatment/Interventions:      OT Goals(Current goals can be found in the care plan section) Acute Rehab OT Goals Patient Stated Goal: none stated  OT Frequency:     Barriers to D/C:            Co-evaluation PT/OT/SLP Co-Evaluation/Treatment: Yes Reason for Co-Treatment: Complexity of the patient's impairments (multi-system involvement);Necessary to address cognition/behavior during functional activity;For patient/therapist safety PT goals addressed during session: Mobility/safety with mobility;Balance;Strengthening/ROM OT goals addressed during session: Strengthening/ROM      End of Session Equipment Utilized During  Treatment: Oxygen (10L ) Nurse Communication: Mobility status  Activity Tolerance: Patient limited by pain Patient left: in bed;with call bell/phone within reach;with bed alarm set   Time: 8657-8469 OT Time Calculation (min): 33 min Charges:  OT General Charges $OT Visit: 1 Procedure OT Evaluation $Initial OT Evaluation Tier I: 1 Procedure G-Codes:    Malka So 11/21/2014, 12:33 PM  [phone]
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
## 2014-11-21 NOTE — Progress Notes (Signed)
[facility]  Patient Status: Active (receiving services up to time of hospitalization)  [facility] is providing the following services: PT and OT  If patient discharges after hours, please call [phone].   Todd [name] 11/21/2014, 10:32 AM

## 2014-11-21 NOTE — Progress Notes (Signed)
Report called and then pt transferred to [facility] without incident.  Pt alert and following commands. Vitals obtained before transfer, see flowsheet.

## 2014-11-21 NOTE — Progress Notes (Signed)
  Date: 11/21/2014  Patient name: Todd [name]  Medical record number: [id number]  Date of birth: October 20, 1945   I have seen and evaluated Todd [name] and discussed their care with the Residency Team. Todd [name] is well known to me and I just saw him earlier in month in [facility]. He was TMI, CVA, and h/o lumbar compression fractures. The hx is obtained from Dr Jodene Nam H&P as the pt is unable to provide a hx and the wife is not at bedside. He had been on HCTZ for BP but was admitted in June 2016 for CP and changed to Coreg and Imdur. Since then, the wife has noticed gait instability and increasing falls. He has fallen 5-6 times over past few weeks, most recently on day prior to admit. His back pain increased and his home oxycodone did not provide relief and he was having trouble standing and he was brought to the ED.  Overnight, rapid response was called lethargy, diaphoresis, congestion, and hypoxia - 90% on 3.5L. HR was 140. T 101.4 (not repeated). Team team started Vanc and Zosyn.    Filed Vitals:   11/21/14 1300  BP: 123/82  Pulse:   Temp:   Resp: 22  No further temp HR 86 93% on 2 L  Gen He is alert and answers questions appropriately. Not too far from baseline. He is not quite as interactive today but states we woke him up HRRR L clear inspiration, exp rhonchi that can be heard without stethoscope ABD + BS, soft Ext no edema  WBC 9.8 Lactic acid 1.1  Assessment and Plan: I have seen and evaluated the patient as outlined above. I agree with the formulated Assessment and Plan as detailed in the residents' admission note, with the following changes:   1. Acute compression fx - new L2 and L4. Stable fx of T12, L1 and L3. He is currently not on bisphosphonate, Ca, or VIt D (was 33 12/13). He has been started on calcitonin to help with pain control and is also on opioids. Would rec checking Vit D if we are getting more lab draws, otherwise can be checked as outpt. Will need  bisphosphonates and DEXA as outpt. It is unusual that a 54 male who is not cachetic and is weight bearing would have this degree of osteoporosis. It is possible that he has hypogonadism but would not pursue W/U at this time as there is concern that it might increase CV risk and he already has CAD.   2. Acute resp failure - I am not convinced that he has a bacterial infxn - Dr [name] will reassess this afternoon and if still stable, D/C ABX. If there is doubt, a procalcitonin might be checked. All sxs could have been 2/2 mucus plugging and chemical pneumonitis. Cont suctioning and O2 as needed and follow clinical response.  3. HTN - he has twice been on BB with adverse rxn. He is not having CP either so would favor stopping both and following BP and sxs. Understand that he has CAD and requested med mgmt but at this point risks > benefits of BB.  Likely D/C to SNF once resp status stable and on PO pain regimen.  Bartholomew Crews, MD 7/27/20162:55 PM

## 2014-11-22 DIAGNOSIS — W19XXXA Unspecified fall, initial encounter: Secondary | ICD-10-CM | POA: Insufficient documentation

## 2014-11-22 DIAGNOSIS — Y92009 Unspecified place in unspecified non-institutional (private) residence as the place of occurrence of the external cause: Secondary | ICD-10-CM

## 2014-11-22 DIAGNOSIS — M4856XD Collapsed vertebra, not elsewhere classified, lumbar region, subsequent encounter for fracture with routine healing: Secondary | ICD-10-CM

## 2014-11-22 MED ORDER — OXYCODONE HCL 5 MG PO TABS: 10.0000 mg | ORAL_TABLET | ORAL | Status: DC | PRN

## 2014-11-22 MED ORDER — OXYCODONE HCL 10 MG PO TABS: 10.0000 mg | ORAL_TABLET | ORAL | Status: DC | PRN

## 2014-11-22 MED ORDER — OXYCODONE HCL 5 MG PO TABS
5.0000 mg | ORAL_TABLET | Freq: Once | ORAL | Status: AC
  Administered 2014-11-22: 5 mg via ORAL
  Filled 2014-11-22: qty 1

## 2014-11-22 MED ORDER — RESOURCE THICKENUP CLEAR PO POWD
ORAL | Status: DC | PRN
Start: 2014-11-22 — End: 2014-11-22
  Filled 2014-11-22: qty 125

## 2014-11-22 NOTE — Discharge Instructions (Addendum)
Your home medication of Coreg (carvedilol) is discontinued at this time as it can contributed to dizziness when standing and walking. This is a heart protective medicine but currently the risks outweigh benefits. Your other home medications are continued.  Work with physical therapy at your nursing facility to improve strength and coordination. Initially walk only with assistance and improve as tolerated.  Back pain will improve slowly after your fracture. Your pain medication was increased to 10mg  per 4 hours as needed. Please take this medicine as needed and discuss with your doctor how much is being required at your next office visit.

## 2014-11-22 NOTE — Progress Notes (Signed)
Pt is transferred  to 4 N23 from 2 H. Admission vital sign is stable.

## 2014-11-22 NOTE — Progress Notes (Signed)
Pt is being discharged to [facility]. Discharge packages were given to transporter

## 2014-11-22 NOTE — Clinical Social Work Note (Signed)
Clinical Social Work Assessment  Patient Details  Name: Todd [name] MRN: [id number] Date of Birth: 07-14-1945  Date of referral:  11/22/14               Reason for consult:  Facility Placement                Permission sought to share information with:  Family Supports, Chartered certified accountant granted to share information::  Yes, Verbal Permission Granted  Name::     [name] (wife)  Agency::  [facility]Fs for referal purposes  Relationship::     Contact Information:     Housing/Transportation Living arrangements for the past 2 months:  Single Family Home Source of Information:  Spouse Patient Interpreter Needed:  None Criminal Activity/Legal Involvement Pertinent to Current Situation/Hospitalization:  No - Comment as needed Significant Relationships:  Spouse Lives with:  Spouse Do you feel safe going back to the place where you live?  No Need for family participation in patient care:  Yes (Comment) (Pt history TBI makes it difficult to communicate)  Care giving concerns:  PT recommending SNF, and per MD pt in agreement   Social Worker assessment / plan:  CSW visited pt room to discuss recommendation with pt. Pt able to communicate that he is agreeable to SNF and has been before. However, pt unable to verbalize additional information for facility preference. Pt agreeable to CSW calling his wife. CSW spoke with pt wife who confirmed plan is for pt to dc to SNF. Pt has been to [facility] and wife would prefer for him to return to this facility as they had an excellent experience.  CSW spoke with [facility] admissions and they confirmed they can offer a bed for pt today. MD made aware. CSW requested DC Summary be completed as soon as possible. CSW will speak with pt wife when she arrives at the hospital to notify and ask her to complete paperwork at [facility].  Employment status:    Insurance information:  Managed Medicare PT Recommendations:   [place] / Referral to community resources:  [place]  Patient/Family's Response to care:  Pt and pt wife in agreement that SNF is safest option at [employer].  Patient/Family's Understanding of and Emotional Response to Diagnosis, Current Treatment, and Prognosis:  Pt wife has a good understanding of pt condition. Pt wife in good spirits and has an appropriate emotional response to pt hospitalization.   Emotional Assessment Appearance:  Well-Groomed Attitude/Demeanor/Rapport:  Unable to Assess Affect (typically observed):  Unable to Assess Orientation:  Oriented to Self, Oriented to Place, Oriented to Situation Alcohol / Substance use:  Not Applicable Psych involvement (Current and /or in the community):  No (Comment)  Discharge Needs  Concerns to be addressed:  Discharge Planning Concerns Readmission within the last 30 days:  No Current discharge risk:  Dependent with Mobility Barriers to Discharge:  No Barriers Identified   [place], [place]

## 2014-11-22 NOTE — Progress Notes (Signed)
CSW (Clinical Social Worker) prepared pt dc packet and placed with shadow chart. CSW arranged non-emergent ambulance transport. Pt, pt family, pt nurse, and facility informed. CSW signing off.  Todd [name], LCSWA [phone]  

## 2014-11-22 NOTE — Clinical Social Work Placement (Signed)
   CLINICAL SOCIAL WORK PLACEMENT  NOTE  Date:  11/22/2014  Patient Details  Name: Todd [name] MRN: [id number] Date of Birth: Jan 15, 1946  Clinical Social Work is seeking post-discharge placement for this patient at the [place] level of care (*CSW will initial, date and re-position this form in  chart as items are completed):  Yes   Patient/family provided with [place] Work Department's list of facilities offering this level of care within the geographic area requested by the patient (or if unable, by the patient's family).  Yes   Patient/family informed of their freedom to choose among providers that offer the needed level of care, that participate in Medicare, Medicaid or managed care program needed by the patient, have an available bed and are willing to accept the patient.  Yes   Patient/family informed of [facility] [place]'s ownership interest in [facility] and [facility], as well as of the fact that they are under no obligation to receive care at these facilities.  PASRR submitted to EDS on       PASRR number received on       Existing PASRR number confirmed on 11/22/14     FL2 transmitted to all facilities in geographic area requested by pt/family on 11/22/14     FL2 transmitted to all facilities within larger geographic area on       Patient informed that his/her managed care company has contracts with or will negotiate with certain facilities, including the following:        Yes   Patient/family informed of bed offers received.  Patient chooses bed at [facility]     Physician recommends and patient chooses bed at      Patient to be transferred to [facility] on 11/22/14.  Patient to be transferred to facility by [facility]     Patient family notified on 11/22/14 of transfer.  Name of family member notified:  [name] (wife)     PHYSICIAN Please prepare priority discharge summary,  including medications, Please prepare prescriptions     Additional Comment:    _______________________________________________ [place], [place]
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
## 2014-11-22 NOTE — Progress Notes (Signed)
  Date: 11/22/2014  Patient name: Todd [name]  Medical record number: [id number]  Date of birth: 1946/03/09   This patient has been seen and the plan of care was discussed with the house staff. Please see their note for complete details. I concur with their findings with the following additions/corrections: [name] is now at hsi baseline and is interactive. C/O back pain today. ABX now off and remains afebrile. No bacterial source of fever found and doubt temp was from bacterial infxn. Stable for D/C SNF.  Bartholomew Crews, MD 11/22/2014, 3:21 PM

## 2014-11-22 NOTE — Discharge Summary (Signed)
Name: Todd [name] MRN: [id number] DOB: 1945/09/15 69 y.o. PCP: Aldine Contes, MD  Date of Admission: 11/20/2014  5:33 AM Date of Discharge: 11/22/2014 Attending Physician: Bartholomew Crews, MD  Discharge Diagnosis: Principal Problem:   Compression fracture of L2 lumbar vertebra Active Problems:   Personal history of traumatic brain injury   Hyperlipidemia   Osteoporosis with fracture   Chronic back pain   Coronary atherosclerosis of native coronary artery   Compression fracture of L4 lumbar vertebra   Chronic respiratory aspiration  Discharge Medications:   Medication List    STOP taking these medications        carvedilol 3.125 MG tablet  Commonly known as:  COREG      TAKE these medications        albuterol 108 (90 BASE) MCG/ACT inhaler  Commonly known as:  PROAIR HFA  inhale 2 puffs INTO THE LUNGS every 6 hours if needed for wheezing     aspirin 81 MG chewable tablet  Chew 1 tablet (81 mg total) by mouth daily.     atorvastatin 80 MG tablet  Commonly known as:  LIPITOR  Take 1 tablet (80 mg total) by mouth daily at 6 PM.     budesonide-formoterol 160-4.5 MCG/ACT inhaler  Commonly known as:  SYMBICORT  Inhale 2 puffs into the lungs 2 (two) times daily.     clopidogrel 75 MG tablet  Commonly known as:  PLAVIX  Take 1 tablet (75 mg total) by mouth every morning.     desonide 0.05 % cream  Commonly known as:  DESOWEN  Apply 1 application topically 2 (two) times daily.     DULoxetine 60 MG capsule  Commonly known as:  CYMBALTA  take 1 capsule by mouth once daily     fluticasone 50 MCG/ACT nasal spray  Commonly known as:  FLONASE  Place 1 spray into both nostrils daily.     guaifenesin 400 MG Tabs tablet  Commonly known as:  HUMIBID E  Take 400 mg by mouth 2 (two) times daily. Take every day per wife     isosorbide mononitrate 30 MG 24 hr tablet  Commonly known as:  IMDUR  Take 1 tablet (30 mg total) by mouth daily.     lidocaine 5 %    Commonly known as:  LIDODERM  Place 1 patch onto the skin every 12 (twelve) hours. Remove & Discard patch within 12 hours or as directed by MD     mirabegron ER 50 MG Tb24 tablet  Commonly known as:  MYRBETRIQ  Take 1 tablet (50 mg total) by mouth daily.     multivitamin with minerals Tabs tablet  Take 1 tablet by mouth every morning.     Oxycodone HCl 10 MG Tabs  Take 1 tablet (10 mg total) by mouth every 4 (four) hours as needed (For pain control).     pantoprazole 40 MG tablet  Commonly known as:  PROTONIX  Take 1 tablet (40 mg total) by mouth daily.        Disposition and follow-up:   Todd [name] was discharged from [facility] in Good condition.  At the hospital follow up visit please address:  1.  L2 compression fracture: Todd [name] was supported with pain control during hospital course. He had reduced walking ability and was discharged to SNF for PT/OT assistance. Assess his progress, as he was previously ambulatory with assistance/walker. His pain medicine was increased from 5mg  PO BID to  10mg  q4hrs PRN due to this acute pain. Reassess and adjust according to his requirements.  2.  CAD med management: His recent fall was associated with dizziness. The dizziness may be related to his Coreg which was restarted in June. He was previously discontinued from metoprolol due to dizziness with standing. At this time fall risk outweighs cardioprotective benefit of beta-blockers in this patient. Patient was previously on HCTZ, consider restarting other medications based on subsequent progress and BP control. He does not wish to pursue interventional CAD management.  3.  Chronic respiratory aspiration: Patient has poor airway protection due to impaired swallowing, chronic. Recommend access to NT suction and diet limited to thickened liquids.  4.  Osteoporosis: Spinal fractures from standing height fall represent significantly elevated fracture risk.  Outpatient evaluation may be pursued with DEXA. Cause of osteoporosis is not clear in this white male who weight bears. Hypogonadism might be possible but would not suggest checking testosterone levels as therapy in known CAD patient may not be beneficial to overall health.  Follow-up Appointments:     Follow-up Information    Follow up with Michail Jewels, MD On 11/26/2014.   Specialty:  Internal Medicine   Why:  at 8:45am, for Hospital follow up   Contact information:   [place] [address] [phone]       Discharge Instructions: Discharge Instructions    DIET DYS 2    Complete by:  As directed   Fluid consistency:  Nectar Thick     Increase activity slowly    Complete by:  As directed      Walk with assistance    Complete by:  As directed            Consultations:    Procedures Performed:  Dg Chest 2 View  10/23/2014   CLINICAL DATA:  Chest pain today.  EXAM: CHEST  2 VIEW  COMPARISON:  04/05/2014  FINDINGS: Lung volumes are low on the AP view. There is mild bibasilar atelectasis, right greater than left. No confluent airspace disease to suggest pneumonia. No pulmonary edema, pleural effusion, or pneumothorax. Old right-sided rib fractures are seen.  IMPRESSION: Bibasilar atelectasis, right greater than left.   Electronically Signed   By: Jeb Levering M.D.   On: 10/23/2014 22:37   Dg Thoracic Spine W/swimmers  11/20/2014   CLINICAL DATA:  Increasing number of falls.  Lumbago/low back pain.  EXAM: THORACIC SPINE - 3 VIEWS  COMPARISON:  Chest CT 10/24/2014.  Chest radiograph 10/23/2014.  FINDINGS: Chronic T12 compression fracture appears unchanged. Mild dextroconvex curve of the thoracic spine. No new thoracic spine compression fractures are identified. There is a mild dextroconvex curve with the apex at T8. Cervicothoracic junction not adequately visualized due to bony and soft tissue overlap.  IMPRESSION: No acute osseous abnormality. Chronic scoliosis and  chronic T12 compression fracture.   Electronically Signed   By: Dereck Ligas M.D.   On: 11/20/2014 09:10   Dg Lumbar Spine Complete  11/20/2014   CLINICAL DATA:  Lumbago after several recent falls  EXAM: LUMBAR SPINE - COMPLETE 4+ VIEW  COMPARISON:  October 24, 2014  FINDINGS: Frontal, lateral, spot lumbosacral lateral, and bilateral oblique views are obtained. There are 5 non-rib-bearing lumbar type vertebral bodies. There is lower lumbar levoscoliosis. There are stable anterior wedge compression fractures at T12, L1, and L3. There is an apparent new milder anterior wedge fracture at L2 as well as a fracture of the superior endplate of L4,  new. No other fractures. There is stable 5 mm of retrolisthesis of L3 on L4. There is no new spondylolisthesis. There is stable disc space narrowing at L3-4 posteriorly. There is facet osteoarthritic change at L5-S1 bilaterally.  IMPRESSION: New anterior wedge fracture at L2. New fracture of the anterior superior endplate of L4. Stable prior fractures at T12, L1, and L3. Stable spondylolisthesis at L3-4. Mild osteoarthritic change, stable.   Electronically Signed   By: Lowella Grip III M.D.   On: 11/20/2014 09:11   Dg Lumbar Spine Complete  10/24/2014   CLINICAL DATA:  Low back pain post recent fall.  EXAM: LUMBAR SPINE - COMPLETE 4+ VIEW  COMPARISON:  CT lumbar spine 05/08/2014  FINDINGS: Unchanged compression deformities of T12, L1, and L3. No new compression deformity. 5 mm retrolisthesis of L3 on L4 is unchanged from prior exam. The bones are under mineralized. The posterior elements appear intact. No evidence of acute fracture. Atherosclerosis noted of the abdominal aorta.  IMPRESSION: 1. Stable compression deformities of T12, L1, and L3. Unchanged retrolisthesis of L3 on L4. 2. No definite acute bony abnormality.   Electronically Signed   By: Jeb Levering M.D.   On: 10/24/2014 01:18   Ct Head Wo Contrast  11/20/2014   CLINICAL DATA:  Pain after fall 1 day  prior  EXAM: CT HEAD WITHOUT CONTRAST  CT CERVICAL SPINE WITHOUT CONTRAST  TECHNIQUE: Multidetector CT imaging of the head and cervical spine was performed following the standard protocol without intravenous contrast. Multiplanar CT image reconstructions of the cervical spine were also generated.  COMPARISON:  CT cervical spine July 28, 2013; head CT May 08, 2014  FINDINGS: CT HEAD FINDINGS  There remains extensive encephalomalacia on the right with a post craniotomy defect in the right temporal region. Moderate underlying atrophy is also present. There is no intracranial mass, hemorrhage, extra-axial fluid collection, or midline shift. Diffuse decreased attenuation throughout much of the right frontal and temporal lobes is consistent with prior encephalomalacia/infarction. There is also evidence of a prior infarct in the inferior left frontal lobe. There is small vessel disease in the centra semiovale bilaterally as well as in the posterior limb of the right internal capsule and in the anterior limb of the left external capsule. There is also mild small vessel disease in the right thalamus. There is no new gray-white compartment lesion. No acute infarct. Bony calvarium appears intact except for the previously noted right temporal craniotomy defect. Mastoid air cells are clear.  CT CERVICAL SPINE FINDINGS  There is no fracture or spondylolisthesis. Prevertebral soft tissues and predental space regions are normal. There is moderately severe disc space narrowing at C5-6 and C6-7. There is moderate narrowing at C4-5 and C7-T1. There is facet osteoarthritic change and hypertrophy at most levels bilaterally, most severe at C4-5 on the right. There are foci of carotid artery calcification on the left.  IMPRESSION: CT head: Prior infarcts and encephalomalacia, stable. Atrophy with small vessel disease, stable. Post craniotomy defect on the right, stable. No intracranial mass, hemorrhage, or extra-axial fluid. No acute  infarct.  CT cervical spine: Multilevel osteoarthritic change. No fracture or spondylolisthesis. Left-sided carotid artery calcification.   Electronically Signed   By: Lowella Grip III M.D.   On: 11/20/2014 08:49   Ct Angio Chest Pe W/cm &/or Wo Cm  10/24/2014   CLINICAL DATA:  Chest pain, onset at lunch time yesterday.  Dyspnea.  EXAM: CT ANGIOGRAPHY CHEST WITH CONTRAST  TECHNIQUE: Multidetector CT imaging of the  chest was performed using the standard protocol during bolus administration of intravenous contrast. Multiplanar CT image reconstructions and MIPs were obtained to evaluate the vascular anatomy.  CONTRAST:  15mL OMNIPAQUE IOHEXOL 350 MG/ML SOLN  COMPARISON:  05/15/2013  FINDINGS: Cardiovascular: There is good opacification of the pulmonary arteries. There is no pulmonary embolism. The thoracic aorta is normal in caliber and intact. There is extensive coronary artery calcification.  Lungs: Scattered ground-glass opacities in a mosaic distribution, suggesting air trapping. No masses or nodules. Subpleural reticular interstitial coarsening is present, likely fibrotic.  Central airways: Patent  Effusions: None  Lymphadenopathy: None  Esophagus: Mild esophageal mural thickening, diffuse. This could represent esophagitis.  Upper abdomen: No significant abnormality  Musculoskeletal: No significant abnormality  Review of the MIP images confirms the above findings.  IMPRESSION: 1. Negative for pulmonary embolism 2. Mild chronic appearing interstitial coarsening. Mild mosaic ground-glass opacities suggesting air trapping. 3. Mild diffuse esophageal mural thickening, suggesting esophagitis.   Electronically Signed   By: Andreas Newport M.D.   On: 10/24/2014 04:38   Ct Cervical Spine Wo Contrast  11/20/2014   CLINICAL DATA:  Pain after fall 1 day prior  EXAM: CT HEAD WITHOUT CONTRAST  CT CERVICAL SPINE WITHOUT CONTRAST  TECHNIQUE: Multidetector CT imaging of the head and cervical spine was performed  following the standard protocol without intravenous contrast. Multiplanar CT image reconstructions of the cervical spine were also generated.  COMPARISON:  CT cervical spine July 28, 2013; head CT May 08, 2014  FINDINGS: CT HEAD FINDINGS  There remains extensive encephalomalacia on the right with a post craniotomy defect in the right temporal region. Moderate underlying atrophy is also present. There is no intracranial mass, hemorrhage, extra-axial fluid collection, or midline shift. Diffuse decreased attenuation throughout much of the right frontal and temporal lobes is consistent with prior encephalomalacia/infarction. There is also evidence of a prior infarct in the inferior left frontal lobe. There is small vessel disease in the centra semiovale bilaterally as well as in the posterior limb of the right internal capsule and in the anterior limb of the left external capsule. There is also mild small vessel disease in the right thalamus. There is no new gray-white compartment lesion. No acute infarct. Bony calvarium appears intact except for the previously noted right temporal craniotomy defect. Mastoid air cells are clear.  CT CERVICAL SPINE FINDINGS  There is no fracture or spondylolisthesis. Prevertebral soft tissues and predental space regions are normal. There is moderately severe disc space narrowing at C5-6 and C6-7. There is moderate narrowing at C4-5 and C7-T1. There is facet osteoarthritic change and hypertrophy at most levels bilaterally, most severe at C4-5 on the right. There are foci of carotid artery calcification on the left.  IMPRESSION: CT head: Prior infarcts and encephalomalacia, stable. Atrophy with small vessel disease, stable. Post craniotomy defect on the right, stable. No intracranial mass, hemorrhage, or extra-axial fluid. No acute infarct.  CT cervical spine: Multilevel osteoarthritic change. No fracture or spondylolisthesis. Left-sided carotid artery calcification.   Electronically  Signed   By: Lowella Grip III M.D.   On: 11/20/2014 08:49   Dg Chest Port 1 View  11/20/2014   CLINICAL DATA:  Cough and shortness of breath tonight.  EXAM: PORTABLE CHEST - 1 VIEW  COMPARISON:  Radiographs and chest CT 10/24/2014  FINDINGS: Lung volumes are low. The cardiomediastinal contours are unchanged. Bronchial thickening again seen. Bibasilar opacities consistent with atelectasis, right greater than left, slightly worsened from prior exam. Pulmonary vasculature  is normal. No consolidation, pleural effusion, or pneumothorax. No acute osseous abnormalities are seen.  IMPRESSION: Hypoventilatory chest with bibasilar atelectasis.   Electronically Signed   By: Jeb Levering M.D.   On: 11/20/2014 23:17    Admission HPI: Todd [name] is 69 yo male with PMH of traumatic brain injury, stroke, chronic back pain, depression, COPD, HTN and GERD who presents with his wife with CC of fall and back pain. The patient is unable to provide his own history but can answer yes or no to some questions. Per his wife he has had increased gait instability and increasing falls for about the last month since his antihypertensive medications were changed from HCTZ to carvedilol and IMDUR. She reports that he had similar issues with falls last year when he was taking metoprolol. She reports that over the past few weeks he has fallen at least 5-6 times. He does report a fall 3 days ago but seemed to be fine afterwards, then yesterday she reports he was moving around with his walker and she quickly took the trash out, when she returned he had already fallen to the ground. After the fall she helped him up and though that he was doing OK however this morning she had trouble helping him stand up and he was complaining of back pain. She gave him his usual Oxycodone 5mg  but this seemed ineffective and she was having trouble helping him stand so she called EMS to bring him to the ED for evaluation. She does reports  that he complaints of intermittent dizziness associated with these falls.  In the ED he was found to have new compression fractures. He was not able to stand (2/2 pain) and wife does not feel she can provide a safe environment if he were to return home.  Hospital Course by problem list: Compression fracture of L2 lumbar vertebra: Admitted 7/26 after worsening pain from ground height fall. Xray obtained in ED demonstrating new L2 vertebral fracture. Head CT and thoracic spine xray showed chronic changes but no acute process. Neurological assessment indicated no new deficits. Pain control initially with IV dilaudid, transitioned to PO pain control by 7/27 PM. PT/OT evaluation with high assistance recommending SNF care. Placement arranged at [facility] which has previously treated Todd [name]  Chronic respiratory aspiration: Patient became tachycardic, low grade fever, hypoxia to 80s% o/n after admission. Was transferred to stepdown unit for concern of possible aspiration pneumonia. IV abx with vanc/zosyn initiated. CXR, blood cx, UA, lactic acid all negative for signs of infection. Titrated from NRB to tolerating room air. Abx discontinued given clinical improvement with only airway suctioning and pain control.  Coronary atherosclerosis of native coronary artery: No complaints of acute chest pain at admission. Troponins negative x3 and no acute EKG changes noted. Coreg discontinued given history of dizziness and falls worsening since medication was restarted one month ago. Imdur, aspirin, plavix, lipitor continued per cardiology recommendations from June 2016.  Discharge Vitals:   BP 126/62 mmHg  Pulse 95  Temp(Src) 98.6 F (37 C) (Oral)  Resp 17  Wt 78.245 kg (172 lb 8 oz)  SpO2 93%  Discharge Labs:  No results found for this or any previous visit (from the past 24 hour(s)).  Signed: Collier Salina, MD 11/22/2014, 1:46 PM   Services Ordered on Discharge: Discharge to SNF for  PT/OT

## 2014-11-26 ENCOUNTER — Ambulatory Visit: Payer: PPO | Admitting: Internal Medicine

## 2014-11-26 LAB — CULTURE, BLOOD (ROUTINE X 2)
CULTURE: NO GROWTH
Culture: NO GROWTH

## 2014-11-28 ENCOUNTER — Other Ambulatory Visit: Payer: Self-pay | Admitting: Internal Medicine

## 2014-12-18 ENCOUNTER — Ambulatory Visit (INDEPENDENT_AMBULATORY_CARE_PROVIDER_SITE_OTHER): Payer: PPO | Admitting: Internal Medicine

## 2014-12-18 ENCOUNTER — Encounter: Payer: Self-pay | Admitting: Internal Medicine

## 2014-12-18 VITALS — BP 121/67 | HR 77 | Temp 97.4°F | Ht 72.0 in | Wt 176.7 lb

## 2014-12-18 DIAGNOSIS — Z87820 Personal history of traumatic brain injury: Secondary | ICD-10-CM

## 2014-12-18 DIAGNOSIS — S32040D Wedge compression fracture of fourth lumbar vertebra, subsequent encounter for fracture with routine healing: Secondary | ICD-10-CM

## 2014-12-18 DIAGNOSIS — W19XXXD Unspecified fall, subsequent encounter: Secondary | ICD-10-CM

## 2014-12-18 DIAGNOSIS — S32020D Wedge compression fracture of second lumbar vertebra, subsequent encounter for fracture with routine healing: Secondary | ICD-10-CM

## 2014-12-18 DIAGNOSIS — F332 Major depressive disorder, recurrent severe without psychotic features: Secondary | ICD-10-CM

## 2014-12-18 DIAGNOSIS — I2510 Atherosclerotic heart disease of native coronary artery without angina pectoris: Secondary | ICD-10-CM

## 2014-12-18 MED ORDER — DULOXETINE HCL 30 MG PO CPEP: 30.0000 mg | ORAL_CAPSULE | Freq: Every day | ORAL | Status: DC

## 2014-12-18 NOTE — Assessment & Plan Note (Signed)
-   Wife wants referral to Dr. [name] who is a neurologist who specializes in movement disorders - Given patients multiple falls and occasional tremors and history of TBI he will need neuro follow up - Referral ordered today

## 2014-12-18 NOTE — Assessment & Plan Note (Addendum)
-   Patient with recent L4 and L2 compression fractures after a fall from standing height - He also has multiple old compression fractures - Will obtain DEXA scan to assess for osteoporosis. Given multiple fractures from fall from standing height he will likely need a bisphosphonate  - Discussed with patient and wife in detail including side effects of bisphosponates and they are in agreement with plan - Will c/w oxy IR 5 mg prn pain. Patient has pills at home and does not require refill at this time

## 2014-12-18 NOTE — Assessment & Plan Note (Addendum)
-   Patient unable to tolerate beta blocker given recurrent falls with both metoprolol and coreg - Would not resume beta blocker at all given risk of falls  - Wife and patient are in agreement - c/w asa, lipitor, imdur

## 2014-12-18 NOTE — Patient Instructions (Signed)
-   It was a pleasure seeing you today - We will continue with oxy 5 mg for your back pain as needed - We will reefer you to Dr. [name] for your recurrent falls - We have decreased your celexa dose to 30 mg from 60 mg - We will check a DEXA scan given recurrent lumbar compression fractures. You may require a bisphosphonate

## 2014-12-18 NOTE — Progress Notes (Signed)
   Subjective:    Patient ID: Todd [name], male    DOB: 1946/03/26, 69 y.o.   MRN: [id number]  HPI Patient seen and examined. He is here for follow up of his L2 and L4 compression fractures he sustained after a fall last month. He was admitted to [facility] hospital at the time and pain was controlled and he was discharged to [facility]. He has now improved. Still complains of mild back pain but controlled on pain meds.  Wife also wants a referral to Dr. [name] who is a movement disorder neurologist given his recurrent falls and occasional episodes of his leg "giving" out on him   Review of Systems  Constitutional: Negative.   HENT: Negative.   Respiratory: Negative.   Cardiovascular: Negative.   Gastrointestinal: Negative.   Musculoskeletal: Positive for back pain and gait problem. Negative for myalgias, joint swelling and arthralgias.  Neurological: Negative for dizziness, seizures, light-headedness and numbness.  Psychiatric/Behavioral: Negative.        Objective:   Physical Exam  Constitutional: He is oriented to person, place, and time. He appears well-developed and well-nourished.  HENT:  Head: Normocephalic and atraumatic.  Eyes: Conjunctivae are normal.  Neck: Normal range of motion.  Cardiovascular: Normal rate, regular rhythm and normal heart sounds.   Pulmonary/Chest: Breath sounds normal. No respiratory distress. He has no wheezes.  Abdominal: Soft. Bowel sounds are normal. He exhibits no distension. There is no tenderness.  Musculoskeletal: Normal range of motion. He exhibits no edema.  Neurological: He is alert and oriented to person, place, and time.  Skin: Skin is warm and dry.  Psychiatric: He has a normal mood and affect. His behavior is normal.          Assessment & Plan:  Please see problem based charting for assessment and plan:

## 2014-12-18 NOTE — Assessment & Plan Note (Signed)
-   Patient and wife state mood is much better and they would like to attempt to taper celexa off - Will decrease dose to 30 mg from 60 mg and monitor - If continues to do well will attempt to taper off

## 2015-01-11 ENCOUNTER — Encounter: Payer: Self-pay | Admitting: Neurology

## 2015-01-11 ENCOUNTER — Ambulatory Visit (INDEPENDENT_AMBULATORY_CARE_PROVIDER_SITE_OTHER): Payer: PPO | Admitting: Neurology

## 2015-01-11 VITALS — BP 118/60 | HR 97

## 2015-01-11 DIAGNOSIS — R569 Unspecified convulsions: Secondary | ICD-10-CM

## 2015-01-11 DIAGNOSIS — R404 Transient alteration of awareness: Secondary | ICD-10-CM | POA: Diagnosis not present

## 2015-01-11 DIAGNOSIS — S0291XS Unspecified fracture of skull, sequela: Secondary | ICD-10-CM

## 2015-01-11 NOTE — Patient Instructions (Signed)
1. Schedule MRI brain with and without contrast 2. Schedule routine EEG, then 24-hour EEG 3. Continue all your current medications 4. Follow-up after the tests
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
## 2015-01-16 DIAGNOSIS — R404 Transient alteration of awareness: Secondary | ICD-10-CM | POA: Insufficient documentation

## 2015-01-16 DIAGNOSIS — S069X9A Unspecified intracranial injury with loss of consciousness of unspecified duration, initial encounter: Secondary | ICD-10-CM | POA: Insufficient documentation

## 2015-01-16 DIAGNOSIS — G40909 Epilepsy, unspecified, not intractable, without status epilepticus: Secondary | ICD-10-CM | POA: Insufficient documentation

## 2015-01-16 DIAGNOSIS — S069XAA Unspecified intracranial injury with loss of consciousness status unknown, initial encounter: Secondary | ICD-10-CM | POA: Insufficient documentation

## 2015-01-24 ENCOUNTER — Telehealth: Payer: Self-pay | Admitting: *Deleted

## 2015-01-24 NOTE — Telephone Encounter (Signed)
Pt's wife calls and states she wants to get his plavix at the [facility] in [place], she states in order to do so pt's pcp must fax a script and progress notes to dr Colletta [place] borum, fax: [phone] and ph# [phone] If you have questions you may notify pt's wife

## 2015-01-25 NOTE — Telephone Encounter (Signed)
Let me know if there is anything else that you need. Thank you Todd [name]

## 2015-01-25 NOTE — Telephone Encounter (Signed)
Notes faxed.

## 2015-01-25 NOTE — Telephone Encounter (Signed)
Dr borum called and she states she did receive progress notes but they were not helpful, she needs a clear documented reason why pt must continue plavix. Please fax to [phone] or to speak w/ someone [phone]

## 2015-01-28 ENCOUNTER — Ambulatory Visit (INDEPENDENT_AMBULATORY_CARE_PROVIDER_SITE_OTHER): Payer: PPO | Admitting: Neurology

## 2015-01-28 DIAGNOSIS — R404 Transient alteration of awareness: Secondary | ICD-10-CM | POA: Diagnosis not present

## 2015-01-28 DIAGNOSIS — R569 Unspecified convulsions: Secondary | ICD-10-CM | POA: Diagnosis not present

## 2015-01-29 ENCOUNTER — Ambulatory Visit (HOSPITAL_COMMUNITY): Admission: RE | Admit: 2015-01-29 | Payer: PPO | Source: Ambulatory Visit

## 2015-01-29 NOTE — Procedures (Signed)
ELECTROENCEPHALOGRAM REPORT  Date of Study: 01/28/2015  [name] Name: Todd [name] MRN: [id number] Date of Birth: January 12, 1946  Referring Provider: Dr. Ellouise Newer  Clinical History: This is a 69 year old man with a history of TBI s/p right craniotomy with recurrent episodes of left foot getting "stuck" with associated blank stare and mild shaking, lasting a few minutes.  Medications: Cymbalta, Albuterol, ASA, Lipitor, Symbicort, Plavix,Flonase, Humibid E, Imdur, Myrbetriq, Protonix  Technical Summary: A multichannel digital EEG recording measured by the international 10-20 system with electrodes applied with paste and impedances below 5000 ohms performed in our laboratory with EKG monitoring in an awake and drowsy patient.  Hyperventilation was not performed. Photic stimulation was performed.  The digital EEG was referentially recorded, reformatted, and digitally filtered in a variety of bipolar and referential montages for optimal display.    Description: The patient is awake and drowsy during the recording.  During maximal wakefulness, there is an asymmetric, medium voltage 8-8.5 Hz posterior dominant rhythm better formed over the left occipital region. There is near-continuous focal 5-7 Hz theta and occasional 2-3 Hz delta slowing over the right hemisphere, with breach artifact over the right frontocentral region. At times slowing is sharply contoured over C4. During drowsiness, there is an increase in theta and delta slowing of the background.  Deeper stages of sleep were not seen. Photic stimulation did not elicit any abnormalities. He is noted to have jerking of the left foot (not seen on video) with no associated epileptiform correlate. There were no clear epileptiform discharges or electrographic seizures seen.    EKG lead was unremarkable.  Impression: This awake and drowsy EEG is abnormal due to the presence of: 1. Focal slowing over the right hemisphere 2. Breach artifact  over the right frontocentral region  Clinical Correlation of the above findings indicates focal cerebral dysfunction over the right hemisphere suggestive of underlying structural or physiologic abnormality. Breach artifact is consistent with prior craniotomy in this region. The absence of epileptiform discharges does not exclude a clinical diagnosis of epilepsy.  If further clinical questions remain, prolonged EEG may be helpful.  Clinical correlation is advised.   Ellouise Newer, M.D.

## 2015-01-30 ENCOUNTER — Telehealth: Payer: Self-pay | Admitting: Family Medicine

## 2015-01-30 DIAGNOSIS — R404 Transient alteration of awareness: Secondary | ICD-10-CM

## 2015-01-30 DIAGNOSIS — R569 Unspecified convulsions: Secondary | ICD-10-CM

## 2015-01-30 NOTE — Telephone Encounter (Signed)
-----   Message from Cameron Sprang, MD sent at 01/30/2015 12:09 PM EDT ----- Please let patient know routine EEG did not show any seizure discharges, would proceed with 24-hour EEG as discussed.

## 2015-01-30 NOTE — Telephone Encounter (Signed)
[name] wife/[name] returned my call. Notified her result & advisement. They will proceed with 24 hour eeg. Order put in [place] for Manuela Schwartz to call and schedule.

## 2015-01-30 NOTE — Telephone Encounter (Signed)
Lmovm to rtn my call. 

## 2015-02-07 ENCOUNTER — Ambulatory Visit (HOSPITAL_COMMUNITY)
Admission: RE | Admit: 2015-02-07 | Discharge: 2015-02-07 | Disposition: A | Discharge status: Home / Self Care | Payer: PPO | Source: Ambulatory Visit | Attending: Neurology | Admitting: Neurology

## 2015-02-07 DIAGNOSIS — R531 Weakness: Secondary | ICD-10-CM | POA: Diagnosis not present

## 2015-02-07 DIAGNOSIS — R569 Unspecified convulsions: Secondary | ICD-10-CM | POA: Insufficient documentation

## 2015-02-07 DIAGNOSIS — G319 Degenerative disease of nervous system, unspecified: Secondary | ICD-10-CM | POA: Insufficient documentation

## 2015-02-07 DIAGNOSIS — R404 Transient alteration of awareness: Secondary | ICD-10-CM | POA: Insufficient documentation

## 2015-02-07 DIAGNOSIS — R200 Anesthesia of skin: Secondary | ICD-10-CM | POA: Diagnosis not present

## 2015-02-07 DIAGNOSIS — Z87820 Personal history of traumatic brain injury: Secondary | ICD-10-CM | POA: Diagnosis not present

## 2015-02-07 DIAGNOSIS — G9389 Other specified disorders of brain: Secondary | ICD-10-CM | POA: Insufficient documentation

## 2015-02-07 LAB — CREATININE, SERUM
Creatinine, Ser: 0.9 mg/dL (ref 0.61–1.24)
GFR calc Af Amer: >60 mL/min (ref >60)

## 2015-02-07 MED ORDER — GADOBENATE DIMEGLUMINE 529 MG/ML IV SOLN
15.0000 mL | Freq: Once | INTRAVENOUS | Status: AC | PRN
  Administered 2015-02-07: 15 mL via INTRAVENOUS

## 2015-02-08 ENCOUNTER — Telehealth: Payer: Self-pay | Admitting: Family Medicine

## 2015-02-08 NOTE — Telephone Encounter (Signed)
[name] wife/[name] notified of results. 24 hour eeg is scheduled for 10/19.

## 2015-02-08 NOTE — Telephone Encounter (Signed)
-----   Message from Cameron Sprang, MD sent at 02/08/2015  8:44 AM EDT ----- Pls let patient and wife know the MRI brain does not show any new changes, similar to scan in 2011. Proceed with 24-hr EEG as discussed. Thanks

## 2015-02-09 MED ORDER — GADOBENATE DIMEGLUMINE 529 MG/ML IV SOLN
15.0000 mL | Freq: Once | INTRAVENOUS | Status: AC | PRN
  Administered 2015-02-07: 15 mL via INTRAVENOUS

## 2015-02-12 ENCOUNTER — Ambulatory Visit: Payer: PPO | Admitting: Neurology

## 2015-02-13 ENCOUNTER — Other Ambulatory Visit: Payer: PPO

## 2015-02-20 ENCOUNTER — Ambulatory Visit (INDEPENDENT_AMBULATORY_CARE_PROVIDER_SITE_OTHER): Payer: PPO | Admitting: Neurology

## 2015-02-20 DIAGNOSIS — R569 Unspecified convulsions: Secondary | ICD-10-CM | POA: Diagnosis not present

## 2015-02-28 ENCOUNTER — Telehealth: Payer: Self-pay | Admitting: Neurology

## 2015-02-28 DIAGNOSIS — G40109 Localization-related (focal) (partial) symptomatic epilepsy and epileptic syndromes with simple partial seizures, not intractable, without status epilepticus: Secondary | ICD-10-CM

## 2015-02-28 NOTE — Telephone Encounter (Signed)
Spoke to wife regarding EEG results, showing occasional right central spikes (C4). Would recommend starting seizure medication. Wife reports that he had another episode once they got home, she is concerned these are due to panic attacks, because when they profoundly affect him, he is thinking "what is going to happen to me," and that his wife might leave him. Discussed starting lamotrigine, which could potentially also help with mood stabilization. Discussed side effects, including risk for [employer] syndrome. Wife expressed understanding and agreement to start medication, requesting I [facility] Herold Harms [phone] to have her send the prescription. Office now closed, will call in AM.  Wife also asked about Etanercept and TBI, discussed that at this point this is not yet FDA approved for this indication.

## 2015-03-01 MED ORDER — LAMOTRIGINE 25 MG PO TABS
ORAL_TABLET | ORAL | Status: DC
Start: 2015-03-01 — End: 2015-06-18

## 2015-03-01 NOTE — Telephone Encounter (Signed)
10am: Tried calling Dr. Rondell Reams, no answer on phone, no voicemail set up. Will try again later.

## 2015-03-01 NOTE — Telephone Encounter (Signed)
Spoke to her nurse Beth. They need a copy of prescription, office note, and EEG, fax to [facility] [phone] Let wife know to get first 30 days filled at local pharmacy. Some Rx needs to be done by Neurologist and not sure if Dr. Rondell Reams can do this but to send Rx to them.

## 2015-03-01 NOTE — Telephone Encounter (Signed)
Spoke to wife about sending Rx first to local pharmacy. She may have a problem and not fill until January 1 due to doughnut hole, but will try. Start lamotrigine 25mg  BID x 2 weeks, then increase to 2 tablets twice a day. Rx sent

## 2015-03-13 NOTE — Procedures (Signed)
ELECTROENCEPHALOGRAM REPORT  Dates of Recording: 02/20/2015 to 02/21/2015  [name] Name: Todd [name] MRN: [id number] Date of Birth: 1945/08/18  Referring Provider: Dr. Ellouise Newer  Procedure: 24-hour ambulatory EEG  History: This is a 69 year old man with a history of TBI s/p right craniotomy with recurrent episodes of left foot getting "stuck" with associated blank stare and mild shaking, lasting a few minutes. EEG for classification.  Medications: Albuterol, ASA, Lipitor, Symbicort, Plavix. Desowen, Cymbalta, Flonase, Humibid E, Imdur, Myrbetriq, multivitamin with minerals. Protonix  Technical Summary: This is a 24-hour multichannel digital EEG recording measured by the international 10-20 system with electrodes applied with paste and impedances below 5000 ohms performed as portable with EKG monitoring.  The digital EEG was referentially recorded, reformatted, and digitally filtered in a variety of bipolar and referential montages for optimal display.    DESCRIPTION OF RECORDING: During maximal wakefulness, the background activity consisted of a symmetric 8.5 Hz posterior dominant rhythm which was reactive to eye opening.  There is near-continuous focal 4-5 Hz theta slowing seen over the right hemisphere, maximal over the right centrotemporal regions. Breach artifact with higher amplitude and sharply contoured activity is seen over this region. There were occasional right central (phase reversal at C4) spike discharges seen.  During the recording, the patient progresses through wakefulness, drowsiness, and Stage 2 sleep.  Similar focal slowing and breach artifact is seen over the right hemisphere. Again, occasional right central spike discharges are seen.   Events: There were no push button events.   There were no electrographic seizures seen.  EKG lead was unremarkable.  IMPRESSION: This 24-hour ambulatory EEG study is abnormal due to the presence of: 1. Focal slowing over  the right hemisphere, maximal over the right centrotemporal region 2. Occasional right central (C4) spike discharges 3. Breach artifact over the right hemisphere  CLINICAL CORRELATION of the above findings indicates focal cerebral dysfunction over the right hemisphere suggestive of underlying structural or physiologic abnormality. Occasional spike discharges seen over the right central region indicate a tendency for seizures to arise from this region. Breach artifact is consistent with history of craniotomy in this region. Typical events were not captured. There were no electrographic seizures seen in this study. If further clinical questions remain, inpatient video EEG monitoring may be helpful.   Ellouise Newer, M.D.

## 2015-03-20 ENCOUNTER — Encounter: Payer: Self-pay | Admitting: Neurology

## 2015-03-20 MED ORDER — LAMOTRIGINE 25 MG PO TABS: ORAL_TABLET | ORAL | Status: DC

## 2015-03-24 ENCOUNTER — Emergency Department (HOSPITAL_COMMUNITY)
Admission: EM | Admit: 2015-03-24 | Discharge: 2015-03-24 | Disposition: A | Discharge status: Home / Self Care | Payer: PPO | Attending: Emergency Medicine | Admitting: Emergency Medicine

## 2015-03-24 ENCOUNTER — Encounter (HOSPITAL_COMMUNITY): Payer: Self-pay | Admitting: *Deleted

## 2015-03-24 DIAGNOSIS — Z7902 Long term (current) use of antithrombotics/antiplatelets: Secondary | ICD-10-CM | POA: Insufficient documentation

## 2015-03-24 DIAGNOSIS — Z7982 Long term (current) use of aspirin: Secondary | ICD-10-CM | POA: Diagnosis not present

## 2015-03-24 DIAGNOSIS — Z87820 Personal history of traumatic brain injury: Secondary | ICD-10-CM | POA: Diagnosis not present

## 2015-03-24 DIAGNOSIS — B029 Zoster without complications: Secondary | ICD-10-CM | POA: Insufficient documentation

## 2015-03-24 DIAGNOSIS — Z8701 Personal history of pneumonia (recurrent): Secondary | ICD-10-CM | POA: Diagnosis not present

## 2015-03-24 DIAGNOSIS — Z8711 Personal history of peptic ulcer disease: Secondary | ICD-10-CM | POA: Insufficient documentation

## 2015-03-24 DIAGNOSIS — R21 Rash and other nonspecific skin eruption: Secondary | ICD-10-CM | POA: Diagnosis present

## 2015-03-24 DIAGNOSIS — Z87828 Personal history of other (healed) physical injury and trauma: Secondary | ICD-10-CM | POA: Insufficient documentation

## 2015-03-24 DIAGNOSIS — J449 Chronic obstructive pulmonary disease, unspecified: Secondary | ICD-10-CM | POA: Insufficient documentation

## 2015-03-24 DIAGNOSIS — Q670 Congenital facial asymmetry: Secondary | ICD-10-CM | POA: Insufficient documentation

## 2015-03-24 DIAGNOSIS — M545 Low back pain: Secondary | ICD-10-CM | POA: Diagnosis not present

## 2015-03-24 DIAGNOSIS — Z85828 Personal history of other malignant neoplasm of skin: Secondary | ICD-10-CM | POA: Diagnosis not present

## 2015-03-24 DIAGNOSIS — Z79899 Other long term (current) drug therapy: Secondary | ICD-10-CM | POA: Insufficient documentation

## 2015-03-24 DIAGNOSIS — K219 Gastro-esophageal reflux disease without esophagitis: Secondary | ICD-10-CM | POA: Insufficient documentation

## 2015-03-24 DIAGNOSIS — Z87891 Personal history of nicotine dependence: Secondary | ICD-10-CM | POA: Diagnosis not present

## 2015-03-24 LAB — BASIC METABOLIC PANEL
Anion gap: 6 (ref 5–15)
BUN: 16 mg/dL (ref 6–20)
CO2: 26 mmol/L (ref 22–32)
Calcium: 9.1 mg/dL (ref 8.9–10.3)
Chloride: 108 mmol/L (ref 101–111)
Creatinine, Ser: 0.92 mg/dL (ref 0.61–1.24)
GFR calc Af Amer: >60 mL/min (ref >60)
GFR calc non Af Amer: >60 mL/min (ref >60)
Glucose, Bld: 91 mg/dL (ref 65–99)
Potassium: 4.1 mmol/L (ref 3.5–5.1)
Sodium: 140 mmol/L (ref 135–145)

## 2015-03-24 LAB — CBC
HCT: 44.3 % (ref 39.0–52.0)
HEMOGLOBIN: 14.3 g/dL (ref 13.0–17.0)
MCH: 32.4 pg (ref 26.0–34.0)
MCHC: 32.3 g/dL (ref 30.0–36.0)
MCV: 100.5 fL — ABNORMAL HIGH (ref 78.0–100.0)
Platelets: 219 10*3/uL (ref 150–400)
RBC: 4.41 MIL/uL (ref 4.22–5.81)
RDW: 13.7 % (ref 11.5–15.5)
WBC: 10 10*3/uL (ref 4.0–10.5)

## 2015-03-24 MED ORDER — PREDNISONE 50 MG PO TABS: 50.0000 mg | ORAL_TABLET | Freq: Every day | ORAL | Status: DC

## 2015-03-24 MED ORDER — VALACYCLOVIR HCL 1 G PO TABS: 1000.0000 mg | ORAL_TABLET | Freq: Three times a day (TID) | ORAL | Status: DC

## 2015-03-24 NOTE — ED Notes (Signed)
MD at bedside. 
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
## 2015-03-24 NOTE — Discharge Instructions (Signed)

## 2015-03-24 NOTE — ED Notes (Signed)
Patient had redness and swelling to left side of face around left nares. pts wife reports the swelling began on Friday and two red dots also appeared on patient's face over the weekend. Pt reports pts pcp advised patient to come to the ED since he recently started lamictal 3 weeks ago.
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
## 2015-03-25 ENCOUNTER — Telehealth: Payer: Self-pay | Admitting: Family Medicine

## 2015-03-25 NOTE — Telephone Encounter (Signed)
Faxed after hours message on 11/27/ @ 1:07 pm from [place] Stallings(patient's wife): On call doctor was paged and they were advised to be seen within the next 3 or 4 hours at a nearby urgent care if that wasn't possible patient could also be seen in the ER. Advised to go sooner if sxs worsened.  Msg states that patient has just started on lamictal. States that he had an ingrown hair under his nostril. He had places that were hard like a bump/bug bite, also had a place on his cheek and lip which was red and had "hard" places. His legs felt heavy. Also states that patient has been much more dizzy than normal. States he was "super" dizzy.   Dr. Delice Lesch did review the message and advisement was to stop the lamictal because of the dizziness and to f/u with pcp for the rash.  I did speak with [name] and advised for patient to stop the Lamcital. She states that she did take patient to the ER yesterday and he was dx with shingles and put on medication. She also stated that it seemed like he had less "spells" while on the Lamictal. She states she will stop giving patient Lamictal. I did advise her to give [place] a call back if any further issues or questions.

## 2015-03-29 ENCOUNTER — Encounter: Payer: Self-pay | Admitting: Internal Medicine

## 2015-03-29 ENCOUNTER — Ambulatory Visit (INDEPENDENT_AMBULATORY_CARE_PROVIDER_SITE_OTHER): Payer: PPO | Admitting: Internal Medicine

## 2015-03-29 DIAGNOSIS — B029 Zoster without complications: Secondary | ICD-10-CM | POA: Diagnosis not present

## 2015-03-29 DIAGNOSIS — M8080XD Other osteoporosis with current pathological fracture, unspecified site, subsequent encounter for fracture with routine healing: Secondary | ICD-10-CM | POA: Diagnosis not present

## 2015-03-29 MED ORDER — ALENDRONATE SODIUM 70 MG PO TABS: 70.0000 mg | ORAL_TABLET | ORAL | Status: AC

## 2015-03-29 NOTE — Patient Instructions (Signed)
[name],  It was a pleasure meeting you and your wife today.  I'm so glad you got your shingles treated within 3 days to hopefully prevent "postherpetic neuralgia" as we discussed, and the rash seems to have gone away totally. I think there may have been some involvement with a nerve that goes to your ear that caused your vertigo. Don't forget to ask the audiologist about your hearing when you see them next week. If your rash comes back, don't hesitate to give [place] a call, and we can prescribe you more Valtrex. I don't think this was caused by your Lamictal, so I don't think Dr. Delice Lesch will have a problem restarting it, but give her a call to double check.  Regarding your osteoporosis, I think it is a good idea to start taking alendronate. As Dr. Evette Doffing said, this will reduce the risk of fractures by 50%. I printed a prescription for you to take to [facility]. Most people take this medicine without a problem, but if you start having headaches, or abdominal pain, the 2 most common side effects, give [place] a call and we can try something else.  Again, it was great to meet you both, and I wish you both the best. Dr. Melburn Hake

## 2015-03-29 NOTE — Assessment & Plan Note (Signed)
As mentioned in the history of present illness, he presented to the emergency department on 1127 with painful vesicles in the V2 trigeminal nerve distribution. He was subsequently diagnosed with zoster which I feel was certainly appropriate. He endorsed vertigo, but did not have any changes in hearing loss suggestive of Ramsay Hunt syndrome. There was no nasal tip or ocular involvement suggestive of nasociliary nerve infection. The zoster incidentally began 3 days after starting lamotrigine, so lamotrigine was stopped. There was never any mucosal involvement, or signs suggestive of Stevens-Johnson syndrome. He was treated with 7 days of Valtrex and 5 days of prednisone, and his rash has completely resolved, solidify the diagnosis of zoster. I advised he and his wife to give [place] a call if this recurs, and we are happy to prescribe more Valtrex if necessary.
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
## 2015-03-29 NOTE — Assessment & Plan Note (Signed)
He had a lumbar compression fracture in July of this year from a ground-level fall. He does not have a DEXA scan on file, but he and his wife are interested in starting alendronate, which I prescribed today. They were concerned this may worsen his nephrolithiasis, but I reassured them this decreases urinary calcium excretion, and in some studies has actually shown to prevent the recurrence of nephrolithiasis. They're very cost conscious, but [employer] sells this for $9 per month. I advised them on the side effects of this medication such as headache, nausea, and abdominal pain.

## 2015-04-02 NOTE — Progress Notes (Signed)
Internal Medicine Clinic Attending  I saw and evaluated the patient.  I personally confirmed the key portions of the history and exam documented by Dr. Flores and I reviewed pertinent patient test results.  The assessment, diagnosis, and plan were formulated together and I agree with the documentation in the resident's note.  

## 2015-04-02 NOTE — Addendum Note (Signed)
Addended by: Lalla Brothers T on: 04/02/2015 11:40 AM   Modules accepted: Level of Service

## 2015-05-29 ENCOUNTER — Telehealth: Payer: Self-pay | Admitting: Internal Medicine

## 2015-05-29 NOTE — Telephone Encounter (Signed)
   Reason for call:   I received a call from [name]'s wife at 8:45 PM regarding left red eye that started today when he woke up from sleep.   Pertinent Data:   She reports that his left eye has a very deep red color and extends from the tip of his cornea to the bridge of his nose without eye pain, vision change, photophobia, nausea, vomiting, or headache (worse than normal). She does report he has been sneezing for the past few days. His blood pressure is normal and is not currently on any anti-hypertensives. He is on aspirin and plavix but these are not new medications.     Assessment / Plan / Recommendations:   Based on pt's description, he most likely has subconjunctival hemorrhage from recent sneezing in the setting of blood thinner use. He has an upcoming appointment with his PCP on 08/16/15. I advised for her to take him to the ED if he should develop any alarm symptoms which would be an opthalmological emergency. She stated she understood and would make sure to do so.      Juluis Mire, MD   05/29/2015, 9:12 PM

## 2015-05-29 NOTE — Telephone Encounter (Signed)
Tried to call [name] [phone] back, no VM, phone picked up and then clicked.

## 2015-05-29 NOTE — Telephone Encounter (Signed)
[name] eye is red and concerned because this is his good eye left.  She wants to make sure he does not need to be seen.  Please advise.

## 2015-06-18 ENCOUNTER — Ambulatory Visit (INDEPENDENT_AMBULATORY_CARE_PROVIDER_SITE_OTHER): Payer: PPO | Admitting: Internal Medicine

## 2015-06-18 ENCOUNTER — Telehealth: Payer: Self-pay | Admitting: Internal Medicine

## 2015-06-18 VITALS — BP 115/79 | HR 98 | Temp 98.1°F | Ht 71.0 in | Wt 178.9 lb

## 2015-06-18 DIAGNOSIS — I2510 Atherosclerotic heart disease of native coronary artery without angina pectoris: Secondary | ICD-10-CM

## 2015-06-18 DIAGNOSIS — Z87311 Personal history of (healed) other pathological fracture: Secondary | ICD-10-CM

## 2015-06-18 DIAGNOSIS — Z299 Encounter for prophylactic measures, unspecified: Secondary | ICD-10-CM

## 2015-06-18 DIAGNOSIS — M545 Low back pain: Secondary | ICD-10-CM

## 2015-06-18 DIAGNOSIS — Z23 Encounter for immunization: Secondary | ICD-10-CM

## 2015-06-18 DIAGNOSIS — S32040D Wedge compression fracture of fourth lumbar vertebra, subsequent encounter for fracture with routine healing: Secondary | ICD-10-CM

## 2015-06-18 DIAGNOSIS — Z79899 Other long term (current) drug therapy: Secondary | ICD-10-CM

## 2015-06-18 DIAGNOSIS — R569 Unspecified convulsions: Secondary | ICD-10-CM | POA: Diagnosis not present

## 2015-06-18 DIAGNOSIS — F322 Major depressive disorder, single episode, severe without psychotic features: Secondary | ICD-10-CM

## 2015-06-18 DIAGNOSIS — Z7982 Long term (current) use of aspirin: Secondary | ICD-10-CM

## 2015-06-18 MED ORDER — CLOPIDOGREL BISULFATE 75 MG PO TABS: 75.0000 mg | ORAL_TABLET | Freq: Every morning | ORAL | Status: DC

## 2015-06-18 NOTE — Assessment & Plan Note (Signed)
-   No recurrent anginal symptoms - Will c/w asa, plavix, atorvastatin and imdur - Patient unable to tolerate beta blockers.

## 2015-06-18 NOTE — Telephone Encounter (Signed)
   Reason for call:   I received a call from [name]'s wife at 10.30 PM indicating that's patient had a fever of 101.5 earlier this evening. She gave patient fluids to drink. And she rechecked it an hour later, it had dropped to 100.5. Wife wanted to know if this is related to the immunization patient had this morning.  Patients wife denies patient has increased cough, shortness of breath, abdominal pain, vomiting, diarrhea, dysuria, confusion. He is otherwise at his baseline. Pt has no rash on his body or around the injection site.   Pertinent Data:   Patient was in clinic today for routine follow up. He received the 23 valent vaccine today.  Patients temperature dropped, without any antipyretics given.   Assessment / Plan / Recommendations:   Possibly fever is related to Pneumonia- 23 valent vaccine given today, per UTD, patients can have a fever up to 102, frequency not specified. He otherwise has no other symptom that would point to infection as an etiology. For now told pts wife, to recheck temp in 1 hr, that if fever returns or increases, or additional symptoms come up which I listed, he might need evaluation in the ED. Also told her fever can occur after immunization. If she is still concerned tomorrow she can bring him to clinic tomorrow.  Patient has no symptoms to suggest an allergic reaction. Likely just an immune response to vaccine.  Pts wife wanted me to let PCP know, will route message to PCP.  As always, pt is advised that if symptoms worsen or new symptoms arise, they should go to an urgent care facility or to to ER for further evaluation.   [name], MD   06/18/2015, 10:47 PM

## 2015-06-18 NOTE — Assessment & Plan Note (Signed)
-   depression is much improved. Mood is stable on current meds - Will c/w low dose of celexa for now (30 mg) - discussed with patient and wife who are in agreement

## 2015-06-18 NOTE — Progress Notes (Signed)
   Subjective:    Patient ID: Todd [name], male    DOB: 25-May-1945, 70 y.o.   MRN: [id number]  HPI Patient seen and examined. He is here for routine follow up of his chronic back pain and his his depression. Patient feels well today. He denies any new complaints. Wife states she has noticed mildly decreased appetite but no other complaints. His cognition has been good over the last 3 months. 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 His pain is very well controlled on current medication.   Review of Systems  Constitutional: Negative.   HENT: Negative.   Eyes: Negative.   Respiratory: Negative.   Cardiovascular: Negative.   Gastrointestinal: Negative.   Musculoskeletal: Positive for back pain. Negative for myalgias, joint swelling and arthralgias.  Skin: Negative.   Neurological: Negative for dizziness, seizures, syncope, light-headedness and headaches.  Psychiatric/Behavioral: Negative.        Objective:   Physical Exam  Constitutional: He is oriented to person, place, and time. He appears well-developed and well-nourished.  HENT:  Head: Normocephalic and atraumatic.  Cardiovascular: Normal rate, regular rhythm and normal heart sounds.   Pulmonary/Chest: Effort normal and breath sounds normal.  Abdominal: Soft. Bowel sounds are normal. He exhibits no distension. There is no tenderness.  Musculoskeletal: He exhibits no edema or tenderness.  Neurological: He is alert and oriented to person, place, and time.  Skin: Skin is warm and dry.  Psychiatric: He has a normal mood and affect. His behavior is normal.          Assessment & Plan:  Please see problem based charting for assessment and plan:

## 2015-06-18 NOTE — Assessment & Plan Note (Signed)
-   Patient has been off lamictal with no recurrent seizure activity - Patient was noted to have abnormal spikes on EEG on follow up with neurology - Discussed with patient and wife in detail- they need to f/u with Dr. Delice Lesch to see if they should resume lamictal

## 2015-06-18 NOTE — Assessment & Plan Note (Signed)
-   Back pain well controlled on current regimen.  - given his history of multiple compression fractures he was started on alendronate for likely osteoporosis - Wife is in agreement - Patient obtains his pain medication from [place]

## 2015-06-18 NOTE — Patient Instructions (Signed)
-   It was a pleasure seeing you today - I have refilled your plavix - Please follow up with Dr. Delice Lesch for your history of seizures - We will continue you on your current medications - If you continue to have decreased appetite please follow up with me - Please send me a copy of your lab work from the [place] when you get it

## 2015-06-18 NOTE — Assessment & Plan Note (Signed)
-   Pneumonia vaccine given today (23 valent)

## 2015-06-19 ENCOUNTER — Telehealth: Payer: Self-pay | Admitting: Internal Medicine

## 2015-06-19 NOTE — Telephone Encounter (Signed)
I spoke with the wife of Todd [name]. She states that he had a fever last night which resolved by itself and also has been weak today. I explained that the fever might be a reaction to getting the pneumonia vaccine. She is concerned about the weakness today. States she has difficulty getting him to stand but he has been eating and drinking appropriately and no further fevers. I asked her to bring him in tomorrow to [facility] if his symptoms persist. She is in agreement

## 2015-06-19 NOTE — Telephone Encounter (Signed)
   Reason for call:   I received a call from [name]' wife at 12:08  PM indicating he is weaker than usual.  Symptoms started yesterday evening around 5PM after they returned home from [facility] visit.  He complained of back pain and she had difficulty getting up him from the chair.  She had to call EMS to help get him up.  Today, she is unable to get him up even with the CNA's help.     Pertinent Data:   He has chronic low back pain and ambulates with a walker a baseline.  He fell yesterday AM and landed stomach first onto his walker.    He was fine after the fall and did not complain of pain or have increased difficulty walking.  She is not sure if they reported the fall to PCP during the [facility] visit.  He had a fever last night and called and spoke to on-call provider.  The fever has resolved and may have been due to flu vaccine given at yesterday's visit.  The patient is incontinent of bowel and bladder at baseline.  There has been no change in behavior or mentation, he is breathing well, no fever.  She says his sensation is normal, he has normal grip, and strength is ok when he is seated.  She has not given him any pain medication today.   Assessment / Plan / Recommendations:   I advised she come in to clinic so he can be evaluated for increased LE weakness.  She is not sure how she will get him there.  I asked her to assess his pain and treat pain if present.  I will have RN call early this afternoon to see if symptoms resolved.  If not, he needs to come in for acute visit.  She will need to use ambulance if unable to transport via private vehicle.  As always, pt is advised that if symptoms worsen or new symptoms arise, they should go to an urgent care facility or to to ER for further evaluation.   Francesca Oman, DO   06/19/2015, 12:58 PM

## 2015-06-19 NOTE — Telephone Encounter (Signed)
Pt wife called, she wanted to let us know that she and the aid have gotten patient up to wheelchair and he is eating.  Weakness has somewhat improved.  They believe increased pain just started last night from falling over his walker Monday.  She will monitor patient and call back with any additional needs.  Pt wife is aware that if symptoms worsen or new symptoms arise, they should go to an urgent care facility or to to ER for further evaluation

## 2015-07-02 ENCOUNTER — Telehealth: Payer: Self-pay | Admitting: Neurology

## 2015-07-02 NOTE — Telephone Encounter (Signed)
She wanted to know that since he has gotten over shingles if you wanted him to start the lamictal again.

## 2015-07-02 NOTE — Telephone Encounter (Signed)
PT's wife [name] called in regards to his medication Limictal and would like a call back at [phone] or [phone]

## 2015-07-02 NOTE — Telephone Encounter (Signed)
Yes would restart lamictal again, but he needs to slowly increase again: Lamictal 25mg  BID x 2 weeks, then 50mg  BID and continue until his f/u. Thanks

## 2015-07-03 NOTE — Telephone Encounter (Signed)
I spoke with patients wife again. Notified her of instructions for patient to start Lamictal again as directions states below. He is scheduled for a f/u on 3/28.

## 2015-07-23 ENCOUNTER — Encounter: Payer: Self-pay | Admitting: Neurology

## 2015-07-23 ENCOUNTER — Ambulatory Visit (INDEPENDENT_AMBULATORY_CARE_PROVIDER_SITE_OTHER): Payer: PPO | Admitting: Neurology

## 2015-07-23 VITALS — BP 116/82 | HR 64 | Resp 18

## 2015-07-23 DIAGNOSIS — G40109 Localization-related (focal) (partial) symptomatic epilepsy and epileptic syndromes with simple partial seizures, not intractable, without status epilepticus: Secondary | ICD-10-CM

## 2015-07-23 DIAGNOSIS — S0291XS Unspecified fracture of skull, sequela: Secondary | ICD-10-CM
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
## 2015-07-24 ENCOUNTER — Encounter: Payer: Self-pay | Admitting: Neurology

## 2015-07-24 DIAGNOSIS — G40109 Localization-related (focal) (partial) symptomatic epilepsy and epileptic syndromes with simple partial seizures, not intractable, without status epilepticus: Secondary | ICD-10-CM | POA: Insufficient documentation

## 2015-08-19 ENCOUNTER — Telehealth: Payer: Self-pay

## 2015-08-19 NOTE — Telephone Encounter (Signed)
Per dr Dareen Piano called pt's caregiver/ spouse, made appt for wed pm dr gill

## 2015-08-19 NOTE — Telephone Encounter (Signed)
Please ask patient to come in for an appointment. Thank you

## 2015-08-21 ENCOUNTER — Ambulatory Visit (INDEPENDENT_AMBULATORY_CARE_PROVIDER_SITE_OTHER): Payer: PPO | Admitting: Internal Medicine

## 2015-08-21 ENCOUNTER — Encounter: Payer: Self-pay | Admitting: Internal Medicine

## 2015-08-21 VITALS — BP 94/64 | HR 81 | Temp 97.7°F | Ht 71.0 in | Wt 174.4 lb

## 2015-08-21 DIAGNOSIS — J309 Allergic rhinitis, unspecified: Secondary | ICD-10-CM

## 2015-08-21 MED ORDER — SALINE SPRAY 0.65 % NA SOLN: 1.0000 | NASAL | Status: DC | PRN

## 2015-08-21 MED ORDER — CETIRIZINE HCL 10 MG PO TABS: 10.0000 mg | ORAL_TABLET | Freq: Every day | ORAL | Status: DC

## 2015-08-21 MED ORDER — HYDROCOD POLST-CPM POLST ER 10-8 MG/5ML PO SUER: 5.0000 mL | Freq: Two times a day (BID) | ORAL | Status: DC | PRN

## 2015-08-21 NOTE — Assessment & Plan Note (Addendum)
Pt with symptoms of allergies including sneezing, rhinorrhea, watery/ictchy eyes, and cough.  She has been using zyrtec and a steroid nasal spray without much relief.  She is also concerned that he is not able to clear his secretions and has experienced a lot of cough recently.  On exam, VSS although BP is a little soft but not much difference from previous values and does not appear septic.  Lungs are clear.  She has tried mucinex for cough but that hasn't helped and she is requesting "something stronger" for Mr. Frankey Poot.   -will try nasal saline spray several times daily -will cont current meds -will also provide tussionex for cough per wife's request

## 2015-08-21 NOTE — Patient Instructions (Signed)
Thank you for your visit today.   Please return to the internal medicine clinic at your regular appointment to see Dr. Dareen Piano.     I have made the following additions/changes to your medications:  Please use zyrtec daily as needed for allergies. Also try to use some nasal saline spray several times daily. I have also given you a prescription for tussionex for cough.   Please be sure to bring all of your medications with you to every visit; this includes herbal supplements, vitamins, eye drops, and any over-the-counter medications.   Should you have any questions regarding your medications and/or any new or worsening symptoms, please be sure to call the clinic at [phone].   If you believe that you are suffering from a life threatening condition or one that may result in the loss of limb or function, then you should call 911 and proceed to the nearest Emergency Department.
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
## 2015-08-26 NOTE — Progress Notes (Signed)
Internal Medicine Clinic Attending  Case discussed with Dr. Gill soon after the resident saw the patient.  We reviewed the resident's history and exam and pertinent patient test results.  I agree with the assessment, diagnosis, and plan of care documented in the resident's note.  

## 2015-09-02 ENCOUNTER — Telehealth: Payer: Self-pay

## 2015-09-02 NOTE — Telephone Encounter (Signed)
Thank you Leigh. Dr. Marijean Bravo can assess him and change his medication off zyrtec tomorrow

## 2015-09-02 NOTE — Telephone Encounter (Signed)
Patients spouse calls to request a change to the medication prescribed at [facility] on 4/26.  Wife states zyrtec not covered with patient insurance and is requesting either clarinex or xyzal be printed and faxed to the [place] for distribution.  Also, wife concerned about patients continued state of "not feeling well," nonspecific complaints other than not being able to tell the difference between sinus headaches and his chronic headache and not being able to manage them well.  Also, she would like a chest x-ray to be sure his "chest is clear of infection."  Requesting appointment sooner than the upcoming PCP apt on 5/30.  Added to tomorrow at 315 with Dr. Marijean Bravo

## 2015-09-03 ENCOUNTER — Encounter: Payer: Self-pay | Admitting: Internal Medicine

## 2015-09-03 ENCOUNTER — Ambulatory Visit (INDEPENDENT_AMBULATORY_CARE_PROVIDER_SITE_OTHER): Payer: PPO | Admitting: Internal Medicine

## 2015-09-03 ENCOUNTER — Telehealth: Payer: Self-pay | Admitting: Family Medicine

## 2015-09-03 VITALS — BP 108/61 | HR 109 | Temp 97.7°F | Ht 71.0 in | Wt 172.6 lb

## 2015-09-03 DIAGNOSIS — J309 Allergic rhinitis, unspecified: Secondary | ICD-10-CM | POA: Diagnosis not present

## 2015-09-03 DIAGNOSIS — Z87820 Personal history of traumatic brain injury: Secondary | ICD-10-CM | POA: Diagnosis not present

## 2015-09-03 MED ORDER — KETOTIFEN FUMARATE 0.025 % OP SOLN: 1.0000 [drp] | Freq: Two times a day (BID) | OPHTHALMIC | Status: AC

## 2015-09-03 MED ORDER — HYDROCOD POLST-CPM POLST ER 10-8 MG/5ML PO SUER: 5.0000 mL | Freq: Two times a day (BID) | ORAL | Status: DC | PRN

## 2015-09-03 MED ORDER — LEVOCETIRIZINE DIHYDROCHLORIDE 2.5 MG/5ML PO SOLN: 2.5000 mg | Freq: Every evening | ORAL | Status: DC

## 2015-09-03 MED ORDER — DESLORATADINE 5 MG PO TABS: 5.0000 mg | ORAL_TABLET | Freq: Every day | ORAL | Status: DC

## 2015-09-03 MED ORDER — KETOTIFEN FUMARATE 0.025 % OP SOLN: 1.0000 [drp] | Freq: Two times a day (BID) | OPHTHALMIC | Status: DC

## 2015-09-03 MED ORDER — LAMOTRIGINE 25 MG PO TABS: ORAL_TABLET | ORAL | Status: DC

## 2015-09-03 NOTE — Progress Notes (Signed)
   Subjective:    Patient ID: Todd [name], male    DOB: [birth year], 70 y.o.   MRN: [id number]  HPI  Todd [name] is a 70 year old man with a past medical history of TBI (speech and motor impairment), CAD, and peptic ulcer diseasewho comes to the clinic today with his wife to discuss a chronic cough and his need for rehabilitation services. His wife is his primary caregiver and assisted with providing the patient's history. For the past month, the patient has had a "wet" cough but has been unable to produce any phlegm. She has also noticed he's had a runny nose and red eyes. He has complained of blurry vision. They have tried over the counter non-drowsy antihistamines (Claritin, Zyrtec, Allegra), which has not been effective.   She also feels that his last round of physical therapy with [facility] has been ineffective. [facility], she felt, was more effective since they provided him with more motivation and were "harder" on him. She thinks he would benefit from intensive rehabilitation services, including speech, OT, and PT for "around 20 days." Overall, she's noted he's been less functional than after his round of PT with [facility], and spent all day several days go in bed due to back pain, which he's been having more frequently.   Review of Systems  Constitutional: Negative for fever.  HENT: Positive for congestion and postnasal drip.   Eyes: Positive for redness and itching.  Respiratory: Positive for cough. Negative for shortness of breath.   Musculoskeletal: Positive for back pain and arthralgias.  Neurological: Positive for weakness and headaches. Negative for seizures.       Objective:   Physical Exam  Vitals reviewed. General: Sitting in wheelchair, smiling, NAD HEENT: Plentiful mucus in nostrils, injected conjunctiva, mildly erythematous tonsils without exudate Cardiovascular: RRR, no m/r/g Pulmonary: CTAB. Unlabored breathing Abdominal: Soft, NT/ND Neurological:  Monosyllabic speech. 4-5/5 strength in all extremities. Tongue midline. Right eye deviation     Assessment & Plan:   Please see problem based assessment and plan for details.

## 2015-09-03 NOTE — Assessment & Plan Note (Signed)
Assessment: Watery, itchy eyes with cough is consistent with an allergy attack. OTC allergy medication has been ineffective. We will attempt to control his cough with Tussionex, as Robitussin has been ineffective and he is allergic to codeine. He may also benefit from antihistamine eye drops and prescription non-drowsy antihistamines  Plan: - Tussionex solution - Ketotifen eye drops - Levocetirizine or Desloratadine, whichever is available.

## 2015-09-03 NOTE — Assessment & Plan Note (Signed)
A: Based on the history provided, it could be argued that intensive rehabilitation services, including speech therapy, occupational therapy, and physical therapy would be medically necessary since he has apparently regressed, according to the patient's wife. We will speak with our social worker to see what services would be available to this gentleman.  P: Social Work consult.

## 2015-09-03 NOTE — Patient Instructions (Addendum)
Todd [name],  It was great to meet you today.  For your cough and allergies, we are prescribing Tussionex and Xyzal. If Xyzal is unavailable, please use Clarinex. I suspect this will improve your cough and runny nose. For your blurry vision, I suspect this is due to allergies as well, for which we are prescribing antihistamine eye drops.  For your rehab needs, we have contacted our social worker to see what your options are for [facility].  Take care.

## 2015-09-03 NOTE — Telephone Encounter (Signed)
-----   Message from [facility] sent at 09/03/2015 12:18 PM EDT ----- PT's wife [name] called and would like a call back in regards to PT/Dawn CB# [phone]

## 2015-09-03 NOTE — Telephone Encounter (Signed)
Returned call. They need patients most recent ov note & Lamictal Rx faxed to his [place] doctor Dr. Herold Harms.  Fax: [phone]

## 2015-09-04 ENCOUNTER — Telehealth: Payer: Self-pay | Admitting: Licensed Clinical Social Worker

## 2015-09-04 NOTE — Progress Notes (Signed)
Internal Medicine Clinic Attending  I saw and evaluated the patient.  I personally confirmed the key portions of the history and exam documented by Dr. Ford and I reviewed pertinent patient test results.  The assessment, diagnosis, and plan were formulated together and I agree with the documentation in the resident's note. 

## 2015-09-04 NOTE — Addendum Note (Signed)
Addended by: Zetta Bills on: 09/04/2015 02:28 PM   Modules accepted: Orders

## 2015-09-04 NOTE — Telephone Encounter (Signed)
Todd [name] was referred to CSW as spouse is requesting assistance with short term SNF placement.  Spouse states insurance does not require a 3 night qualifying stay but will need to find an in-network facility.  Pt has had a previous stay at [facility], however, spouse was not impressed with the quality of therapy.  CSW has initiated FL-2 and will obtain listing of in-network SNF to provide to spouse.  Spouse to find facility able to accept.

## 2015-09-05 NOTE — Telephone Encounter (Signed)
Completed and signed FL-2 received.  CSW placed call to Ms. Stallings.  Spouse provided with in-network SNF's and Medicare guidelines.  CSW offered to provide names/address over the phone or mail listing.  Spouse requesting this working to provide information over the phone.  Spouse notified this worker will only be able to refer to 2 SNF's for admission.  If either unable to accept, spouse will need to proceed with finding an admitting SNF.  Spouse requesting referral to be sent to [facility].  Referrals faxed to both facilities, awaiting response.

## 2015-09-06 NOTE — Telephone Encounter (Signed)
CSW spoke with Ms. Todd [name], admission at [facility].  Agency has requested [facility] PT notes from [place] and voiced concern regarding the lack of recommendation from [place] for SNF.  [place] awaiting additional notes from [place] and will make determination.

## 2015-09-12 ENCOUNTER — Encounter: Payer: Self-pay | Admitting: Licensed Clinical Social Worker

## 2015-09-12 NOTE — Telephone Encounter (Signed)
CSW placed call to Ms. Stallings, pt's spouse to provide current update to SNF placement.  Family notified this worker has yet to receive call back from [facility] and no response from [facility].  Spouse provided with the latest information request from [facility].  CSW provided Ms. Stallings with the contact number for Ms. Lola, Admissions at [facility] and the main number to [facility].  Family aware this worker will be unable to send Fl-2 to any additional facilities but able to mail Fl-2 for family to obtain an accepting facility.  [name] thankful and will receive copy of FL-2.  Family aware this worker will assist with any additional information needed for SNF placement.

## 2015-09-16 NOTE — Telephone Encounter (Signed)
CSW received voicemail from Ms. Stallings.  Spouse voiced complaints that she had already told this CSW pt utilized VA benefits for [facility] PT/ST services.  Spouse states she will find placement and requires FL-2 to be mailed.  [name] requesting CSW to return call to confirm receipt of voicemail.  CSW placed call to Ms. Stallings.  CSW notified [name] FL-2 was placed in outgoing mail on 09/12/15 and message left earlier was not regarding skilled services, but personal care services through [place] as a benefit Mr. Allard could benefit from. [name] states "I will call you later" and disconnected the phone line.

## 2015-09-16 NOTE — Telephone Encounter (Signed)
CSW provided contact number to [facility] regarding home based and community services offered through the [place] including: respite care and homemaker/home health aid services.  Encouraged spouse to set up meeting with VA to confirm and complete all required forms and check eligibility. Contact # for [place]: [phone]

## 2015-09-17 ENCOUNTER — Other Ambulatory Visit: Payer: Self-pay | Admitting: Internal Medicine

## 2015-09-17 ENCOUNTER — Telehealth: Payer: Self-pay | Admitting: Licensed Clinical Social Worker

## 2015-09-17 DIAGNOSIS — Z87820 Personal history of traumatic brain injury: Secondary | ICD-10-CM

## 2015-09-17 NOTE — Telephone Encounter (Signed)
CSW returned call to Todd [name].  Spouse states she has spoke with [facility] and pt will need physicians order for community to SNF placement.  CSW informed spouse will send request to Dr. Marijean Bravo.  [name] requesting order to be faxed to both [facility] and to fax complete referral to [facility].  Once order received CSW will fax referrals/order.

## 2015-09-17 NOTE — Telephone Encounter (Signed)
Obtained order for Admit to SNF and Eval and Treat PT/OT/ST.  Orders faxed to [facility].  Complete referral faxed to [facility] per spouse request.

## 2015-09-18 NOTE — Telephone Encounter (Signed)
Todd [name] confirmed they received orders and referral and will work on Museum/gallery conservator today.

## 2015-09-24 ENCOUNTER — Ambulatory Visit: Payer: PPO | Admitting: Internal Medicine

## 2015-12-04 ENCOUNTER — Telehealth: Payer: Self-pay | Admitting: Neurology

## 2015-12-04 NOTE — Telephone Encounter (Signed)
Patient wife would like to speake to someone about her husband having Spells again need to know what to do or know if the medication needs to be adjusted please call [phone]

## 2015-12-05 ENCOUNTER — Telehealth: Payer: Self-pay

## 2015-12-05 NOTE — Telephone Encounter (Signed)
Left message for patient to call back  

## 2015-12-05 NOTE — Telephone Encounter (Signed)
Spoke with patient's wife, who describes her husband as having "spells" controlled by Lamictal, that are getting worse.  She states he had not had one in 6 months but in the last 2 weeks he has been having these "spells" frequently.  She wonders if all he needs is an adjustment in medication.  Please advise.  Thanks!

## 2015-12-06 ENCOUNTER — Other Ambulatory Visit: Payer: Self-pay | Admitting: Emergency Medicine

## 2015-12-06 MED ORDER — LAMOTRIGINE 25 MG PO TABS: ORAL_TABLET | ORAL | 11 refills | Status: DC

## 2015-12-06 NOTE — Telephone Encounter (Signed)
Spoke to patients wife. He has 50 mg Lamictal tablets and she has a pill cutter. She would like refill sent in to [facility]. [phone]. It apparently takes about 10 days to get medications usually so she would like a rush order. Wife states she'll call back in about 1 week after increasing the dose if it is not helping.

## 2015-12-06 NOTE — Telephone Encounter (Signed)
Please have wife increase Lamictal dose to 75mg  BID. I think he has the 25mg  tabs, pls have her give him 3 tabs BID. Thanks!

## 2015-12-18 ENCOUNTER — Telehealth: Payer: Self-pay | Admitting: Neurology

## 2015-12-18 NOTE — Telephone Encounter (Signed)
PT's wife [name] called and would like a call back in regards to PT/Dawn CB#[phone]

## 2015-12-19 ENCOUNTER — Other Ambulatory Visit: Payer: Self-pay | Admitting: *Deleted

## 2015-12-19 MED ORDER — LAMOTRIGINE 100 MG PO TABS: 100.0000 mg | ORAL_TABLET | Freq: Two times a day (BID) | ORAL | 3 refills | Status: DC

## 2015-12-19 NOTE — Telephone Encounter (Signed)
[name] wife informed and Rx will be faxed to the [place].  AttnEustaquio Maize [phone]

## 2015-12-19 NOTE — Telephone Encounter (Signed)
It's still a low dose, let's increase further to Lamictal 100mg  BID. She can finish off the 25mg  tablets she has by taking 4 tabs BID, then pls call in a new Rx for Lamictal 100mg  tab: 1 tab BID. Thanks!

## 2015-12-19 NOTE — Telephone Encounter (Signed)
[name] wife called and said that you increased his lamictal from 50mg  to 75mg  but seizures have not stopped.  Please advise.

## 2015-12-23 ENCOUNTER — Telehealth: Payer: Self-pay | Admitting: Neurology

## 2015-12-23 NOTE — Telephone Encounter (Signed)
Notified pt's wife of medication change as directed by Dr Delice Lesch.  Wife seemed very hesitant, stating that she has a hard time getting him to eat due to the medication.  Advised her to give medication after dinner.  Wife states that she will try for a few days and let us know if this is not working.

## 2015-12-23 NOTE — Telephone Encounter (Signed)
Todd [name] 01-Dec-2045. His wife [name] called regarding his medication Lamitctal. Her # is [phone]. Thank you

## 2015-12-23 NOTE — Telephone Encounter (Signed)
Patient wife Todd [name] is returning your call please call [phone]

## 2015-12-23 NOTE — Telephone Encounter (Signed)
Let's do a slower increase, 50mg  in AM, 75mg  in PM. Thanks

## 2015-12-23 NOTE — Telephone Encounter (Signed)
Per pt's wife she has given pt Lamictal 100mg  BID on Friday, 75mg  BID on Saturday and 50mg  BID on Sunday.  Wife states that he is doing better but unsure how she should continue giving medication since the medication is making him sleepy and "unable to function".

## 2016-01-07 ENCOUNTER — Encounter: Payer: Self-pay | Admitting: *Deleted

## 2016-01-20 ENCOUNTER — Emergency Department (HOSPITAL_COMMUNITY)
Admission: EM | Admit: 2016-01-20 | Discharge: 2016-01-20 | Disposition: A | Discharge status: Home / Self Care | Payer: PPO | Attending: Emergency Medicine | Admitting: Emergency Medicine

## 2016-01-20 ENCOUNTER — Encounter (HOSPITAL_COMMUNITY): Payer: Self-pay | Admitting: *Deleted

## 2016-01-20 ENCOUNTER — Emergency Department (HOSPITAL_COMMUNITY): Payer: PPO

## 2016-01-20 DIAGNOSIS — J449 Chronic obstructive pulmonary disease, unspecified: Secondary | ICD-10-CM | POA: Insufficient documentation

## 2016-01-20 DIAGNOSIS — R569 Unspecified convulsions: Secondary | ICD-10-CM | POA: Diagnosis present

## 2016-01-20 DIAGNOSIS — Z7982 Long term (current) use of aspirin: Secondary | ICD-10-CM | POA: Diagnosis not present

## 2016-01-20 DIAGNOSIS — Z79899 Other long term (current) drug therapy: Secondary | ICD-10-CM | POA: Insufficient documentation

## 2016-01-20 DIAGNOSIS — I2510 Atherosclerotic heart disease of native coronary artery without angina pectoris: Secondary | ICD-10-CM | POA: Diagnosis not present

## 2016-01-20 DIAGNOSIS — Z87891 Personal history of nicotine dependence: Secondary | ICD-10-CM | POA: Diagnosis not present

## 2016-01-20 DIAGNOSIS — Z7951 Long term (current) use of inhaled steroids: Secondary | ICD-10-CM | POA: Diagnosis not present

## 2016-01-20 DIAGNOSIS — N390 Urinary tract infection, site not specified: Secondary | ICD-10-CM | POA: Diagnosis not present

## 2016-01-20 DIAGNOSIS — Z85828 Personal history of other malignant neoplasm of skin: Secondary | ICD-10-CM | POA: Insufficient documentation

## 2016-01-20 DIAGNOSIS — G40909 Epilepsy, unspecified, not intractable, without status epilepticus: Secondary | ICD-10-CM | POA: Insufficient documentation

## 2016-01-20 LAB — URINALYSIS, ROUTINE W REFLEX MICROSCOPIC
BILIRUBIN URINE: NEGATIVE
Glucose, UA: NEGATIVE mg/dL
Hgb urine dipstick: NEGATIVE
Ketones, ur: NEGATIVE mg/dL
NITRITE: POSITIVE — AB
Protein, ur: NEGATIVE mg/dL
SPECIFIC GRAVITY, URINE: 1.026 (ref 1.005–1.030)
pH: 6 (ref 5.0–8.0)

## 2016-01-20 LAB — CBC WITH DIFFERENTIAL/PLATELET
Basophils Absolute: 0 10*3/uL (ref 0.0–0.1)
Basophils Relative: 0 %
Eosinophils Absolute: 0.2 10*3/uL (ref 0.0–0.7)
Eosinophils Relative: 1 %
HCT: 43.1 % (ref 39.0–52.0)
HEMOGLOBIN: 14 g/dL (ref 13.0–17.0)
LYMPHS ABS: 1.5 10*3/uL (ref 0.7–4.0)
Lymphocytes Relative: 9 %
MCH: 32.3 pg (ref 26.0–34.0)
MCHC: 32.5 g/dL (ref 30.0–36.0)
MCV: 99.5 fL (ref 78.0–100.0)
MONOS PCT: 8 %
Monocytes Absolute: 1.3 10*3/uL — ABNORMAL HIGH (ref 0.1–1.0)
NEUTROS ABS: 13.8 10*3/uL — AB (ref 1.7–7.7)
NEUTROS PCT: 82 %
PLATELETS: 245 10*3/uL (ref 150–400)
RBC: 4.33 MIL/uL (ref 4.22–5.81)
RDW: 13.6 % (ref 11.5–15.5)
WBC: 16.8 10*3/uL — ABNORMAL HIGH (ref 4.0–10.5)

## 2016-01-20 LAB — URINE MICROSCOPIC-ADD ON
RBC / HPF: NONE SEEN RBC/hpf (ref 0–5)
SQUAMOUS EPITHELIAL / LPF: NONE SEEN

## 2016-01-20 LAB — BASIC METABOLIC PANEL
ANION GAP: 10 (ref 5–15)
BUN: 24 mg/dL — ABNORMAL HIGH (ref 6–20)
CHLORIDE: 111 mmol/L (ref 101–111)
CO2: 22 mmol/L (ref 22–32)
Calcium: 9.2 mg/dL (ref 8.9–10.3)
Creatinine, Ser: 0.91 mg/dL (ref 0.61–1.24)
GFR calc non Af Amer: >60 mL/min (ref >60)
Glucose, Bld: 122 mg/dL — ABNORMAL HIGH (ref 65–99)
Potassium: 3.6 mmol/L (ref 3.5–5.1)
Sodium: 143 mmol/L (ref 135–145)

## 2016-01-20 LAB — CBG MONITORING, ED: Glucose-Capillary: 93 mg/dL (ref 65–99)

## 2016-01-20 MED ORDER — CEPHALEXIN 500 MG PO CAPS: 500.0000 mg | ORAL_CAPSULE | Freq: Four times a day (QID) | ORAL | 0 refills | Status: DC

## 2016-01-20 MED ORDER — DEXTROSE 5 % IV SOLN
1.0000 g | Freq: Once | INTRAVENOUS | Status: AC
  Administered 2016-01-20: 1 g via INTRAVENOUS
  Filled 2016-01-20: qty 10

## 2016-01-20 MED ORDER — SODIUM CHLORIDE 0.9 % IV SOLN
INTRAVENOUS | Status: DC
  Administered 2016-01-20: 12:00:00 via INTRAVENOUS
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
## 2016-01-20 NOTE — ED Notes (Signed)
CBG WAS 93

## 2016-01-20 NOTE — ED Notes (Signed)
Family at bedside.Wife.

## 2016-01-20 NOTE — ED Triage Notes (Signed)
Per EMS, pt from home, Home care aid reports she was assisting pt to ambulate with a walker, when his body became rigid and started to "stare."  Has hx of seizure and TBI.

## 2016-01-21 ENCOUNTER — Telehealth: Payer: Self-pay | Admitting: Neurology

## 2016-01-21 NOTE — Telephone Encounter (Signed)
PT's wife called in regards to PT having seizures/Dawn  CB#[phone] or [phone]

## 2016-01-22 LAB — URINE CULTURE: Culture: >=100000 — AB

## 2016-01-22 NOTE — Telephone Encounter (Signed)
Pls let her know I reviewed his ER visit, he was found to have a UTI. When someone has an infection, it can lower their threshold to have a seizure. Continue with treatment of UTI, continue on same dose of seizure medication for now, pls have him schedule an earlier appt with me. Thanks

## 2016-01-22 NOTE — Telephone Encounter (Signed)
Spoke with pt's wife. She states pt was in ED 01/20/16 for seizure. Pt is taking Lamictal 50mg  in AM and Lamictal 50mg  in Pm. Wife states pt cant take any more than that and wants to know if we can recommend anything else.

## 2016-01-23 ENCOUNTER — Telehealth (HOSPITAL_COMMUNITY): Payer: Self-pay

## 2016-01-23 NOTE — Telephone Encounter (Signed)
Post ED Visit - Positive Culture Follow-up  Culture report reviewed by antimicrobial stewardship pharmacist:  []  Elenor Quinones, Pharm.D. []  Heide Guile, Pharm.D., BCPS []  Parks Neptune, Pharm.D. []  Alycia Rossetti, Pharm.D., BCPS []  Leonard, [place].D., BCPS, AAHIVP []  Legrand Como, Pharm.D., BCPS, AAHIVP []  Milus Glazier, Pharm.D. []  Stephens November, [place].D. Shanon Rosser, Pharm.D.  Positive urine culture, 100,000 colonies -> E Coli Treated with Cephalexin, organism sensitive to the same and no further patient follow-up is required at this time.  Dortha Kern 01/23/2016, 9:42 AM

## 2016-01-24 NOTE — Telephone Encounter (Signed)
Pts wife notified, verbalized understanding.

## 2016-01-29 ENCOUNTER — Telehealth: Payer: Self-pay | Admitting: Internal Medicine

## 2016-01-29 NOTE — Telephone Encounter (Signed)
Pt wife's called and stated [facility] Nurse Alonna Minium who was in the home earlier  requested the patient to get a RX for [place].

## 2016-01-30 NOTE — Telephone Encounter (Signed)
His wife states it is thrush and states we got plain yogurt and it seems to be healing it, she was ask if he becomes worse or it does not clear to please call for an appt, she was agreeable

## 2016-01-30 NOTE — Telephone Encounter (Signed)
Can she bring him in for an appointment so we can verify this is thrush?

## 2016-02-04 ENCOUNTER — Telehealth: Payer: Self-pay | Admitting: Internal Medicine

## 2016-02-04 DIAGNOSIS — R296 Repeated falls: Secondary | ICD-10-CM

## 2016-02-04 NOTE — Telephone Encounter (Signed)
Put in the order

## 2016-02-04 NOTE — Telephone Encounter (Signed)
PT Todd [name]) calling to report a fall at home in the [place] and EMS came to assess and help the patient up.  Patient is sore on right hip.  DME Order for a 30 inch Slide Board would be useful to increase patient's safety at home.

## 2016-02-12 NOTE — Telephone Encounter (Signed)
DME Order was faxed to [facility]

## 2016-02-16 ENCOUNTER — Encounter (HOSPITAL_COMMUNITY): Payer: Self-pay | Admitting: Emergency Medicine

## 2016-02-16 ENCOUNTER — Emergency Department (HOSPITAL_COMMUNITY): Payer: PPO

## 2016-02-16 ENCOUNTER — Emergency Department (HOSPITAL_COMMUNITY)
Admission: EM | Admit: 2016-02-16 | Discharge: 2016-02-16 | Disposition: A | Discharge status: Home / Self Care | Payer: PPO | Attending: Emergency Medicine | Admitting: Emergency Medicine

## 2016-02-16 DIAGNOSIS — Z7982 Long term (current) use of aspirin: Secondary | ICD-10-CM | POA: Insufficient documentation

## 2016-02-16 DIAGNOSIS — Y999 Unspecified external cause status: Secondary | ICD-10-CM | POA: Insufficient documentation

## 2016-02-16 DIAGNOSIS — S20229A Contusion of unspecified back wall of thorax, initial encounter: Secondary | ICD-10-CM

## 2016-02-16 DIAGNOSIS — W19XXXA Unspecified fall, initial encounter: Secondary | ICD-10-CM

## 2016-02-16 DIAGNOSIS — I2510 Atherosclerotic heart disease of native coronary artery without angina pectoris: Secondary | ICD-10-CM | POA: Diagnosis not present

## 2016-02-16 DIAGNOSIS — Y939 Activity, unspecified: Secondary | ICD-10-CM | POA: Insufficient documentation

## 2016-02-16 DIAGNOSIS — Z87891 Personal history of nicotine dependence: Secondary | ICD-10-CM | POA: Insufficient documentation

## 2016-02-16 DIAGNOSIS — Y92009 Unspecified place in unspecified non-institutional (private) residence as the place of occurrence of the external cause: Secondary | ICD-10-CM | POA: Insufficient documentation

## 2016-02-16 DIAGNOSIS — J449 Chronic obstructive pulmonary disease, unspecified: Secondary | ICD-10-CM | POA: Diagnosis not present

## 2016-02-16 DIAGNOSIS — W1839XA Other fall on same level, initial encounter: Secondary | ICD-10-CM | POA: Insufficient documentation

## 2016-02-16 DIAGNOSIS — S199XXA Unspecified injury of neck, initial encounter: Secondary | ICD-10-CM | POA: Diagnosis present

## 2016-02-16 DIAGNOSIS — S161XXA Strain of muscle, fascia and tendon at neck level, initial encounter: Secondary | ICD-10-CM | POA: Insufficient documentation

## 2016-02-16 DIAGNOSIS — Z85828 Personal history of other malignant neoplasm of skin: Secondary | ICD-10-CM | POA: Insufficient documentation

## 2016-02-16 MED ORDER — TRAMADOL HCL 50 MG PO TABS: 50.0000 mg | ORAL_TABLET | Freq: Four times a day (QID) | ORAL | 0 refills | Status: DC | PRN
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
## 2016-02-16 NOTE — ED Provider Notes (Signed)
18:05- . I was asked by Dr. Vallery Ridge to evaluate the patient's imaging results, and discharge if there was no concerning findings. Dr. Vallery Ridge has previously done. Discharge instructions and written a prescription for pain relief.    Dg Chest 2 View  Result Date: 02/16/2016 CLINICAL DATA:  70 year old male with history of trauma from a fall backwards today. EXAM: CHEST  2 VIEW COMPARISON:  Chest x-ray 11/20/2014. FINDINGS: Lung volumes are normal. No consolidative airspace disease. No pleural effusions. No pneumothorax. No pulmonary nodule or mass noted. Pulmonary vasculature and the cardiomediastinal silhouette are within normal limits. Atherosclerosis in the thoracic aorta. Multiple old healed posterior right-sided rib fractures involving the right fourth, fifth, sixth, seventh and eighth ribs. IMPRESSION: 1. No radiographic evidence of acute cardiopulmonary disease. 2. Aortic atherosclerosis. 3. Multiple old healed right-sided posterior rib fractures are again noted. Electronically Signed   By: Vinnie Langton M.D.   On: 02/16/2016 17:23   Dg Thoracic Spine 2 View  Result Date: 02/16/2016 CLINICAL DATA:  Fall backwards today EXAM: THORACIC SPINE 2 VIEWS COMPARISON:  11/20/2014 FINDINGS: Three views of thoracic spine submitted. No acute fracture or subluxation. There is diffuse osteopenia. Mild dextroscoliosis. Multiple old right rib fractures are again noted. IMPRESSION: No acute fracture or subluxation. Mild dextroscoliosis. Multiple old right rib fractures. Electronically Signed   By: Lahoma Crocker M.D.   On: 02/16/2016 17:25   Ct Cervical Spine Wo Contrast  Result Date: 02/16/2016 CLINICAL DATA:  70 year old male with history of trauma from a fall today complaining of neck pain. EXAM: CT CERVICAL SPINE WITHOUT CONTRAST TECHNIQUE: Multidetector CT imaging of the cervical spine was performed without intravenous contrast. Multiplanar CT image reconstructions were also generated. COMPARISON:   Cervical spine CT scan 11/20/2014. FINDINGS: Alignment: Normal. Skull base and vertebrae: No acute fracture. No primary bone lesion or focal pathologic process. Soft tissues and spinal canal: No prevertebral fluid or swelling. No visible canal hematoma. Disc levels: Multilevel degenerative disc disease, most severe at C5-C6 and C6-C7. Mild multilevel facet arthropathy. Upper chest: Unremarkable. Other: None. IMPRESSION: 1. No evidence of significant acute traumatic injury to the cervical spine. 2. Multilevel degenerative disc disease and cervical spondylosis, as above, similar to the prior examination. Electronically Signed   By: Vinnie Langton M.D.   On: 02/16/2016 16:37     Daleen Bo, MD 02/16/16 [phone]

## 2016-02-16 NOTE — ED Notes (Signed)
Patient returned from radiology

## 2016-02-16 NOTE — ED Notes (Signed)
Patient transported to CT 

## 2016-02-16 NOTE — ED Triage Notes (Signed)
From home via EMS: Commonly falls due to old TBI and uses walker. About 30 minutes ago fell backwards due to loss of balance. No dizziness, no LOC. Hit left lower back on pointed edge of bathroom countertop. At baseline per wife. He reports back pain 5/10. VSS, CBG 109 in route.

## 2016-03-17 ENCOUNTER — Telehealth: Payer: Self-pay

## 2016-03-17 NOTE — Telephone Encounter (Signed)
Called and discussed with [facility] on call Dr. Nino Parsley.  The GI service does not have capacity to perform this colonoscopy this week (given the holiday schedule - Thanksgiving) but would be happy to assist in a non emergent colonoscopy between Thanksgiving and Christmas.    Plan: PCP or [facility] Attending to set up a direct admit with GI consult after the holiday, likely the last week in November.  Patients wife is also requesting evaluation for SNF placement, so a PT and OT evaluation would be indicated as well.

## 2016-03-17 NOTE — Telephone Encounter (Signed)
Wife called to report that patient needs to have colonoscopy was told that patient needs to be admitted to hospital so that he can take medications to get colon cleaned out can not be done at home because he is incontinent. He is also having severe back had to call EMS to get him out of car last night. He just finished PT with no results he seems to be getting worse. OT is to start today. She is requesting hospital admission and after colonoscopy that he be admitted to nursing home for 20 days to get PT needs admission to nursing home prior to end of the year because his insurance is going to change. Please advise

## 2016-03-17 NOTE — Telephone Encounter (Signed)
Discussed with Triage.  Would like to get some more information prior to proceeding with direct admit.  Will attempt to get GI notes, as this group told him to be admitted for colonoscopy.  Would ideally need to coordinate with that group about an appropriate time to move forward with colonoscopy so they will have availability to do the procedure inpatient.   This type of admission would be observation and likely only for 1 night.  I would like Shana to comment on whether his insurance provider would approve a SNF stay with this type of admission only.   Thanks!

## 2016-03-17 NOTE — Telephone Encounter (Signed)
RTC to wife inform her that Dr. Venia Minks said we will assist with hospital admission for colonoscopy on next week once admitted social workers can work on admission to [facility]

## 2016-03-17 NOTE — Telephone Encounter (Signed)
I am unable to confirm his current medical coverage as pt's EMR states coverage needs verified.  CSW unable to determine prior to admission if SNF would be covered as pt would require prior authorization. Pt has a Medicare Advantage plan, which differs from traditional Medicare SNF benefits. [employer] "care and treatment in a [facility] (SNF) is provided when medically necessary and approved through the prior authorization process by a [employer] delegated UM Department."

## 2016-03-17 NOTE — Telephone Encounter (Signed)
GI office reports they have not seen patient since 2010 his wife came by yesterday and spoke with Dr. Daisey Must and he told her it would probably be better if he was admitted. They requested copy of his last visit here I faxed it to them.

## 2016-03-23 ENCOUNTER — Telehealth: Payer: Self-pay | Admitting: *Deleted

## 2016-03-23 NOTE — Telephone Encounter (Signed)
Call to [facility] to schedule patient for Colonoscopy and to see when patient will need to be admitted for the Colonoscopy.  Spoke with office x 2 [place] said that pateint's wife had been contacted about an appointment and does not want to bring if not needed. Problem getting patient in and out of car.  Doctor at [facility] will need patient to come to the office for an appointment prior to scheduling the Colonoscopy.  Dr. Dareen Piano was informed of and will speak with patient's wife.  Sander Nephew, RN 03/23/2016 5:04 PM.

## 2016-03-25 ENCOUNTER — Telehealth: Payer: Self-pay | Admitting: *Deleted

## 2016-03-25 NOTE — Telephone Encounter (Signed)
RN Regino Schultze called [facility] who wanted him to come to the office for an appointment prior to inpatient admission for colonoscopy. RN spoke to wife who states they have an appointment with GI on Friday December 1st and will keep this appointment and will try for inpatient admission for colonoscopy next week if GI is agreeable. If admitted will need PT/OT eval for possible inpatient rehab admission after discharge from the hospital given worsening weakness.

## 2016-03-25 NOTE — Telephone Encounter (Signed)
Spoke with Judeen Hammans this am patient's Significant other today about the Colonoscopy.  Judeen Hammans said that she made an appointment on yesterday for 03/27/2016 at 4:30 to arrive by 4:15 PM.  Questions about how patient will be able to do the prep.  Suggested to her to discuss with the GI doctor at the visit.  Patient to be admitted prior to procedure by Dr. Dareen Piano when date is determined.  Judeen Hammans also had questions about having patient admitted to [facility]  for 20 days to do intensive Rehab for patient.  Judeen Hammans was informed that I would speak to Dr. Dareen Piano (PCP) about what will need to be done.  Spoke with Dr. Dareen Piano about.  To discuss with patient and Judeen Hammans during admission for Colonoscopy.  Sander Nephew, RN 03/25/2016 10:18 AM

## 2016-04-08 ENCOUNTER — Telehealth: Payer: Self-pay | Admitting: *Deleted

## 2016-04-08 NOTE — Telephone Encounter (Addendum)
RTC to [facility] patient's significant other about Colonoscopy.  Patient is to be admitted for the procedure by Dr. Dareen Piano.  Call was to see if patient has been given a date for the procedure.  Call to Home phone and cell numbers.  Message left on Cell phone voice mail.  Sander Nephew, RN 04/08/2016 9:54 AM.  RTC from [place] who said that the GI office wants patient to do the Color Guard test first before scheduling a Colonoscopy if needed.  Judeen Hammans is awaiting the delivery of the test and would  like to do the testing while patient is at [facility].  Pt's wife will call the Clinics after Christmas to get order for patient's admission from Dr. Katherene Ponto if possible and she will let us know if the Color Guard Test Kit has come in.  Sander Nephew, RN 04/08/2016 10:19 AM

## 2016-04-08 NOTE — Telephone Encounter (Signed)
I am off inpatient after Friday the 15th but the inpatient team on can admit him if he needs to be admitted. If his test is negative I don't think he will need a colonoscopy

## 2016-04-13 ENCOUNTER — Telehealth: Payer: Self-pay | Admitting: Licensed Clinical Social Worker

## 2016-04-13 NOTE — Telephone Encounter (Signed)
FL2 initiated in specialty flowsheet pended until pt's appt 04/14/16

## 2016-04-13 NOTE — NC FL2 (Signed)
[place] LEVEL OF CARE SCREENING TOOL     IDENTIFICATION  Patient Name: Todd [name] Birthdate: May 20, 1945 Sex: male Admission Date (Current Location): (Not on file)  [place] and [place] Number:  Herbalist and Address:         Provider Number:    Attending Physician Name and Address:  No att. providers found  Relative Name and Phone Number:  [name] [phone]    Current Level of Care: Home Recommended Level of Care: [place] Prior Approval Number:    Date Approved/Denied:   PASRR Number:    Discharge Plan: SNF    Current Diagnoses: Patient Active Problem List   Diagnosis Date Noted  . Allergic rhinitis 08/21/2015  . 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 Herpes zoster 03/29/2015  . Convulsion ([place]) 01/16/2015  . Awareness alteration, transient 01/16/2015  . 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 Compression fracture of L2 lumbar vertebra ([place]) 11/20/2014  . Compression fracture of L4 lumbar vertebra ([place]) 11/20/2014  . Insomnia 11/02/2014  . CAD in native artery 10/24/2014  . Coronary artery calcification seen on CAT scan 10/24/2014  . Deviated septum 08/01/2014  . Dysphagia 05/18/2013  . Chronic pulmonary aspiration 05/16/2013  . Abnormality of gait 05/01/2013  . Unspecified constipation 12/07/2012  . Chronic airway obstruction, not elsewhere classified 08/17/2012  . Coronary atherosclerosis of native coronary artery 08/17/2012  . Generalized weakness 04/09/2012  . Osteoporosis with fracture 03/05/2011  . Preventive measure 01/26/2011  . Hyperlipidemia 04/07/2010  . Major depressive disorder, single episode, severe ([place]) 11/13/2009  . Personal history of traumatic brain injury 06/17/2009  . LOSS, HEARING NOS 11/26/2006  .  Aphasia 11/26/2006  . HEPATITIS C 08/10/2006  . PEPTIC ULCER DISEASE 08/10/2006  . Urinary incontinence 08/10/2006    Orientation RESPIRATION BLADDER Height & Weight     Self  Normal Incontinent Weight:   Height:     BEHAVIORAL SYMPTOMS/MOOD NEUROLOGICAL BOWEL NUTRITION STATUS      Incontinent Diet (mechanical soft, Nectar thick liquids)  AMBULATORY STATUS COMMUNICATION OF NEEDS Skin   Extensive Assist Non-Verbally Normal                       Personal Care Assistance Level of Assistance  Total care Bathing Assistance: Maximum assistance Feeding assistance: Maximum assistance Dressing Assistance: Maximum assistance Total Care Assistance: Maximum assistance   Functional Limitations Info  Speech, Hearing, Sight Sight Info: Adequate Hearing Info: Adequate Speech Info: Impaired    SPECIAL CARE FACTORS FREQUENCY  PT (By licensed PT), OT (By licensed OT), Speech therapy                    Contractures      Additional Factors Info  Allergies   Allergies Info:  (Acetaminophen, Naproxen, Beta Adrenergic Blockers, Codeine, Metoprolol)           Current Medications (04/13/2016):  This is the current hospital active medication list Current Outpatient Prescriptions  Medication Sig Dispense Refill  . albuterol (PROAIR HFA) 108 (90 BASE) MCG/ACT inhaler inhale 2 puffs INTO THE LUNGS every 6 hours if needed for wheezing 18 g 3  . alendronate (FOSAMAX) 70 MG tablet Take 1 tablet (70 mg total) by mouth once a week. Take with a full glass of water on an empty stomach. 12 tablet 3  .  Ascorbic Acid (VITAMIN C) 1000 MG tablet Take 1,000 mg by mouth daily.    Marland Kitchen aspirin 81 MG chewable tablet Chew 1 tablet (81 mg total) by mouth daily. 30 tablet 2  . atorvastatin (LIPITOR) 80 MG tablet Take 1 tablet (80 mg total) by mouth daily at 6 PM. 30 tablet 2  . B Complex Vitamins (VITAMIN B COMPLEX PO) Take 1 tablet by mouth daily.    Marland Kitchen BIOTIN PO Take 1 tablet by mouth daily.    .  budesonide-formoterol (SYMBICORT) 160-4.5 MCG/ACT inhaler Inhale 2 puffs into the lungs 2 (two) times daily. 1 Inhaler 12  . cephALEXin (KEFLEX) 500 MG capsule Take 1 capsule (500 mg total) by mouth 4 (four) times daily. 28 capsule 0  . cetirizine (ZYRTEC) 10 MG tablet Take 1 tablet (10 mg total) by mouth daily. (Patient not taking: Reported on 01/20/2016) 30 tablet 1  . chlorhexidine (PERIDEX) 0.12 % solution Use as directed 15 mLs in the mouth or throat daily.    . chlorpheniramine-HYDROcodone (TUSSIONEX PENNKINETIC ER) 10-8 MG/5ML SUER Take 5 mLs by mouth every 12 (twelve) hours as needed for cough. (Patient not taking: Reported on 01/20/2016) 140 mL 0  . Cholecalciferol (VITAMIN D) 2000 units tablet Take 2,000 Units by mouth daily.    . clopidogrel (PLAVIX) 75 MG tablet Take 1 tablet (75 mg total) by mouth every morning. 30 tablet 11  . Cyanocobalamin (VITAMIN B 12 PO) Take 1 tablet by mouth daily.    Marland Kitchen desloratadine (CLARINEX) 5 MG tablet Take 1 tablet (5 mg total) by mouth daily. (Patient not taking: Reported on 01/20/2016) 30 tablet 2  . desonide (DESOWEN) 0.05 % cream Apply 1 application topically 2 (two) times daily.    . famotidine (PEPCID) 20 MG tablet Take 20 mg by mouth daily.    . Flaxseed, Linseed, (FLAX SEED OIL) 1300 MG CAPS Take 1 tablet by mouth daily.    . fluticasone (FLONASE) 50 MCG/ACT nasal spray instill 1 spray into each nostril once daily 16 g 2  . guaifenesin (HUMIBID E) 400 MG TABS tablet Take 400 mg by mouth 2 (two) times daily. Take every day per wife    . isosorbide mononitrate (IMDUR) 30 MG 24 hr tablet Take 1 tablet (30 mg total) by mouth daily. 30 tablet 2  . ketotifen ([employer] ANTIHISTAMINE EYE DROPS) 0.025 % ophthalmic solution Place 1 drop into both eyes 2 (two) times daily. 5 mL 5  . lamoTRIgine (LAMICTAL) 100 MG tablet Take 1 tablet (100 mg total) by mouth 2 (two) times daily. (Patient taking differently: Take 50 mg by mouth 2 (two) times daily. ) 60 tablet 3  .  lamoTRIgine (LAMICTAL) 25 MG tablet 3 tablets twice a day (Patient not taking: Reported on 01/20/2016) 150 tablet 11  . levocetirizine (XYZAL) 2.5 MG/5ML solution Take 5 mLs (2.5 mg total) by mouth every evening. (Patient not taking: Reported on 01/20/2016) 148 mL 12  . levocetirizine (XYZAL) 5 MG tablet Take 5 mg by mouth every evening.    . lidocaine (LIDODERM) 5 % Place 1 patch onto the skin daily. Remove & Discard patch within 12 hours or as directed by MD    . Multiple Vitamin (MULTIVITAMIN WITH MINERALS) TABS tablet Take 1 tablet by mouth every morning.    Marland Kitchen oxyCODONE (OXY IR/ROXICODONE) 5 MG immediate release tablet take 1 tablet by mouth twice a day if needed for severe pain  0  . sodium chloride (OCEAN) 0.65 % SOLN nasal spray Place  1 spray into both nostrils as needed for congestion. 1 Bottle 0  . traMADol (ULTRAM) 50 MG tablet Take 1 tablet (50 mg total) by mouth every 6 (six) hours as needed. 15 tablet 0   No current facility-administered medications for this visit.      Discharge Medications: Please see discharge summary for a list of discharge medications.  Relevant Imaging Results:  Relevant Lab Results:   Additional Information    Willow Ora, LCSW

## 2016-04-13 NOTE — Telephone Encounter (Signed)
Mr. Allemang was referred to CSW as pt's SO requesting admission to SNF/[facility] from community.  CSW placed call to [facility] to inquire if SO had been in contact with SNF for pre-cert with insurance company.  Message left with admissions coordinator.  CSW placed call to Ms. Samuella Cota   [name] states Mr. Overman is in need of additional therapies, in addition to caregiver needing respite.  [name] states pt is currently receiving [facility]/OT through [facility] and therapy has recommending SNF therapy, notes have been sent to [facility]. CSW encouraged Ms. Stallings to contact [facility] and have PT/OT notes sent to [facility] for pre-cert and inquire if SNF will accept chest xray from Oct 2017.  CSW will initiate FL2 as pt will need updated FL2 for admission.  Pt is scheduled for appointment on 04/14/16.  Facility has returned call to CSW requesting updated FL2 to be faxed to [phone] along with recent notes.

## 2016-04-14 ENCOUNTER — Ambulatory Visit (INDEPENDENT_AMBULATORY_CARE_PROVIDER_SITE_OTHER): Payer: PPO | Admitting: Internal Medicine

## 2016-04-14 DIAGNOSIS — L723 Sebaceous cyst: Secondary | ICD-10-CM | POA: Diagnosis not present

## 2016-04-14 DIAGNOSIS — M8000XD Age-related osteoporosis with current pathological fracture, unspecified site, subsequent encounter for fracture with routine healing: Secondary | ICD-10-CM

## 2016-04-14 DIAGNOSIS — Z87820 Personal history of traumatic brain injury: Secondary | ICD-10-CM

## 2016-04-14 DIAGNOSIS — I2510 Atherosclerotic heart disease of native coronary artery without angina pectoris: Secondary | ICD-10-CM

## 2016-04-14 DIAGNOSIS — M8008XD Age-related osteoporosis with current pathological fracture, vertebra(e), subsequent encounter for fracture with routine healing: Secondary | ICD-10-CM

## 2016-04-14 DIAGNOSIS — G40109 Localization-related (focal) (partial) symptomatic epilepsy and epileptic syndromes with simple partial seizures, not intractable, without status epilepticus: Secondary | ICD-10-CM

## 2016-04-14 DIAGNOSIS — Z299 Encounter for prophylactic measures, unspecified: Secondary | ICD-10-CM

## 2016-04-14 DIAGNOSIS — Z7982 Long term (current) use of aspirin: Secondary | ICD-10-CM

## 2016-04-14 DIAGNOSIS — Z79899 Other long term (current) drug therapy: Secondary | ICD-10-CM

## 2016-04-14 DIAGNOSIS — Z87891 Personal history of nicotine dependence: Secondary | ICD-10-CM

## 2016-04-14 NOTE — Assessment & Plan Note (Signed)
-   Patient to get Col[place] guard from GI  - If positive test will need colonoscopy done as inpatient as he would be unable to do prep at home - Will follow up results

## 2016-04-14 NOTE — Telephone Encounter (Signed)
[place] FL2 updated and printed for signature.

## 2016-04-14 NOTE — Patient Instructions (Signed)
-   It was a pleasure seeing you today - Have a great holiday season - We will attempt to place him for short term rehab - No change in medications today - No blood work today - Please follow up in 3 months - WIll refer him to dermatology for sebaceous cyst removal

## 2016-04-14 NOTE — Assessment & Plan Note (Signed)
-   compliant with fosamax - Patient on vitamin D but has not been taking his calcium - Discussed with patient and wife in detail - patient to resume oral calcium

## 2016-04-14 NOTE — Assessment & Plan Note (Signed)
-   patient with no anginal symptoms  - We will continue with asa, plavix, imdur for now - Will avoid beta blockers as he has had recurrent falls with this

## 2016-04-14 NOTE — Assessment & Plan Note (Signed)
-   Patient with recurrent sebaceous cyst over his back - Had a previous one excised by Dr. Allyson Sabal - Per wife patient will need new dermatologist as Dr. Allyson Sabal is unable to do this now secondary to billing issues - Will refer to derm

## 2016-04-14 NOTE — Assessment & Plan Note (Signed)
-   Patient follows with neurology - Dr. Delice Lesch for this - continue with lamotrigine for now - Did have an episode a couple of months ago but this was in the setting of a UTI which likely lowered his seizure threshold - To follow up with Dr. Delice Lesch

## 2016-04-14 NOTE — NC FL2 (Signed)
[place] LEVEL OF CARE SCREENING TOOL     IDENTIFICATION  Patient Name: Todd [name] Birthdate: [birth year] Sex: male Admission Date (Current Location): (Not on file)  [place] and [place] Number:  Herbalist and Address:         Provider Number:    Attending Physician Name and Address:  No att. providers found  Relative Name and Phone Number:  [name]  [phone]    Current Level of Care: Home Recommended Level of Care: [place] Prior Approval Number:    Date Approved/Denied:   PASRR Number:    Discharge Plan: Other (Comment) (Community to SNF once approved)    Current Diagnoses: Patient Active Problem List   Diagnosis Date Noted  . Sebaceous cyst 04/14/2016  . Allergic rhinitis 08/21/2015  . 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 Herpes zoster 03/29/2015  . Convulsion ([place]) 01/16/2015  . Awareness alteration, transient 01/16/2015  . 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 Compression fracture of L2 lumbar vertebra ([place]) 11/20/2014  . Compression fracture of L4 lumbar vertebra ([place]) 11/20/2014  . Insomnia 11/02/2014  . CAD in native artery 10/24/2014  . Coronary artery calcification seen on CAT scan 10/24/2014  . Deviated septum 08/01/2014  . Dysphagia 05/18/2013  . Chronic pulmonary aspiration 05/16/2013  . Abnormality of gait 05/01/2013  . Unspecified constipation 12/07/2012  . Chronic airway obstruction, not elsewhere classified 08/17/2012  . Coronary atherosclerosis of native coronary artery 08/17/2012  . Generalized weakness 04/09/2012  . Osteoporosis with fracture 03/05/2011  . Preventive measure 01/26/2011  . Hyperlipidemia 04/07/2010  . Major depressive disorder, single episode, severe ([place]) 11/13/2009  . Personal history  of traumatic brain injury 06/17/2009  . LOSS, HEARING NOS 11/26/2006  . Aphasia 11/26/2006  . HEPATITIS C 08/10/2006  . PEPTIC ULCER DISEASE 08/10/2006  . Urinary incontinence 08/10/2006    Orientation RESPIRATION BLADDER Height & Weight     Self, Place  Normal Incontinent Weight:   Height:     BEHAVIORAL SYMPTOMS/MOOD NEUROLOGICAL BOWEL NUTRITION STATUS      Incontinent Diet (mechanical soft, nectar thick liquids)  AMBULATORY STATUS COMMUNICATION OF NEEDS Skin   Extensive Assist Non-Verbally Normal                       Personal Care Assistance Level of Assistance  Bathing, Feeding, Dressing, Total care Bathing Assistance: Maximum assistance Feeding assistance: Maximum assistance Dressing Assistance: Maximum assistance Total Care Assistance: Limited assistance   Functional Limitations Info  Sight, Speech Sight Info: Impaired Hearing Info: Adequate Speech Info: Impaired    SPECIAL CARE FACTORS FREQUENCY  PT (By licensed PT), OT (By licensed OT)                    Contractures      Additional Factors Info  Allergies   Allergies Info: Acetaminophen, Naproxen, Beta Adrenergic Blockers, Codeine, Metoprolol           Current Medications (04/14/2016):  This is the current hospital active medication list Current Outpatient Prescriptions  Medication Sig Dispense Refill  . albuterol (PROAIR HFA) 108 (90 BASE) MCG/ACT inhaler inhale 2 puffs INTO THE LUNGS every 6 hours if needed for wheezing 18 g 3  . alendronate (FOSAMAX) 70 MG tablet Take 1 tablet (70 mg total) by mouth once a week. Take with a full  glass of water on an empty stomach. 12 tablet 3  . Ascorbic Acid (VITAMIN C) 1000 MG tablet Take 1,000 mg by mouth daily.    Marland Kitchen aspirin 81 MG chewable tablet Chew 1 tablet (81 mg total) by mouth daily. 30 tablet 2  . atorvastatin (LIPITOR) 80 MG tablet Take 1 tablet (80 mg total) by mouth daily at 6 PM. 30 tablet 2  . B Complex Vitamins (VITAMIN B COMPLEX PO) Take  1 tablet by mouth daily.    Marland Kitchen BIOTIN PO Take 1 tablet by mouth daily.    . budesonide-formoterol (SYMBICORT) 160-4.5 MCG/ACT inhaler Inhale 2 puffs into the lungs 2 (two) times daily. 1 Inhaler 12  . cephALEXin (KEFLEX) 500 MG capsule Take 1 capsule (500 mg total) by mouth 4 (four) times daily. 28 capsule 0  . cetirizine (ZYRTEC) 10 MG tablet Take 1 tablet (10 mg total) by mouth daily. (Patient not taking: Reported on 01/20/2016) 30 tablet 1  . chlorhexidine (PERIDEX) 0.12 % solution Use as directed 15 mLs in the mouth or throat daily.    . chlorpheniramine-HYDROcodone (TUSSIONEX PENNKINETIC ER) 10-8 MG/5ML SUER Take 5 mLs by mouth every 12 (twelve) hours as needed for cough. (Patient not taking: Reported on 01/20/2016) 140 mL 0  . Cholecalciferol (VITAMIN D) 2000 units tablet Take 2,000 Units by mouth daily.    . clopidogrel (PLAVIX) 75 MG tablet Take 1 tablet (75 mg total) by mouth every morning. 30 tablet 11  . Cyanocobalamin (VITAMIN B 12 PO) Take 1 tablet by mouth daily.    Marland Kitchen desloratadine (CLARINEX) 5 MG tablet Take 1 tablet (5 mg total) by mouth daily. (Patient not taking: Reported on 01/20/2016) 30 tablet 2  . desonide (DESOWEN) 0.05 % cream Apply 1 application topically 2 (two) times daily.    . famotidine (PEPCID) 20 MG tablet Take 20 mg by mouth daily.    . Flaxseed, Linseed, (FLAX SEED OIL) 1300 MG CAPS Take 1 tablet by mouth daily.    . fluticasone (FLONASE) 50 MCG/ACT nasal spray instill 1 spray into each nostril once daily 16 g 2  . guaifenesin (HUMIBID E) 400 MG TABS tablet Take 400 mg by mouth 2 (two) times daily. Take every day per wife    . isosorbide mononitrate (IMDUR) 30 MG 24 hr tablet Take 1 tablet (30 mg total) by mouth daily. 30 tablet 2  . ketotifen (CVS ANTIHISTAMINE EYE DROPS) 0.025 % ophthalmic solution Place 1 drop into both eyes 2 (two) times daily. 5 mL 5  . lamoTRIgine (LAMICTAL) 100 MG tablet Take 1 tablet (100 mg total) by mouth 2 (two) times daily. (Patient taking  differently: Take 50 mg by mouth 2 (two) times daily. ) 60 tablet 3  . lamoTRIgine (LAMICTAL) 25 MG tablet 3 tablets twice a day (Patient not taking: Reported on 01/20/2016) 150 tablet 11  . levocetirizine (XYZAL) 2.5 MG/5ML solution Take 5 mLs (2.5 mg total) by mouth every evening. (Patient not taking: Reported on 01/20/2016) 148 mL 12  . levocetirizine (XYZAL) 5 MG tablet Take 5 mg by mouth every evening.    . lidocaine (LIDODERM) 5 % Place 1 patch onto the skin daily. Remove & Discard patch within 12 hours or as directed by MD    . Multiple Vitamin (MULTIVITAMIN WITH MINERALS) TABS tablet Take 1 tablet by mouth every morning.    Marland Kitchen oxyCODONE (OXY IR/ROXICODONE) 5 MG immediate release tablet take 1 tablet by mouth twice a day if needed for severe pain  0  . sodium chloride (OCEAN) 0.65 % SOLN nasal spray Place 1 spray into both nostrils as needed for congestion. 1 Bottle 0  . traMADol (ULTRAM) 50 MG tablet Take 1 tablet (50 mg total) by mouth every 6 (six) hours as needed. 15 tablet 0   No current facility-administered medications for this visit.      Discharge Medications: Please see discharge summary for a list of discharge medications.  Relevant Imaging Results:  Relevant Lab Results:   Additional Information    Willow Ora, LCSW

## 2016-04-14 NOTE — Progress Notes (Signed)
   Subjective:    Patient ID: Todd [name], male    DOB: 1946/03/30, 70 y.o.   MRN: [id number]  HPI  I have seen and evaluated the patient. He is here for routine follow up of his osteoporosis and CAD. Discussed with wife and patient - patient still with weakness at home and would like to be placed in SAR for more intense rehab. He has no other complaints at this time Wife did want patient to have a colonoscopy done but GI would like him to try a colo guard test at home and would proceed with colonoscopy as an inpatient only if positive  Review of Systems  Constitutional: Negative.   HENT: Negative.   Respiratory: Negative.   Cardiovascular: Negative.   Gastrointestinal: Negative.   Musculoskeletal: Positive for back pain. Negative for arthralgias.  Skin: Negative.   Neurological: Positive for speech difficulty.  Psychiatric/Behavioral: Negative.        Objective:   Physical Exam  Constitutional: He appears well-developed and well-nourished.  HENT:  Head: Normocephalic and atraumatic.  Cardiovascular: Normal rate, regular rhythm and normal heart sounds.   Pulmonary/Chest: Effort normal and breath sounds normal. No respiratory distress. He has no wheezes.  Abdominal: Soft. Bowel sounds are normal. He exhibits no distension. There is no tenderness.  Musculoskeletal: Normal range of motion. He exhibits no edema.  Neurological: He is alert.  History of TBI, does respond to questions appropriately and follows commands  Skin: Skin is warm. No erythema.  Psychiatric: He has a normal mood and affect. His behavior is normal.          Assessment & Plan:  Please see problem based charting for assessment and plan

## 2016-04-15 ENCOUNTER — Encounter: Payer: Self-pay | Admitting: Licensed Clinical Social Worker

## 2016-04-15 NOTE — Progress Notes (Signed)
Signed FL2,  recent office note, and copy of chest xray result faxed to [place].  Admissions notified days [facility] will be closed during holidays.

## 2016-05-20 NOTE — Addendum Note (Signed)
Addended by: Hulan Fray on: 05/20/2016 07:56 PM   Modules accepted: Orders

## 2016-06-02 ENCOUNTER — Telehealth: Payer: Self-pay

## 2016-06-02 NOTE — Telephone Encounter (Signed)
Thank you helen.. I agree with the plan

## 2016-06-02 NOTE — Telephone Encounter (Signed)
rtc to caregiver, she states for over an hour pt has c/o pain from R buttock down to appr 2 inches from back of knee, rates it at 7/10 and states he has never had this pain before. She is offered option of going to ED or coming to clinic, she states she cant do this today and tomorrow she has a function [place] to [place] but possibly tomorrow pm may work, she ask for a 1545 appt 2/7 and states she may call and cancel. She is advised if it becomes worse to please call 911 and have pt brought to ED, she is agreeable. Scheduled in [facility]

## 2016-06-02 NOTE — Telephone Encounter (Signed)
Pt wife requesting to speak with a nurse regarding leg problem. Please call back.

## 2016-06-03 ENCOUNTER — Ambulatory Visit: Payer: PPO

## 2016-06-09 ENCOUNTER — Telehealth: Payer: Self-pay

## 2016-06-09 DIAGNOSIS — Z87820 Personal history of traumatic brain injury: Secondary | ICD-10-CM

## 2016-06-09 DIAGNOSIS — R531 Weakness: Secondary | ICD-10-CM

## 2016-06-09 NOTE — Telephone Encounter (Signed)
Questions about home health. Please call pt back.

## 2016-06-09 NOTE — Telephone Encounter (Signed)
Pt's wife requesting PT,OT & speech therapy for [facility].  She speaks highly of you & doesn't mind waiting for you to be back (informed her you're away @ this moment).

## 2016-06-10 ENCOUNTER — Ambulatory Visit (INDEPENDENT_AMBULATORY_CARE_PROVIDER_SITE_OTHER): Payer: Medicare HMO | Admitting: Pulmonary Disease

## 2016-06-10 ENCOUNTER — Telehealth: Payer: Self-pay | Admitting: *Deleted

## 2016-06-10 ENCOUNTER — Telehealth: Payer: Self-pay

## 2016-06-10 VITALS — BP 105/71 | HR 88 | Temp 97.4°F | Ht 71.0 in | Wt 181.5 lb

## 2016-06-10 DIAGNOSIS — J449 Chronic obstructive pulmonary disease, unspecified: Secondary | ICD-10-CM

## 2016-06-10 DIAGNOSIS — Z87891 Personal history of nicotine dependence: Secondary | ICD-10-CM

## 2016-06-10 DIAGNOSIS — R310 Gross hematuria: Secondary | ICD-10-CM

## 2016-06-10 MED ORDER — LAMOTRIGINE 100 MG PO TABS
50.0000 mg | ORAL_TABLET | Freq: Two times a day (BID) | ORAL | Status: DC
Start: 2016-06-10 — End: 2016-08-05

## 2016-06-10 NOTE — Telephone Encounter (Signed)
[name] wife called saying patient is having blood coming out of his penis. This had happened years ago when he had kidney stones. Patient not in pain @ this time. Wife concerned that patient may start having pain in the afterhours & will not have a healthcare provider to evaluate him.  Spoke to aide [employer]. She said " I noticed bright red blood when I transferred him from the commode. It wasn't much maybe 1 cc". Patient is incontinent, no catheters being used.  Appt w/ acc today @ 1:45 pm & wife asking to speak to Dr. Dareen Piano during appt.

## 2016-06-10 NOTE — Telephone Encounter (Signed)
We will evaluate patient on follow up today. Thank you
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
## 2016-06-10 NOTE — Telephone Encounter (Signed)
I have put in the order but I am unsure if they will accept it as his last visit was almost 2 months ago. Let me know what happens. Thank you

## 2016-06-10 NOTE — Patient Instructions (Signed)
Please follow up in 6-8 weeks.

## 2016-06-11 DIAGNOSIS — R310 Gross hematuria: Secondary | ICD-10-CM | POA: Insufficient documentation

## 2016-06-11 NOTE — Assessment & Plan Note (Signed)
Assessment: Few drops of blood noted by caregiver from the urethra. No obvious changes in baseline pain. He was previously seen by urology.  Plan: Unable to obtain urine sample Will refer him to urology

## 2016-06-11 NOTE — Assessment & Plan Note (Signed)
Using Symbicort intermittently.  Will discontinue Symbicort based on new guidelines for COPD treatment. Will move to either LAMA or LABA for daily treatment. Will ask pharmacy for help in figuring out what is covered by [place].

## 2016-06-11 NOTE — Progress Notes (Signed)
Internal Medicine Clinic Attending  I saw and evaluated the patient.  I personally confirmed the key portions of the history and exam documented by Dr. Krall and I reviewed pertinent patient test results.  The assessment, diagnosis, and plan were formulated together and I agree with the documentation in the resident's note.  

## 2016-06-12 ENCOUNTER — Other Ambulatory Visit: Payer: Self-pay | Admitting: *Deleted

## 2016-06-12 ENCOUNTER — Telehealth: Payer: Self-pay

## 2016-06-12 DIAGNOSIS — R310 Gross hematuria: Secondary | ICD-10-CM | POA: Diagnosis not present

## 2016-06-12 NOTE — Addendum Note (Signed)
Addended by: Hulan Fray on: 06/12/2016 01:54 PM   Modules accepted: Orders

## 2016-06-12 NOTE — Telephone Encounter (Signed)
Joycelyn Schmid spoke w/ pt's wife, she at first said they needed a refill then she said she just wanted it on his medlist so people would know he had been on it, it was easy to find in the med history section

## 2016-06-12 NOTE — Telephone Encounter (Signed)
Needs to speak with a nurse regarding cough med. Please call back.

## 2016-06-12 NOTE — Telephone Encounter (Signed)
I tried to call her back but got no answer nor vmail, I only let it ring 8 times

## 2016-06-15 ENCOUNTER — Telehealth: Payer: Self-pay | Admitting: Internal Medicine

## 2016-06-15 DIAGNOSIS — Z87820 Personal history of traumatic brain injury: Secondary | ICD-10-CM

## 2016-06-15 DIAGNOSIS — J449 Chronic obstructive pulmonary disease, unspecified: Secondary | ICD-10-CM

## 2016-06-15 NOTE — Telephone Encounter (Signed)
[name] wife called in and is now interested in [facility]. Wife has already spoken to [place] take Coordinator) @ [place].  She is requesting PT In Home Eval Referral Placed.  Per wife she must have this referral started before Friday 23, 2018.

## 2016-06-15 NOTE — Telephone Encounter (Signed)
Referral placed again.

## 2016-06-16 NOTE — Telephone Encounter (Signed)
Pt's wife called back this morning. Explained to her that the Referral was placed and Nurse Joycelyn Schmid will go ahead and send it to [facility] to process.

## 2016-06-20 ENCOUNTER — Other Ambulatory Visit: Payer: Self-pay | Admitting: Internal Medicine

## 2016-06-22 ENCOUNTER — Telehealth: Payer: Self-pay | Admitting: Internal Medicine

## 2016-06-22 NOTE — Telephone Encounter (Signed)
Per Admissions Coordinator your referral was received to complete the process per 2018 [facility] requirements to admit from home are as follows:  Physician order written on Script from PCP Stating: "[place] Placement for Rehab Services" Physical therapy evaluation within 48 hours of admit date requested.  To be be faxed to Deirdre Pippins, Admissions Coordinator @ [phone].    Also starting 06/29/2016 while his Wife is out of town an [facility] needs to be completed and forwarded to [facility]  SNF.   His FL2 has been placed in your box for completion as well.

## 2016-06-24 ENCOUNTER — Telehealth: Payer: Self-pay | Admitting: Pharmacist

## 2016-06-24 NOTE — Progress Notes (Signed)
Trying to reach patient for help with tiotropium handihaler (Spiriva). Patient requested a change in device. The only LAMA listed on [place] formulary is Spiriva. Patient may be able to obtain the respimat device for Spiriva.  Unable to reach patient and no VM available.  Will try again at another time

## 2016-06-29 DIAGNOSIS — R1312 Dysphagia, oropharyngeal phase: Secondary | ICD-10-CM | POA: Diagnosis not present

## 2016-06-29 DIAGNOSIS — R569 Unspecified convulsions: Secondary | ICD-10-CM | POA: Diagnosis not present

## 2016-06-29 DIAGNOSIS — R2689 Other abnormalities of gait and mobility: Secondary | ICD-10-CM | POA: Diagnosis not present

## 2016-06-29 DIAGNOSIS — J449 Chronic obstructive pulmonary disease, unspecified: Secondary | ICD-10-CM | POA: Diagnosis not present

## 2016-06-29 DIAGNOSIS — J309 Allergic rhinitis, unspecified: Secondary | ICD-10-CM | POA: Diagnosis not present

## 2016-06-29 DIAGNOSIS — Z85828 Personal history of other malignant neoplasm of skin: Secondary | ICD-10-CM | POA: Diagnosis not present

## 2016-06-29 DIAGNOSIS — R278 Other lack of coordination: Secondary | ICD-10-CM | POA: Diagnosis not present

## 2016-06-29 DIAGNOSIS — Z7982 Long term (current) use of aspirin: Secondary | ICD-10-CM | POA: Diagnosis not present

## 2016-06-29 DIAGNOSIS — G40109 Localization-related (focal) (partial) symptomatic epilepsy and epileptic syndromes with simple partial seizures, not intractable, without status epilepticus: Secondary | ICD-10-CM | POA: Diagnosis not present

## 2016-06-29 DIAGNOSIS — M810 Age-related osteoporosis without current pathological fracture: Secondary | ICD-10-CM | POA: Diagnosis not present

## 2016-06-29 DIAGNOSIS — E7800 Pure hypercholesterolemia, unspecified: Secondary | ICD-10-CM | POA: Diagnosis not present

## 2016-06-29 DIAGNOSIS — Z7983 Long term (current) use of bisphosphonates: Secondary | ICD-10-CM | POA: Diagnosis not present

## 2016-06-29 DIAGNOSIS — Z87891 Personal history of nicotine dependence: Secondary | ICD-10-CM | POA: Diagnosis not present

## 2016-06-29 DIAGNOSIS — I700 Atherosclerosis of aorta: Secondary | ICD-10-CM | POA: Diagnosis not present

## 2016-06-29 DIAGNOSIS — R310 Gross hematuria: Secondary | ICD-10-CM | POA: Diagnosis not present

## 2016-06-29 DIAGNOSIS — R1319 Other dysphagia: Secondary | ICD-10-CM | POA: Diagnosis present

## 2016-06-29 DIAGNOSIS — Z7902 Long term (current) use of antithrombotics/antiplatelets: Secondary | ICD-10-CM | POA: Diagnosis not present

## 2016-06-29 DIAGNOSIS — R1314 Dysphagia, pharyngoesophageal phase: Secondary | ICD-10-CM | POA: Diagnosis not present

## 2016-06-29 DIAGNOSIS — G40909 Epilepsy, unspecified, not intractable, without status epilepticus: Secondary | ICD-10-CM | POA: Diagnosis not present

## 2016-06-29 DIAGNOSIS — Z8673 Personal history of transient ischemic attack (TIA), and cerebral infarction without residual deficits: Secondary | ICD-10-CM | POA: Diagnosis not present

## 2016-06-29 DIAGNOSIS — M6281 Muscle weakness (generalized): Secondary | ICD-10-CM | POA: Diagnosis not present

## 2016-06-29 DIAGNOSIS — Z79899 Other long term (current) drug therapy: Secondary | ICD-10-CM | POA: Diagnosis not present

## 2016-06-29 DIAGNOSIS — B1920 Unspecified viral hepatitis C without hepatic coma: Secondary | ICD-10-CM | POA: Diagnosis not present

## 2016-06-29 DIAGNOSIS — R633 Feeding difficulties: Secondary | ICD-10-CM | POA: Diagnosis not present

## 2016-06-29 DIAGNOSIS — I2510 Atherosclerotic heart disease of native coronary artery without angina pectoris: Secondary | ICD-10-CM | POA: Diagnosis not present

## 2016-06-29 DIAGNOSIS — R1310 Dysphagia, unspecified: Secondary | ICD-10-CM | POA: Diagnosis not present

## 2016-06-29 DIAGNOSIS — Z886 Allergy status to analgesic agent status: Secondary | ICD-10-CM | POA: Diagnosis not present

## 2016-06-29 DIAGNOSIS — Z888 Allergy status to other drugs, medicaments and biological substances status: Secondary | ICD-10-CM | POA: Diagnosis not present

## 2016-06-29 DIAGNOSIS — Z87820 Personal history of traumatic brain injury: Secondary | ICD-10-CM | POA: Diagnosis not present

## 2016-06-29 DIAGNOSIS — K219 Gastro-esophageal reflux disease without esophagitis: Secondary | ICD-10-CM | POA: Diagnosis not present

## 2016-06-30 DIAGNOSIS — I2510 Atherosclerotic heart disease of native coronary artery without angina pectoris: Secondary | ICD-10-CM | POA: Diagnosis not present

## 2016-06-30 DIAGNOSIS — G40909 Epilepsy, unspecified, not intractable, without status epilepticus: Secondary | ICD-10-CM | POA: Diagnosis not present

## 2016-06-30 DIAGNOSIS — J449 Chronic obstructive pulmonary disease, unspecified: Secondary | ICD-10-CM | POA: Diagnosis not present

## 2016-06-30 DIAGNOSIS — Z87820 Personal history of traumatic brain injury: Secondary | ICD-10-CM | POA: Diagnosis not present

## 2016-06-30 NOTE — Telephone Encounter (Signed)
error 

## 2016-07-01 DIAGNOSIS — J449 Chronic obstructive pulmonary disease, unspecified: Secondary | ICD-10-CM | POA: Diagnosis not present

## 2016-07-01 DIAGNOSIS — M810 Age-related osteoporosis without current pathological fracture: Secondary | ICD-10-CM | POA: Diagnosis not present

## 2016-07-01 DIAGNOSIS — R1310 Dysphagia, unspecified: Secondary | ICD-10-CM | POA: Diagnosis not present

## 2016-07-01 DIAGNOSIS — E7800 Pure hypercholesterolemia, unspecified: Secondary | ICD-10-CM | POA: Diagnosis not present

## 2016-07-01 NOTE — Progress Notes (Signed)
Unable to reach patient.

## 2016-07-02 ENCOUNTER — Telehealth: Payer: Self-pay | Admitting: *Deleted

## 2016-07-02 DIAGNOSIS — J449 Chronic obstructive pulmonary disease, unspecified: Secondary | ICD-10-CM

## 2016-07-02 MED ORDER — TIOTROPIUM BROMIDE MONOHYDRATE 2.5 MCG/ACT IN AERS: 2.0000 | INHALATION_SPRAY | Freq: Every day | RESPIRATORY_TRACT | 11 refills | Status: DC

## 2016-07-02 NOTE — Telephone Encounter (Signed)
WIFE CALLED, STATING THAT SOMEONE CALLED HER FROM [facility], DID NOT LEAVE THEIR NAME, CALLED HER ABOUT 4PM. TOLD HER THAT WOULD TRY TO FIND OUT WHO CALLED HER AND GET A CALL BACK.

## 2016-07-07 ENCOUNTER — Observation Stay (HOSPITAL_COMMUNITY)
Admission: EM | Admit: 2016-07-07 | Discharge: 2016-07-08 | Disposition: A | Discharge status: Skilled Nursing Facility | Payer: Medicare HMO | Attending: Student in an Organized Health Care Education/Training Program | Admitting: Student in an Organized Health Care Education/Training Program

## 2016-07-07 ENCOUNTER — Emergency Department (HOSPITAL_COMMUNITY): Payer: Medicare HMO

## 2016-07-07 ENCOUNTER — Encounter (HOSPITAL_COMMUNITY): Payer: Self-pay

## 2016-07-07 ENCOUNTER — Ambulatory Visit: Payer: PPO | Admitting: Internal Medicine

## 2016-07-07 ENCOUNTER — Telehealth: Payer: Self-pay | Admitting: Internal Medicine

## 2016-07-07 DIAGNOSIS — Z888 Allergy status to other drugs, medicaments and biological substances status: Secondary | ICD-10-CM | POA: Insufficient documentation

## 2016-07-07 DIAGNOSIS — Z85828 Personal history of other malignant neoplasm of skin: Secondary | ICD-10-CM | POA: Insufficient documentation

## 2016-07-07 DIAGNOSIS — R1314 Dysphagia, pharyngoesophageal phase: Secondary | ICD-10-CM | POA: Diagnosis not present

## 2016-07-07 DIAGNOSIS — B1920 Unspecified viral hepatitis C without hepatic coma: Secondary | ICD-10-CM | POA: Insufficient documentation

## 2016-07-07 DIAGNOSIS — J309 Allergic rhinitis, unspecified: Secondary | ICD-10-CM | POA: Insufficient documentation

## 2016-07-07 DIAGNOSIS — Z87820 Personal history of traumatic brain injury: Secondary | ICD-10-CM | POA: Insufficient documentation

## 2016-07-07 DIAGNOSIS — Z7902 Long term (current) use of antithrombotics/antiplatelets: Secondary | ICD-10-CM | POA: Insufficient documentation

## 2016-07-07 DIAGNOSIS — J449 Chronic obstructive pulmonary disease, unspecified: Secondary | ICD-10-CM | POA: Insufficient documentation

## 2016-07-07 DIAGNOSIS — Z87891 Personal history of nicotine dependence: Secondary | ICD-10-CM | POA: Insufficient documentation

## 2016-07-07 DIAGNOSIS — R1310 Dysphagia, unspecified: Secondary | ICD-10-CM

## 2016-07-07 DIAGNOSIS — K219 Gastro-esophageal reflux disease without esophagitis: Secondary | ICD-10-CM | POA: Insufficient documentation

## 2016-07-07 DIAGNOSIS — R1312 Dysphagia, oropharyngeal phase: Principal | ICD-10-CM | POA: Insufficient documentation

## 2016-07-07 DIAGNOSIS — Z79899 Other long term (current) drug therapy: Secondary | ICD-10-CM | POA: Insufficient documentation

## 2016-07-07 DIAGNOSIS — M810 Age-related osteoporosis without current pathological fracture: Secondary | ICD-10-CM | POA: Diagnosis not present

## 2016-07-07 DIAGNOSIS — Z7983 Long term (current) use of bisphosphonates: Secondary | ICD-10-CM | POA: Insufficient documentation

## 2016-07-07 DIAGNOSIS — I2510 Atherosclerotic heart disease of native coronary artery without angina pectoris: Secondary | ICD-10-CM | POA: Insufficient documentation

## 2016-07-07 DIAGNOSIS — Z886 Allergy status to analgesic agent status: Secondary | ICD-10-CM | POA: Insufficient documentation

## 2016-07-07 DIAGNOSIS — Z8673 Personal history of transient ischemic attack (TIA), and cerebral infarction without residual deficits: Secondary | ICD-10-CM | POA: Diagnosis not present

## 2016-07-07 DIAGNOSIS — I700 Atherosclerosis of aorta: Secondary | ICD-10-CM | POA: Diagnosis not present

## 2016-07-07 DIAGNOSIS — Z7982 Long term (current) use of aspirin: Secondary | ICD-10-CM | POA: Insufficient documentation

## 2016-07-07 DIAGNOSIS — R569 Unspecified convulsions: Secondary | ICD-10-CM | POA: Insufficient documentation

## 2016-07-07 DIAGNOSIS — R130 Aphagia: Secondary | ICD-10-CM | POA: Diagnosis present

## 2016-07-07 DIAGNOSIS — R1319 Other dysphagia: Secondary | ICD-10-CM

## 2016-07-07 LAB — CBC WITH DIFFERENTIAL/PLATELET
BASOS PCT: 0 %
Basophils Absolute: 0 10*3/uL (ref 0.0–0.1)
Eosinophils Absolute: 0.1 10*3/uL (ref 0.0–0.7)
Eosinophils Relative: 1 %
HEMATOCRIT: 43.8 % (ref 39.0–52.0)
Hemoglobin: 14.3 g/dL (ref 13.0–17.0)
LYMPHS PCT: 15 %
Lymphs Abs: 2.4 10*3/uL (ref 0.7–4.0)
MCH: 32.5 pg (ref 26.0–34.0)
MCHC: 32.6 g/dL (ref 30.0–36.0)
MCV: 99.5 fL (ref 78.0–100.0)
MONO ABS: 1.1 10*3/uL — AB (ref 0.1–1.0)
MONOS PCT: 7 %
NEUTROS ABS: 11.9 10*3/uL — AB (ref 1.7–7.7)
NEUTROS PCT: 77 %
Platelets: 216 10*3/uL (ref 150–400)
RBC: 4.4 MIL/uL (ref 4.22–5.81)
RDW: 13.6 % (ref 11.5–15.5)
WBC: 15.5 10*3/uL — ABNORMAL HIGH (ref 4.0–10.5)

## 2016-07-07 LAB — BASIC METABOLIC PANEL
ANION GAP: 9 (ref 5–15)
BUN: 25 mg/dL — ABNORMAL HIGH (ref 6–20)
CHLORIDE: 108 mmol/L (ref 101–111)
CO2: 23 mmol/L (ref 22–32)
Calcium: 9.5 mg/dL (ref 8.9–10.3)
Creatinine, Ser: 1 mg/dL (ref 0.61–1.24)
GFR calc non Af Amer: >60 mL/min (ref >60)
GLUCOSE: 106 mg/dL — AB (ref 65–99)
Potassium: 4 mmol/L (ref 3.5–5.1)
Sodium: 140 mmol/L (ref 135–145)

## 2016-07-07 MED ORDER — SODIUM CHLORIDE 0.9 % IV SOLN
Freq: Once | INTRAVENOUS | Status: AC
Start: 2016-07-07 — End: 2016-07-07
  Administered 2016-07-07: 17:00:00 via INTRAVENOUS

## 2016-07-07 NOTE — ED Notes (Signed)
Patient transported to X-ray 
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
## 2016-07-07 NOTE — Telephone Encounter (Signed)
Thank you Bonnita Nasuti... We will follow up on his case

## 2016-07-07 NOTE — Telephone Encounter (Signed)
Pt's wife would like a call back in reference to the patient being admitted [facility].

## 2016-07-07 NOTE — Telephone Encounter (Signed)
Spoke w/ pt's wife, she is in [employer] and pt has been admitted to [facility] ed for eval of swallowing and nutritional status, she wanted to provide dr Dareen Piano with information and if pt needed to be transferred to [facility] for care, she was reassured and informed that dr Dareen Piano can follow what is provided for pt through [facility]. She was reassured several times
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
## 2016-07-07 NOTE — ED Triage Notes (Signed)
Per EMS- Patient is a resident of [facility]. Patient failed barium swallow screen. Patient was sent from the facility for evaluation of dysphagia/aspiration.

## 2016-07-07 NOTE — ED Notes (Signed)
Todd [name]/wife [phone]

## 2016-07-08 ENCOUNTER — Inpatient Hospital Stay (HOSPITAL_COMMUNITY): Payer: Medicare HMO

## 2016-07-08 ENCOUNTER — Ambulatory Visit: Payer: Medicare HMO | Admitting: Internal Medicine

## 2016-07-08 DIAGNOSIS — Z8673 Personal history of transient ischemic attack (TIA), and cerebral infarction without residual deficits: Secondary | ICD-10-CM | POA: Diagnosis not present

## 2016-07-08 DIAGNOSIS — J449 Chronic obstructive pulmonary disease, unspecified: Secondary | ICD-10-CM | POA: Diagnosis not present

## 2016-07-08 DIAGNOSIS — R8271 Bacteriuria: Secondary | ICD-10-CM | POA: Diagnosis not present

## 2016-07-08 DIAGNOSIS — L0291 Cutaneous abscess, unspecified: Secondary | ICD-10-CM | POA: Diagnosis not present

## 2016-07-08 DIAGNOSIS — Z888 Allergy status to other drugs, medicaments and biological substances status: Secondary | ICD-10-CM

## 2016-07-08 DIAGNOSIS — R1312 Dysphagia, oropharyngeal phase: Secondary | ICD-10-CM | POA: Diagnosis not present

## 2016-07-08 DIAGNOSIS — J309 Allergic rhinitis, unspecified: Secondary | ICD-10-CM | POA: Diagnosis not present

## 2016-07-08 DIAGNOSIS — Z886 Allergy status to analgesic agent status: Secondary | ICD-10-CM | POA: Diagnosis not present

## 2016-07-08 DIAGNOSIS — R278 Other lack of coordination: Secondary | ICD-10-CM | POA: Diagnosis not present

## 2016-07-08 DIAGNOSIS — G40909 Epilepsy, unspecified, not intractable, without status epilepticus: Secondary | ICD-10-CM | POA: Diagnosis not present

## 2016-07-08 DIAGNOSIS — G40109 Localization-related (focal) (partial) symptomatic epilepsy and epileptic syndromes with simple partial seizures, not intractable, without status epilepticus: Secondary | ICD-10-CM | POA: Diagnosis not present

## 2016-07-08 DIAGNOSIS — B1920 Unspecified viral hepatitis C without hepatic coma: Secondary | ICD-10-CM | POA: Diagnosis not present

## 2016-07-08 DIAGNOSIS — R310 Gross hematuria: Secondary | ICD-10-CM | POA: Diagnosis not present

## 2016-07-08 DIAGNOSIS — Z885 Allergy status to narcotic agent status: Secondary | ICD-10-CM | POA: Diagnosis not present

## 2016-07-08 DIAGNOSIS — M810 Age-related osteoporosis without current pathological fracture: Secondary | ICD-10-CM | POA: Diagnosis not present

## 2016-07-08 DIAGNOSIS — K219 Gastro-esophageal reflux disease without esophagitis: Secondary | ICD-10-CM | POA: Diagnosis not present

## 2016-07-08 DIAGNOSIS — M6281 Muscle weakness (generalized): Secondary | ICD-10-CM | POA: Diagnosis not present

## 2016-07-08 DIAGNOSIS — R569 Unspecified convulsions: Secondary | ICD-10-CM | POA: Diagnosis not present

## 2016-07-08 DIAGNOSIS — Z87820 Personal history of traumatic brain injury: Secondary | ICD-10-CM | POA: Diagnosis not present

## 2016-07-08 DIAGNOSIS — R1310 Dysphagia, unspecified: Secondary | ICD-10-CM | POA: Diagnosis not present

## 2016-07-08 DIAGNOSIS — I2510 Atherosclerotic heart disease of native coronary artery without angina pectoris: Secondary | ICD-10-CM | POA: Diagnosis not present

## 2016-07-08 DIAGNOSIS — T17320A Food in larynx causing asphyxiation, initial encounter: Secondary | ICD-10-CM | POA: Diagnosis not present

## 2016-07-08 LAB — URINALYSIS, ROUTINE W REFLEX MICROSCOPIC
BILIRUBIN URINE: NEGATIVE
Glucose, UA: NEGATIVE mg/dL
Hgb urine dipstick: NEGATIVE
Ketones, ur: NEGATIVE mg/dL
Nitrite: POSITIVE — AB
PH: 7 (ref 5.0–8.0)
Protein, ur: NEGATIVE mg/dL
SPECIFIC GRAVITY, URINE: 1.023 (ref 1.005–1.030)

## 2016-07-08 LAB — CBC WITH DIFFERENTIAL/PLATELET
Basophils Absolute: 0 10*3/uL (ref 0.0–0.1)
Basophils Relative: 0 %
Eosinophils Absolute: 0.3 10*3/uL (ref 0.0–0.7)
Eosinophils Relative: 3 %
HEMATOCRIT: 42.4 % (ref 39.0–52.0)
HEMOGLOBIN: 13.6 g/dL (ref 13.0–17.0)
LYMPHS ABS: 2.1 10*3/uL (ref 0.7–4.0)
LYMPHS PCT: 22 %
MCH: 32 pg (ref 26.0–34.0)
MCHC: 32.1 g/dL (ref 30.0–36.0)
MCV: 99.8 fL (ref 78.0–100.0)
MONOS PCT: 7 %
Monocytes Absolute: 0.7 10*3/uL (ref 0.1–1.0)
NEUTROS PCT: 68 %
Neutro Abs: 6.4 10*3/uL (ref 1.7–7.7)
Platelets: 191 10*3/uL (ref 150–400)
RBC: 4.25 MIL/uL (ref 4.22–5.81)
RDW: 13.4 % (ref 11.5–15.5)
WBC: 9.4 10*3/uL (ref 4.0–10.5)

## 2016-07-08 LAB — BASIC METABOLIC PANEL
Anion gap: 8 (ref 5–15)
BUN: 19 mg/dL (ref 6–20)
CHLORIDE: 109 mmol/L (ref 101–111)
CO2: 26 mmol/L (ref 22–32)
CREATININE: 0.85 mg/dL (ref 0.61–1.24)
Calcium: 9 mg/dL (ref 8.9–10.3)
GFR calc non Af Amer: >60 mL/min (ref >60)
Glucose, Bld: 94 mg/dL (ref 65–99)
POTASSIUM: 3.9 mmol/L (ref 3.5–5.1)
SODIUM: 143 mmol/L (ref 135–145)

## 2016-07-08 LAB — MRSA PCR SCREENING: MRSA BY PCR: NEGATIVE

## 2016-07-08 MED ORDER — SODIUM CHLORIDE 0.9 % IV SOLN
INTRAVENOUS | Status: DC
  Administered 2016-07-08: 01:00:00 via INTRAVENOUS

## 2016-07-08 MED ORDER — ENOXAPARIN SODIUM 40 MG/0.4ML ~~LOC~~ SOLN
40.0000 mg | SUBCUTANEOUS | Status: DC
Start: 2016-07-08 — End: 2016-07-08
  Administered 2016-07-08: 40 mg via SUBCUTANEOUS
  Filled 2016-07-08: qty 0.4

## 2016-07-08 MED ORDER — RESOURCE THICKENUP CLEAR PO POWD
ORAL | Status: DC | PRN
  Filled 2016-07-08 (×2): qty 125

## 2016-07-08 NOTE — Progress Notes (Addendum)
Report called to RN at [facility]. Pt transferred via [facility].

## 2016-07-08 NOTE — Progress Notes (Signed)
CSW received call that patient is being discharged back to [facility] today- CSW called insurance rep and confirmed that patient does NOT need new auth to return since only stayed one night at [facility] is agreeable to taking patient back today and has already had paperwork renewed with wife.  Patient will discharge to [facility] Anticipated discharge date: 3/14 Family notified: wife Transportation by [employer]- called at 3:10pm  CSW signing off.  Jorge Ny, LCSW Clinical Social Worker [phone]

## 2016-07-08 NOTE — Consult Note (Signed)
   [facility] CM Inpatient Consult   07/08/2016  KRISTINA BERTONE [birth year] [phone]   Received notification of the patient's hospitalization. Chart reviewed for needs.  Patient is currently in observation for difficulty swallowing.  Patient is scheduled to return to [facility] today.  [name] spouse according to inpatient RNCM is in [place] for work.  No community [facility] Care Management follow up needs are identified at this time as the patient will return to a skilled facility.  Spoke with inpatient RNCM, Levada Dy and identified no needs and confirms wife is in [place] for work. For questions, please contact:  [name], RN BSN [facility] Liaison  [phone] business mobile phone Toll free office [phone]

## 2016-07-08 NOTE — Progress Notes (Signed)
Pt arrived to unit with no distress noted. PT is alert and oriented but unable to express needs clearly. Vitals doc in flowsheet, comfort provided, safety precaution initiated.

## 2016-07-08 NOTE — Progress Notes (Addendum)
  Speech Language Pathology Treatment: Dysphagia (dysphagia education)  Patient Details Name: Todd [name] MRN: [id number] DOB: 20-Sep-1945 Today's Date: 07/08/2016 Time: 1203-1225 SLP Time Calculation (min) (ACUTE ONLY): 22 min  Assessment / Plan / Recommendation Clinical Impression  SLP called pt's wife (currently in [place] for work) and had a lengthy conversation providing education/results/recommendations following today's MBS. Wife reviewing pt's dysphagia history and current status. She reports pt is on nectar thick liquids and describes a mechanical soft texture at SNF.SLP explained clinical reasoning for recommendation of honey thick and Dys 1 (puree) which wife understands and in agreement with. SLP stated uncertainty of swallow prognosis (change in medical status or chronic dysphagia that is progressing?). Asking medical questions re: current status and this therapist referred her to MD. RN already aware but reminded her of wife's desire to speak with MD "when he/she comes in the door, before speaking to [name]." Recommend outpatient MBS in a month or so if desired to assess swallow at a time he is not hospitalized.      HPI HPI: 71 yr old sent to ER from [facility] after "failing the swallow screen." PMH dysphagia, FEES 2015 recommending Dys 2, nectar thick liquids, TBI 1991, COPD, GERD, pna. Per chart difficulty swallowing is chronic but worsened more than 1 week ago. CXR no parenchymal consolidation is observed. No definite endobronchial barium depoposition is observed. COPD. Thoracic aortic atherosclerosis.       SLP Plan  Continue with current plan of care       Recommendations  Diet recommendations: Dysphagia 1 (puree);Honey-thick liquid Liquids provided via: Cup;No straw Medication Administration: Crushed with puree Supervision: Patient able to self feed;Full supervision/cueing for compensatory strategies Compensations: Minimize environmental distractions;Slow rate;Small  sips/bites Postural Changes and/or Swallow Maneuvers: Seated upright 90 degrees;Upright 30-60 min after meal                Oral Care Recommendations: Oral care BID Follow up Recommendations: Skilled Nursing facility SLP Visit Diagnosis: Dysphagia, oropharyngeal phase (R13.12) Plan: Continue with current plan of care       GO                [name] 07/08/2016, 1:45 PM [place] Mon[employer]Ed [employer] [phone]

## 2016-07-08 NOTE — Progress Notes (Signed)
Spoke with the patient's wife [name] in regards pt's new diet and also had the speech therapist to speak with the wife also.

## 2016-07-08 NOTE — Progress Notes (Signed)
   Subjective: No acute events overnight.  Barium swallow w/ SLP completed this morning.  Objective:  Vital signs in last 24 hours: Vitals:   07/07/16 2154 07/07/16 2302 07/08/16 0012 07/08/16 0454  BP: 134/87 134/82 112/69 125/84  Pulse: 74 75 74 70  Resp: 18 20 18    Temp:  98.7 F (37.1 C) 97.9 F (36.6 C) 97.6 F (36.4 C)  TempSrc:  Oral Oral Oral  SpO2: 95% 96% 93% 99%  Weight:      Height:       Physical Exam  Constitutional: He appears well-developed and well-nourished. No distress.  HENT:  Head: Normocephalic and atraumatic.  Mouth/Throat: Oropharynx is clear and moist.  Cardiovascular: Normal rate and regular rhythm.   Pulmonary/Chest: Effort normal and breath sounds normal.  Neurological:  Alert, oriented Answer questions in few words with dysarthria   Modified Barium Swallow 07/08/2016 Clinical Impression             Moderate oral and pharyngeal dysphagia due to sensorimotor deficits specifically delayed swallow initiation, decreased laryngeal elevation and closure resulting in deep penetration to vocal cords with inconsistent sensation. Reflexive and cued throat clear removed majority of penetrates. Mod-max vallecular and mild pyriform sinus residue (reduced sensation) with incomplete clearance with multiple swallows. Aspiration risk is high and recommending Dys 1 texture and honey thick liquids, swallow 2 times, cough after every other bites/sips with continued ST.  Assessment/Plan:  Principal Problem:   Dysphagia   71 y.o. male with history of TBI and dysphagia sent to the ED from his SNF with concern for dysphagia and failed FEES swallow screen.  Evaluated by SLP with MBS and updated recommendations.  His weight is stable and he has no clinical aspiration events.  Ready for discharge to facility.  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 -Dys 1 diet w/ honey thick liquids  Fluids: none Diet: dys 1, honey thick liquids DVT Prophylaxis:  lovenox Code Status: full  Dispo: Anticipated discharge today.  Minus Liberty, MD 07/08/2016, 1:32 PM Pager: [phone]

## 2016-07-08 NOTE — Progress Notes (Signed)
Modified Barium Swallow Progress Note  Patient Details  Name: Todd [name] MRN: [id number] Date of Birth: October 01, 1945  Today's Date: 07/08/2016  Modified Barium Swallow completed.  Full report located under Chart Review in the Imaging Section.  Brief recommendations include the following:  Clinical Impression  Moderate oral and pharyngeal dysphagia due to sensorimotor deficits specifically delayed swallow initiation, decreased laryngeal elevation and closure resulting in deep penetration to vocal cords with inconsistent sensation. Reflexive and cued throat clear removed majority of penetrates. Mod-max vallecular and mild pyriform sinus residue (reduced sensation) with incomplete clearance with multiple swallows. Aspiration risk is high and recommending Dys 1 texture and honey thick liquids, swallow 2 times, cough after every other bites/sips with continued ST.   Swallow Evaluation Recommendations       SLP Diet Recommendations: Dysphagia 1 (Puree) solids;Honey thick liquids   Liquid Administration via: Cup;No straw   Medication Administration: Crushed with puree   Supervision: Patient able to self feed;Full supervision/cueing for compensatory strategies   Compensations: Minimize environmental distractions;Slow rate;Small sips/bites   Postural Changes: Seated upright at 90 degrees;Remain semi-upright after after feeds/meals (Comment)   Oral Care Recommendations: Oral care BID        [name] 07/08/2016,9:41 AM   Orbie Pyo Colvin Caroli.Ed [employer] [phone]

## 2016-07-08 NOTE — Discharge Summary (Signed)
Name: Todd [name] MRN: [id number] DOB: 22-May-1945 71 y.o. PCP: Aldine Contes, MD  Date of Admission: 07/07/2016  1:12 PM Date of Discharge: 07/08/2016 Attending Physician: Axel Filler, MD  Discharge Diagnosis:  Principal Problem:   Dysphagia   Discharge Medications: Allergies as of 07/08/2016      Reactions   Acetaminophen Other (See Comments)   Pt is unable to take this medication due to liver disease.     Aleve [naproxen] Other (See Comments)   Pt is unable to take this medication due to liver disease.     Beta Adrenergic Blockers Other (See Comments)   Reaction:  Disorientation    Codeine Hives, Other (See Comments)   Reaction:  Inflammation   Metoprolol Other (See Comments)   Reaction:  Dizziness    Nsaids Other (See Comments)   Pt is unable to take this medication due to liver disease.        Medication List    TAKE these medications   albuterol 108 (90 Base) MCG/ACT inhaler Commonly known as:  PROAIR HFA inhale 2 puffs INTO THE LUNGS every 6 hours if needed for wheezing What changed:  how much to take  how to take this  when to take this  reasons to take this  additional instructions   alendronate 70 MG tablet Commonly known as:  FOSAMAX Take 1 tablet (70 mg total) by mouth once a week. Take with a full glass of water on an empty stomach. What changed:  when to take this  additional instructions   aspirin 81 MG chewable tablet Chew 81 mg by mouth daily.   atorvastatin 80 MG tablet Commonly known as:  LIPITOR Take 1 tablet (80 mg total) by mouth daily at 6 PM.   b complex vitamins tablet Take 1 tablet by mouth daily.   Biotin 1000 MCG tablet Take 1,000 mcg by mouth daily.   clopidogrel 75 MG tablet Commonly known as:  PLAVIX take 1 tablet by mouth every morning   desonide 0.05 % cream Commonly known as:  DESOWEN Apply 1 application topically 2 (two) times daily. Pt applies to hair line.   famotidine 20 MG  tablet Commonly known as:  PEPCID Take 20 mg by mouth daily.   fluticasone 50 MCG/ACT nasal spray Commonly known as:  FLONASE instill 1 spray into each nostril once daily   guaifenesin 400 MG Tabs tablet Commonly known as:  HUMIBID E Take 400 mg by mouth 2 (two) times daily.   isosorbide mononitrate 30 MG 24 hr tablet Commonly known as:  IMDUR Take 1 tablet (30 mg total) by mouth daily.   ketotifen 0.025 % ophthalmic solution Commonly known as:  [employer] ANTIHISTAMINE EYE DROPS Place 1 drop into both eyes 2 (two) times daily.   lamoTRIgine 100 MG tablet Commonly known as:  LAMICTAL Take 0.5 tablets (50 mg total) by mouth 2 (two) times daily.   levocetirizine 5 MG tablet Commonly known as:  XYZAL Take 5 mg by mouth every evening.   lidocaine 5 % Commonly known as:  LIDODERM Place 1 patch onto the skin daily. Pt applies to back.  Remove & Discard patch within 12 hours or as directed by MD   multivitamin with minerals Tabs tablet Take 1 tablet by mouth daily.   NUTRITIONAL SUPPLEMENT PLUS Liqd Take 120 mLs by mouth 3 (three) times daily. Med Pass 2.0   oxyCODONE 5 MG immediate release tablet Commonly known as:  Oxy IR/ROXICODONE Take 2.5-5 mg by  mouth 2 (two) times daily as needed for severe pain.   TUSSIONEX PENNKINETIC ER 10-8 MG/5ML Suer Generic drug:  chlorpheniramine-HYDROcodone Take 5 mLs by mouth every 12 (twelve) hours as needed for cough.   vitamin C 1000 MG tablet Take 1,000 mg by mouth daily.   Vitamin D 2000 units tablet Take 2,000 Units by mouth daily.       Disposition and follow-up:   [name] was discharged from [facility] in Stable condition.  At the hospital follow up visit please address:  1.  Dysphagia.  Make sure he is not losing weight, no clinical aspiration events.  2.  Labs / imaging needed at time of follow-up: none  3.  Pending labs/ test needing follow-up: none  Follow-up Appointments:   Hospital  Course by problem list: Principal Problem:   Dysphagia   1. Dysphagia [name] is a 71 year old man with history of TBI who was sent to the ED from his SNF with concern for dysphagia and failing a FEES swallowing screen there.  Otherwise he is in his usually state of health.  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  His weight is stable and he has not had any admission for aspiration events.  Modified barium swallow and SLP evaluation this admission show moderate oropharyngeal dysphagia, and recommend Dysphagia 1 diet with honey thick liquids, aspiration precautions, and continued speech therapy.  He has no other acute medical conditions.  Discharge Vitals:   BP 125/84 (BP Location: Left Arm)   Pulse 70   Temp 97.6 F (36.4 C) (Oral)   Resp 18   Ht 5\' 9"  (1.753 m)   Wt 175 lb (79.4 kg)   SpO2 99%   BMI 25.84 kg/m   Pertinent Labs, Studies, and Procedures:   Modified Barium Swallow 07/08/2016  SLP Clinical Impression             Moderate oral and pharyngeal dysphagia due to sensorimotor deficits specifically delayed swallow initiation, decreased laryngeal elevation and closure resulting in deep penetration to vocal cords with inconsistent sensation. Reflexive and cued throat clear removed majority of penetrates. Mod-max vallecular and mild pyriform sinus residue (reduced sensation) with incomplete clearance with multiple swallows. Aspiration risk is high and recommending Dys 1 texture and honey thick liquids, swallow 2 times, cough after every other bites/sips with continued ST.   Swallow Evaluation Recommendations      SLP Diet Recommendations: Dysphagia 1 (Puree) solids;Honey thick liquids   Liquid Administration via: Cup;No straw   Medication Administration: Crushed with puree   Supervision: Patient able to self feed;Full supervision/cueing for compensatory strategies   Compensations: Minimize environmental  distractions;Slow rate;Small sips/bites   Postural Changes: Seated upright at 90 degrees;Remain semi-upright after after feeds/meals (Comment)   Oral Care Recommendations: Oral care BID  Discharge Instructions: Discharge Instructions    DIET - DYS 1    Complete by:  As directed    Fluid consistency:  Honey Thick   Diet - low sodium heart healthy    Complete by:  As directed    Increase activity slowly    Complete by:  As directed      Dysphagia 1 diet (puree) solids Honey thick liquids via cup (no straw) Meds crushed with purees Full supervision/cueing at meals Swallow twice after each bite Cough after every other bite/sip Minimize distractions, feed and slow rate, small sips/bites Seated upright at 90 degrees, remain semi-upright after after  feeds/meals  Oral care BID  Signed: Minus Liberty, MD 07/08/2016, 1:19 PM   Pager: [phone]

## 2016-07-08 NOTE — Progress Notes (Signed)
Nutrition Brief Note  Patient identified on the Malnutrition Screening Tool (MST) Report  Wt Readings from Last 15 Encounters:  07/07/16 175 lb (79.4 kg)  06/10/16 181 lb 8 oz (82.3 kg)  04/14/16 176 lb 9.6 oz (80.1 kg)  02/16/16 175 lb (79.4 kg)  01/20/16 175 lb (79.4 kg)  09/03/15 172 lb 9.6 oz (78.3 kg)  08/21/15 174 lb 6.4 oz (79.1 kg)  06/18/15 178 lb 14.4 oz (81.1 kg)  12/18/14 176 lb 11.2 oz (80.2 kg)  11/21/14 172 lb 8 oz (78.2 kg)  11/02/14 174 lb 14.4 oz (79.3 kg)  10/25/14 168 lb 10.4 oz (76.5 kg)  10/05/14 179 lb (81.2 kg)  09/27/14 179 lb 14.3 oz (81.6 kg)  08/30/14 184 lb (83.5 kg)   Todd [name] a 71 y.o.man with past medical history significant for traumatic brain injury, seizure, CAD, osteoporosis,Transferred from [facility], there he was sent from skilled nursing facility after failing barium swallow screening today.  Pt admitted with oropharyngeal dysphagia after failed MBSS.   Pt underwent MBSS today in house with SLP; pt was advanced to a dysphagia 1 diet with honey thick liquids (diet PTA was dysphagia 2 with nectar thick liqiuids). SLP recommending follow-up MBSS in one month to assess diet tolerance.   Per wt hx, wt has been stable over the past year.   Nutrition-Focused physical exam completed. Findings are no fat depletion, no muscle depletion, and no edema.   Per MD notes, plan to d/c back to SNF today.  Body mass index is 25.84 kg/m. Patient meets criteria for overweight based on current BMI.   Current diet order is dysphagia 1 diet with honey thick liquids, patient is consuming approximately n/a% of meals at this time. Labs and medications reviewed.   No nutrition interventions warranted at this time. If nutrition issues arise, please consult RD.   Rosilyn Coachman A. Jimmye Norman, RD, LDN, CDE Pager: [phone] After hours Pager: [phone]

## 2016-07-08 NOTE — Discharge Instructions (Signed)
Dysphagia 1 diet (puree) solids Honey thick liquids via cup (no straw) Meds crushed with purees Full supervision/cueing at meals Swallow twice after each bite Cough after every other bite/sip Minimize distractions, feed and slow rate, small sips/bites Seated upright at 90 degrees, remain semi-upright after after feeds/meals  Oral care BID

## 2016-07-09 DIAGNOSIS — Z87820 Personal history of traumatic brain injury: Secondary | ICD-10-CM | POA: Diagnosis not present

## 2016-07-09 DIAGNOSIS — R1310 Dysphagia, unspecified: Secondary | ICD-10-CM | POA: Diagnosis not present

## 2016-07-09 DIAGNOSIS — Z8673 Personal history of transient ischemic attack (TIA), and cerebral infarction without residual deficits: Secondary | ICD-10-CM | POA: Diagnosis not present

## 2016-07-09 DIAGNOSIS — G40909 Epilepsy, unspecified, not intractable, without status epilepticus: Secondary | ICD-10-CM | POA: Diagnosis not present

## 2016-07-09 NOTE — Consult Note (Signed)
           [facility] CM Primary Care Navigator  07/09/2016  Todd [name] 1945/10/19 [id number]    Went to see patient at the bedside today to identify possible discharge needs but staff reports that patient had been discharged.  Patient was discharged to back to skilled nursing facility ([facility]) where he was residing. Notified Burgess Amor, RN (post acute RN) for follow-up.  For questions, please contact:  Dannielle Huh, BSN, RN- [facility] Primary Care Navigator  Telephone: [phone] [place]

## 2016-07-09 NOTE — Progress Notes (Signed)
SLP addendum   07/08/16 1342  SLP G-Codes **NOT FOR INPATIENT CLASS**  Functional Assessment Tool Used skilled clincal judgement  Functional Limitations Swallowing  Swallow Current Status (D4446) CM  Swallow Goal Status (F9012) CL  Swallow Discharge Status (Q2411) CL  SLP Evaluations  $ SLP Speech Visit 1 Procedure  SLP Evaluations  $Swallowing Treatment 1 Procedure  Todd [name] M.Ed [employer] [phone]

## 2016-07-10 LAB — URINE CULTURE

## 2016-07-13 ENCOUNTER — Encounter: Payer: Self-pay | Admitting: Internal Medicine

## 2016-07-16 ENCOUNTER — Encounter: Payer: Self-pay | Admitting: *Deleted

## 2016-07-16 ENCOUNTER — Other Ambulatory Visit: Payer: Self-pay | Admitting: *Deleted

## 2016-07-16 DIAGNOSIS — Z8673 Personal history of transient ischemic attack (TIA), and cerebral infarction without residual deficits: Secondary | ICD-10-CM | POA: Diagnosis not present

## 2016-07-16 DIAGNOSIS — I2510 Atherosclerotic heart disease of native coronary artery without angina pectoris: Secondary | ICD-10-CM | POA: Diagnosis not present

## 2016-07-16 DIAGNOSIS — G40909 Epilepsy, unspecified, not intractable, without status epilepticus: Secondary | ICD-10-CM | POA: Diagnosis not present

## 2016-07-16 DIAGNOSIS — Z87820 Personal history of traumatic brain injury: Secondary | ICD-10-CM | POA: Diagnosis not present

## 2016-07-16 NOTE — Patient Outreach (Signed)
Met with SW at facility, patient set to discharge next week with [facility]. Caregiver is still in [place].  Call to patient caregiver, Theron Arista [phone]. She reports she has already engaged home care to start next week. Patient has VA benefits 2 hours of aide services daily.  She will get nutritionist and ST to assist her with diet ideas, as patient now on honey thick liquids. She has been patient caregiver for close to 30 years.   Reviewed [facility] program services. She does not want to engage at this time but requests information.   Plan to mail contact letter with magnet and brochure for future reference. Will sign off.  Royetta Crochet. Laymond Purser, RN, BSN, [place] [phone]) Business Cell  [phone]) Toll Free Office

## 2016-07-17 DIAGNOSIS — L0291 Cutaneous abscess, unspecified: Secondary | ICD-10-CM | POA: Diagnosis not present

## 2016-07-17 DIAGNOSIS — Z8673 Personal history of transient ischemic attack (TIA), and cerebral infarction without residual deficits: Secondary | ICD-10-CM | POA: Diagnosis not present

## 2016-07-17 DIAGNOSIS — J449 Chronic obstructive pulmonary disease, unspecified: Secondary | ICD-10-CM | POA: Diagnosis not present

## 2016-07-17 DIAGNOSIS — Z87820 Personal history of traumatic brain injury: Secondary | ICD-10-CM | POA: Diagnosis not present

## 2016-07-20 ENCOUNTER — Telehealth: Payer: Self-pay

## 2016-07-20 NOTE — Telephone Encounter (Signed)
Lm for rtc 

## 2016-07-20 NOTE — Telephone Encounter (Signed)
A new order is requested to resume PT, OT and speech HH services for [facility] He is continuing the honey thick and pureed diet

## 2016-07-20 NOTE — Telephone Encounter (Signed)
Please call pt wife back about home health orders.

## 2016-07-21 NOTE — Telephone Encounter (Signed)
Do they ned a verbal order or do I need to put in a new one?

## 2016-07-27 ENCOUNTER — Telehealth: Payer: Self-pay

## 2016-07-27 DIAGNOSIS — R1314 Dysphagia, pharyngoesophageal phase: Secondary | ICD-10-CM

## 2016-07-27 DIAGNOSIS — Z87820 Personal history of traumatic brain injury: Secondary | ICD-10-CM

## 2016-07-27 NOTE — Telephone Encounter (Signed)
Pt wife need to speak with a nurse about home health orders. Please call back.

## 2016-07-27 NOTE — Telephone Encounter (Signed)
Order faxed to [facility] - I will ask Chilon to f/u tomorrow.

## 2016-07-27 NOTE — Telephone Encounter (Signed)
Returned wife's call - stated Todd [name] has not received order for Home PT/OT/Speech. Dr Dareen Piano can u put in an new order? Thanks

## 2016-07-27 NOTE — Telephone Encounter (Signed)
I put in a new order. Let me know f there are any issues. Thanks

## 2016-07-29 ENCOUNTER — Telehealth: Payer: Self-pay | Admitting: *Deleted

## 2016-07-29 NOTE — Telephone Encounter (Signed)
Called [facility] about an hour ago - stated they did not see referral which was faxed by [facility]. Explained to the person I was speaking to the referral was faxed on Monday and again this morning and now pt's wife is becoming upset. He transferred me to [place] - talked to Hetty Ely. Stated to re-fax order to [phone] attn to her.  I called back to see if fax was received. The person I spoke to stated Hetty Ely had received it and she will forward it to the correct dept.

## 2016-07-29 NOTE — Telephone Encounter (Signed)
Call from pt's wife - stated she had called [facility] this morning, was told they have not   referral for PT/OT. I faxed referral on Monday w/confirmation. Called [facility] [phone], stated he did see referral. Chilon re-faxed referral again w/confirmation; I will call [facility] back in about an hour.

## 2016-07-29 NOTE — Telephone Encounter (Signed)
Called pt's wife to explain what has been done; stated thank-you. Told her if she does not heat from [facility] to give [place] a call back.

## 2016-07-30 ENCOUNTER — Encounter (HOSPITAL_COMMUNITY): Payer: Self-pay | Admitting: *Deleted

## 2016-07-30 ENCOUNTER — Emergency Department (HOSPITAL_COMMUNITY): Payer: Medicare HMO

## 2016-07-30 ENCOUNTER — Inpatient Hospital Stay (HOSPITAL_COMMUNITY)
Admission: EM | Admit: 2016-07-30 | Discharge: 2016-08-02 | DRG: 871 | Disposition: A | Discharge status: Home Health | Payer: Medicare HMO | Attending: Oncology | Admitting: Oncology

## 2016-07-30 DIAGNOSIS — Z886 Allergy status to analgesic agent status: Secondary | ICD-10-CM | POA: Diagnosis not present

## 2016-07-30 DIAGNOSIS — Z87820 Personal history of traumatic brain injury: Secondary | ICD-10-CM | POA: Diagnosis not present

## 2016-07-30 DIAGNOSIS — G40909 Epilepsy, unspecified, not intractable, without status epilepticus: Secondary | ICD-10-CM | POA: Diagnosis present

## 2016-07-30 DIAGNOSIS — R269 Unspecified abnormalities of gait and mobility: Secondary | ICD-10-CM | POA: Diagnosis not present

## 2016-07-30 DIAGNOSIS — Z7902 Long term (current) use of antithrombotics/antiplatelets: Secondary | ICD-10-CM | POA: Diagnosis not present

## 2016-07-30 DIAGNOSIS — Z79899 Other long term (current) drug therapy: Secondary | ICD-10-CM

## 2016-07-30 DIAGNOSIS — Z8673 Personal history of transient ischemic attack (TIA), and cerebral infarction without residual deficits: Secondary | ICD-10-CM | POA: Diagnosis not present

## 2016-07-30 DIAGNOSIS — Z8249 Family history of ischemic heart disease and other diseases of the circulatory system: Secondary | ICD-10-CM

## 2016-07-30 DIAGNOSIS — Z87891 Personal history of nicotine dependence: Secondary | ICD-10-CM | POA: Diagnosis not present

## 2016-07-30 DIAGNOSIS — R404 Transient alteration of awareness: Secondary | ICD-10-CM | POA: Diagnosis not present

## 2016-07-30 DIAGNOSIS — A419 Sepsis, unspecified organism: Secondary | ICD-10-CM | POA: Diagnosis not present

## 2016-07-30 DIAGNOSIS — Z8701 Personal history of pneumonia (recurrent): Secondary | ICD-10-CM | POA: Diagnosis present

## 2016-07-30 DIAGNOSIS — K219 Gastro-esophageal reflux disease without esophagitis: Secondary | ICD-10-CM | POA: Diagnosis present

## 2016-07-30 DIAGNOSIS — Z87440 Personal history of urinary (tract) infections: Secondary | ICD-10-CM | POA: Diagnosis not present

## 2016-07-30 DIAGNOSIS — Z888 Allergy status to other drugs, medicaments and biological substances status: Secondary | ICD-10-CM | POA: Diagnosis not present

## 2016-07-30 DIAGNOSIS — R51 Headache: Secondary | ICD-10-CM | POA: Diagnosis present

## 2016-07-30 DIAGNOSIS — G8929 Other chronic pain: Secondary | ICD-10-CM | POA: Diagnosis present

## 2016-07-30 DIAGNOSIS — Z7951 Long term (current) use of inhaled steroids: Secondary | ICD-10-CM | POA: Diagnosis not present

## 2016-07-30 DIAGNOSIS — I10 Essential (primary) hypertension: Secondary | ICD-10-CM | POA: Diagnosis present

## 2016-07-30 DIAGNOSIS — R918 Other nonspecific abnormal finding of lung field: Secondary | ICD-10-CM | POA: Diagnosis not present

## 2016-07-30 DIAGNOSIS — J449 Chronic obstructive pulmonary disease, unspecified: Secondary | ICD-10-CM | POA: Diagnosis not present

## 2016-07-30 DIAGNOSIS — Z993 Dependence on wheelchair: Secondary | ICD-10-CM | POA: Diagnosis not present

## 2016-07-30 DIAGNOSIS — Z7982 Long term (current) use of aspirin: Secondary | ICD-10-CM

## 2016-07-30 DIAGNOSIS — R5381 Other malaise: Secondary | ICD-10-CM | POA: Diagnosis not present

## 2016-07-30 DIAGNOSIS — R0902 Hypoxemia: Secondary | ICD-10-CM | POA: Diagnosis present

## 2016-07-30 DIAGNOSIS — R4182 Altered mental status, unspecified: Secondary | ICD-10-CM | POA: Diagnosis not present

## 2016-07-30 DIAGNOSIS — I2510 Atherosclerotic heart disease of native coronary artery without angina pectoris: Secondary | ICD-10-CM | POA: Diagnosis not present

## 2016-07-30 DIAGNOSIS — Z87310 Personal history of (healed) osteoporosis fracture: Secondary | ICD-10-CM | POA: Diagnosis not present

## 2016-07-30 DIAGNOSIS — Z885 Allergy status to narcotic agent status: Secondary | ICD-10-CM | POA: Diagnosis not present

## 2016-07-30 DIAGNOSIS — R509 Fever, unspecified: Secondary | ICD-10-CM | POA: Diagnosis present

## 2016-07-30 DIAGNOSIS — J189 Pneumonia, unspecified organism: Secondary | ICD-10-CM

## 2016-07-30 DIAGNOSIS — Z85828 Personal history of other malignant neoplasm of skin: Secondary | ICD-10-CM | POA: Diagnosis not present

## 2016-07-30 DIAGNOSIS — I69398 Other sequelae of cerebral infarction: Secondary | ICD-10-CM | POA: Diagnosis not present

## 2016-07-30 DIAGNOSIS — M810 Age-related osteoporosis without current pathological fracture: Secondary | ICD-10-CM | POA: Diagnosis not present

## 2016-07-30 DIAGNOSIS — R55 Syncope and collapse: Secondary | ICD-10-CM | POA: Diagnosis not present

## 2016-07-30 DIAGNOSIS — Z8619 Personal history of other infectious and parasitic diseases: Secondary | ICD-10-CM | POA: Diagnosis not present

## 2016-07-30 DIAGNOSIS — J690 Pneumonitis due to inhalation of food and vomit: Secondary | ICD-10-CM | POA: Diagnosis not present

## 2016-07-30 DIAGNOSIS — G40509 Epileptic seizures related to external causes, not intractable, without status epilepticus: Secondary | ICD-10-CM | POA: Diagnosis not present

## 2016-07-30 DIAGNOSIS — J9600 Acute respiratory failure, unspecified whether with hypoxia or hypercapnia: Secondary | ICD-10-CM | POA: Diagnosis not present

## 2016-07-30 LAB — CBC
HEMATOCRIT: 44.9 % (ref 39.0–52.0)
Hemoglobin: 14.7 g/dL (ref 13.0–17.0)
MCH: 33.2 pg (ref 26.0–34.0)
MCHC: 32.7 g/dL (ref 30.0–36.0)
MCV: 101.4 fL — ABNORMAL HIGH (ref 78.0–100.0)
PLATELETS: 248 10*3/uL (ref 150–400)
RBC: 4.43 MIL/uL (ref 4.22–5.81)
RDW: 13.8 % (ref 11.5–15.5)
WBC: 12.4 10*3/uL — AB (ref 4.0–10.5)

## 2016-07-30 LAB — BASIC METABOLIC PANEL
ANION GAP: 10 (ref 5–15)
BUN: 14 mg/dL (ref 6–20)
CO2: 26 mmol/L (ref 22–32)
Calcium: 9.3 mg/dL (ref 8.9–10.3)
Chloride: 108 mmol/L (ref 101–111)
Creatinine, Ser: 0.93 mg/dL (ref 0.61–1.24)
GFR calc Af Amer: >60 mL/min (ref >60)
GLUCOSE: 111 mg/dL — AB (ref 65–99)
POTASSIUM: 4.1 mmol/L (ref 3.5–5.1)
SODIUM: 144 mmol/L (ref 135–145)

## 2016-07-30 LAB — CBG MONITORING, ED: Glucose-Capillary: 110 mg/dL — ABNORMAL HIGH (ref 65–99)

## 2016-07-30 LAB — URINALYSIS, ROUTINE W REFLEX MICROSCOPIC
Bilirubin Urine: NEGATIVE
Glucose, UA: NEGATIVE mg/dL
Hgb urine dipstick: NEGATIVE
KETONES UR: NEGATIVE mg/dL
LEUKOCYTES UA: NEGATIVE
NITRITE: NEGATIVE
PH: 6 (ref 5.0–8.0)
Protein, ur: NEGATIVE mg/dL
Specific Gravity, Urine: 1.018 (ref 1.005–1.030)

## 2016-07-30 MED ORDER — FAMOTIDINE 20 MG PO TABS
20.0000 mg | ORAL_TABLET | Freq: Every day | ORAL | Status: DC
  Administered 2016-07-31 – 2016-08-02 (×3): 20 mg via ORAL
  Filled 2016-07-30 (×3): qty 1

## 2016-07-30 MED ORDER — SENNOSIDES-DOCUSATE SODIUM 8.6-50 MG PO TABS: 1.0000 | ORAL_TABLET | Freq: Every evening | ORAL | Status: DC | PRN

## 2016-07-30 MED ORDER — ACETAMINOPHEN 325 MG PO TABS: 650.0000 mg | ORAL_TABLET | Freq: Once | ORAL | Status: DC

## 2016-07-30 MED ORDER — DEXTROSE 5 % IV SOLN
1.0000 g | Freq: Three times a day (TID) | INTRAVENOUS | Status: DC
  Filled 2016-07-30 (×2): qty 1

## 2016-07-30 MED ORDER — KETOTIFEN FUMARATE 0.025 % OP SOLN
1.0000 [drp] | Freq: Two times a day (BID) | OPHTHALMIC | Status: DC
  Administered 2016-07-31 – 2016-08-01 (×3): 1 [drp] via OPHTHALMIC
  Filled 2016-07-30 (×2): qty 5

## 2016-07-30 MED ORDER — DEXTROSE 5 % IV SOLN: 500.0000 mg | Freq: Once | INTRAVENOUS | Status: DC

## 2016-07-30 MED ORDER — SODIUM CHLORIDE 0.9% FLUSH
3.0000 mL | Freq: Two times a day (BID) | INTRAVENOUS | Status: DC
  Administered 2016-07-31 (×2): 3 mL via INTRAVENOUS

## 2016-07-30 MED ORDER — ISOSORBIDE MONONITRATE ER 30 MG PO TB24
30.0000 mg | ORAL_TABLET | Freq: Every day | ORAL | Status: DC
Start: 2016-07-31 — End: 2016-08-02
  Administered 2016-07-31 – 2016-08-02 (×3): 30 mg via ORAL
  Filled 2016-07-30 (×3): qty 1

## 2016-07-30 MED ORDER — GUAIFENESIN 200 MG PO TABS
400.0000 mg | ORAL_TABLET | Freq: Two times a day (BID) | ORAL | Status: DC
  Administered 2016-07-31 – 2016-08-02 (×5): 400 mg via ORAL
  Filled 2016-07-30 (×5): qty 2

## 2016-07-30 MED ORDER — DEXTROSE 5 % IV SOLN: 2.0000 g | Freq: Once | INTRAVENOUS | Status: DC

## 2016-07-30 MED ORDER — SODIUM CHLORIDE 0.9 % IV SOLN
INTRAVENOUS | Status: AC
  Administered 2016-07-31: via INTRAVENOUS

## 2016-07-30 MED ORDER — VANCOMYCIN HCL 10 G IV SOLR
1500.0000 mg | Freq: Once | INTRAVENOUS | Status: DC
  Filled 2016-07-30: qty 1500

## 2016-07-30 MED ORDER — HYDROCORTISONE 1 % EX CREA
TOPICAL_CREAM | Freq: Two times a day (BID) | CUTANEOUS | Status: DC
  Administered 2016-07-31: 10:00:00 via TOPICAL
  Administered 2016-07-31: 1 via TOPICAL
  Administered 2016-08-01: 22:00:00 via TOPICAL
  Filled 2016-07-30: qty 28

## 2016-07-30 MED ORDER — ENOXAPARIN SODIUM 40 MG/0.4ML ~~LOC~~ SOLN
40.0000 mg | SUBCUTANEOUS | Status: DC
  Administered 2016-07-31 – 2016-08-01 (×2): 40 mg via SUBCUTANEOUS
  Filled 2016-07-30 (×3): qty 0.4

## 2016-07-30 MED ORDER — DEXTROSE 5 % IV SOLN: 1.0000 g | Freq: Once | INTRAVENOUS | Status: DC

## 2016-07-30 MED ORDER — ASPIRIN 81 MG PO CHEW
81.0000 mg | CHEWABLE_TABLET | Freq: Every day | ORAL | Status: DC
Start: 2016-07-31 — End: 2016-08-02
  Administered 2016-07-31 – 2016-08-02 (×3): 81 mg via ORAL
  Filled 2016-07-30 (×3): qty 1

## 2016-07-30 MED ORDER — FLUTICASONE PROPIONATE 50 MCG/ACT NA SUSP
1.0000 | Freq: Every day | NASAL | Status: DC
Start: 2016-07-31 — End: 2016-08-02
  Administered 2016-07-31 – 2016-08-02 (×3): 1 via NASAL
  Filled 2016-07-30: qty 16

## 2016-07-30 MED ORDER — SODIUM CHLORIDE 0.9 % IV BOLUS (SEPSIS)
1000.0000 mL | Freq: Once | INTRAVENOUS | Status: AC
  Administered 2016-07-30: 1000 mL via INTRAVENOUS

## 2016-07-30 MED ORDER — LEVOCETIRIZINE DIHYDROCHLORIDE 5 MG PO TABS: 5.0000 mg | ORAL_TABLET | Freq: Every evening | ORAL | Status: DC

## 2016-07-30 MED ORDER — LIDOCAINE 5 % EX PTCH
1.0000 | MEDICATED_PATCH | CUTANEOUS | Status: DC
Start: 2016-07-31 — End: 2016-08-02
  Administered 2016-07-31 – 2016-08-02 (×3): 1 via TRANSDERMAL
  Filled 2016-07-30 (×3): qty 1

## 2016-07-30 MED ORDER — ATORVASTATIN CALCIUM 80 MG PO TABS
80.0000 mg | ORAL_TABLET | Freq: Every day | ORAL | Status: DC
  Administered 2016-07-31 – 2016-08-01 (×2): 80 mg via ORAL
  Filled 2016-07-30 (×2): qty 1

## 2016-07-30 MED ORDER — DEXTROSE 5 % IV SOLN
1.0000 g | Freq: Once | INTRAVENOUS | Status: DC
  Filled 2016-07-30: qty 1

## 2016-07-30 MED ORDER — SODIUM CHLORIDE 0.9 % IV SOLN
3.0000 g | Freq: Four times a day (QID) | INTRAVENOUS | Status: DC
  Administered 2016-07-31 – 2016-08-02 (×10): 3 g via INTRAVENOUS
  Filled 2016-07-30 (×14): qty 3

## 2016-07-30 MED ORDER — ALBUTEROL SULFATE (2.5 MG/3ML) 0.083% IN NEBU: 2.5000 mg | INHALATION_SOLUTION | RESPIRATORY_TRACT | Status: DC | PRN

## 2016-07-30 MED ORDER — VANCOMYCIN HCL IN DEXTROSE 1-5 GM/200ML-% IV SOLN: 1000.0000 mg | Freq: Two times a day (BID) | INTRAVENOUS | Status: DC

## 2016-07-30 MED ORDER — OXYCODONE HCL 5 MG PO TABS
2.5000 mg | ORAL_TABLET | Freq: Two times a day (BID) | ORAL | Status: DC | PRN
  Administered 2016-08-01 – 2016-08-02 (×2): 5 mg via ORAL
  Filled 2016-07-30 (×2): qty 1

## 2016-07-30 MED ORDER — CLOPIDOGREL BISULFATE 75 MG PO TABS
75.0000 mg | ORAL_TABLET | Freq: Every day | ORAL | Status: DC
  Administered 2016-07-31 – 2016-08-02 (×3): 75 mg via ORAL
  Filled 2016-07-30 (×3): qty 1

## 2016-07-30 MED ORDER — LAMOTRIGINE 25 MG PO TABS
50.0000 mg | ORAL_TABLET | Freq: Two times a day (BID) | ORAL | Status: DC
  Administered 2016-07-31 – 2016-08-02 (×6): 50 mg via ORAL
  Filled 2016-07-30 (×7): qty 2

## 2016-07-30 NOTE — ED Notes (Signed)
Pt's brief changed, pt cleaned and new brief in place.

## 2016-07-30 NOTE — Progress Notes (Signed)
Pharmacy Antibiotic Note  Todd [name] is a 71 y.o. male admitted on 07/30/2016 with HCAP.  Pharmacy has been consulted for vancomycin and cefepime dosing. Renal function wnl.  Vancomycin trough goal 15-20  Plan: 1) Vancomycin 1500mg  IV x 1 then 1g IV q12 2) Cefepime 2g IV x 1 then 1g IV q8 3) Follow renal function, cultures, LOT, level if needed  Weight: 175 lb 0.7 oz (79.4 kg)  Temp (24hrs), Avg:99.6 F (37.6 C), Min:98.6 F (37 C), Max:100.6 F (38.1 C)   Recent Labs Lab 07/30/16 2003  WBC 12.4*  CREATININE 0.93    Estimated Creatinine Clearance: 76 mL/min (by C-G formula based on SCr of 0.93 mg/dL).    Allergies  Allergen Reactions  . Acetaminophen Other (See Comments)    Pt is unable to take this medication due to liver disease.    Tori Milks [Naproxen] Other (See Comments)    Pt is unable to take this medication due to liver disease.    . Beta Adrenergic Blockers Other (See Comments)    Reaction:  Disorientation   . Codeine Hives and Other (See Comments)    Reaction:  Inflammation  . Metoprolol Other (See Comments)    Reaction:  Dizziness   . Nsaids Other (See Comments)    Pt is unable to take this medication due to liver disease.      Antimicrobials this admission: 4/5 Vancomycin >> 4/5 Cefepime >>  Dose adjustments this admission: n/a  Microbiology results: None yet  Thank you for allowing pharmacy to be a part of this patient's care.  Deboraha Sprang 07/30/2016 10:41 PM

## 2016-07-30 NOTE — ED Notes (Signed)
Pt took 2.5mg  oxycodone of his own medication for chronic back pain

## 2016-07-30 NOTE — ED Notes (Signed)
Dr Dayna Barker in room
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
## 2016-07-30 NOTE — ED Notes (Signed)
Internal med at bedside.

## 2016-07-30 NOTE — ED Notes (Signed)
This RN spoke with Dr Randell Patient about tylenol order. Pt's wife stated that he could not have tylenol or nsaids due to pt having liver problems. Dr Randell Patient ordered to just keep check on temperature.
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
## 2016-07-30 NOTE — ED Triage Notes (Signed)
Pt to ED by EMS after wife found pt slumped over on the toilet. Pt has a hx of TBI and seizures. Pt recently discharged back home from [facility] facility for aspiration and received PT/OT and speech therapy. Per wife, pt had a growling, was unresponsive on the toilet for a couple of minutes. Pt had an episode very similar 4 months ago with negative workup. Pt sats 88% on room air, pt's had a trach in the past and has issues with epiglottis. Pt follows commands and is alert.

## 2016-07-31 DIAGNOSIS — Z87891 Personal history of nicotine dependence: Secondary | ICD-10-CM

## 2016-07-31 DIAGNOSIS — A419 Sepsis, unspecified organism: Secondary | ICD-10-CM | POA: Diagnosis present

## 2016-07-31 DIAGNOSIS — Z888 Allergy status to other drugs, medicaments and biological substances status: Secondary | ICD-10-CM | POA: Diagnosis not present

## 2016-07-31 DIAGNOSIS — Z8673 Personal history of transient ischemic attack (TIA), and cerebral infarction without residual deficits: Secondary | ICD-10-CM | POA: Diagnosis not present

## 2016-07-31 DIAGNOSIS — Z7951 Long term (current) use of inhaled steroids: Secondary | ICD-10-CM | POA: Diagnosis not present

## 2016-07-31 DIAGNOSIS — Z87440 Personal history of urinary (tract) infections: Secondary | ICD-10-CM

## 2016-07-31 DIAGNOSIS — J690 Pneumonitis due to inhalation of food and vomit: Secondary | ICD-10-CM | POA: Diagnosis present

## 2016-07-31 DIAGNOSIS — G40509 Epileptic seizures related to external causes, not intractable, without status epilepticus: Secondary | ICD-10-CM

## 2016-07-31 DIAGNOSIS — Z85828 Personal history of other malignant neoplasm of skin: Secondary | ICD-10-CM | POA: Diagnosis not present

## 2016-07-31 DIAGNOSIS — J449 Chronic obstructive pulmonary disease, unspecified: Secondary | ICD-10-CM

## 2016-07-31 DIAGNOSIS — Z8619 Personal history of other infectious and parasitic diseases: Secondary | ICD-10-CM | POA: Diagnosis not present

## 2016-07-31 DIAGNOSIS — R509 Fever, unspecified: Secondary | ICD-10-CM | POA: Diagnosis present

## 2016-07-31 DIAGNOSIS — Z7982 Long term (current) use of aspirin: Secondary | ICD-10-CM | POA: Diagnosis not present

## 2016-07-31 DIAGNOSIS — I69398 Other sequelae of cerebral infarction: Secondary | ICD-10-CM | POA: Diagnosis not present

## 2016-07-31 DIAGNOSIS — M810 Age-related osteoporosis without current pathological fracture: Secondary | ICD-10-CM | POA: Diagnosis present

## 2016-07-31 DIAGNOSIS — Z87820 Personal history of traumatic brain injury: Secondary | ICD-10-CM

## 2016-07-31 DIAGNOSIS — Z79899 Other long term (current) drug therapy: Secondary | ICD-10-CM | POA: Diagnosis not present

## 2016-07-31 DIAGNOSIS — Z885 Allergy status to narcotic agent status: Secondary | ICD-10-CM

## 2016-07-31 DIAGNOSIS — R51 Headache: Secondary | ICD-10-CM | POA: Diagnosis present

## 2016-07-31 DIAGNOSIS — Z7902 Long term (current) use of antithrombotics/antiplatelets: Secondary | ICD-10-CM | POA: Diagnosis not present

## 2016-07-31 DIAGNOSIS — G40909 Epilepsy, unspecified, not intractable, without status epilepticus: Secondary | ICD-10-CM | POA: Diagnosis present

## 2016-07-31 DIAGNOSIS — Z886 Allergy status to analgesic agent status: Secondary | ICD-10-CM

## 2016-07-31 DIAGNOSIS — I2510 Atherosclerotic heart disease of native coronary artery without angina pectoris: Secondary | ICD-10-CM

## 2016-07-31 DIAGNOSIS — Z8249 Family history of ischemic heart disease and other diseases of the circulatory system: Secondary | ICD-10-CM

## 2016-07-31 DIAGNOSIS — I10 Essential (primary) hypertension: Secondary | ICD-10-CM

## 2016-07-31 DIAGNOSIS — K219 Gastro-esophageal reflux disease without esophagitis: Secondary | ICD-10-CM | POA: Diagnosis present

## 2016-07-31 DIAGNOSIS — Z87310 Personal history of (healed) osteoporosis fracture: Secondary | ICD-10-CM

## 2016-07-31 DIAGNOSIS — G8929 Other chronic pain: Secondary | ICD-10-CM | POA: Diagnosis present

## 2016-07-31 DIAGNOSIS — R0902 Hypoxemia: Secondary | ICD-10-CM | POA: Diagnosis present

## 2016-07-31 LAB — CBC
HCT: 40.6 % (ref 39.0–52.0)
Hemoglobin: 12.9 g/dL — ABNORMAL LOW (ref 13.0–17.0)
MCH: 32 pg (ref 26.0–34.0)
MCHC: 31.8 g/dL (ref 30.0–36.0)
MCV: 100.7 fL — AB (ref 78.0–100.0)
PLATELETS: 222 10*3/uL (ref 150–400)
RBC: 4.03 MIL/uL — AB (ref 4.22–5.81)
RDW: 13.9 % (ref 11.5–15.5)
WBC: 15.4 10*3/uL — ABNORMAL HIGH (ref 4.0–10.5)

## 2016-07-31 LAB — BASIC METABOLIC PANEL
Anion gap: 10 (ref 5–15)
BUN: 14 mg/dL (ref 6–20)
CHLORIDE: 105 mmol/L (ref 101–111)
CO2: 23 mmol/L (ref 22–32)
CREATININE: 0.84 mg/dL (ref 0.61–1.24)
Calcium: 8.5 mg/dL — ABNORMAL LOW (ref 8.9–10.3)
GFR calc non Af Amer: >60 mL/min (ref >60)
Glucose, Bld: 127 mg/dL — ABNORMAL HIGH (ref 65–99)
Potassium: 3.9 mmol/L (ref 3.5–5.1)
Sodium: 138 mmol/L (ref 135–145)

## 2016-07-31 MED ORDER — RESOURCE THICKENUP CLEAR PO POWD
ORAL | Status: DC | PRN
  Filled 2016-07-31: qty 125

## 2016-07-31 MED ORDER — LORATADINE 10 MG PO TABS
10.0000 mg | ORAL_TABLET | Freq: Every day | ORAL | Status: DC
  Administered 2016-07-31 – 2016-08-02 (×3): 10 mg via ORAL
  Filled 2016-07-31 (×3): qty 1
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
## 2016-07-31 NOTE — Progress Notes (Signed)
[facility]  Patient Status: Active (receiving services up to time of hospitalization)  [facility] is providing the following services: PT  Would benefit from RN also. Please order if you agree. Thanks!  If patient discharges after hours, please call [phone].   [name] 07/31/2016, 11:59 AM
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
## 2016-07-31 NOTE — Evaluation (Signed)
Occupational Therapy Evaluation Patient Details Name: Todd [name] MRN: [id number] DOB: 1946-01-09 Today's Date: 07/31/2016    History of Present Illness Pt with episod eof sycope, HCAP. Pt to ED by EMS after wife found pt slumped over on the toilet. Pt has a hx of TBI and seizures. Pt recently discharged back home from [facility] for aspiration and received PT/OT and speech therapy   Clinical Impression   Pt present with deficits in strength, balance, endurance and with hx of cognitive impairments for TBI in 1991. Uncertain how much assist pt's wife provided him at home and if ADLs at baseline or not. Pt able to feed self, however is impulsive with too large of portions on fork and spoon. Per chart pt previously used RW for short distances and required assist with ADLs. Pt will need + 2 assist with mobility at this time. OT will follow acutely to to address impairments to increase level of function and safety for pt/caregiver    Follow Up Recommendations  Supervision/Assistance - 24 hour (may need SNF depending on level of care wife can provide at home)    Equipment Recommendations  None recommended by OT    Recommendations for Other Services       Precautions / Restrictions Precautions Precautions: Fall Restrictions Weight Bearing Restrictions: No      Mobility Bed Mobility Overal bed mobility: Needs Assistance Bed Mobility: Supine to Sit;Sit to Supine     Supine to sit: Mod assist Sit to supine: Mod assist   General bed mobility comments: Physical prompts to slide LEs towards EOB, mod A to elevate trunk to sit EOB, mod A with LEs back onto bed. Pt able to scoot to [facility] with physical cues to use rails   Transfers                 General transfer comment: NT. Pt initiating returning  to supine position from EOB sitting in bed and did not want to attmept any transfers with OT and RN    Balance Overall balance assessment: Needs  assistance Sitting-balance support: Bilateral upper extremity supported;Feet supported Sitting balance-Leahy Scale: Fair                                     ADL either performed or assessed with clinical judgement   ADL Overall ADL's : Needs assistance/impaired Eating/Feeding: Minimal assistance;Sitting;Bed level;Cueing for safety Eating/Feeding Details (indicate cue type and reason): min A for safe portion sizes, speed Grooming: Sitting;Minimal assistance Grooming Details (indicate cue type and reason): cues for initiation and completion Upper Body Bathing: Sitting;Moderate assistance Upper Body Bathing Details (indicate cue type and reason): simulated Lower Body Bathing: Maximal assistance Lower Body Bathing Details (indicate cue type and reason): simulated Upper Body Dressing : Moderate assistance;Sitting Upper Body Dressing Details (indicate cue type and reason): simulated Lower Body Dressing: Total assistance     Toilet Transfer Details (indicate cue type and reason): Pt initiating returning  to supine position from EOB sitting in bed and did not want to attmept any transfers with OT and RN Toileting- Clothing Manipulation and Hygiene: Bed level;Total assistance       Functional mobility during ADLs: +2 for physical assistance;+2 for safety/equipment General ADL Comments: Pt requires assist with ADLs at baseline, however uncertain of how much assist at home he required or if his wife had an aide to help her with pt  Vision Patient Visual Report: No change from baseline (unable to properly assess)                  Pertinent Vitals/Pain Pain Assessment: No/denies pain     Hand Dominance Right   Extremity/Trunk Assessment Upper Extremity Assessment Upper Extremity Assessment: Generalized weakness;LUE deficits/detail;RUE deficits/detail RUE Deficits / Details: decreased tone, slow movements. AROM WFL RUE Coordination: decreased fine motor LUE  Deficits / Details: decreased tone, slow movements. AROM WFL LUE Coordination: decreased fine motor   Lower Extremity Assessment Lower Extremity Assessment: Defer to PT evaluation       Communication Communication Communication: Expressive difficulties   Cognition Arousal/Alertness: Awake/alert Behavior During Therapy: Impulsive Overall Cognitive Status: No family/caregiver present to determine baseline cognitive functioning                                     General Comments   pt pleasant and cooperative               Home Living   Living Arrangements: Spouse/significant other                                      Prior Functioning/Environment Level of Independence: Needs assistance  Gait / Transfers Assistance Needed: Per chart, pt walks short distances with RW ADL's / Homemaking Assistance Needed: Pt requires max A with ADLs/selfcare. Can feed self, however required sup and cues to safely eat portions per RN discussion with pt's wife            OT Problem List: Impaired tone;Decreased cognition;Impaired balance (sitting and/or standing);Decreased activity tolerance;Decreased strength;Decreased safety awareness      OT Treatment/Interventions: Self-care/ADL training;Therapeutic activities;Balance training;Patient/family education;Therapeutic exercise;Neuromuscular education    OT Goals(Current goals can be found in the care plan section) Acute Rehab OT Goals Patient Stated Goal: none stated OT Goal Formulation: Patient unable to participate in goal setting Time For Goal Achievement: 08/07/16 Potential to Achieve Goals: Good ADL Goals Pt Will Perform Eating: with supervision;with caregiver independent in assisting;sitting Pt Will Perform Grooming: with min guard assist;with supervision;with set-up;sitting Pt Will Perform Upper Body Bathing: with min assist;sitting;with caregiver independent in assisting Pt Will Perform Upper Body  Dressing: with min assist;with caregiver independent in assisting;sitting Pt Will Transfer to Toilet: with +2 assist;bedside commode;with mod assist;with max assist Additional ADL Goal #1: pt will complete bed mobility with min A to sit EOB for grooming and UB ADL tasks  OT Frequency: Min 2X/week   Barriers to D/C:    uncertain how much assist pt's wife was able to provide at home                     End of Session    Activity Tolerance: Patient limited by fatigue Patient left: in bed;with call bell/phone within reach;with bed alarm set  OT Visit Diagnosis: Muscle weakness (generalized) (M62.81);Feeding difficulties (R63.3);Other symptoms and signs involving cognitive function;Other symptoms and signs involving the nervous system (R29.898)                Time: 1026-1050 OT Time Calculation (min): 24 min Charges:  OT General Charges $OT Visit: 1 Procedure OT Evaluation $OT Eval Moderate Complexity: 1 Procedure OT Treatments $Therapeutic Activity: 8-22 mins G-Codes: OT G-codes **NOT FOR INPATIENT CLASS** Functional Assessment Tool Used: AM-PAC 6  Clicks Daily Activity;Clinical judgement Functional Limitation: Self care Self Care Current Status [phone]): At least 60 percent but less than 80 percent impaired, limited or restricted Self Care Goal Status (X5284): At least 40 percent but less than 60 percent impaired, limited or restricted     Britt Bottom 07/31/2016, 11:55 AM

## 2016-07-31 NOTE — Evaluation (Signed)
Physical Therapy Evaluation Patient Details Name: Todd [name] MRN: [id number] DOB: January 29, 1946 Today's Date: 07/31/2016   History of Present Illness  Pt with episod of syncope, HCAP. Pt to ED by EMS after wife found pt slumped over on the toilet. Pt has a hx of TBI and seizures. Pt recently discharged back home from [facility] for aspiration and received PT/OT and speech therapy  Clinical Impression  Pt admitted with above diagnosis. Pt currently with functional limitations due to the deficits listed below (see PT Problem List). Pt was able to stand pivot with chair with +2 mod assist with RW.  IF wife can assist pt 24 hours and at times provide mod assist, pt may be able to go home.  Otherwise, will need SNF for therapy.   Pt will benefit from skilled PT to increase their independence and safety with mobility to allow discharge to the venue listed below.      Follow Up Recommendations SNF;Supervision/Assistance - 24 hour (If wife can provide 24 hour assist, HHPT ok.)    Equipment Recommendations  None recommended by PT    Recommendations for Other Services       Precautions / Restrictions Precautions Precautions: Fall Restrictions Weight Bearing Restrictions: No      Mobility  Bed Mobility Overal bed mobility: Needs Assistance Bed Mobility: Supine to Sit;Sit to Supine     Supine to sit: Mod assist;Max assist;+2 for physical assistance Sit to supine: Mod assist   General bed mobility comments: Physical prompts to slide LEs towards EOB, max assist to elevate trunk to sit EOB, mod A with LEs to EOB and back onto bed. Total assist to scoot to EOB.  Transfers Overall transfer level: Needs assistance Equipment used: Rolling walker (2 wheeled) Transfers: Sit to/from [facility] Sit to Stand: Mod assist;Max assist;+2 physical assistance Stand pivot transfers: Mod assist;+2 physical assistance       General transfer comment: Pt needed  assist to power up. Once up somewhat flexed posture but was standing with RW.  Able to take steps to recliner but was keeping RW too far in front of him. Cues and assist to step around to recliner with assist to move RW as well.   Ambulation/Gait                Stairs            Wheelchair Mobility    Modified Rankin (Stroke Patients Only)       Balance Overall balance assessment: Needs assistance Sitting-balance support: Feet supported;Bilateral upper extremity supported Sitting balance-Leahy Scale: Poor Sitting balance - Comments: Was needing UE support for balance.  Postural control: Posterior lean Standing balance support: Bilateral upper extremity supported;During functional activity Standing balance-Leahy Scale: Poor Standing balance comment: Pt was able to stand but relying on Ue support with poor postural stability.              High level balance activites: Backward walking;Turns High Level Balance Comments: Needed mod assist for stability.              Pertinent Vitals/Pain Pain Assessment: No/denies pain    Home Living Family/patient expects to be discharged to:: Private residence Living Arrangements: Spouse/significant other Available Help at Discharge: Family Type of Home: House Home Access: Stairs to enter Entrance Stairs-Rails: Psychiatric nurse of Steps: 9 + 7 Home Layout: One level Home Equipment: Walker - 2 wheels;Bedside commode;Wheelchair - manual;Shower seat      Prior Function Level of  Independence: Needs assistance   Gait / Transfers Assistance Needed: Per chart, pt walks short distances with RW  ADL's / Homemaking Assistance Needed: Pt requires max A with ADLs/selfcare. Can feed self, however required sup and cues to safely eat portions per RN discussion with pt's wife        Hand Dominance   Dominant Hand: Right    Extremity/Trunk Assessment   Upper Extremity Assessment Upper Extremity Assessment:  Defer to OT evaluation RUE Deficits / Details: decreased tone, slow movements. AROM WFL RUE Coordination: decreased fine motor LUE Deficits / Details: decreased tone, slow movements. AROM WFL LUE Coordination: decreased fine motor    Lower Extremity Assessment Lower Extremity Assessment: RLE deficits/detail;LLE deficits/detail RLE Deficits / Details: grossly 3-/5 LLE Deficits / Details: grossly 3-/5    Cervical / Trunk Assessment Cervical / Trunk Assessment: Kyphotic  Communication   Communication: Expressive difficulties  Cognition Arousal/Alertness: Awake/alert Behavior During Therapy: Impulsive Overall Cognitive Status: No family/caregiver present to determine baseline cognitive functioning                                        General Comments      Exercises     Assessment/Plan    PT Assessment Patient needs continued PT services  PT Problem List Decreased strength;Decreased activity tolerance;Decreased balance;Decreased mobility;Decreased knowledge of use of DME;Decreased safety awareness;Decreased knowledge of precautions;Cardiopulmonary status limiting activity;Decreased coordination       PT Treatment Interventions DME instruction;Gait training;Functional mobility training;Therapeutic activities;Therapeutic exercise;Balance training;Patient/family education    PT Goals (Current goals can be found in the Care Plan section)  Acute Rehab PT Goals Patient Stated Goal: none stated PT Goal Formulation: Patient unable to participate in goal setting Time For Goal Achievement: 08/14/16 Potential to Achieve Goals: Good    Frequency Min 3X/week   Barriers to discharge        Co-evaluation               End of Session Equipment Utilized During Treatment: Gait belt Activity Tolerance: Patient limited by fatigue Patient left: in chair;with call bell/phone within reach;with chair alarm set Nurse Communication: Mobility status PT Visit  Diagnosis: Unsteadiness on feet (R26.81);Muscle weakness (generalized) (M62.81)    Time: 6812-7517 PT Time Calculation (min) (ACUTE ONLY): 18 min   Charges:   PT Evaluation $PT Eval Moderate Complexity: 1 Procedure     PT G Codes:   PT G-Codes **NOT FOR INPATIENT CLASS** Functional Assessment Tool Used: AM-PAC 6 Clicks Basic Mobility Functional Limitation: Mobility: Walking and moving around Mobility: Walking and Moving Around Current Status (G0174): At least 60 percent but less than 80 percent impaired, limited or restricted Mobility: Walking and Moving Around Goal Status [phone]): At least 1 percent but less than 20 percent impaired, limited or restricted    [place] [phone] (pager)   Denice Paradise 07/31/2016, 2:01 PM

## 2016-08-01 ENCOUNTER — Inpatient Hospital Stay (HOSPITAL_COMMUNITY): Payer: Medicare HMO

## 2016-08-01 LAB — CBC
HEMATOCRIT: 37.4 % — AB (ref 39.0–52.0)
HEMOGLOBIN: 12.1 g/dL — AB (ref 13.0–17.0)
MCH: 32.3 pg (ref 26.0–34.0)
MCHC: 32.4 g/dL (ref 30.0–36.0)
MCV: 99.7 fL (ref 78.0–100.0)
Platelets: 217 10*3/uL (ref 150–400)
RBC: 3.75 MIL/uL — AB (ref 4.22–5.81)
RDW: 13.8 % (ref 11.5–15.5)
WBC: 11.8 10*3/uL — AB (ref 4.0–10.5)

## 2016-08-01 NOTE — Progress Notes (Addendum)
   Subjective: Patient is doing well this morning. Reports he feels well, no complaints. Repeat CXR pending.   Objective:  Vital signs in last 24 hours: Vitals:   07/31/16 0502 07/31/16 1454 07/31/16 2147 08/01/16 0610  BP: 111/75 (!) 91/53 115/74 140/75  Pulse: 94 86 78 70  Resp: 18 20 18 19   Temp: 98.6 F (37 C) 97.3 F (36.3 C) 98.1 F (36.7 C) 99 F (37.2 C)  TempSrc:   Oral Oral  SpO2: 93% 97% 98% 91%  Weight:       Physical Exam Constitutional: NAD, appears comfortable HEENT: Left pupil round and reactive to light. Right pupil dilated and non reactive.   Neck: Supple, trachea midline.  Cardiovascular: RRR, no murmurs, rubs, or gallops.  Pulmonary/Chest: transmission of upper airway secretions throughout all lung fields, no wheezing or crackles. Breathing comfortably on supplemental oxygen.  Abdominal: Soft, non tender, non distended. +BS.  Extremities: Warm and well perfused. Distal pulses intact. No edema.  Neurological: A&Ox3, CN II - XII grossly intact. Bilateral lower extremity clonus with patellar reflex. Upper extremity dysmetria.    Assessment/Plan:  The patient is a 71 year old male with a history of severe traumatic brain injury in 1991, COPD and seizureswho presents to the emergency department via EMS after his wife states she thinks he had a seizure found to be tachycardic, febrile and tachypnea with chest radiograph demonstrating right lower lobe consolidation.  Sepsis 2/2 to Aspiration PNA: Patient with a history of dysphagia. Noted to have several episodes of aspiration 48 hours prior to admission. Increased cough in the interval. Patient was febrile with leukocytosis, tachycardia, and tachypnea on presentation. CXR showed possible right lower lobe infiltrate. Started on Unasyn. Leukocytosis improved today 15.5 -> 11.8. Symptoms improved, he remains afebrile. -- Repeat CXR this AM pending -- Likely transition abx today pending CXR;  IV Unasyn --> PO  Augmentin to complete course  -- Speech evaluation  -- PT/OT  Seizure: Patient has a known seizure disorder and is on lamotrigine outpatient. Patient presents this admission after a presumed seizure from home - likely due to aspiration pneumonia +/- sepsis lowering seizure threshold. Wife found patient sitting on the toilet slumped over with deviated gaze and drooling. He was pos-ictal for about 10 minutes following the episode before returning to baseline. No further evidence of seizure activity since admission.  -- Continue lamotrigine 50 mg BID for now  -- F/u neurology outpatient  -- F/u lamotrigine level   COPD -- Albuterol every 2 hours when necessary  CAD -- Aspirin 81 mg once daily -- Clopidogrel 75 mg once daily -- Isosorbide mononitrate 30 mg once daily  Rehabilitation Needs -- PT -- OT -- SLP  DVT/PE prophylaxis:Lovenox FEN/GI:Dysphagia 1 diet Code:Full code  Dispo: Anticipated discharge in approximately 1-2 day(s).   Velna Ochs, MD 08/01/2016, 9:02 AM Pager: [phone]  Medicine attending: I examined this patient today and I concur with the evaluation and management plan as recorded above by resident physician Dr. Candace Cruise.  To my exam, he appears to be back to his baseline.  No witnessed seizure activity since admission.  We covered him with antibiotics for possible aspiration pneumonia.  White blood count peaked at 15,000.  Today 12,000.  Highest temperature 100.6 rectal on admission.  Higher subsequent temperature 99.  We will transition to oral Augmentin.  Follow-up chest radiograph today. Anticipate discharge tomorrow.

## 2016-08-02 ENCOUNTER — Encounter: Payer: Self-pay | Admitting: Internal Medicine

## 2016-08-02 MED ORDER — AMOXICILLIN-POT CLAVULANATE 875-125 MG PO TABS: 1.0000 | ORAL_TABLET | Freq: Two times a day (BID) | ORAL | 0 refills | Status: AC

## 2016-08-02 NOTE — Progress Notes (Addendum)
Subjective: Patient doing well today. Breathing has improved. He is complaining of a mild headache.   Objective:  Vital signs in last 24 hours: Vitals:   07/31/16 2147 08/01/16 0610 08/01/16 2238 08/02/16 0615  BP: 115/74 140/75 115/77 133/75  Pulse: 78 70 73 70  Resp: 18 19 18 18   Temp: 98.1 F (36.7 C) 99 F (37.2 C) 98.4 F (36.9 C) 98.4 F (36.9 C)  TempSrc: Oral Oral    SpO2: 98% 91% 99% 99%  Weight:       Physical Exam Constitutional: NAD, appears comfortable HEENT: Left pupil round and reactive to light. Right pupil dilated and non reactive.  Neck: Supple, trachea midline.  Cardiovascular:RRR, no murmurs, rubs, or gallops.  Pulmonary/Chest:transmission of upper airway secretions throughout all lung fields, no wheezing or crackles.  Abdominal:Soft, non tender, non distended. +BS.  Extremities: Warm and well perfused. Distal pulses intact. No edema.  Neurological:A&Ox3, CN II - XII grossly intact. Bilateral lower extremity clonus with patellar reflex. Upper extremity dysmetria  Assessment/Plan:  The patient is a 71 year old male with a history of severe traumatic brain injury in 1991, COPD and seizureswho presents to the emergency department via EMS after his wife states she thinks he had a seizure found to be tachycardic, febrile and tachypnea with chest radiograph demonstrating right lower lobe consolidation.  Sepsis 2/2 to Aspiration PNA: Patient with a history of dysphagia. Noted to have several episodes of aspiration 48 hours prior to admission. Increased cough in the interval. Patient was febrile with leukocytosis, tachycardia,and tachypnea on presentation. CXR showed possible right lower lobe infiltrate. Started on Unasyn. Leukocytosis improved yesterday 15.5 -> 11.8. Symptoms improved, he remains afebrile. -- Day 4 of  IV Unasyn --> PO Augmentin to complete course on discharge  -- Speech evaluation  -- PT/OT  Seizure:Patient has a known seizure  disorder and is on lamotrigine outpatient. Patient presents this admission after a presumed seizure from home - likely due to aspiration pneumonia +/- sepsis lowering seizure threshold. Wife found patient sitting on the toilet slumped over with deviated gaze and drooling. He was pos-ictal for about 10 minutes following the episode before returning to baseline. No further evidence of seizure activity since admission.  -- Continue lamotrigine 50 mg BID for now  -- F/u neurology outpatient  -- F/u lamotrigine level   COPD -- Albuterol every 2 hours when necessary  CAD -- Aspirin 81 mg once daily -- Clopidogrel 75 mg once daily -- Isosorbide mononitrate 30 mg once daily  Rehabilitation Needs -- PT -- OT -- SLP  DVT/PE prophylaxis:Lovenox FEN/GI:Dysphagia 1 diet Code:Full code  Dispo: Anticipated discharge today.   Velna Ochs, MD 08/02/2016, 10:36 AM Pager: [phone]  Medicine attending discharge note: I personally examined this patient on the day of discharge and I attest to the accuracy of the discharge evaluation and plan as recorded above in the final progress note by resident physician Dr. Candace Cruise.  Pleasant 71 year old man with chronic neurologic deficits and a seizure disorder secondary to previous traumatic brain injury and subsequent stroke.  He presented on the day of the current admission when wife heard him groaning in the bathroom and found him slumped over the toilet with eyes deviated and unresponsive for a few minutes.  No obvious tonic-clonic activity noted at that time. He is on chronic Lamictal to control seizures.  He had not had a seizure in a number of months. White blood count modestly elevated and low-grade fever on presentation.  Chest x-ray  with a vague area of consolidation in the medial right lung base.  Unchanged on a study done 48 hours later.  He has had increasing problems with handling oral secretions.  We elected to cover him for  possible aspiration pneumonia although low-grade fever and leukocytosis could be seen as a manifestation of an acute seizure.  Fevers resolved promptly within 24 hours of admission.  Peak white count 15,000, 11,800 on day before discharge April 7. He is at his baseline mental status.  Jovial.  Follows all commands appropriately.  Lungs with coarse rales at the right base.  Disposition: Condition stable at time of discharge We will continue a course of antibiotics with oral Augmentin. Follow-up in our general medicine clinic There were no complications

## 2016-08-02 NOTE — Care Management Note (Signed)
Case Management Note  Patient Details  Name: Todd [name] MRN: [id number] Date of Birth: 1946/01/07  Subjective/Objective:  71 y.o. M admitted 07/30/2016 with syncope and Sz. Pt has Hx TBI since 1991 and attends [facility] where they have "damaged his TallRW" per wife at bedside. Made [facility] aware that he will need another prior to discharge. HHPT/OT/ST will be resumed with [facility] and [facility], the rep has been notified. (actually a home referral that had not started since discharge from [facility]) Action/Plan:CM will sign off for now but will be available should additional discharge needs arise or disposition change.    Expected Discharge Date:  08/02/16               Expected Discharge Plan:  [place]  In-House Referral:  NA  Discharge planning Services  CM Consult  Post Acute Care Choice:  Durable Medical Equipment, Resumption of Svcs/PTA Provider Choice offered to:  Spouse  DME Arranged:  [facility] D[facility] Agency:  [place] Arranged:  PT, OT, Speech Therapy [place] Agency:  [place]  Status of Service:  Completed, signed off  If discussed at [place] of Stay Meetings, dates discussed:    Additional Comments:  Delrae Sawyers, RN 08/02/2016, 11:52 AM

## 2016-08-02 NOTE — Progress Notes (Signed)
CSW has arranged transportation via [facility] for patient to return home on today. No other needs reported at this time. CSW to sign off.   Lucius Conn, [place] Worker [facility] Ph: [phone]

## 2016-08-02 NOTE — Progress Notes (Signed)
Todd [name] to be D/C'd to home with home health per MD order.  Discussed with the patient and all questions fully answered.  VSS, Skin clean, dry and intact without evidence of skin break down, no evidence of skin tears noted. IV catheter discontinued intact. Site without signs and symptoms of complications. Dressing and pressure applied.  An After Visit Summary was printed and given to the patient. [name] wife received prescription.  D/c education completed with patient/family including follow up instructions, medication list, d/c activities limitations if indicated, with other d/c instructions as indicated by MD - patient's wife able to verbalize understanding, all questions fully answered.   Patient instructed to return to ED, call 911, or call MD for any changes in condition.   Patient escorted via stretcher, and D/C home via [facility].  Todd [name] 08/02/2016 4:32 PM

## 2016-08-03 DIAGNOSIS — J449 Chronic obstructive pulmonary disease, unspecified: Secondary | ICD-10-CM | POA: Diagnosis not present

## 2016-08-03 DIAGNOSIS — R2689 Other abnormalities of gait and mobility: Secondary | ICD-10-CM | POA: Diagnosis not present

## 2016-08-03 DIAGNOSIS — K219 Gastro-esophageal reflux disease without esophagitis: Secondary | ICD-10-CM | POA: Diagnosis not present

## 2016-08-03 DIAGNOSIS — Z87891 Personal history of nicotine dependence: Secondary | ICD-10-CM | POA: Diagnosis not present

## 2016-08-03 DIAGNOSIS — Z8701 Personal history of pneumonia (recurrent): Secondary | ICD-10-CM | POA: Diagnosis not present

## 2016-08-03 DIAGNOSIS — S062X0S Diffuse traumatic brain injury without loss of consciousness, sequela: Secondary | ICD-10-CM | POA: Diagnosis not present

## 2016-08-03 DIAGNOSIS — G40909 Epilepsy, unspecified, not intractable, without status epilepticus: Secondary | ICD-10-CM | POA: Diagnosis not present

## 2016-08-03 DIAGNOSIS — H547 Unspecified visual loss: Secondary | ICD-10-CM | POA: Diagnosis not present

## 2016-08-03 DIAGNOSIS — K759 Inflammatory liver disease, unspecified: Secondary | ICD-10-CM | POA: Diagnosis not present

## 2016-08-04 ENCOUNTER — Encounter: Payer: Self-pay | Admitting: Internal Medicine

## 2016-08-04 DIAGNOSIS — K759 Inflammatory liver disease, unspecified: Secondary | ICD-10-CM | POA: Diagnosis not present

## 2016-08-04 DIAGNOSIS — K219 Gastro-esophageal reflux disease without esophagitis: Secondary | ICD-10-CM | POA: Diagnosis not present

## 2016-08-04 DIAGNOSIS — J449 Chronic obstructive pulmonary disease, unspecified: Secondary | ICD-10-CM | POA: Diagnosis not present

## 2016-08-04 DIAGNOSIS — H547 Unspecified visual loss: Secondary | ICD-10-CM | POA: Diagnosis not present

## 2016-08-04 DIAGNOSIS — R2689 Other abnormalities of gait and mobility: Secondary | ICD-10-CM | POA: Diagnosis not present

## 2016-08-04 DIAGNOSIS — Z87891 Personal history of nicotine dependence: Secondary | ICD-10-CM | POA: Diagnosis not present

## 2016-08-04 DIAGNOSIS — Z8701 Personal history of pneumonia (recurrent): Secondary | ICD-10-CM | POA: Diagnosis not present

## 2016-08-04 DIAGNOSIS — G40909 Epilepsy, unspecified, not intractable, without status epilepticus: Secondary | ICD-10-CM | POA: Diagnosis not present

## 2016-08-04 DIAGNOSIS — S062X0S Diffuse traumatic brain injury without loss of consciousness, sequela: Secondary | ICD-10-CM | POA: Diagnosis not present

## 2016-08-04 LAB — LAMOTRIGINE LEVEL: Lamotrigine Lvl: 2.7 ug/mL (ref 2.0–20.0)

## 2016-08-05 ENCOUNTER — Telehealth: Payer: Self-pay | Admitting: Neurology

## 2016-08-05 ENCOUNTER — Telehealth: Payer: Self-pay | Admitting: Internal Medicine

## 2016-08-05 ENCOUNTER — Encounter: Payer: Self-pay | Admitting: Neurology

## 2016-08-05 ENCOUNTER — Telehealth: Payer: Self-pay

## 2016-08-05 DIAGNOSIS — Z8701 Personal history of pneumonia (recurrent): Secondary | ICD-10-CM | POA: Diagnosis not present

## 2016-08-05 DIAGNOSIS — J449 Chronic obstructive pulmonary disease, unspecified: Secondary | ICD-10-CM | POA: Diagnosis not present

## 2016-08-05 DIAGNOSIS — K219 Gastro-esophageal reflux disease without esophagitis: Secondary | ICD-10-CM | POA: Diagnosis not present

## 2016-08-05 DIAGNOSIS — S062X0S Diffuse traumatic brain injury without loss of consciousness, sequela: Secondary | ICD-10-CM | POA: Diagnosis not present

## 2016-08-05 DIAGNOSIS — R2689 Other abnormalities of gait and mobility: Secondary | ICD-10-CM | POA: Diagnosis not present

## 2016-08-05 DIAGNOSIS — H547 Unspecified visual loss: Secondary | ICD-10-CM | POA: Diagnosis not present

## 2016-08-05 DIAGNOSIS — G40909 Epilepsy, unspecified, not intractable, without status epilepticus: Secondary | ICD-10-CM | POA: Diagnosis not present

## 2016-08-05 DIAGNOSIS — Z87891 Personal history of nicotine dependence: Secondary | ICD-10-CM | POA: Diagnosis not present

## 2016-08-05 DIAGNOSIS — K759 Inflammatory liver disease, unspecified: Secondary | ICD-10-CM | POA: Diagnosis not present

## 2016-08-05 MED ORDER — LAMOTRIGINE 100 MG PO TABS: ORAL_TABLET | ORAL | 6 refills | Status: DC

## 2016-08-05 NOTE — Telephone Encounter (Signed)
Speech pathology from [facility] calling please call back

## 2016-08-05 NOTE — Telephone Encounter (Signed)
VO for OT 1x week for 1 week, 2 week x 2 weeks, do you agree

## 2016-08-05 NOTE — Telephone Encounter (Signed)
Caller: PT's wife  Urgent? No  Reason for the call: PT had a seizure and was in the hospital, also had pneumonia and was wondering if meds should be adjusted

## 2016-08-05 NOTE — Telephone Encounter (Signed)
Letter and Rx printed for faxing, thanks

## 2016-08-05 NOTE — Telephone Encounter (Signed)
Left VO for speech pathology approval for eval and treat, do you agree?

## 2016-08-05 NOTE — Discharge Summary (Signed)
Name: Todd [name] MRN: [id number] DOB: 11/24/1945 71 y.o. PCP: Aldine Contes, MD  Date of Admission: 07/30/2016  7:57 PM Date of Discharge: 08/02/2016 Attending Physician: Dr. [name]   Discharge Diagnosis: 1. Aspiration Pneumonia 2. Seizure  Discharge Medications: Allergies as of 08/02/2016      Reactions   Acetaminophen Other (See Comments)   Pt is unable to take this medication due to liver disease.     Aleve [naproxen] Other (See Comments)   Pt is unable to take this medication due to liver disease.     Beta Adrenergic Blockers Other (See Comments)   Reaction:  Disorientation    Codeine Hives, Other (See Comments)   Reaction:  Inflammation   Metoprolol Other (See Comments)   Reaction:  Dizziness    Nsaids Other (See Comments)   Pt is unable to take this medication due to liver disease.        Medication List    TAKE these medications   albuterol 108 (90 Base) MCG/ACT inhaler Commonly known as:  PROAIR HFA inhale 2 puffs INTO THE LUNGS every 6 hours if needed for wheezing What changed:  how much to take  how to take this  when to take this  reasons to take this  additional instructions   alendronate 70 MG tablet Commonly known as:  FOSAMAX Take 1 tablet (70 mg total) by mouth once a week. Take with a full glass of water on an empty stomach. What changed:  when to take this  additional instructions   amoxicillin-clavulanate 875-125 MG tablet Commonly known as:  AUGMENTIN Take 1 tablet by mouth 2 (two) times daily.   aspirin 81 MG chewable tablet Chew 81 mg by mouth daily.   atorvastatin 80 MG tablet Commonly known as:  LIPITOR Take 1 tablet (80 mg total) by mouth daily at 6 PM.   b complex vitamins tablet Take 1 tablet by mouth daily.   Biotin 1000 MCG tablet Take 1,000 mcg by mouth daily.   clopidogrel 75 MG tablet Commonly known as:  PLAVIX take 1 tablet by mouth every morning   desonide 0.05 % cream Commonly known  as:  DESOWEN Apply 1 application topically 2 (two) times daily. Pt applies to hair line.   famotidine 20 MG tablet Commonly known as:  PEPCID Take 20 mg by mouth daily.   fluticasone 50 MCG/ACT nasal spray Commonly known as:  FLONASE instill 1 spray into each nostril once daily   guaifenesin 400 MG Tabs tablet Commonly known as:  HUMIBID E Take 400 mg by mouth 2 (two) times daily.   isosorbide mononitrate 30 MG 24 hr tablet Commonly known as:  IMDUR Take 1 tablet (30 mg total) by mouth daily.   ketotifen 0.025 % ophthalmic solution Commonly known as:  [employer] ANTIHISTAMINE EYE DROPS Place 1 drop into both eyes 2 (two) times daily.   lamoTRIgine 100 MG tablet Commonly known as:  LAMICTAL Take 0.5 tablets (50 mg total) by mouth 2 (two) times daily.   levocetirizine 5 MG tablet Commonly known as:  XYZAL Take 5 mg by mouth every evening.   lidocaine 5 % Commonly known as:  LIDODERM Place 1 patch onto the skin daily. Pt applies to back.  Remove & Discard patch within 12 hours or as directed by MD   multivitamin with minerals Tabs tablet Take 1 tablet by mouth daily.   oxyCODONE 5 MG immediate release tablet Commonly known as:  Oxy IR/ROXICODONE Take 2.5-5 mg  by mouth 2 (two) times daily as needed for severe pain.   TUSSIONEX PENNKINETIC ER 10-8 MG/5ML [place] Generic drug:  chlorpheniramine-HYDROcodone Take 5 mLs by mouth every 12 (twelve) hours as needed for cough.   vitamin C 1000 MG tablet Take 1,000 mg by mouth daily.   Vitamin D 2000 units tablet Take 2,000 Units by mouth daily.       Disposition and follow-up:   Todd [name] was discharged from [facility] in Stable condition.  At the hospital follow up visit please address:  1.  Seizure: Patient presented after presumed seizure from home, likely as a result of lowered seizure threshold due to his fever and aspiration pneumonia. Lamotrigine level was checked and resulted low normal at 2.7  (reference range 2.0 - 20 ug/mL). He is currently prescribed 50 mg BID. Please consider increasing dose for better therapeutic effect.   2. Aspiration PNA: The patient was treated with IV Unasyn 2 days with rapid clinical improvement. He was discharged on a 5 day course of Augmentin. Please follow up symptoms.  3.  Labs / imaging needed at time of follow-up: None   4.  Pending labs/ test needing follow-up: None   Follow-up Appointments: Follow-up Information    NARENDRA, NISCHAL, MD. Schedule an appointment as soon as possible for a visit in 2 week(s).   Specialty:  Internal Medicine Why:  Please schedule an appoitnment for hospital follow up in the next 1-2 weeks  Contact information: [address], [address] [place] [address] [place] Follow up.   Why:  PT/OT/Speech Therapy will be resumed at discharge. Arepresentative from the agency will be in touch to coordinate the initial visit.  Contact information: [place] [address] [place] Follow up.   Why:  Tall [facility]W Contact information: [address] [address] [place] Hospital Course by problem list:  1. Aspiration PNA: The patient is a 71 year old male history of severe traumatic brain injury in 1991 who presented from home after a presumed seizure (see #2). Per wife, patient had witnessed aspiration events and cough over 2-3 days prior to admission. On arrival, patient was febrile to 100.6, tachycardic 110-120, tachypneic with respiratory rate in the 20s and hypoxic requiring 2 L nasal cannula. CBC was significant for leukocytosis of 12.4. Chest x-ray revealed a right lower lobe consolidation concerning for aspiration pneumonia. He was treated with IV Unasyn for 2 days with rapid clinical improvement. He was transitioned to by mouth Augmentin on discharge x 5 days.  2. Seizure:  Patient has a known seizure disorder from his TBI in 17 and is on Lamictal outpatient. He presented this admission after presumed seizure from home. Per wife, she heard patient in the bathroom making "growling noises". She went to the bathroom and found the patient slumped over on the toilet with his eyes deviated to the side and drooling. He was unresponsive for about 2-3 minutes. He was confused, likely postictal for about 10 minutes following the episode before returning to baseline. It is likely that his fever and aspiration pneumonia lowered his seizure threshold. He had a similar episode 4 or 5 months ago that was reportedly not as severe. He was treated with antibiotics for his aspiration pneumonia and his home Lamictal was continued.  No further seizure activity was noted this admission. Lamotrigine level was checked but unfortunately did not result until after discharge. Level was low normal at 2.7 (reference range 2-20 ug/mL). Patient is currently prescribed 50 mg twice a day. Please consider increasing this dose for better therapeutic effect.   Discharge Vitals:   BP 133/75 (BP Location: Right Arm)   Pulse 70   Temp 98.4 F (36.9 C)   Resp 18   Wt 175 lb 7.8 oz (79.6 kg)   SpO2 99%   BMI 25.91 kg/m   Pertinent Labs, Studies, and Procedures:   07/30/2016 DG Chest 2 View: IMPRESSION: Right lower lobe consolidation, which may indicate pneumonia.  08/01/2016 DG Chest 2 View: IMPRESSION: Right lower lobe airspace opacity, not significantly changed compared to most recent chest x-ray of 07/30/2016 but new compared to earlier chest x-ray, compatible with aspiration and/or pneumonia.    Ref. Range 07/31/2016 05:25  Lamotrigine, Serum Latest Ref Range: 2.0 - 20.0 ug/mL 2.7    Discharge Instructions: Discharge Instructions    Call MD for:  difficulty breathing, headache or visual disturbances    Complete by:  As directed    Call MD for:  temperature >100.4    Complete by:  As directed     Diet - low sodium heart healthy    Complete by:  As directed    Discharge instructions    Complete by:  As directed    Todd [name],  It was a pleasure taking care of you. I am glad you are feeling better. I would like you to take Augmentin (an antibiotic) twice daily for the next 5 days to complete your treatment course for your pneumonia. I have sent a prescription to your pharmacy. Please continue taking all of your other medicines as previously prescribed. I would like you to follow up with your primary care doctor in the next 1-2 weeks. You will be contacted for an appointment. If you have any questions or concerns, call our clinic at [phone] or after hours call [phone] and ask for the internal medicine resident on call. Thank you!   - Dr. Philipp Ovens   Increase activity slowly    Complete by:  As directed       Signed: Velna Ochs, MD 08/05/2016, 7:24 AM   Pager: [phone]

## 2016-08-05 NOTE — Telephone Encounter (Signed)
Yes I agree. 

## 2016-08-05 NOTE — Telephone Encounter (Signed)
Todd [name] from [facility] requesting VO. Please call back.

## 2016-08-05 NOTE — Telephone Encounter (Signed)
Will forward message to provider. 

## 2016-08-05 NOTE — Telephone Encounter (Signed)
Yes I agree. Thank you 

## 2016-08-05 NOTE — Telephone Encounter (Signed)
Cld pt's wife - [name] - advsd of provider's notations. [name] is okay with increase.  She advsd that a Rx and ov notes will need to be faxed to: Dr. Herold Harms  Fax# [phone]  Advsd I will forward message to provider for Rx then faxed to [facility].

## 2016-08-05 NOTE — Telephone Encounter (Signed)
Pls let wife know I reviewed records, is she okay with increasing Lamotrigine 100mg : Take 1/2 tablet in AM, 1 tablet in PM (he is currently on 1/2 tab BID). If she is okay with this, pls ask where to send Rx (in the past it was with the [place]). THanks!

## 2016-08-06 NOTE — Telephone Encounter (Signed)
Faxed letter and Rx to Dr. Herold Harms.

## 2016-08-07 DIAGNOSIS — H547 Unspecified visual loss: Secondary | ICD-10-CM | POA: Diagnosis not present

## 2016-08-07 DIAGNOSIS — J449 Chronic obstructive pulmonary disease, unspecified: Secondary | ICD-10-CM | POA: Diagnosis not present

## 2016-08-07 DIAGNOSIS — G40909 Epilepsy, unspecified, not intractable, without status epilepticus: Secondary | ICD-10-CM | POA: Diagnosis not present

## 2016-08-07 DIAGNOSIS — R2689 Other abnormalities of gait and mobility: Secondary | ICD-10-CM | POA: Diagnosis not present

## 2016-08-07 DIAGNOSIS — Z87891 Personal history of nicotine dependence: Secondary | ICD-10-CM | POA: Diagnosis not present

## 2016-08-07 DIAGNOSIS — K759 Inflammatory liver disease, unspecified: Secondary | ICD-10-CM | POA: Diagnosis not present

## 2016-08-07 DIAGNOSIS — K219 Gastro-esophageal reflux disease without esophagitis: Secondary | ICD-10-CM | POA: Diagnosis not present

## 2016-08-07 DIAGNOSIS — Z8701 Personal history of pneumonia (recurrent): Secondary | ICD-10-CM | POA: Diagnosis not present

## 2016-08-07 DIAGNOSIS — S062X0S Diffuse traumatic brain injury without loss of consciousness, sequela: Secondary | ICD-10-CM | POA: Diagnosis not present

## 2016-08-10 DIAGNOSIS — Z87891 Personal history of nicotine dependence: Secondary | ICD-10-CM | POA: Diagnosis not present

## 2016-08-10 DIAGNOSIS — S062X0S Diffuse traumatic brain injury without loss of consciousness, sequela: Secondary | ICD-10-CM | POA: Diagnosis not present

## 2016-08-10 DIAGNOSIS — G40909 Epilepsy, unspecified, not intractable, without status epilepticus: Secondary | ICD-10-CM | POA: Diagnosis not present

## 2016-08-10 DIAGNOSIS — H547 Unspecified visual loss: Secondary | ICD-10-CM | POA: Diagnosis not present

## 2016-08-10 DIAGNOSIS — R2689 Other abnormalities of gait and mobility: Secondary | ICD-10-CM | POA: Diagnosis not present

## 2016-08-10 DIAGNOSIS — Z8701 Personal history of pneumonia (recurrent): Secondary | ICD-10-CM | POA: Diagnosis not present

## 2016-08-10 DIAGNOSIS — J449 Chronic obstructive pulmonary disease, unspecified: Secondary | ICD-10-CM | POA: Diagnosis not present

## 2016-08-10 DIAGNOSIS — K219 Gastro-esophageal reflux disease without esophagitis: Secondary | ICD-10-CM | POA: Diagnosis not present

## 2016-08-10 DIAGNOSIS — K759 Inflammatory liver disease, unspecified: Secondary | ICD-10-CM | POA: Diagnosis not present

## 2016-08-11 ENCOUNTER — Telehealth: Payer: Self-pay

## 2016-08-11 DIAGNOSIS — Z87891 Personal history of nicotine dependence: Secondary | ICD-10-CM | POA: Diagnosis not present

## 2016-08-11 DIAGNOSIS — Z8701 Personal history of pneumonia (recurrent): Secondary | ICD-10-CM | POA: Diagnosis not present

## 2016-08-11 DIAGNOSIS — K219 Gastro-esophageal reflux disease without esophagitis: Secondary | ICD-10-CM | POA: Diagnosis not present

## 2016-08-11 DIAGNOSIS — K759 Inflammatory liver disease, unspecified: Secondary | ICD-10-CM | POA: Diagnosis not present

## 2016-08-11 DIAGNOSIS — G40909 Epilepsy, unspecified, not intractable, without status epilepticus: Secondary | ICD-10-CM | POA: Diagnosis not present

## 2016-08-11 DIAGNOSIS — S062X0S Diffuse traumatic brain injury without loss of consciousness, sequela: Secondary | ICD-10-CM | POA: Diagnosis not present

## 2016-08-11 DIAGNOSIS — R2689 Other abnormalities of gait and mobility: Secondary | ICD-10-CM | POA: Diagnosis not present

## 2016-08-11 DIAGNOSIS — H547 Unspecified visual loss: Secondary | ICD-10-CM | POA: Diagnosis not present

## 2016-08-11 DIAGNOSIS — J449 Chronic obstructive pulmonary disease, unspecified: Secondary | ICD-10-CM | POA: Diagnosis not present

## 2016-08-11 NOTE — Telephone Encounter (Signed)
Pt's spouse calls and ask about pt getting new shingles vaccine, she states at the pharm it will be 160.00 plus for the 2 injections. She would like for the imc to be able to help them and get it and give it, she is informed that imc will not be able to supply the vaccine but imc will be able to give the injections, she ask if imc could do it for them and is informed that rn will find out what is available to them, will rtc to her Dr Maudie Mercury do you know where they could get the 2 part series at a reasonable cost?

## 2016-08-11 NOTE — Telephone Encounter (Signed)
Need to speak with a nurse. Please call back.  

## 2016-08-12 DIAGNOSIS — K759 Inflammatory liver disease, unspecified: Secondary | ICD-10-CM | POA: Diagnosis not present

## 2016-08-12 DIAGNOSIS — Z8701 Personal history of pneumonia (recurrent): Secondary | ICD-10-CM | POA: Diagnosis not present

## 2016-08-12 DIAGNOSIS — G40909 Epilepsy, unspecified, not intractable, without status epilepticus: Secondary | ICD-10-CM | POA: Diagnosis not present

## 2016-08-12 DIAGNOSIS — K219 Gastro-esophageal reflux disease without esophagitis: Secondary | ICD-10-CM | POA: Diagnosis not present

## 2016-08-12 DIAGNOSIS — J449 Chronic obstructive pulmonary disease, unspecified: Secondary | ICD-10-CM | POA: Diagnosis not present

## 2016-08-12 DIAGNOSIS — R2689 Other abnormalities of gait and mobility: Secondary | ICD-10-CM | POA: Diagnosis not present

## 2016-08-12 DIAGNOSIS — S062X0S Diffuse traumatic brain injury without loss of consciousness, sequela: Secondary | ICD-10-CM | POA: Diagnosis not present

## 2016-08-12 DIAGNOSIS — H547 Unspecified visual loss: Secondary | ICD-10-CM | POA: Diagnosis not present

## 2016-08-12 DIAGNOSIS — Z87891 Personal history of nicotine dependence: Secondary | ICD-10-CM | POA: Diagnosis not present

## 2016-08-12 NOTE — Telephone Encounter (Signed)
Pharmacist at [facility] states it is 160 per injection (320 total) and that this is due to a deductible (patient has around 342 remaining on his deductible).  We can either postpone Shingrix until he meets his deductible at which point pharmacist stated the copay would be 0, or look into obtaining it for in-clinic administration. I can also explain to patient to see if able/willing to pay the 320.  Please let me know any thoughts/preferences and I can coordinate. Thank you!

## 2016-08-12 NOTE — Telephone Encounter (Signed)
I don't think that they wanted to pay that much. I am in favor of waiting for them to meet their deductible or findnig a way to give it to them in clinic.

## 2016-08-12 NOTE — Telephone Encounter (Signed)
Returned call & spoke to patient's wife. She stated speech therapy had noticed that patient seem more drowsy today.  Wife had given patient pain med last night. She further said that patient now has "perked up" & feeling fine. Advised that if any problems develop or patient gets worse to call us back or go to the ED or urgent care.

## 2016-08-12 NOTE — Telephone Encounter (Signed)
Per Medicare website, Shingrix is not covered under Medicare B at this time, so not able to administer in clinic as of now. [name] wife notified.

## 2016-08-12 NOTE — Telephone Encounter (Signed)
I am not sure. Dr. Maudie Mercury any insight into this?

## 2016-08-14 DIAGNOSIS — K759 Inflammatory liver disease, unspecified: Secondary | ICD-10-CM | POA: Diagnosis not present

## 2016-08-14 DIAGNOSIS — K219 Gastro-esophageal reflux disease without esophagitis: Secondary | ICD-10-CM | POA: Diagnosis not present

## 2016-08-14 DIAGNOSIS — Z87891 Personal history of nicotine dependence: Secondary | ICD-10-CM | POA: Diagnosis not present

## 2016-08-14 DIAGNOSIS — H547 Unspecified visual loss: Secondary | ICD-10-CM | POA: Diagnosis not present

## 2016-08-14 DIAGNOSIS — S062X0S Diffuse traumatic brain injury without loss of consciousness, sequela: Secondary | ICD-10-CM | POA: Diagnosis not present

## 2016-08-14 DIAGNOSIS — R2689 Other abnormalities of gait and mobility: Secondary | ICD-10-CM | POA: Diagnosis not present

## 2016-08-14 DIAGNOSIS — Z8701 Personal history of pneumonia (recurrent): Secondary | ICD-10-CM | POA: Diagnosis not present

## 2016-08-14 DIAGNOSIS — G40909 Epilepsy, unspecified, not intractable, without status epilepticus: Secondary | ICD-10-CM | POA: Diagnosis not present

## 2016-08-14 DIAGNOSIS — J449 Chronic obstructive pulmonary disease, unspecified: Secondary | ICD-10-CM | POA: Diagnosis not present

## 2016-08-17 ENCOUNTER — Ambulatory Visit (INDEPENDENT_AMBULATORY_CARE_PROVIDER_SITE_OTHER): Payer: Medicare HMO | Admitting: Internal Medicine

## 2016-08-17 ENCOUNTER — Encounter: Payer: Self-pay | Admitting: Internal Medicine

## 2016-08-17 DIAGNOSIS — Z09 Encounter for follow-up examination after completed treatment for conditions other than malignant neoplasm: Secondary | ICD-10-CM | POA: Diagnosis not present

## 2016-08-17 DIAGNOSIS — Z8701 Personal history of pneumonia (recurrent): Secondary | ICD-10-CM | POA: Diagnosis not present

## 2016-08-17 DIAGNOSIS — K219 Gastro-esophageal reflux disease without esophagitis: Secondary | ICD-10-CM | POA: Diagnosis not present

## 2016-08-17 DIAGNOSIS — K759 Inflammatory liver disease, unspecified: Secondary | ICD-10-CM | POA: Diagnosis not present

## 2016-08-17 DIAGNOSIS — G40909 Epilepsy, unspecified, not intractable, without status epilepticus: Secondary | ICD-10-CM

## 2016-08-17 DIAGNOSIS — R569 Unspecified convulsions: Secondary | ICD-10-CM

## 2016-08-17 DIAGNOSIS — J449 Chronic obstructive pulmonary disease, unspecified: Secondary | ICD-10-CM | POA: Diagnosis not present

## 2016-08-17 DIAGNOSIS — R2689 Other abnormalities of gait and mobility: Secondary | ICD-10-CM | POA: Diagnosis not present

## 2016-08-17 DIAGNOSIS — Z87891 Personal history of nicotine dependence: Secondary | ICD-10-CM | POA: Diagnosis not present

## 2016-08-17 DIAGNOSIS — H547 Unspecified visual loss: Secondary | ICD-10-CM | POA: Diagnosis not present

## 2016-08-17 DIAGNOSIS — S062X0S Diffuse traumatic brain injury without loss of consciousness, sequela: Secondary | ICD-10-CM | POA: Diagnosis not present

## 2016-08-17 NOTE — Progress Notes (Signed)
Internal Medicine Clinic Attending  Case discussed with Dr. Rivet soon after the resident saw the patient.  We reviewed the resident's history and exam and pertinent patient test results.  I agree with the assessment, diagnosis, and plan of care documented in the resident's note.  

## 2016-08-17 NOTE — Patient Instructions (Signed)
General Instructions: - You are doing great! Keep up the great work with therapy. - Will send message to Flossie Dibble (pharmacist) about Spiriva and see if we can set up a teaching appointment sometime this week  Please bring your medicines with you each time you come to clinic.  Medicines may include prescription medications, over-the-counter medications, herbal remedies, eye drops, vitamins, or other pills.   Progress Toward Treatment Goals:  No flowsheet data found.  Self Care Goals & Plans:  Self Care Goal 04/14/2016  Manage my medications take my medicines as prescribed; bring my medications to every visit; refill my medications on time  Eat healthy foods drink diet soda or water instead of juice or soda; eat more vegetables; eat foods that are low in salt; eat baked foods instead of fried foods; eat fruit for snacks and desserts  Be physically active find an activity I enjoy  Meeting treatment goals maintain the current self-care plan    No flowsheet data found.   Care Management & Community Referrals:  No flowsheet data found.

## 2016-08-17 NOTE — Addendum Note (Signed)
Addended by: Hulan Fray on: 08/17/2016 08:35 PM   Modules accepted: Orders
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
## 2016-08-17 NOTE — Assessment & Plan Note (Signed)
[name] aspiration pneumonia has resolved with Augmentin therapy. Patient is doing well with eating a soft diet and honey-thick liquids. Wife is very happy with PT/OT/SLP support. Reassured that his lungs were clear on exam today and to follow recommendations from SLP and OT in regards to diet/eating.

## 2016-08-17 NOTE — Assessment & Plan Note (Signed)
Patient hospitalized for a presumed seizure with aspiration pneumonia. His Lamictal dose was adjusted by his Neurologist, Dr. Charlotte Crumb, and patient has not had any recurrent seizures. Will have him continue Lamictal 50 mg in AM and 100 mg in PM. Patient to follow with outpatient Neuro as needed.

## 2016-08-18 ENCOUNTER — Telehealth: Payer: Self-pay | Admitting: Internal Medicine

## 2016-08-18 DIAGNOSIS — K759 Inflammatory liver disease, unspecified: Secondary | ICD-10-CM | POA: Diagnosis not present

## 2016-08-18 DIAGNOSIS — G40909 Epilepsy, unspecified, not intractable, without status epilepticus: Secondary | ICD-10-CM | POA: Diagnosis not present

## 2016-08-18 DIAGNOSIS — J449 Chronic obstructive pulmonary disease, unspecified: Secondary | ICD-10-CM | POA: Diagnosis not present

## 2016-08-18 DIAGNOSIS — Z8701 Personal history of pneumonia (recurrent): Secondary | ICD-10-CM | POA: Diagnosis not present

## 2016-08-18 DIAGNOSIS — S062X0S Diffuse traumatic brain injury without loss of consciousness, sequela: Secondary | ICD-10-CM | POA: Diagnosis not present

## 2016-08-18 DIAGNOSIS — K219 Gastro-esophageal reflux disease without esophagitis: Secondary | ICD-10-CM | POA: Diagnosis not present

## 2016-08-18 DIAGNOSIS — H547 Unspecified visual loss: Secondary | ICD-10-CM | POA: Diagnosis not present

## 2016-08-18 DIAGNOSIS — R2689 Other abnormalities of gait and mobility: Secondary | ICD-10-CM | POA: Diagnosis not present

## 2016-08-18 DIAGNOSIS — Z87891 Personal history of nicotine dependence: Secondary | ICD-10-CM | POA: Diagnosis not present

## 2016-08-18 NOTE — Telephone Encounter (Signed)
Ronalee Belts ( OT from [facility]) reports patient fell around 1 pm. Wife was beside patient & was able to assist during fall. EMS was called to assist with getting patient back to bed. No apparent injury, denies pain, patient with no complaints @ the moment.  Also requesting order for "platforms for walker". Patient having hard time using walker & Ronalee Belts stated platforms will help.

## 2016-08-18 NOTE — Telephone Encounter (Signed)
Speech Therapist from [facility] requesting VO and would like a call back as soon as possible [phone].

## 2016-08-18 NOTE — Telephone Encounter (Signed)
[facility] REP Calling to report a fall. Legrand Como is in the patient's home and needs a call back as soon as possible @ [phone]

## 2016-08-18 NOTE — Telephone Encounter (Signed)
Requesting recertification for OT 2x/wk x 4 wks,  ST 2x/wk for the next 3 wks PT will discharge end of this week.  Pls call Alonna Minium [phone]) for order updates/when approved.

## 2016-08-19 ENCOUNTER — Telehealth: Payer: Self-pay

## 2016-08-19 DIAGNOSIS — J449 Chronic obstructive pulmonary disease, unspecified: Secondary | ICD-10-CM | POA: Diagnosis not present

## 2016-08-19 DIAGNOSIS — R2689 Other abnormalities of gait and mobility: Secondary | ICD-10-CM | POA: Diagnosis not present

## 2016-08-19 DIAGNOSIS — H547 Unspecified visual loss: Secondary | ICD-10-CM | POA: Diagnosis not present

## 2016-08-19 DIAGNOSIS — K759 Inflammatory liver disease, unspecified: Secondary | ICD-10-CM | POA: Diagnosis not present

## 2016-08-19 DIAGNOSIS — K219 Gastro-esophageal reflux disease without esophagitis: Secondary | ICD-10-CM | POA: Diagnosis not present

## 2016-08-19 DIAGNOSIS — Z8701 Personal history of pneumonia (recurrent): Secondary | ICD-10-CM | POA: Diagnosis not present

## 2016-08-19 DIAGNOSIS — G40909 Epilepsy, unspecified, not intractable, without status epilepticus: Secondary | ICD-10-CM | POA: Diagnosis not present

## 2016-08-19 DIAGNOSIS — Z87891 Personal history of nicotine dependence: Secondary | ICD-10-CM | POA: Diagnosis not present

## 2016-08-19 DIAGNOSIS — S062X0S Diffuse traumatic brain injury without loss of consciousness, sequela: Secondary | ICD-10-CM | POA: Diagnosis not present

## 2016-08-19 NOTE — Telephone Encounter (Signed)
I agree with voice order for speech and OT. Do you need me to put another order in EPIC?

## 2016-08-19 NOTE — Telephone Encounter (Signed)
VO given for csw for community resources due to caregiver being overwhelmed, do you agree?

## 2016-08-19 NOTE — Telephone Encounter (Signed)
Yes I agree. Thank you 

## 2016-08-19 NOTE — Telephone Encounter (Signed)
Todd [name] from [facility] requesting VO. Please call back.

## 2016-08-19 NOTE — Telephone Encounter (Signed)
Called [facility] - no answer; left message for VO for "OT 2x/wk x 4 wks and ST 2x/wk for the next 3 weeks" per Dr Dareen Piano. And if not correct, to give a call back.

## 2016-08-20 ENCOUNTER — Ambulatory Visit: Payer: Medicare HMO | Admitting: Pharmacist

## 2016-08-20 ENCOUNTER — Encounter: Payer: Self-pay | Admitting: Pharmacist

## 2016-08-20 NOTE — Progress Notes (Signed)
Todd [name] is a 71 y.o. male who was contacted for inhaler education (spoke to patient's wife [name], who is unable to make it into clinic today so requested education via phone).   Baseline understanding includes ellipta and MDI HFA inhaler devices.  Educated patient on tiotropium (Spiriva) respimat inhaler including maintenance vs. rescue inhaler, dosing, preparation, and administration. An education handout was provided.   [name] did express understanding by repeating back information discussed and correctly demonstrating inhaler technique. Patient may benefit from further inhaler education in the future and was advised to follow up if concerns or questions arise.

## 2016-08-21 ENCOUNTER — Ambulatory Visit: Payer: Medicare HMO | Admitting: Pharmacist

## 2016-08-21 DIAGNOSIS — Z87891 Personal history of nicotine dependence: Secondary | ICD-10-CM | POA: Diagnosis not present

## 2016-08-21 DIAGNOSIS — R2689 Other abnormalities of gait and mobility: Secondary | ICD-10-CM | POA: Diagnosis not present

## 2016-08-21 DIAGNOSIS — K759 Inflammatory liver disease, unspecified: Secondary | ICD-10-CM | POA: Diagnosis not present

## 2016-08-21 DIAGNOSIS — G40909 Epilepsy, unspecified, not intractable, without status epilepticus: Secondary | ICD-10-CM | POA: Diagnosis not present

## 2016-08-21 DIAGNOSIS — H547 Unspecified visual loss: Secondary | ICD-10-CM | POA: Diagnosis not present

## 2016-08-21 DIAGNOSIS — S062X0S Diffuse traumatic brain injury without loss of consciousness, sequela: Secondary | ICD-10-CM | POA: Diagnosis not present

## 2016-08-21 DIAGNOSIS — Z8701 Personal history of pneumonia (recurrent): Secondary | ICD-10-CM | POA: Diagnosis not present

## 2016-08-21 DIAGNOSIS — J449 Chronic obstructive pulmonary disease, unspecified: Secondary | ICD-10-CM | POA: Diagnosis not present

## 2016-08-21 DIAGNOSIS — K219 Gastro-esophageal reflux disease without esophagitis: Secondary | ICD-10-CM | POA: Diagnosis not present

## 2016-08-24 DIAGNOSIS — R2689 Other abnormalities of gait and mobility: Secondary | ICD-10-CM | POA: Diagnosis not present

## 2016-08-24 DIAGNOSIS — K759 Inflammatory liver disease, unspecified: Secondary | ICD-10-CM | POA: Diagnosis not present

## 2016-08-24 DIAGNOSIS — S062X0S Diffuse traumatic brain injury without loss of consciousness, sequela: Secondary | ICD-10-CM | POA: Diagnosis not present

## 2016-08-24 DIAGNOSIS — K219 Gastro-esophageal reflux disease without esophagitis: Secondary | ICD-10-CM | POA: Diagnosis not present

## 2016-08-24 DIAGNOSIS — H547 Unspecified visual loss: Secondary | ICD-10-CM | POA: Diagnosis not present

## 2016-08-24 DIAGNOSIS — G40909 Epilepsy, unspecified, not intractable, without status epilepticus: Secondary | ICD-10-CM | POA: Diagnosis not present

## 2016-08-24 DIAGNOSIS — Z8701 Personal history of pneumonia (recurrent): Secondary | ICD-10-CM | POA: Diagnosis not present

## 2016-08-24 DIAGNOSIS — Z87891 Personal history of nicotine dependence: Secondary | ICD-10-CM | POA: Diagnosis not present

## 2016-08-24 DIAGNOSIS — J449 Chronic obstructive pulmonary disease, unspecified: Secondary | ICD-10-CM | POA: Diagnosis not present

## 2016-08-25 ENCOUNTER — Ambulatory Visit: Payer: Medicare HMO | Admitting: Pharmacist

## 2016-08-25 DIAGNOSIS — J449 Chronic obstructive pulmonary disease, unspecified: Secondary | ICD-10-CM | POA: Diagnosis not present

## 2016-08-25 DIAGNOSIS — S062X0S Diffuse traumatic brain injury without loss of consciousness, sequela: Secondary | ICD-10-CM | POA: Diagnosis not present

## 2016-08-25 DIAGNOSIS — R2689 Other abnormalities of gait and mobility: Secondary | ICD-10-CM | POA: Diagnosis not present

## 2016-08-25 DIAGNOSIS — Z8701 Personal history of pneumonia (recurrent): Secondary | ICD-10-CM | POA: Diagnosis not present

## 2016-08-25 DIAGNOSIS — H547 Unspecified visual loss: Secondary | ICD-10-CM | POA: Diagnosis not present

## 2016-08-25 DIAGNOSIS — K759 Inflammatory liver disease, unspecified: Secondary | ICD-10-CM | POA: Diagnosis not present

## 2016-08-25 DIAGNOSIS — Z87891 Personal history of nicotine dependence: Secondary | ICD-10-CM | POA: Diagnosis not present

## 2016-08-25 DIAGNOSIS — G40909 Epilepsy, unspecified, not intractable, without status epilepticus: Secondary | ICD-10-CM | POA: Diagnosis not present

## 2016-08-25 DIAGNOSIS — K219 Gastro-esophageal reflux disease without esophagitis: Secondary | ICD-10-CM | POA: Diagnosis not present

## 2016-08-26 ENCOUNTER — Telehealth: Payer: Self-pay

## 2016-08-26 DIAGNOSIS — K219 Gastro-esophageal reflux disease without esophagitis: Secondary | ICD-10-CM | POA: Diagnosis not present

## 2016-08-26 DIAGNOSIS — G40909 Epilepsy, unspecified, not intractable, without status epilepticus: Secondary | ICD-10-CM | POA: Diagnosis not present

## 2016-08-26 DIAGNOSIS — S062X0S Diffuse traumatic brain injury without loss of consciousness, sequela: Secondary | ICD-10-CM | POA: Diagnosis not present

## 2016-08-26 DIAGNOSIS — H547 Unspecified visual loss: Secondary | ICD-10-CM | POA: Diagnosis not present

## 2016-08-26 DIAGNOSIS — R2689 Other abnormalities of gait and mobility: Secondary | ICD-10-CM | POA: Diagnosis not present

## 2016-08-26 DIAGNOSIS — Z87891 Personal history of nicotine dependence: Secondary | ICD-10-CM | POA: Diagnosis not present

## 2016-08-26 DIAGNOSIS — K759 Inflammatory liver disease, unspecified: Secondary | ICD-10-CM | POA: Diagnosis not present

## 2016-08-26 DIAGNOSIS — Z8701 Personal history of pneumonia (recurrent): Secondary | ICD-10-CM | POA: Diagnosis not present

## 2016-08-26 DIAGNOSIS — J449 Chronic obstructive pulmonary disease, unspecified: Secondary | ICD-10-CM | POA: Diagnosis not present

## 2016-08-26 NOTE — Telephone Encounter (Addendum)
Todd [name], [facility] reports at todays visit, spouse states that dinner last evening was leftovers from the night before and he had done well with swallowing the first night but last night he became choked multiple times, she also gave him pudding to see if that would help and it didn't, he usually leans to the right but last pm he leaned to the left, also he stated to nurse today that he had an "episode" which usually means he has a seizure, he stated he was dizzy yesterday also it was hard for him to ambulate from bathroom this am- harder than usual. Today VS are bp 139/102 HR 87 t 97.3 02 sat 95%, bp was not checked again during visit. Otherwise at visit he seemed his normal self, she observed him having lunch and he did fine. Please advise

## 2016-08-26 NOTE — Telephone Encounter (Signed)
Please have him come in to be evaluated. Thank you

## 2016-08-26 NOTE — Telephone Encounter (Signed)
Have called several times, 1 line rings busy, the other I left a detailed message and stated [facility] has appt open at 1545 tomorrow if they could come in

## 2016-08-26 NOTE — Telephone Encounter (Signed)
Todd [name] from [facility] needs to speak with a nurse about pt.

## 2016-08-26 NOTE — Telephone Encounter (Signed)
I have called both ph#'s, no answer at one, busy at other, will continue to call

## 2016-08-27 ENCOUNTER — Encounter: Payer: Self-pay | Admitting: Internal Medicine

## 2016-08-27 NOTE — Telephone Encounter (Signed)
I called patient and discussed this with her and I responded to her email as well

## 2016-08-27 NOTE — Telephone Encounter (Signed)
Spoke with the patient's wife this morning and she does not want to bring patient in today.  She has sent a "Patient E-mail" .  Patient would like for you to give her a call back if possible to day to address her concerns rather than sch an appointment.  Please advise.

## 2016-08-31 DIAGNOSIS — R2689 Other abnormalities of gait and mobility: Secondary | ICD-10-CM | POA: Diagnosis not present

## 2016-08-31 DIAGNOSIS — J449 Chronic obstructive pulmonary disease, unspecified: Secondary | ICD-10-CM | POA: Diagnosis not present

## 2016-08-31 DIAGNOSIS — K759 Inflammatory liver disease, unspecified: Secondary | ICD-10-CM | POA: Diagnosis not present

## 2016-08-31 DIAGNOSIS — H547 Unspecified visual loss: Secondary | ICD-10-CM | POA: Diagnosis not present

## 2016-08-31 DIAGNOSIS — G40909 Epilepsy, unspecified, not intractable, without status epilepticus: Secondary | ICD-10-CM | POA: Diagnosis not present

## 2016-08-31 DIAGNOSIS — S062X0S Diffuse traumatic brain injury without loss of consciousness, sequela: Secondary | ICD-10-CM | POA: Diagnosis not present

## 2016-08-31 DIAGNOSIS — Z87891 Personal history of nicotine dependence: Secondary | ICD-10-CM | POA: Diagnosis not present

## 2016-08-31 DIAGNOSIS — Z8701 Personal history of pneumonia (recurrent): Secondary | ICD-10-CM | POA: Diagnosis not present

## 2016-08-31 DIAGNOSIS — K219 Gastro-esophageal reflux disease without esophagitis: Secondary | ICD-10-CM | POA: Diagnosis not present

## 2016-09-01 DIAGNOSIS — J449 Chronic obstructive pulmonary disease, unspecified: Secondary | ICD-10-CM | POA: Diagnosis not present

## 2016-09-01 DIAGNOSIS — G40909 Epilepsy, unspecified, not intractable, without status epilepticus: Secondary | ICD-10-CM | POA: Diagnosis not present

## 2016-09-01 DIAGNOSIS — S062X0S Diffuse traumatic brain injury without loss of consciousness, sequela: Secondary | ICD-10-CM | POA: Diagnosis not present

## 2016-09-01 DIAGNOSIS — K759 Inflammatory liver disease, unspecified: Secondary | ICD-10-CM | POA: Diagnosis not present

## 2016-09-01 DIAGNOSIS — Z8701 Personal history of pneumonia (recurrent): Secondary | ICD-10-CM | POA: Diagnosis not present

## 2016-09-01 DIAGNOSIS — K219 Gastro-esophageal reflux disease without esophagitis: Secondary | ICD-10-CM | POA: Diagnosis not present

## 2016-09-01 DIAGNOSIS — H547 Unspecified visual loss: Secondary | ICD-10-CM | POA: Diagnosis not present

## 2016-09-01 DIAGNOSIS — Z87891 Personal history of nicotine dependence: Secondary | ICD-10-CM | POA: Diagnosis not present

## 2016-09-01 DIAGNOSIS — R2689 Other abnormalities of gait and mobility: Secondary | ICD-10-CM | POA: Diagnosis not present

## 2016-09-02 ENCOUNTER — Telehealth: Payer: Self-pay

## 2016-09-02 DIAGNOSIS — K219 Gastro-esophageal reflux disease without esophagitis: Secondary | ICD-10-CM | POA: Diagnosis not present

## 2016-09-02 DIAGNOSIS — S062X0S Diffuse traumatic brain injury without loss of consciousness, sequela: Secondary | ICD-10-CM | POA: Diagnosis not present

## 2016-09-02 DIAGNOSIS — Z8701 Personal history of pneumonia (recurrent): Secondary | ICD-10-CM | POA: Diagnosis not present

## 2016-09-02 DIAGNOSIS — R2689 Other abnormalities of gait and mobility: Secondary | ICD-10-CM | POA: Diagnosis not present

## 2016-09-02 DIAGNOSIS — G40909 Epilepsy, unspecified, not intractable, without status epilepticus: Secondary | ICD-10-CM | POA: Diagnosis not present

## 2016-09-02 DIAGNOSIS — K759 Inflammatory liver disease, unspecified: Secondary | ICD-10-CM | POA: Diagnosis not present

## 2016-09-02 DIAGNOSIS — H547 Unspecified visual loss: Secondary | ICD-10-CM | POA: Diagnosis not present

## 2016-09-02 DIAGNOSIS — Z87891 Personal history of nicotine dependence: Secondary | ICD-10-CM | POA: Diagnosis not present

## 2016-09-02 DIAGNOSIS — J449 Chronic obstructive pulmonary disease, unspecified: Secondary | ICD-10-CM | POA: Diagnosis not present

## 2016-09-02 NOTE — Telephone Encounter (Signed)
Todd [name] from [facility] requesting to speak with a nurse about pt.

## 2016-09-03 DIAGNOSIS — J449 Chronic obstructive pulmonary disease, unspecified: Secondary | ICD-10-CM | POA: Diagnosis not present

## 2016-09-03 DIAGNOSIS — Z8701 Personal history of pneumonia (recurrent): Secondary | ICD-10-CM | POA: Diagnosis not present

## 2016-09-03 DIAGNOSIS — K219 Gastro-esophageal reflux disease without esophagitis: Secondary | ICD-10-CM | POA: Diagnosis not present

## 2016-09-03 DIAGNOSIS — H547 Unspecified visual loss: Secondary | ICD-10-CM | POA: Diagnosis not present

## 2016-09-03 DIAGNOSIS — G40909 Epilepsy, unspecified, not intractable, without status epilepticus: Secondary | ICD-10-CM | POA: Diagnosis not present

## 2016-09-03 DIAGNOSIS — Z87891 Personal history of nicotine dependence: Secondary | ICD-10-CM | POA: Diagnosis not present

## 2016-09-03 DIAGNOSIS — K759 Inflammatory liver disease, unspecified: Secondary | ICD-10-CM | POA: Diagnosis not present

## 2016-09-03 DIAGNOSIS — S062X0S Diffuse traumatic brain injury without loss of consciousness, sequela: Secondary | ICD-10-CM | POA: Diagnosis not present

## 2016-09-03 DIAGNOSIS — R2689 Other abnormalities of gait and mobility: Secondary | ICD-10-CM | POA: Diagnosis not present

## 2016-09-04 NOTE — Telephone Encounter (Signed)
Todd [name] from [facility] is waiting for a nurse to call back for VO. Please call back.

## 2016-09-04 NOTE — Telephone Encounter (Signed)
Gave VO for PT to eval and treat, do you approve?

## 2016-09-07 DIAGNOSIS — R2689 Other abnormalities of gait and mobility: Secondary | ICD-10-CM | POA: Diagnosis not present

## 2016-09-07 DIAGNOSIS — Z8701 Personal history of pneumonia (recurrent): Secondary | ICD-10-CM | POA: Diagnosis not present

## 2016-09-07 DIAGNOSIS — H547 Unspecified visual loss: Secondary | ICD-10-CM | POA: Diagnosis not present

## 2016-09-07 DIAGNOSIS — K759 Inflammatory liver disease, unspecified: Secondary | ICD-10-CM | POA: Diagnosis not present

## 2016-09-07 DIAGNOSIS — S062X0S Diffuse traumatic brain injury without loss of consciousness, sequela: Secondary | ICD-10-CM | POA: Diagnosis not present

## 2016-09-07 DIAGNOSIS — G40909 Epilepsy, unspecified, not intractable, without status epilepticus: Secondary | ICD-10-CM | POA: Diagnosis not present

## 2016-09-07 DIAGNOSIS — J449 Chronic obstructive pulmonary disease, unspecified: Secondary | ICD-10-CM | POA: Diagnosis not present

## 2016-09-07 DIAGNOSIS — Z87891 Personal history of nicotine dependence: Secondary | ICD-10-CM | POA: Diagnosis not present

## 2016-09-07 DIAGNOSIS — K219 Gastro-esophageal reflux disease without esophagitis: Secondary | ICD-10-CM | POA: Diagnosis not present

## 2016-09-07 NOTE — Telephone Encounter (Signed)
I agree. Thank you.

## 2016-09-08 DIAGNOSIS — K759 Inflammatory liver disease, unspecified: Secondary | ICD-10-CM | POA: Diagnosis not present

## 2016-09-08 DIAGNOSIS — R2689 Other abnormalities of gait and mobility: Secondary | ICD-10-CM | POA: Diagnosis not present

## 2016-09-08 DIAGNOSIS — S062X0S Diffuse traumatic brain injury without loss of consciousness, sequela: Secondary | ICD-10-CM | POA: Diagnosis not present

## 2016-09-08 DIAGNOSIS — Z87891 Personal history of nicotine dependence: Secondary | ICD-10-CM | POA: Diagnosis not present

## 2016-09-08 DIAGNOSIS — H547 Unspecified visual loss: Secondary | ICD-10-CM | POA: Diagnosis not present

## 2016-09-08 DIAGNOSIS — K219 Gastro-esophageal reflux disease without esophagitis: Secondary | ICD-10-CM | POA: Diagnosis not present

## 2016-09-08 DIAGNOSIS — J449 Chronic obstructive pulmonary disease, unspecified: Secondary | ICD-10-CM | POA: Diagnosis not present

## 2016-09-08 DIAGNOSIS — G40909 Epilepsy, unspecified, not intractable, without status epilepticus: Secondary | ICD-10-CM | POA: Diagnosis not present

## 2016-09-08 DIAGNOSIS — Z8701 Personal history of pneumonia (recurrent): Secondary | ICD-10-CM | POA: Diagnosis not present

## 2016-09-09 DIAGNOSIS — K219 Gastro-esophageal reflux disease without esophagitis: Secondary | ICD-10-CM | POA: Diagnosis not present

## 2016-09-09 DIAGNOSIS — Z8701 Personal history of pneumonia (recurrent): Secondary | ICD-10-CM | POA: Diagnosis not present

## 2016-09-09 DIAGNOSIS — R2689 Other abnormalities of gait and mobility: Secondary | ICD-10-CM | POA: Diagnosis not present

## 2016-09-09 DIAGNOSIS — G40909 Epilepsy, unspecified, not intractable, without status epilepticus: Secondary | ICD-10-CM | POA: Diagnosis not present

## 2016-09-09 DIAGNOSIS — Z87891 Personal history of nicotine dependence: Secondary | ICD-10-CM | POA: Diagnosis not present

## 2016-09-09 DIAGNOSIS — K759 Inflammatory liver disease, unspecified: Secondary | ICD-10-CM | POA: Diagnosis not present

## 2016-09-09 DIAGNOSIS — H547 Unspecified visual loss: Secondary | ICD-10-CM | POA: Diagnosis not present

## 2016-09-09 DIAGNOSIS — S062X0S Diffuse traumatic brain injury without loss of consciousness, sequela: Secondary | ICD-10-CM | POA: Diagnosis not present

## 2016-09-09 DIAGNOSIS — J449 Chronic obstructive pulmonary disease, unspecified: Secondary | ICD-10-CM | POA: Diagnosis not present

## 2016-09-10 DIAGNOSIS — R2689 Other abnormalities of gait and mobility: Secondary | ICD-10-CM | POA: Diagnosis not present

## 2016-09-10 DIAGNOSIS — S062X0S Diffuse traumatic brain injury without loss of consciousness, sequela: Secondary | ICD-10-CM | POA: Diagnosis not present

## 2016-09-10 DIAGNOSIS — J449 Chronic obstructive pulmonary disease, unspecified: Secondary | ICD-10-CM | POA: Diagnosis not present

## 2016-09-10 DIAGNOSIS — Z8701 Personal history of pneumonia (recurrent): Secondary | ICD-10-CM | POA: Diagnosis not present

## 2016-09-10 DIAGNOSIS — K219 Gastro-esophageal reflux disease without esophagitis: Secondary | ICD-10-CM | POA: Diagnosis not present

## 2016-09-10 DIAGNOSIS — Z87891 Personal history of nicotine dependence: Secondary | ICD-10-CM | POA: Diagnosis not present

## 2016-09-10 DIAGNOSIS — H547 Unspecified visual loss: Secondary | ICD-10-CM | POA: Diagnosis not present

## 2016-09-10 DIAGNOSIS — G40909 Epilepsy, unspecified, not intractable, without status epilepticus: Secondary | ICD-10-CM | POA: Diagnosis not present

## 2016-09-10 DIAGNOSIS — K759 Inflammatory liver disease, unspecified: Secondary | ICD-10-CM | POA: Diagnosis not present

## 2016-09-14 ENCOUNTER — Telehealth: Payer: Self-pay

## 2016-09-14 DIAGNOSIS — K759 Inflammatory liver disease, unspecified: Secondary | ICD-10-CM | POA: Diagnosis not present

## 2016-09-14 DIAGNOSIS — S062X0S Diffuse traumatic brain injury without loss of consciousness, sequela: Secondary | ICD-10-CM | POA: Diagnosis not present

## 2016-09-14 DIAGNOSIS — R2689 Other abnormalities of gait and mobility: Secondary | ICD-10-CM | POA: Diagnosis not present

## 2016-09-14 DIAGNOSIS — G40909 Epilepsy, unspecified, not intractable, without status epilepticus: Secondary | ICD-10-CM | POA: Diagnosis not present

## 2016-09-14 DIAGNOSIS — Z8701 Personal history of pneumonia (recurrent): Secondary | ICD-10-CM | POA: Diagnosis not present

## 2016-09-14 DIAGNOSIS — K219 Gastro-esophageal reflux disease without esophagitis: Secondary | ICD-10-CM | POA: Diagnosis not present

## 2016-09-14 DIAGNOSIS — J449 Chronic obstructive pulmonary disease, unspecified: Secondary | ICD-10-CM | POA: Diagnosis not present

## 2016-09-14 DIAGNOSIS — Z87891 Personal history of nicotine dependence: Secondary | ICD-10-CM | POA: Diagnosis not present

## 2016-09-14 DIAGNOSIS — H547 Unspecified visual loss: Secondary | ICD-10-CM | POA: Diagnosis not present

## 2016-09-14 NOTE — Telephone Encounter (Signed)
[name] from [facility] requesting VO. Please call back.

## 2016-09-15 ENCOUNTER — Telehealth: Payer: Self-pay | Admitting: Internal Medicine

## 2016-09-15 DIAGNOSIS — R2689 Other abnormalities of gait and mobility: Secondary | ICD-10-CM | POA: Diagnosis not present

## 2016-09-15 DIAGNOSIS — K219 Gastro-esophageal reflux disease without esophagitis: Secondary | ICD-10-CM | POA: Diagnosis not present

## 2016-09-15 DIAGNOSIS — Z87891 Personal history of nicotine dependence: Secondary | ICD-10-CM | POA: Diagnosis not present

## 2016-09-15 DIAGNOSIS — K759 Inflammatory liver disease, unspecified: Secondary | ICD-10-CM | POA: Diagnosis not present

## 2016-09-15 DIAGNOSIS — S062X0S Diffuse traumatic brain injury without loss of consciousness, sequela: Secondary | ICD-10-CM | POA: Diagnosis not present

## 2016-09-15 DIAGNOSIS — Z8701 Personal history of pneumonia (recurrent): Secondary | ICD-10-CM | POA: Diagnosis not present

## 2016-09-15 DIAGNOSIS — J449 Chronic obstructive pulmonary disease, unspecified: Secondary | ICD-10-CM | POA: Diagnosis not present

## 2016-09-15 DIAGNOSIS — H547 Unspecified visual loss: Secondary | ICD-10-CM | POA: Diagnosis not present

## 2016-09-15 DIAGNOSIS — G40909 Epilepsy, unspecified, not intractable, without status epilepticus: Secondary | ICD-10-CM | POA: Diagnosis not present

## 2016-09-15 NOTE — Telephone Encounter (Signed)
JIM HOFFMAN FROM [facility] NEEDS ORDERS FOR THIS PATIENT, IF YOU NEED TO REACH [name] [phone]

## 2016-09-15 NOTE — Telephone Encounter (Signed)
Todd [name], [facility] - no answer; left message to call us back.

## 2016-09-16 ENCOUNTER — Telehealth: Payer: Self-pay

## 2016-09-16 DIAGNOSIS — G40909 Epilepsy, unspecified, not intractable, without status epilepticus: Secondary | ICD-10-CM | POA: Diagnosis not present

## 2016-09-16 DIAGNOSIS — R2689 Other abnormalities of gait and mobility: Secondary | ICD-10-CM | POA: Diagnosis not present

## 2016-09-16 DIAGNOSIS — K759 Inflammatory liver disease, unspecified: Secondary | ICD-10-CM | POA: Diagnosis not present

## 2016-09-16 DIAGNOSIS — Z87891 Personal history of nicotine dependence: Secondary | ICD-10-CM | POA: Diagnosis not present

## 2016-09-16 DIAGNOSIS — H547 Unspecified visual loss: Secondary | ICD-10-CM | POA: Diagnosis not present

## 2016-09-16 DIAGNOSIS — S062X0S Diffuse traumatic brain injury without loss of consciousness, sequela: Secondary | ICD-10-CM | POA: Diagnosis not present

## 2016-09-16 DIAGNOSIS — K219 Gastro-esophageal reflux disease without esophagitis: Secondary | ICD-10-CM | POA: Diagnosis not present

## 2016-09-16 DIAGNOSIS — Z8701 Personal history of pneumonia (recurrent): Secondary | ICD-10-CM | POA: Diagnosis not present

## 2016-09-16 DIAGNOSIS — J449 Chronic obstructive pulmonary disease, unspecified: Secondary | ICD-10-CM | POA: Diagnosis not present

## 2016-09-16 NOTE — Telephone Encounter (Signed)
Jim from [facility] needs to speak with a nurse about pt. Please call pt back.

## 2016-09-16 NOTE — Telephone Encounter (Signed)
I agree. Thank you.

## 2016-09-16 NOTE — Telephone Encounter (Signed)
done

## 2016-09-16 NOTE — Telephone Encounter (Signed)
I spoke w/ jim, dr Dareen Piano agreed

## 2016-09-16 NOTE — Telephone Encounter (Signed)
[facility] PT jim hoffman calls and ask for VO for: 100/70 90/60 No dizziness Lung fields clear at rest, with exertion some gurgling VO 1x week this week, 2x week for 2 weeks Do you agree?

## 2016-09-17 DIAGNOSIS — H547 Unspecified visual loss: Secondary | ICD-10-CM | POA: Diagnosis not present

## 2016-09-17 DIAGNOSIS — Z87891 Personal history of nicotine dependence: Secondary | ICD-10-CM | POA: Diagnosis not present

## 2016-09-17 DIAGNOSIS — K219 Gastro-esophageal reflux disease without esophagitis: Secondary | ICD-10-CM | POA: Diagnosis not present

## 2016-09-17 DIAGNOSIS — S062X0S Diffuse traumatic brain injury without loss of consciousness, sequela: Secondary | ICD-10-CM | POA: Diagnosis not present

## 2016-09-17 DIAGNOSIS — Z8701 Personal history of pneumonia (recurrent): Secondary | ICD-10-CM | POA: Diagnosis not present

## 2016-09-17 DIAGNOSIS — G40909 Epilepsy, unspecified, not intractable, without status epilepticus: Secondary | ICD-10-CM | POA: Diagnosis not present

## 2016-09-17 DIAGNOSIS — J449 Chronic obstructive pulmonary disease, unspecified: Secondary | ICD-10-CM | POA: Diagnosis not present

## 2016-09-17 DIAGNOSIS — K759 Inflammatory liver disease, unspecified: Secondary | ICD-10-CM | POA: Diagnosis not present

## 2016-09-17 DIAGNOSIS — R2689 Other abnormalities of gait and mobility: Secondary | ICD-10-CM | POA: Diagnosis not present

## 2016-09-21 DIAGNOSIS — S062X0S Diffuse traumatic brain injury without loss of consciousness, sequela: Secondary | ICD-10-CM | POA: Diagnosis not present

## 2016-09-21 DIAGNOSIS — K759 Inflammatory liver disease, unspecified: Secondary | ICD-10-CM | POA: Diagnosis not present

## 2016-09-21 DIAGNOSIS — Z8701 Personal history of pneumonia (recurrent): Secondary | ICD-10-CM | POA: Diagnosis not present

## 2016-09-21 DIAGNOSIS — H547 Unspecified visual loss: Secondary | ICD-10-CM | POA: Diagnosis not present

## 2016-09-21 DIAGNOSIS — J449 Chronic obstructive pulmonary disease, unspecified: Secondary | ICD-10-CM | POA: Diagnosis not present

## 2016-09-21 DIAGNOSIS — K219 Gastro-esophageal reflux disease without esophagitis: Secondary | ICD-10-CM | POA: Diagnosis not present

## 2016-09-21 DIAGNOSIS — Z87891 Personal history of nicotine dependence: Secondary | ICD-10-CM | POA: Diagnosis not present

## 2016-09-21 DIAGNOSIS — R2689 Other abnormalities of gait and mobility: Secondary | ICD-10-CM | POA: Diagnosis not present

## 2016-09-21 DIAGNOSIS — G40909 Epilepsy, unspecified, not intractable, without status epilepticus: Secondary | ICD-10-CM | POA: Diagnosis not present

## 2016-09-22 ENCOUNTER — Telehealth: Payer: Self-pay

## 2016-09-22 DIAGNOSIS — H547 Unspecified visual loss: Secondary | ICD-10-CM | POA: Diagnosis not present

## 2016-09-22 DIAGNOSIS — Z8701 Personal history of pneumonia (recurrent): Secondary | ICD-10-CM | POA: Diagnosis not present

## 2016-09-22 DIAGNOSIS — K759 Inflammatory liver disease, unspecified: Secondary | ICD-10-CM | POA: Diagnosis not present

## 2016-09-22 DIAGNOSIS — Z87891 Personal history of nicotine dependence: Secondary | ICD-10-CM | POA: Diagnosis not present

## 2016-09-22 DIAGNOSIS — S062X0S Diffuse traumatic brain injury without loss of consciousness, sequela: Secondary | ICD-10-CM | POA: Diagnosis not present

## 2016-09-22 DIAGNOSIS — K219 Gastro-esophageal reflux disease without esophagitis: Secondary | ICD-10-CM | POA: Diagnosis not present

## 2016-09-22 DIAGNOSIS — R2689 Other abnormalities of gait and mobility: Secondary | ICD-10-CM | POA: Diagnosis not present

## 2016-09-22 DIAGNOSIS — G40909 Epilepsy, unspecified, not intractable, without status epilepticus: Secondary | ICD-10-CM | POA: Diagnosis not present

## 2016-09-22 DIAGNOSIS — J449 Chronic obstructive pulmonary disease, unspecified: Secondary | ICD-10-CM | POA: Diagnosis not present

## 2016-09-22 NOTE — Telephone Encounter (Signed)
VO for 2x week for 2 weeks and 1x week for 3 weeks, new cert, do you agree?

## 2016-09-22 NOTE — Telephone Encounter (Signed)
I agree. Thank you.

## 2016-09-22 NOTE — Telephone Encounter (Signed)
[name] from [facility] requesting VO. Please call back.

## 2016-09-24 DIAGNOSIS — S062X0S Diffuse traumatic brain injury without loss of consciousness, sequela: Secondary | ICD-10-CM | POA: Diagnosis not present

## 2016-09-24 DIAGNOSIS — R2689 Other abnormalities of gait and mobility: Secondary | ICD-10-CM | POA: Diagnosis not present

## 2016-09-24 DIAGNOSIS — Z8701 Personal history of pneumonia (recurrent): Secondary | ICD-10-CM | POA: Diagnosis not present

## 2016-09-24 DIAGNOSIS — K219 Gastro-esophageal reflux disease without esophagitis: Secondary | ICD-10-CM | POA: Diagnosis not present

## 2016-09-24 DIAGNOSIS — K759 Inflammatory liver disease, unspecified: Secondary | ICD-10-CM | POA: Diagnosis not present

## 2016-09-24 DIAGNOSIS — H547 Unspecified visual loss: Secondary | ICD-10-CM | POA: Diagnosis not present

## 2016-09-24 DIAGNOSIS — G40909 Epilepsy, unspecified, not intractable, without status epilepticus: Secondary | ICD-10-CM | POA: Diagnosis not present

## 2016-09-24 DIAGNOSIS — Z87891 Personal history of nicotine dependence: Secondary | ICD-10-CM | POA: Diagnosis not present

## 2016-09-24 DIAGNOSIS — J449 Chronic obstructive pulmonary disease, unspecified: Secondary | ICD-10-CM | POA: Diagnosis not present

## 2016-09-25 DIAGNOSIS — K219 Gastro-esophageal reflux disease without esophagitis: Secondary | ICD-10-CM | POA: Diagnosis not present

## 2016-09-25 DIAGNOSIS — J449 Chronic obstructive pulmonary disease, unspecified: Secondary | ICD-10-CM | POA: Diagnosis not present

## 2016-09-25 DIAGNOSIS — S062X0S Diffuse traumatic brain injury without loss of consciousness, sequela: Secondary | ICD-10-CM | POA: Diagnosis not present

## 2016-09-25 DIAGNOSIS — H547 Unspecified visual loss: Secondary | ICD-10-CM | POA: Diagnosis not present

## 2016-09-25 DIAGNOSIS — G40909 Epilepsy, unspecified, not intractable, without status epilepticus: Secondary | ICD-10-CM | POA: Diagnosis not present

## 2016-09-25 DIAGNOSIS — R2689 Other abnormalities of gait and mobility: Secondary | ICD-10-CM | POA: Diagnosis not present

## 2016-09-25 DIAGNOSIS — Z87891 Personal history of nicotine dependence: Secondary | ICD-10-CM | POA: Diagnosis not present

## 2016-09-25 DIAGNOSIS — Z8701 Personal history of pneumonia (recurrent): Secondary | ICD-10-CM | POA: Diagnosis not present

## 2016-09-25 DIAGNOSIS — K759 Inflammatory liver disease, unspecified: Secondary | ICD-10-CM | POA: Diagnosis not present

## 2016-09-28 DIAGNOSIS — H547 Unspecified visual loss: Secondary | ICD-10-CM | POA: Diagnosis not present

## 2016-09-28 DIAGNOSIS — K759 Inflammatory liver disease, unspecified: Secondary | ICD-10-CM | POA: Diagnosis not present

## 2016-09-28 DIAGNOSIS — G40909 Epilepsy, unspecified, not intractable, without status epilepticus: Secondary | ICD-10-CM | POA: Diagnosis not present

## 2016-09-28 DIAGNOSIS — J449 Chronic obstructive pulmonary disease, unspecified: Secondary | ICD-10-CM | POA: Diagnosis not present

## 2016-09-28 DIAGNOSIS — Z87891 Personal history of nicotine dependence: Secondary | ICD-10-CM | POA: Diagnosis not present

## 2016-09-28 DIAGNOSIS — R2689 Other abnormalities of gait and mobility: Secondary | ICD-10-CM | POA: Diagnosis not present

## 2016-09-28 DIAGNOSIS — Z8701 Personal history of pneumonia (recurrent): Secondary | ICD-10-CM | POA: Diagnosis not present

## 2016-09-28 DIAGNOSIS — K219 Gastro-esophageal reflux disease without esophagitis: Secondary | ICD-10-CM | POA: Diagnosis not present

## 2016-09-28 DIAGNOSIS — S062X0S Diffuse traumatic brain injury without loss of consciousness, sequela: Secondary | ICD-10-CM | POA: Diagnosis not present

## 2016-09-29 DIAGNOSIS — K759 Inflammatory liver disease, unspecified: Secondary | ICD-10-CM | POA: Diagnosis not present

## 2016-09-29 DIAGNOSIS — Z8701 Personal history of pneumonia (recurrent): Secondary | ICD-10-CM | POA: Diagnosis not present

## 2016-09-29 DIAGNOSIS — J449 Chronic obstructive pulmonary disease, unspecified: Secondary | ICD-10-CM | POA: Diagnosis not present

## 2016-09-29 DIAGNOSIS — G40909 Epilepsy, unspecified, not intractable, without status epilepticus: Secondary | ICD-10-CM | POA: Diagnosis not present

## 2016-09-29 DIAGNOSIS — Z87891 Personal history of nicotine dependence: Secondary | ICD-10-CM | POA: Diagnosis not present

## 2016-09-29 DIAGNOSIS — K219 Gastro-esophageal reflux disease without esophagitis: Secondary | ICD-10-CM | POA: Diagnosis not present

## 2016-09-29 DIAGNOSIS — R2689 Other abnormalities of gait and mobility: Secondary | ICD-10-CM | POA: Diagnosis not present

## 2016-09-29 DIAGNOSIS — H547 Unspecified visual loss: Secondary | ICD-10-CM | POA: Diagnosis not present

## 2016-09-29 DIAGNOSIS — S062X0S Diffuse traumatic brain injury without loss of consciousness, sequela: Secondary | ICD-10-CM | POA: Diagnosis not present

## 2016-10-01 DIAGNOSIS — K759 Inflammatory liver disease, unspecified: Secondary | ICD-10-CM | POA: Diagnosis not present

## 2016-10-01 DIAGNOSIS — Z8701 Personal history of pneumonia (recurrent): Secondary | ICD-10-CM | POA: Diagnosis not present

## 2016-10-01 DIAGNOSIS — S062X0S Diffuse traumatic brain injury without loss of consciousness, sequela: Secondary | ICD-10-CM | POA: Diagnosis not present

## 2016-10-01 DIAGNOSIS — Z87891 Personal history of nicotine dependence: Secondary | ICD-10-CM | POA: Diagnosis not present

## 2016-10-01 DIAGNOSIS — R2689 Other abnormalities of gait and mobility: Secondary | ICD-10-CM | POA: Diagnosis not present

## 2016-10-01 DIAGNOSIS — H547 Unspecified visual loss: Secondary | ICD-10-CM | POA: Diagnosis not present

## 2016-10-01 DIAGNOSIS — G40909 Epilepsy, unspecified, not intractable, without status epilepticus: Secondary | ICD-10-CM | POA: Diagnosis not present

## 2016-10-01 DIAGNOSIS — K219 Gastro-esophageal reflux disease without esophagitis: Secondary | ICD-10-CM | POA: Diagnosis not present

## 2016-10-01 DIAGNOSIS — J449 Chronic obstructive pulmonary disease, unspecified: Secondary | ICD-10-CM | POA: Diagnosis not present

## 2016-10-05 DIAGNOSIS — H547 Unspecified visual loss: Secondary | ICD-10-CM | POA: Diagnosis not present

## 2016-10-05 DIAGNOSIS — Z87891 Personal history of nicotine dependence: Secondary | ICD-10-CM | POA: Diagnosis not present

## 2016-10-05 DIAGNOSIS — R2689 Other abnormalities of gait and mobility: Secondary | ICD-10-CM | POA: Diagnosis not present

## 2016-10-05 DIAGNOSIS — G40909 Epilepsy, unspecified, not intractable, without status epilepticus: Secondary | ICD-10-CM | POA: Diagnosis not present

## 2016-10-05 DIAGNOSIS — Z8701 Personal history of pneumonia (recurrent): Secondary | ICD-10-CM | POA: Diagnosis not present

## 2016-10-05 DIAGNOSIS — K219 Gastro-esophageal reflux disease without esophagitis: Secondary | ICD-10-CM | POA: Diagnosis not present

## 2016-10-05 DIAGNOSIS — S062X0S Diffuse traumatic brain injury without loss of consciousness, sequela: Secondary | ICD-10-CM | POA: Diagnosis not present

## 2016-10-05 DIAGNOSIS — K759 Inflammatory liver disease, unspecified: Secondary | ICD-10-CM | POA: Diagnosis not present

## 2016-10-05 DIAGNOSIS — J449 Chronic obstructive pulmonary disease, unspecified: Secondary | ICD-10-CM | POA: Diagnosis not present

## 2016-10-06 DIAGNOSIS — Z8701 Personal history of pneumonia (recurrent): Secondary | ICD-10-CM | POA: Diagnosis not present

## 2016-10-06 DIAGNOSIS — K759 Inflammatory liver disease, unspecified: Secondary | ICD-10-CM | POA: Diagnosis not present

## 2016-10-06 DIAGNOSIS — G40909 Epilepsy, unspecified, not intractable, without status epilepticus: Secondary | ICD-10-CM | POA: Diagnosis not present

## 2016-10-06 DIAGNOSIS — J449 Chronic obstructive pulmonary disease, unspecified: Secondary | ICD-10-CM | POA: Diagnosis not present

## 2016-10-06 DIAGNOSIS — S062X0S Diffuse traumatic brain injury without loss of consciousness, sequela: Secondary | ICD-10-CM | POA: Diagnosis not present

## 2016-10-06 DIAGNOSIS — H547 Unspecified visual loss: Secondary | ICD-10-CM | POA: Diagnosis not present

## 2016-10-06 DIAGNOSIS — R2689 Other abnormalities of gait and mobility: Secondary | ICD-10-CM | POA: Diagnosis not present

## 2016-10-06 DIAGNOSIS — K219 Gastro-esophageal reflux disease without esophagitis: Secondary | ICD-10-CM | POA: Diagnosis not present

## 2016-10-06 DIAGNOSIS — Z87891 Personal history of nicotine dependence: Secondary | ICD-10-CM | POA: Diagnosis not present

## 2016-10-07 DIAGNOSIS — H547 Unspecified visual loss: Secondary | ICD-10-CM | POA: Diagnosis not present

## 2016-10-07 DIAGNOSIS — J449 Chronic obstructive pulmonary disease, unspecified: Secondary | ICD-10-CM | POA: Diagnosis not present

## 2016-10-07 DIAGNOSIS — K759 Inflammatory liver disease, unspecified: Secondary | ICD-10-CM | POA: Diagnosis not present

## 2016-10-07 DIAGNOSIS — K219 Gastro-esophageal reflux disease without esophagitis: Secondary | ICD-10-CM | POA: Diagnosis not present

## 2016-10-07 DIAGNOSIS — Z8701 Personal history of pneumonia (recurrent): Secondary | ICD-10-CM | POA: Diagnosis not present

## 2016-10-07 DIAGNOSIS — G40909 Epilepsy, unspecified, not intractable, without status epilepticus: Secondary | ICD-10-CM | POA: Diagnosis not present

## 2016-10-07 DIAGNOSIS — Z87891 Personal history of nicotine dependence: Secondary | ICD-10-CM | POA: Diagnosis not present

## 2016-10-07 DIAGNOSIS — S062X0S Diffuse traumatic brain injury without loss of consciousness, sequela: Secondary | ICD-10-CM | POA: Diagnosis not present

## 2016-10-07 DIAGNOSIS — R2689 Other abnormalities of gait and mobility: Secondary | ICD-10-CM | POA: Diagnosis not present

## 2016-10-08 DIAGNOSIS — Z8701 Personal history of pneumonia (recurrent): Secondary | ICD-10-CM | POA: Diagnosis not present

## 2016-10-08 DIAGNOSIS — K219 Gastro-esophageal reflux disease without esophagitis: Secondary | ICD-10-CM | POA: Diagnosis not present

## 2016-10-08 DIAGNOSIS — J449 Chronic obstructive pulmonary disease, unspecified: Secondary | ICD-10-CM | POA: Diagnosis not present

## 2016-10-08 DIAGNOSIS — K759 Inflammatory liver disease, unspecified: Secondary | ICD-10-CM | POA: Diagnosis not present

## 2016-10-08 DIAGNOSIS — S062X0S Diffuse traumatic brain injury without loss of consciousness, sequela: Secondary | ICD-10-CM | POA: Diagnosis not present

## 2016-10-08 DIAGNOSIS — G40909 Epilepsy, unspecified, not intractable, without status epilepticus: Secondary | ICD-10-CM | POA: Diagnosis not present

## 2016-10-08 DIAGNOSIS — Z87891 Personal history of nicotine dependence: Secondary | ICD-10-CM | POA: Diagnosis not present

## 2016-10-08 DIAGNOSIS — R2689 Other abnormalities of gait and mobility: Secondary | ICD-10-CM | POA: Diagnosis not present

## 2016-10-08 DIAGNOSIS — H547 Unspecified visual loss: Secondary | ICD-10-CM | POA: Diagnosis not present

## 2016-10-12 ENCOUNTER — Telehealth: Payer: Self-pay | Admitting: Neurology

## 2016-10-12 DIAGNOSIS — K759 Inflammatory liver disease, unspecified: Secondary | ICD-10-CM | POA: Diagnosis not present

## 2016-10-12 DIAGNOSIS — Z87891 Personal history of nicotine dependence: Secondary | ICD-10-CM | POA: Diagnosis not present

## 2016-10-12 DIAGNOSIS — S062X0S Diffuse traumatic brain injury without loss of consciousness, sequela: Secondary | ICD-10-CM | POA: Diagnosis not present

## 2016-10-12 DIAGNOSIS — J449 Chronic obstructive pulmonary disease, unspecified: Secondary | ICD-10-CM | POA: Diagnosis not present

## 2016-10-12 DIAGNOSIS — Z8701 Personal history of pneumonia (recurrent): Secondary | ICD-10-CM | POA: Diagnosis not present

## 2016-10-12 DIAGNOSIS — H547 Unspecified visual loss: Secondary | ICD-10-CM | POA: Diagnosis not present

## 2016-10-12 DIAGNOSIS — K219 Gastro-esophageal reflux disease without esophagitis: Secondary | ICD-10-CM | POA: Diagnosis not present

## 2016-10-12 DIAGNOSIS — R2689 Other abnormalities of gait and mobility: Secondary | ICD-10-CM | POA: Diagnosis not present

## 2016-10-12 DIAGNOSIS — G40909 Epilepsy, unspecified, not intractable, without status epilepticus: Secondary | ICD-10-CM | POA: Diagnosis not present

## 2016-10-12 NOTE — Telephone Encounter (Signed)
Patient wife called and states that she needs to talk to someone about what is going on with the patient

## 2016-10-12 NOTE — Telephone Encounter (Signed)
Returned call.  No answer.  LMOM asking pt wife to call the office back

## 2016-10-13 DIAGNOSIS — K219 Gastro-esophageal reflux disease without esophagitis: Secondary | ICD-10-CM | POA: Diagnosis not present

## 2016-10-13 DIAGNOSIS — J449 Chronic obstructive pulmonary disease, unspecified: Secondary | ICD-10-CM | POA: Diagnosis not present

## 2016-10-13 DIAGNOSIS — K759 Inflammatory liver disease, unspecified: Secondary | ICD-10-CM | POA: Diagnosis not present

## 2016-10-13 DIAGNOSIS — R2689 Other abnormalities of gait and mobility: Secondary | ICD-10-CM | POA: Diagnosis not present

## 2016-10-13 DIAGNOSIS — Z8701 Personal history of pneumonia (recurrent): Secondary | ICD-10-CM | POA: Diagnosis not present

## 2016-10-13 DIAGNOSIS — S062X0S Diffuse traumatic brain injury without loss of consciousness, sequela: Secondary | ICD-10-CM | POA: Diagnosis not present

## 2016-10-13 DIAGNOSIS — G40909 Epilepsy, unspecified, not intractable, without status epilepticus: Secondary | ICD-10-CM | POA: Diagnosis not present

## 2016-10-13 DIAGNOSIS — H547 Unspecified visual loss: Secondary | ICD-10-CM | POA: Diagnosis not present

## 2016-10-13 DIAGNOSIS — Z87891 Personal history of nicotine dependence: Secondary | ICD-10-CM | POA: Diagnosis not present

## 2016-10-13 NOTE — Telephone Encounter (Signed)
Spoke with pt's wife, Todd [name], and pt's OT, Todd [name], as he was there for therapy.  They state that once pt started more intensive OT he started having "pins and needles" in his legs when he sits too long, and that his legs go numb when he stands.  Todd [name] states it only happens when a 90 degree angle is breached.  He is wondering if it could be stenosis or some kind of lumbar compression.  Pt has Hx of lumbar issues.  Todd [name] states that pt is doing really well with therapy, up walking 40-50 at a time now.

## 2016-10-13 NOTE — Telephone Encounter (Signed)
PT called and said she was returning your call

## 2016-10-14 ENCOUNTER — Telehealth: Payer: Self-pay | Admitting: Neurology

## 2016-10-14 NOTE — Telephone Encounter (Signed)
Would like a call, missed call yesterday from [place] and called back after 4:30.  Pat can't stand.  Please call.
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
## 2016-10-15 ENCOUNTER — Telehealth: Payer: Self-pay | Admitting: Internal Medicine

## 2016-10-15 DIAGNOSIS — G40909 Epilepsy, unspecified, not intractable, without status epilepticus: Secondary | ICD-10-CM | POA: Diagnosis not present

## 2016-10-15 DIAGNOSIS — Z87891 Personal history of nicotine dependence: Secondary | ICD-10-CM | POA: Diagnosis not present

## 2016-10-15 DIAGNOSIS — Z8701 Personal history of pneumonia (recurrent): Secondary | ICD-10-CM | POA: Diagnosis not present

## 2016-10-15 DIAGNOSIS — K759 Inflammatory liver disease, unspecified: Secondary | ICD-10-CM | POA: Diagnosis not present

## 2016-10-15 DIAGNOSIS — J449 Chronic obstructive pulmonary disease, unspecified: Secondary | ICD-10-CM | POA: Diagnosis not present

## 2016-10-15 DIAGNOSIS — K219 Gastro-esophageal reflux disease without esophagitis: Secondary | ICD-10-CM | POA: Diagnosis not present

## 2016-10-15 DIAGNOSIS — R2689 Other abnormalities of gait and mobility: Secondary | ICD-10-CM | POA: Diagnosis not present

## 2016-10-15 DIAGNOSIS — H547 Unspecified visual loss: Secondary | ICD-10-CM | POA: Diagnosis not present

## 2016-10-15 DIAGNOSIS — S062X0S Diffuse traumatic brain injury without loss of consciousness, sequela: Secondary | ICD-10-CM | POA: Diagnosis not present

## 2016-10-15 NOTE — Telephone Encounter (Signed)
Vo for next visit of PT to be postponed til the following week, are you good w/ this?

## 2016-10-15 NOTE — Telephone Encounter (Signed)
That will be fine. 

## 2016-10-15 NOTE — Telephone Encounter (Signed)
[facility] Physical Therapist requesting VO.

## 2016-10-16 ENCOUNTER — Telehealth: Payer: Self-pay | Admitting: Internal Medicine

## 2016-10-16 DIAGNOSIS — K759 Inflammatory liver disease, unspecified: Secondary | ICD-10-CM | POA: Diagnosis not present

## 2016-10-16 DIAGNOSIS — H547 Unspecified visual loss: Secondary | ICD-10-CM | POA: Diagnosis not present

## 2016-10-16 DIAGNOSIS — G40909 Epilepsy, unspecified, not intractable, without status epilepticus: Secondary | ICD-10-CM | POA: Diagnosis not present

## 2016-10-16 DIAGNOSIS — J449 Chronic obstructive pulmonary disease, unspecified: Secondary | ICD-10-CM | POA: Diagnosis not present

## 2016-10-16 DIAGNOSIS — K219 Gastro-esophageal reflux disease without esophagitis: Secondary | ICD-10-CM | POA: Diagnosis not present

## 2016-10-16 DIAGNOSIS — S062X0S Diffuse traumatic brain injury without loss of consciousness, sequela: Secondary | ICD-10-CM | POA: Diagnosis not present

## 2016-10-16 DIAGNOSIS — Z8701 Personal history of pneumonia (recurrent): Secondary | ICD-10-CM | POA: Diagnosis not present

## 2016-10-16 DIAGNOSIS — Z87891 Personal history of nicotine dependence: Secondary | ICD-10-CM | POA: Diagnosis not present

## 2016-10-16 DIAGNOSIS — R2689 Other abnormalities of gait and mobility: Secondary | ICD-10-CM | POA: Diagnosis not present

## 2016-10-16 NOTE — Telephone Encounter (Signed)
   Reason for call:   I received a call from the wife of [name] at 5:10 PM indicating that he has been more fatigued lately.   Pertinent Data:   Pt has a hx of TBI, aspiration pneumonia, frontal lobe encephaolmalacia who reportedly has been feeling more weak and fatigued at home.  PT and OT have been working with him.  They suggested he may have pneumonia due to feeling weak and fatigued again like prior pneumonia  Wife states his vitals have been stable and he's been afebrile.  He sounds "gurgly" but this is not abnormal for him.   Assessment / Plan / Recommendations:   We discussed ED visit vs close clinic f/u  She feels comfortable observing him at home for now and bringing him to the ED if things change.  Advised to call clinic Monday for appointment if he ends up not coming to the ED.  As always, pt is advised that if symptoms worsen or new symptoms arise, they should go to an urgent care facility or to to ER for further evaluation.   [name], DO   10/16/2016, 5:11 PM

## 2016-10-19 DIAGNOSIS — K759 Inflammatory liver disease, unspecified: Secondary | ICD-10-CM | POA: Diagnosis not present

## 2016-10-19 DIAGNOSIS — J449 Chronic obstructive pulmonary disease, unspecified: Secondary | ICD-10-CM | POA: Diagnosis not present

## 2016-10-19 DIAGNOSIS — Z87891 Personal history of nicotine dependence: Secondary | ICD-10-CM | POA: Diagnosis not present

## 2016-10-19 DIAGNOSIS — S062X0S Diffuse traumatic brain injury without loss of consciousness, sequela: Secondary | ICD-10-CM | POA: Diagnosis not present

## 2016-10-19 DIAGNOSIS — H547 Unspecified visual loss: Secondary | ICD-10-CM | POA: Diagnosis not present

## 2016-10-19 DIAGNOSIS — Z8701 Personal history of pneumonia (recurrent): Secondary | ICD-10-CM | POA: Diagnosis not present

## 2016-10-19 DIAGNOSIS — R2689 Other abnormalities of gait and mobility: Secondary | ICD-10-CM | POA: Diagnosis not present

## 2016-10-19 DIAGNOSIS — K219 Gastro-esophageal reflux disease without esophagitis: Secondary | ICD-10-CM | POA: Diagnosis not present

## 2016-10-19 DIAGNOSIS — G40909 Epilepsy, unspecified, not intractable, without status epilepticus: Secondary | ICD-10-CM | POA: Diagnosis not present

## 2016-10-20 ENCOUNTER — Ambulatory Visit (INDEPENDENT_AMBULATORY_CARE_PROVIDER_SITE_OTHER): Payer: Medicare HMO | Admitting: Neurology

## 2016-10-20 ENCOUNTER — Encounter: Payer: Self-pay | Admitting: Neurology

## 2016-10-20 VITALS — BP 118/70 | HR 95 | Ht 71.0 in | Wt 185.0 lb

## 2016-10-20 DIAGNOSIS — R200 Anesthesia of skin: Secondary | ICD-10-CM

## 2016-10-20 DIAGNOSIS — M5440 Lumbago with sciatica, unspecified side: Secondary | ICD-10-CM | POA: Diagnosis not present

## 2016-10-20 DIAGNOSIS — G40109 Localization-related (focal) (partial) symptomatic epilepsy and epileptic syndromes with simple partial seizures, not intractable, without status epilepticus: Secondary | ICD-10-CM

## 2016-10-20 DIAGNOSIS — R202 Paresthesia of skin: Secondary | ICD-10-CM | POA: Diagnosis not present

## 2016-10-20 NOTE — Patient Instructions (Addendum)
1. Bloodwork for TSH, B12 2. Schedule MRI lumbar spine without contrast 3. Continue with all your medications 4. Continue with PT and OT 5. Follow-up on 12/16/16 at 3:30pm
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
## 2016-10-22 ENCOUNTER — Ambulatory Visit (HOSPITAL_COMMUNITY)
Admission: RE | Admit: 2016-10-22 | Discharge: 2016-10-22 | Disposition: A | Discharge status: Home / Self Care | Payer: Non-veteran care | Source: Ambulatory Visit | Attending: Internal Medicine | Admitting: Internal Medicine

## 2016-10-22 ENCOUNTER — Encounter: Payer: Self-pay | Admitting: Internal Medicine

## 2016-10-22 ENCOUNTER — Ambulatory Visit (INDEPENDENT_AMBULATORY_CARE_PROVIDER_SITE_OTHER): Payer: Medicare HMO | Admitting: Internal Medicine

## 2016-10-22 VITALS — BP 109/66 | HR 85 | Ht 72.0 in | Wt 187.6 lb

## 2016-10-22 DIAGNOSIS — G9529 Other cord compression: Secondary | ICD-10-CM | POA: Insufficient documentation

## 2016-10-22 DIAGNOSIS — R05 Cough: Secondary | ICD-10-CM | POA: Insufficient documentation

## 2016-10-22 DIAGNOSIS — R200 Anesthesia of skin: Secondary | ICD-10-CM

## 2016-10-22 DIAGNOSIS — R059 Cough, unspecified: Secondary | ICD-10-CM | POA: Insufficient documentation

## 2016-10-22 DIAGNOSIS — R202 Paresthesia of skin: Secondary | ICD-10-CM | POA: Diagnosis not present

## 2016-10-22 DIAGNOSIS — Z299 Encounter for prophylactic measures, unspecified: Secondary | ICD-10-CM

## 2016-10-22 MED ORDER — HYDROCOD POLST-CPM POLST ER 10-8 MG/5ML PO SUER: 5.0000 mL | Freq: Two times a day (BID) | ORAL | 0 refills | Status: DC | PRN
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
## 2016-10-22 NOTE — Patient Instructions (Signed)
It was a pleasure to see you today Todd [name].  We will check a chest xray for your persistent cough to make sure there is no new infiltrate or problem in the lung from aspiration or infection.  I have printed a new prescription for your cough medication as needed.  We unfortunately do not carry the Shingrix vaccine here. You have not yet met the insurance deductible for this year so it would be costly at this time at retail pharmacy.

## 2016-10-23 ENCOUNTER — Telehealth: Payer: Self-pay | Admitting: Neurology

## 2016-10-23 ENCOUNTER — Telehealth: Payer: Self-pay

## 2016-10-23 DIAGNOSIS — Z8701 Personal history of pneumonia (recurrent): Secondary | ICD-10-CM | POA: Diagnosis not present

## 2016-10-23 DIAGNOSIS — J449 Chronic obstructive pulmonary disease, unspecified: Secondary | ICD-10-CM | POA: Diagnosis not present

## 2016-10-23 DIAGNOSIS — R2689 Other abnormalities of gait and mobility: Secondary | ICD-10-CM | POA: Diagnosis not present

## 2016-10-23 DIAGNOSIS — H547 Unspecified visual loss: Secondary | ICD-10-CM | POA: Diagnosis not present

## 2016-10-23 DIAGNOSIS — S062X0S Diffuse traumatic brain injury without loss of consciousness, sequela: Secondary | ICD-10-CM | POA: Diagnosis not present

## 2016-10-23 DIAGNOSIS — Z87891 Personal history of nicotine dependence: Secondary | ICD-10-CM | POA: Diagnosis not present

## 2016-10-23 DIAGNOSIS — K759 Inflammatory liver disease, unspecified: Secondary | ICD-10-CM | POA: Diagnosis not present

## 2016-10-23 DIAGNOSIS — K219 Gastro-esophageal reflux disease without esophagitis: Secondary | ICD-10-CM | POA: Diagnosis not present

## 2016-10-23 DIAGNOSIS — G40909 Epilepsy, unspecified, not intractable, without status epilepticus: Secondary | ICD-10-CM | POA: Diagnosis not present

## 2016-10-23 NOTE — Telephone Encounter (Signed)
Spoke with pt wife.  She said she has not heard from [facility] to schedule MRI. Let her know that she can call them directly.

## 2016-10-23 NOTE — Telephone Encounter (Signed)
Patient wife said we were to have [name] she thought to call her but she has not heard anything yet please call

## 2016-10-23 NOTE — Telephone Encounter (Signed)
Spoke with pt wife, [name].  She states that while on the phone with Shelley that she was told pt would need some labwork done prior to his MRI.  I let her know that yes, he would need b12 and TSH levels done.  She states I did not tell her this in the office, but it was on the AVS.  I apologized for the miscommunication.  Let her know that pt does not need an appointment for labs but he is going to need to come up to our front desk and check in before going to the lab.  She verbalized understanding.

## 2016-10-26 ENCOUNTER — Encounter: Payer: Self-pay | Admitting: Neurology

## 2016-10-26 DIAGNOSIS — Z87891 Personal history of nicotine dependence: Secondary | ICD-10-CM | POA: Diagnosis not present

## 2016-10-26 DIAGNOSIS — Z8701 Personal history of pneumonia (recurrent): Secondary | ICD-10-CM | POA: Diagnosis not present

## 2016-10-26 DIAGNOSIS — K759 Inflammatory liver disease, unspecified: Secondary | ICD-10-CM | POA: Diagnosis not present

## 2016-10-26 DIAGNOSIS — H547 Unspecified visual loss: Secondary | ICD-10-CM | POA: Diagnosis not present

## 2016-10-26 DIAGNOSIS — J449 Chronic obstructive pulmonary disease, unspecified: Secondary | ICD-10-CM | POA: Diagnosis not present

## 2016-10-26 DIAGNOSIS — G40909 Epilepsy, unspecified, not intractable, without status epilepticus: Secondary | ICD-10-CM | POA: Diagnosis not present

## 2016-10-26 DIAGNOSIS — S062X0S Diffuse traumatic brain injury without loss of consciousness, sequela: Secondary | ICD-10-CM | POA: Diagnosis not present

## 2016-10-26 DIAGNOSIS — R202 Paresthesia of skin: Secondary | ICD-10-CM

## 2016-10-26 DIAGNOSIS — R2689 Other abnormalities of gait and mobility: Secondary | ICD-10-CM | POA: Diagnosis not present

## 2016-10-26 DIAGNOSIS — R200 Anesthesia of skin: Secondary | ICD-10-CM | POA: Insufficient documentation

## 2016-10-26 DIAGNOSIS — K219 Gastro-esophageal reflux disease without esophagitis: Secondary | ICD-10-CM | POA: Diagnosis not present

## 2016-10-26 NOTE — Assessment & Plan Note (Signed)
HPI: He has a long history of cough with multiple previous aspiration pneumonias. There was no specific incident that the patient and his wife recall preceding a worsening of this cough recently. This recent cough is mostly nonproductive and not associated with any fevers, chills, chest pain, or severe dyspnea. Symptoms have been well-controlled with hydrocodone-chlorpheniramine when necessary. It has been worse than usual for now almost 3 months time with some intermittent variation.  A: Increase in chronic cough. I suspect a high risk for aspiration pneumonitis although infection is a possibility and has been a complication for him in the past related to his persistent neurologic deficits including right facial weakness and dysphagia. I'll plan to get chest imaging to exclude any focal trait or consolidation that would need antibiotic treatment, and if negative we can continue with supportive measures.  P: CXR today Reordered hydrocodone-chlorpheniramine PRN, 148mL

## 2016-10-26 NOTE — Assessment & Plan Note (Signed)
He is having episodic numbness and tingling in the lower extremities particularly with prolonged sitting before trying to stand. This problem is being evaluated in neurology clinic by Dr. Delice Lesch with anticipated MRI in the near future.

## 2016-10-26 NOTE — Progress Notes (Signed)
Internal Medicine Clinic Attending  I saw and evaluated the patient.  I personally confirmed the key portions of the history and exam documented by Dr. Rice and I reviewed pertinent patient test results.  The assessment, diagnosis, and plan were formulated together and I agree with the documentation in the resident's note.  

## 2016-10-26 NOTE — Assessment & Plan Note (Addendum)
He and his wife are interested in shingrix vaccine for shingles at this time. We do not carry this in clinic but I recommended they could receive this at [facility]. Possibly not at this time as their medicare deductible would still be expensive based on our clinical pharmacist's test charge but that would be the current option, versus pursuing this later in the year.

## 2016-10-27 ENCOUNTER — Other Ambulatory Visit: Payer: Medicare HMO

## 2016-10-27 DIAGNOSIS — R200 Anesthesia of skin: Secondary | ICD-10-CM | POA: Diagnosis not present

## 2016-10-27 DIAGNOSIS — M5440 Lumbago with sciatica, unspecified side: Secondary | ICD-10-CM

## 2016-10-27 DIAGNOSIS — R202 Paresthesia of skin: Secondary | ICD-10-CM

## 2016-10-27 DIAGNOSIS — G40109 Localization-related (focal) (partial) symptomatic epilepsy and epileptic syndromes with simple partial seizures, not intractable, without status epilepticus: Secondary | ICD-10-CM

## 2016-10-28 ENCOUNTER — Emergency Department (HOSPITAL_COMMUNITY): Payer: Medicare HMO

## 2016-10-28 ENCOUNTER — Inpatient Hospital Stay (HOSPITAL_COMMUNITY)
Admission: EM | Admit: 2016-10-28 | Discharge: 2016-11-06 | DRG: 552 | Disposition: A | Discharge status: Skilled Nursing Facility | Payer: Medicare HMO | Attending: Internal Medicine | Admitting: Internal Medicine

## 2016-10-28 ENCOUNTER — Encounter (HOSPITAL_COMMUNITY): Payer: Self-pay | Admitting: Emergency Medicine

## 2016-10-28 ENCOUNTER — Other Ambulatory Visit: Payer: Self-pay

## 2016-10-28 ENCOUNTER — Observation Stay (HOSPITAL_COMMUNITY): Payer: Medicare HMO

## 2016-10-28 DIAGNOSIS — Z888 Allergy status to other drugs, medicaments and biological substances status: Secondary | ICD-10-CM

## 2016-10-28 DIAGNOSIS — R1310 Dysphagia, unspecified: Secondary | ICD-10-CM | POA: Diagnosis present

## 2016-10-28 DIAGNOSIS — M549 Dorsalgia, unspecified: Secondary | ICD-10-CM | POA: Diagnosis present

## 2016-10-28 DIAGNOSIS — R531 Weakness: Secondary | ICD-10-CM | POA: Diagnosis not present

## 2016-10-28 DIAGNOSIS — M5440 Lumbago with sciatica, unspecified side: Secondary | ICD-10-CM | POA: Diagnosis present

## 2016-10-28 DIAGNOSIS — M4856XA Collapsed vertebra, not elsewhere classified, lumbar region, initial encounter for fracture: Secondary | ICD-10-CM | POA: Diagnosis present

## 2016-10-28 DIAGNOSIS — G9389 Other specified disorders of brain: Secondary | ICD-10-CM | POA: Diagnosis present

## 2016-10-28 DIAGNOSIS — H5461 Unqualified visual loss, right eye, normal vision left eye: Secondary | ICD-10-CM | POA: Diagnosis present

## 2016-10-28 DIAGNOSIS — Z7902 Long term (current) use of antithrombotics/antiplatelets: Secondary | ICD-10-CM

## 2016-10-28 DIAGNOSIS — R292 Abnormal reflex: Secondary | ICD-10-CM | POA: Diagnosis present

## 2016-10-28 DIAGNOSIS — Z886 Allergy status to analgesic agent status: Secondary | ICD-10-CM | POA: Diagnosis not present

## 2016-10-28 DIAGNOSIS — R278 Other lack of coordination: Secondary | ICD-10-CM | POA: Diagnosis not present

## 2016-10-28 DIAGNOSIS — K279 Peptic ulcer, site unspecified, unspecified as acute or chronic, without hemorrhage or perforation: Secondary | ICD-10-CM | POA: Diagnosis present

## 2016-10-28 DIAGNOSIS — R29704 NIHSS score 4: Secondary | ICD-10-CM | POA: Diagnosis present

## 2016-10-28 DIAGNOSIS — F329 Major depressive disorder, single episode, unspecified: Secondary | ICD-10-CM | POA: Diagnosis present

## 2016-10-28 DIAGNOSIS — M48062 Spinal stenosis, lumbar region with neurogenic claudication: Secondary | ICD-10-CM | POA: Diagnosis present

## 2016-10-28 DIAGNOSIS — Z79899 Other long term (current) drug therapy: Secondary | ICD-10-CM | POA: Diagnosis not present

## 2016-10-28 DIAGNOSIS — K5900 Constipation, unspecified: Secondary | ICD-10-CM | POA: Diagnosis present

## 2016-10-28 DIAGNOSIS — Z87820 Personal history of traumatic brain injury: Secondary | ICD-10-CM | POA: Diagnosis not present

## 2016-10-28 DIAGNOSIS — R471 Dysarthria and anarthria: Secondary | ICD-10-CM | POA: Diagnosis present

## 2016-10-28 DIAGNOSIS — R32 Unspecified urinary incontinence: Secondary | ICD-10-CM | POA: Diagnosis present

## 2016-10-28 DIAGNOSIS — M479 Spondylosis, unspecified: Secondary | ICD-10-CM | POA: Diagnosis present

## 2016-10-28 DIAGNOSIS — N390 Urinary tract infection, site not specified: Secondary | ICD-10-CM | POA: Diagnosis present

## 2016-10-28 DIAGNOSIS — M79651 Pain in right thigh: Secondary | ICD-10-CM | POA: Diagnosis not present

## 2016-10-28 DIAGNOSIS — J449 Chronic obstructive pulmonary disease, unspecified: Secondary | ICD-10-CM | POA: Diagnosis present

## 2016-10-28 DIAGNOSIS — Z7982 Long term (current) use of aspirin: Secondary | ICD-10-CM | POA: Diagnosis not present

## 2016-10-28 DIAGNOSIS — M48061 Spinal stenosis, lumbar region without neurogenic claudication: Secondary | ICD-10-CM | POA: Diagnosis not present

## 2016-10-28 DIAGNOSIS — M545 Low back pain: Secondary | ICD-10-CM | POA: Diagnosis not present

## 2016-10-28 DIAGNOSIS — R569 Unspecified convulsions: Secondary | ICD-10-CM | POA: Diagnosis not present

## 2016-10-28 DIAGNOSIS — M8008XA Age-related osteoporosis with current pathological fracture, vertebra(e), initial encounter for fracture: Secondary | ICD-10-CM | POA: Diagnosis not present

## 2016-10-28 DIAGNOSIS — Z7952 Long term (current) use of systemic steroids: Secondary | ICD-10-CM | POA: Diagnosis not present

## 2016-10-28 DIAGNOSIS — I2510 Atherosclerotic heart disease of native coronary artery without angina pectoris: Secondary | ICD-10-CM | POA: Diagnosis not present

## 2016-10-28 DIAGNOSIS — R202 Paresthesia of skin: Secondary | ICD-10-CM | POA: Diagnosis present

## 2016-10-28 DIAGNOSIS — Z885 Allergy status to narcotic agent status: Secondary | ICD-10-CM | POA: Diagnosis not present

## 2016-10-28 DIAGNOSIS — R269 Unspecified abnormalities of gait and mobility: Secondary | ICD-10-CM | POA: Diagnosis not present

## 2016-10-28 DIAGNOSIS — R270 Ataxia, unspecified: Secondary | ICD-10-CM

## 2016-10-28 DIAGNOSIS — Z7951 Long term (current) use of inhaled steroids: Secondary | ICD-10-CM

## 2016-10-28 DIAGNOSIS — M6281 Muscle weakness (generalized): Secondary | ICD-10-CM | POA: Diagnosis not present

## 2016-10-28 DIAGNOSIS — M810 Age-related osteoporosis without current pathological fracture: Secondary | ICD-10-CM | POA: Diagnosis present

## 2016-10-28 DIAGNOSIS — R404 Transient alteration of awareness: Secondary | ICD-10-CM | POA: Diagnosis not present

## 2016-10-28 DIAGNOSIS — R2689 Other abnormalities of gait and mobility: Secondary | ICD-10-CM | POA: Diagnosis not present

## 2016-10-28 DIAGNOSIS — G40909 Epilepsy, unspecified, not intractable, without status epilepticus: Secondary | ICD-10-CM | POA: Diagnosis present

## 2016-10-28 DIAGNOSIS — Z79891 Long term (current) use of opiate analgesic: Secondary | ICD-10-CM

## 2016-10-28 DIAGNOSIS — R200 Anesthesia of skin: Secondary | ICD-10-CM

## 2016-10-28 DIAGNOSIS — Z87891 Personal history of nicotine dependence: Secondary | ICD-10-CM

## 2016-10-28 DIAGNOSIS — Z7983 Long term (current) use of bisphosphonates: Secondary | ICD-10-CM

## 2016-10-28 DIAGNOSIS — S069X0S Unspecified intracranial injury without loss of consciousness, sequela: Secondary | ICD-10-CM | POA: Diagnosis not present

## 2016-10-28 DIAGNOSIS — M79652 Pain in left thigh: Secondary | ICD-10-CM | POA: Diagnosis not present

## 2016-10-28 DIAGNOSIS — R1312 Dysphagia, oropharyngeal phase: Secondary | ICD-10-CM | POA: Diagnosis not present

## 2016-10-28 LAB — URINALYSIS, ROUTINE W REFLEX MICROSCOPIC
BILIRUBIN URINE: NEGATIVE
Glucose, UA: NEGATIVE mg/dL
HGB URINE DIPSTICK: NEGATIVE
KETONES UR: NEGATIVE mg/dL
NITRITE: POSITIVE — AB
PH: 7 (ref 5.0–8.0)
Protein, ur: NEGATIVE mg/dL
Specific Gravity, Urine: 1.017 (ref 1.005–1.030)

## 2016-10-28 LAB — CBC
HCT: 43.8 % (ref 39.0–52.0)
Hemoglobin: 14.1 g/dL (ref 13.0–17.0)
MCH: 32.4 pg (ref 26.0–34.0)
MCHC: 32.2 g/dL (ref 30.0–36.0)
MCV: 100.7 fL — AB (ref 78.0–100.0)
PLATELETS: 208 10*3/uL (ref 150–400)
RBC: 4.35 MIL/uL (ref 4.22–5.81)
RDW: 13.3 % (ref 11.5–15.5)
WBC: 7.4 10*3/uL (ref 4.0–10.5)

## 2016-10-28 LAB — COMPREHENSIVE METABOLIC PANEL
ALT: 27 U/L (ref 17–63)
ANION GAP: 7 (ref 5–15)
AST: 47 U/L — ABNORMAL HIGH (ref 15–41)
Albumin: 3.5 g/dL (ref 3.5–5.0)
Alkaline Phosphatase: 44 U/L (ref 38–126)
BUN: 17 mg/dL (ref 6–20)
CHLORIDE: 109 mmol/L (ref 101–111)
CO2: 25 mmol/L (ref 22–32)
Calcium: 9.1 mg/dL (ref 8.9–10.3)
Creatinine, Ser: 1 mg/dL (ref 0.61–1.24)
GFR calc Af Amer: >60 mL/min (ref >60)
GFR calc non Af Amer: >60 mL/min (ref >60)
Glucose, Bld: 116 mg/dL — ABNORMAL HIGH (ref 65–99)
Potassium: 4 mmol/L (ref 3.5–5.1)
SODIUM: 141 mmol/L (ref 135–145)
Total Bilirubin: 1 mg/dL (ref 0.3–1.2)
Total Protein: 6.4 g/dL — ABNORMAL LOW (ref 6.5–8.1)

## 2016-10-28 LAB — I-STAT CHEM 8, ED
BUN: 21 mg/dL — ABNORMAL HIGH (ref 6–20)
CALCIUM ION: 1.15 mmol/L (ref 1.15–1.40)
CHLORIDE: 107 mmol/L (ref 101–111)
Creatinine, Ser: 0.9 mg/dL (ref 0.61–1.24)
Glucose, Bld: 112 mg/dL — ABNORMAL HIGH (ref 65–99)
HEMATOCRIT: 41 % (ref 39.0–52.0)
Hemoglobin: 13.9 g/dL (ref 13.0–17.0)
POTASSIUM: 4 mmol/L (ref 3.5–5.1)
SODIUM: 144 mmol/L (ref 135–145)
TCO2: 26 mmol/L (ref 0–100)

## 2016-10-28 LAB — TSH: TSH: 2.11 mIU/L (ref 0.40–4.50)

## 2016-10-28 LAB — RAPID URINE DRUG SCREEN, HOSP PERFORMED
Amphetamines: NOT DETECTED
BARBITURATES: NOT DETECTED
Benzodiazepines: NOT DETECTED
COCAINE: NOT DETECTED
OPIATES: NOT DETECTED
Tetrahydrocannabinol: NOT DETECTED

## 2016-10-28 LAB — DIFFERENTIAL
BASOS PCT: 1 %
Basophils Absolute: 0 10*3/uL (ref 0.0–0.1)
EOS PCT: 4 %
Eosinophils Absolute: 0.3 10*3/uL (ref 0.0–0.7)
Lymphocytes Relative: 24 %
Lymphs Abs: 1.8 10*3/uL (ref 0.7–4.0)
Monocytes Absolute: 0.6 10*3/uL (ref 0.1–1.0)
Monocytes Relative: 8 %
NEUTROS ABS: 4.7 10*3/uL (ref 1.7–7.7)
NEUTROS PCT: 63 %

## 2016-10-28 LAB — I-STAT TROPONIN, ED: TROPONIN I, POC: 0 ng/mL (ref 0.00–0.08)

## 2016-10-28 LAB — PROTIME-INR
INR: 1.11
PROTHROMBIN TIME: 14.3 s (ref 11.4–15.2)

## 2016-10-28 LAB — APTT: aPTT: 36 seconds (ref 24–36)

## 2016-10-28 LAB — VITAMIN B12: VITAMIN B 12: 863 pg/mL (ref 200–1100)

## 2016-10-28 LAB — ETHANOL

## 2016-10-28 MED ORDER — ATORVASTATIN CALCIUM 80 MG PO TABS
80.0000 mg | ORAL_TABLET | Freq: Every day | ORAL | Status: DC
  Administered 2016-10-28 – 2016-11-05 (×9): 80 mg via ORAL
  Filled 2016-10-28 (×10): qty 1

## 2016-10-28 MED ORDER — BIOTIN 1000 MCG PO TABS: 1000.0000 ug | ORAL_TABLET | Freq: Every day | ORAL | Status: DC

## 2016-10-28 MED ORDER — DEXTROSE 5 % IV SOLN
1.0000 g | INTRAVENOUS | Status: DC
  Administered 2016-10-28: 1 g via INTRAVENOUS
  Filled 2016-10-28: qty 10

## 2016-10-28 MED ORDER — VITAMIN C 500 MG PO TABS
1000.0000 mg | ORAL_TABLET | Freq: Every day | ORAL | Status: DC
  Administered 2016-10-29 – 2016-11-06 (×9): 1000 mg via ORAL
  Filled 2016-10-28 (×9): qty 2

## 2016-10-28 MED ORDER — FAMOTIDINE 20 MG PO TABS
20.0000 mg | ORAL_TABLET | Freq: Every day | ORAL | Status: DC
  Administered 2016-10-29 – 2016-11-06 (×9): 20 mg via ORAL
  Filled 2016-10-28 (×10): qty 1

## 2016-10-28 MED ORDER — LAMOTRIGINE 100 MG PO TABS
100.0000 mg | ORAL_TABLET | Freq: Every day | ORAL | Status: DC
  Administered 2016-10-28 – 2016-11-05 (×9): 100 mg via ORAL
  Filled 2016-10-28 (×10): qty 1

## 2016-10-28 MED ORDER — HYDROCOD POLST-CPM POLST ER 10-8 MG/5ML PO SUER
5.0000 mL | Freq: Two times a day (BID) | ORAL | Status: DC | PRN
  Administered 2016-10-29: 5 mL via ORAL
  Filled 2016-10-28: qty 5

## 2016-10-28 MED ORDER — CLOPIDOGREL BISULFATE 75 MG PO TABS
75.0000 mg | ORAL_TABLET | Freq: Every morning | ORAL | Status: DC
  Administered 2016-10-29: 75 mg via ORAL
  Filled 2016-10-28 (×2): qty 1

## 2016-10-28 MED ORDER — GADOBENATE DIMEGLUMINE 529 MG/ML IV SOLN
20.0000 mL | Freq: Once | INTRAVENOUS | Status: AC
  Administered 2016-10-28: 18 mL via INTRAVENOUS

## 2016-10-28 MED ORDER — ADULT MULTIVITAMIN W/MINERALS CH
1.0000 | ORAL_TABLET | Freq: Every day | ORAL | Status: DC
  Administered 2016-10-29 – 2016-11-06 (×9): 1 via ORAL
  Filled 2016-10-28 (×9): qty 1

## 2016-10-28 MED ORDER — FLUTICASONE PROPIONATE 50 MCG/ACT NA SUSP
1.0000 | Freq: Every day | NASAL | Status: DC
  Administered 2016-10-29 – 2016-11-06 (×8): 1 via NASAL
  Filled 2016-10-28: qty 16

## 2016-10-28 MED ORDER — LAMOTRIGINE 25 MG PO TABS
50.0000 mg | ORAL_TABLET | Freq: Every day | ORAL | Status: DC
  Administered 2016-10-29 – 2016-11-06 (×9): 50 mg via ORAL
  Filled 2016-10-28 (×9): qty 2

## 2016-10-28 MED ORDER — ASPIRIN 81 MG PO CHEW
81.0000 mg | CHEWABLE_TABLET | Freq: Every day | ORAL | Status: DC
  Administered 2016-10-29 – 2016-11-06 (×9): 81 mg via ORAL
  Filled 2016-10-28 (×9): qty 1

## 2016-10-28 MED ORDER — ENOXAPARIN SODIUM 40 MG/0.4ML ~~LOC~~ SOLN
40.0000 mg | SUBCUTANEOUS | Status: DC
  Administered 2016-10-28 – 2016-11-02 (×6): 40 mg via SUBCUTANEOUS
  Filled 2016-10-28 (×6): qty 0.4

## 2016-10-28 MED ORDER — TIOTROPIUM BROMIDE MONOHYDRATE 18 MCG IN CAPS
18.0000 ug | ORAL_CAPSULE | Freq: Every day | RESPIRATORY_TRACT | Status: DC
  Administered 2016-10-29 – 2016-11-02 (×2): 18 ug via RESPIRATORY_TRACT
  Filled 2016-10-28: qty 5

## 2016-10-28 MED ORDER — ORAL CARE MOUTH RINSE
15.0000 mL | Freq: Two times a day (BID) | OROMUCOSAL | Status: DC
  Administered 2016-10-29 – 2016-11-05 (×13): 15 mL via OROMUCOSAL

## 2016-10-28 MED ORDER — B COMPLEX-C PO TABS
1.0000 | ORAL_TABLET | Freq: Every day | ORAL | Status: DC
  Administered 2016-10-29 – 2016-11-06 (×9): 1 via ORAL
  Filled 2016-10-28 (×9): qty 1

## 2016-10-28 MED ORDER — CEFAZOLIN SODIUM-DEXTROSE 1-4 GM/50ML-% IV SOLN
1.0000 g | Freq: Three times a day (TID) | INTRAVENOUS | Status: DC
  Administered 2016-10-29 – 2016-11-01 (×11): 1 g via INTRAVENOUS
  Filled 2016-10-28 (×12): qty 50

## 2016-10-28 MED ORDER — GUAIFENESIN 200 MG PO TABS
400.0000 mg | ORAL_TABLET | Freq: Two times a day (BID) | ORAL | Status: DC
  Administered 2016-10-28 – 2016-11-06 (×18): 400 mg via ORAL
  Filled 2016-10-28 (×20): qty 2

## 2016-10-28 MED ORDER — CO-ENZYME Q-10 100 MG PO CAPS: 100.0000 mg | ORAL_CAPSULE | Freq: Every day | ORAL | Status: DC

## 2016-10-28 MED ORDER — VITAMIN D 1000 UNITS PO TABS
2000.0000 [IU] | ORAL_TABLET | Freq: Every day | ORAL | Status: DC
  Administered 2016-10-29 – 2016-11-06 (×9): 2000 [IU] via ORAL
  Filled 2016-10-28 (×9): qty 2

## 2016-10-28 MED ORDER — ISOSORBIDE MONONITRATE ER 30 MG PO TB24
30.0000 mg | ORAL_TABLET | Freq: Every day | ORAL | Status: DC
  Administered 2016-10-29 – 2016-11-06 (×9): 30 mg via ORAL
  Filled 2016-10-28 (×9): qty 1

## 2016-10-28 MED ORDER — LORATADINE 10 MG PO TABS
5.0000 mg | ORAL_TABLET | Freq: Every evening | ORAL | Status: DC
  Administered 2016-10-28 – 2016-11-05 (×9): 5 mg via ORAL
  Filled 2016-10-28 (×9): qty 1

## 2016-10-28 MED ORDER — ALBUTEROL SULFATE (2.5 MG/3ML) 0.083% IN NEBU: 3.0000 mL | INHALATION_SOLUTION | Freq: Four times a day (QID) | RESPIRATORY_TRACT | Status: DC | PRN

## 2016-10-28 MED ORDER — LIDOCAINE 5 % EX PTCH
1.0000 | MEDICATED_PATCH | CUTANEOUS | Status: DC
  Administered 2016-10-29 – 2016-11-06 (×9): 1 via TRANSDERMAL
  Filled 2016-10-28 (×9): qty 1

## 2016-10-28 MED ORDER — OXYCODONE HCL 5 MG PO TABS
2.5000 mg | ORAL_TABLET | ORAL | Status: DC | PRN
  Administered 2016-10-28 – 2016-10-29 (×2): 5 mg via ORAL
  Administered 2016-10-29: 2.5 mg via ORAL
  Filled 2016-10-28 (×3): qty 1

## 2016-10-28 NOTE — ED Notes (Signed)
pts wife requesting that we do not do in/out catheter on patient, condom catheter placed on patient.

## 2016-10-28 NOTE — Progress Notes (Signed)
PHARMACIST - PHYSICIAN ORDER COMMUNICATION  CONCERNING: P&T Medication Policy on Herbal Medications  DESCRIPTION:  This patient's order for:  Coenzyme-Q and Biotin has been noted.  This product(s) is classified as an "herbal" or natural product. Due to a lack of definitive safety studies or FDA approval, nonstandard manufacturing practices, plus the potential risk of unknown drug-drug interactions while on inpatient medications, the Pharmacy and Therapeutics Committee does not permit the use of "herbal" or natural products of this type within [facility].   ACTION TAKEN: The pharmacy department is unable to verify this order at this time. Please reevaluate patient's clinical condition at discharge and address if the herbal or natural product(s) should be resumed at that time.    Isabellamarie Randa D. Mina Marble, PharmD, [place] Pager:  [phone] 10/28/2016, 8:33 PM

## 2016-10-28 NOTE — ED Triage Notes (Signed)
Pt arrives from home where he lives with his wife, hx of TBI, family reported to ems that patient has had progressive generalized weakness for the past 3 weeks, spoke with pts neurologist who set up MRI in a few weeks for pt. Ems reports pt wife stated that he became much more weak last night. Normally can stand and bear weight but today was unable to.

## 2016-10-28 NOTE — Progress Notes (Signed)
Pharmacy Antibiotic Note  Todd [name] is a 71 y.o. male admitted on 10/28/2016 with UTI.  Pharmacy has been consulted for Cefazolin dosing. Hx of pan-sensitive E Coli, WBC WNL, renal function good.   Plan: -Cefazolin 1g IV q8h -Trend WBC, temp, renal function  -F/U urine culture for directed therapy  Weight: 180 lb 11.2 oz (82 kg)  Temp (24hrs), Avg:97.8 F (36.6 C), Min:97.6 F (36.4 C), Max:97.9 F (36.6 C)   Recent Labs Lab 10/28/16 1515 10/28/16 1548  WBC 7.4  --   CREATININE 1.00 0.90    Estimated Creatinine Clearance: 85 mL/min (by C-G formula based on SCr of 0.9 mg/dL).    Allergies  Allergen Reactions  . Acetaminophen Other (See Comments)    Pt is unable to take this medication due to liver disease.    Todd [name] [Naproxen] Other (See Comments)    Pt is unable to take this medication due to liver disease.    . Beta Adrenergic Blockers Other (See Comments)    Reaction:  Disorientation   . Codeine Hives and Other (See Comments)    Reaction:  Inflammation  . Metoprolol Other (See Comments)    Reaction:  Dizziness   . Nsaids Other (See Comments)    Pt is unable to take this medication due to liver disease.      Todd [name], Todd [name] 10/28/2016 10:03 PM

## 2016-10-28 NOTE — ED Notes (Signed)
Family at bedside. 

## 2016-10-28 NOTE — ED Notes (Signed)
The pts wife reports that the pt has difficulty swallowing already and has to have thickened liquids and routinely has to havfe barium swallows.  He has had a recent one

## 2016-10-28 NOTE — Progress Notes (Signed)
Amitted from ED. Wife at bedside. Assessment as charted. Only belongings at bedside are clothes.

## 2016-10-28 NOTE — Progress Notes (Signed)
New Admission Note:    Arrival Method: STretcher from ER Mental Orientation:  Non Verbal TBI Assessment: In progress Skin: Intact AP:OLID FA Pain:0/10 Safety Measures:Side rails up bed in low position Admission: In progress 6E Orientation:In progress Family:  Wife on way  Orders have been reviewed and implemented.  Will continue to monitor the patient.

## 2016-10-28 NOTE — ED Notes (Signed)
pts wife went home to return but the pt took that time to pull off all his leads bp cuff and pulse ox with all his cover

## 2016-10-28 NOTE — ED Notes (Signed)
Report cALLED TO RN ON 6E

## 2016-10-28 NOTE — ED Notes (Signed)
The pts wife wants   Him to have deep suctioning because he has difficulty getting up his secretions intermittently.  He had a chest xray  3 days ago and it was clear

## 2016-10-28 NOTE — ED Notes (Signed)
Patient transported to CT 
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
## 2016-10-28 NOTE — Progress Notes (Signed)
Per RN, pt's wife wants him to be NT suctioned. Pt has history of TBI and prior trach and has difficulty clearing secretions. Pt NT suctioned x2 through Right nare as RT met resistance in left nare. Very minimal amount of clear/pink tinged secretions removed
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
## 2016-10-29 ENCOUNTER — Telehealth: Payer: Self-pay

## 2016-10-29 DIAGNOSIS — N390 Urinary tract infection, site not specified: Secondary | ICD-10-CM | POA: Diagnosis present

## 2016-10-29 DIAGNOSIS — H5461 Unqualified visual loss, right eye, normal vision left eye: Secondary | ICD-10-CM | POA: Diagnosis present

## 2016-10-29 DIAGNOSIS — Z7951 Long term (current) use of inhaled steroids: Secondary | ICD-10-CM | POA: Diagnosis not present

## 2016-10-29 DIAGNOSIS — Z886 Allergy status to analgesic agent status: Secondary | ICD-10-CM | POA: Diagnosis not present

## 2016-10-29 DIAGNOSIS — M48061 Spinal stenosis, lumbar region without neurogenic claudication: Secondary | ICD-10-CM | POA: Diagnosis not present

## 2016-10-29 DIAGNOSIS — Z87891 Personal history of nicotine dependence: Secondary | ICD-10-CM | POA: Diagnosis not present

## 2016-10-29 DIAGNOSIS — G9389 Other specified disorders of brain: Secondary | ICD-10-CM | POA: Diagnosis present

## 2016-10-29 DIAGNOSIS — Z7902 Long term (current) use of antithrombotics/antiplatelets: Secondary | ICD-10-CM | POA: Diagnosis not present

## 2016-10-29 DIAGNOSIS — M4856XA Collapsed vertebra, not elsewhere classified, lumbar region, initial encounter for fracture: Secondary | ICD-10-CM | POA: Diagnosis present

## 2016-10-29 DIAGNOSIS — J449 Chronic obstructive pulmonary disease, unspecified: Secondary | ICD-10-CM | POA: Diagnosis present

## 2016-10-29 DIAGNOSIS — R269 Unspecified abnormalities of gait and mobility: Secondary | ICD-10-CM | POA: Diagnosis not present

## 2016-10-29 DIAGNOSIS — M5440 Lumbago with sciatica, unspecified side: Secondary | ICD-10-CM | POA: Diagnosis present

## 2016-10-29 DIAGNOSIS — Z7952 Long term (current) use of systemic steroids: Secondary | ICD-10-CM | POA: Diagnosis not present

## 2016-10-29 DIAGNOSIS — Z79891 Long term (current) use of opiate analgesic: Secondary | ICD-10-CM | POA: Diagnosis not present

## 2016-10-29 DIAGNOSIS — M545 Low back pain: Secondary | ICD-10-CM | POA: Diagnosis not present

## 2016-10-29 DIAGNOSIS — Z87820 Personal history of traumatic brain injury: Secondary | ICD-10-CM | POA: Diagnosis not present

## 2016-10-29 DIAGNOSIS — R292 Abnormal reflex: Secondary | ICD-10-CM | POA: Diagnosis present

## 2016-10-29 DIAGNOSIS — M48062 Spinal stenosis, lumbar region with neurogenic claudication: Secondary | ICD-10-CM | POA: Diagnosis present

## 2016-10-29 DIAGNOSIS — R32 Unspecified urinary incontinence: Secondary | ICD-10-CM | POA: Diagnosis present

## 2016-10-29 DIAGNOSIS — S069X0S Unspecified intracranial injury without loss of consciousness, sequela: Secondary | ICD-10-CM | POA: Diagnosis not present

## 2016-10-29 DIAGNOSIS — R270 Ataxia, unspecified: Secondary | ICD-10-CM | POA: Diagnosis present

## 2016-10-29 DIAGNOSIS — M549 Dorsalgia, unspecified: Secondary | ICD-10-CM | POA: Diagnosis present

## 2016-10-29 DIAGNOSIS — Z79899 Other long term (current) drug therapy: Secondary | ICD-10-CM | POA: Diagnosis not present

## 2016-10-29 DIAGNOSIS — Z7982 Long term (current) use of aspirin: Secondary | ICD-10-CM | POA: Diagnosis not present

## 2016-10-29 DIAGNOSIS — Z888 Allergy status to other drugs, medicaments and biological substances status: Secondary | ICD-10-CM | POA: Diagnosis not present

## 2016-10-29 DIAGNOSIS — R202 Paresthesia of skin: Secondary | ICD-10-CM | POA: Diagnosis present

## 2016-10-29 DIAGNOSIS — R1310 Dysphagia, unspecified: Secondary | ICD-10-CM | POA: Diagnosis present

## 2016-10-29 DIAGNOSIS — Z885 Allergy status to narcotic agent status: Secondary | ICD-10-CM | POA: Diagnosis not present

## 2016-10-29 MED ORDER — DULOXETINE HCL 30 MG PO CPEP
30.0000 mg | ORAL_CAPSULE | Freq: Every day | ORAL | Status: DC
  Administered 2016-10-29 – 2016-11-06 (×9): 30 mg via ORAL
  Filled 2016-10-29 (×9): qty 1

## 2016-10-29 NOTE — Progress Notes (Signed)
   Subjective: Feels well this morning, eating breakfast and resting in bed comfortably.   Objective:  Vital signs in last 24 hours: Vitals:   10/28/16 2230 10/29/16 0846 10/29/16 1021 10/29/16 1100  BP: 136/76  108/75   Pulse: 75  88   Resp: 16  18   Temp: (!) 97.5 F (36.4 C)  97.7 F (36.5 C)   TempSrc: Oral  Oral   SpO2: 96% 97% 95%   Weight:      Height:    5\' 10"  (1.778 m)   Physical Exam  Constitutional: He appears well-developed and well-nourished.  HENT:  Head: Normocephalic and atraumatic.  Cardiovascular: Normal rate, regular rhythm and normal heart sounds.   Pulmonary/Chest: Effort normal and breath sounds normal.  Abdominal: Soft. Bowel sounds are normal.  Neurological:  Strength: R leg 4+/5, L leg 5/5. Sensation: intact throughout   Active Problems:   Ataxia   Numbness  Assessment/Plan: 1. 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 Per wife, patient has known history of compression fractures, which were managed non-surgically. These findings may certainly explain his ongoing numbness and weakness and may be amenable to surgical intervention. Discussed whether wife would like to pursue surgery vs non-surgical intervention if that is indicated and she indicated would like to discuss further with surgical specialists. Will consult neurosurgery and await recs.  - F/u neurosurgery consult   2. Complicated UTI: Patient has a U/A with +nitrites and +leuk esterase. Could consider this as etiology of worsening mobility. Will treat empirically for complicated UTI.  -Continue ancef  1 g q8hr x 5 days (day 2)  Urinary incontinence: Patient uses diapers at home, using condom cath here and will continue. Treating for UTI as above. 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 Continue home lamotrigine dose of 50 mg in AM and 100 mg in PM.  Peptic ulcer disease: Continue home pepcid 20 mg QD.   CV risk: Continue lipitor 40 mg and plavix 75 mg qd.  COPD: Continue home spiriva.  MDD: Continue home cymbalta.  Dispo: Anticipated discharge in approximately >1 day(s).   Lethea Killings, Medical Student 10/29/2016, 1:27 PM Pager: [phone]

## 2016-10-29 NOTE — H&P (Signed)
Internal Medicine Attending Admission Note Date: 10/29/2016  Patient name: Todd [name] Medical record number: [id number] Date of birth: 02-05-1946 Age: 71 y.o. Gender: male  I saw and evaluated the patient. I reviewed the resident's note and I agree with the resident's findings and plan as documented in the resident's note.  Chief Complaint(s): Inability to stand 2 days  History - key components related to admission:  Todd [name] is a 71 year old man with a history of traumatic brain injury, osteoporosis, and numerous falls who presents with a 2 day history of inability to stand. For the previous 2 months he had been working with physical therapy and occupational therapy and had been able to transfer from the bedside to a walker and from a walker to a wheelchair with minimal assistance. However, over the past 2 days he developed weakness of the legs and required total support in order to make the transfers he used to make with minimal assistance. He is incontinent of urine at baseline and is intermittently incontinent of stool for several years. Thus, there is been no noticeable change in bowel or bladder habits. There has also been no acute trauma over the previous 2 days but he has had increasing back pain requiring more than his baseline oxycodone. Finally, he/his wife denies any fevers, chills, or recent significant infections. He was brought to the emergency department with the above complaint and was admitted to the internal medicine teaching service for further evaluation and care.  An MRI of the lumbar spine was obtained after admission and revealed severe spinal canal stenosis at L3-L4 and severe bilateral neural foraminal stenosis due to a combination of disc bulge, posterior vertebral wall retropulsion, and moderate facet arthrosis. He was without other acute complaints at this time.  Physical Exam - key components related to admission:  Vitals:   10/28/16 2230 10/29/16 0846  10/29/16 1021 10/29/16 1100  BP: 136/76  108/75   Pulse: 75  88   Resp: 16  18   Temp: (!) 97.5 F (36.4 C)  97.7 F (36.5 C)   TempSrc: Oral  Oral   SpO2: 96% 97% 95%   Weight:      Height:    5\' 10"  (1.778 m)   Gen.: Well-developed, well-nourished, man lying comfortably in bed eating breakfast in no acute distress. Neuro: Right leg 4+/5 strength, left leg 5/5 strength. Bilateral lower extremity sensation intact to light touch. 3+ hyperreflexia at the patella on the right. 3 - hyperreflexia at the patella on the left.  Lab results:  Basic Metabolic Panel:  Recent Labs  10/28/16 1515 10/28/16 1548  NA 141 144  K 4.0 4.0  CL 109 107  CO2 25  --   GLUCOSE 116* 112*  BUN 17 21*  CREATININE 1.00 0.90  CALCIUM 9.1  --    Liver Function Tests:  Recent Labs  10/28/16 1515  AST 47*  ALT 27  ALKPHOS 44  BILITOT 1.0  PROT 6.4*  ALBUMIN 3.5   CBC:  Recent Labs  10/28/16 1515 10/28/16 1548  WBC 7.4  --   NEUTROABS 4.7  --   HGB 14.1 13.9  HCT 43.8 41.0  MCV 100.7*  --   PLT 208  --    Thyroid Function Tests:  Recent Labs  10/27/16 1339  TSH 2.11   Anemia Panel:  Recent Labs  10/27/16 1339  VITAMINB12 863   Coagulation:  Recent Labs  10/28/16 1515  INR 1.11   Urine Drug Screen:  Negative for opiates, cocaine, benzodiazepines, amphetamines, THC, and barbiturates.  Alcohol Level:  Recent Labs  10/28/16 [place] <5   Urinalysis:  Amber, cloudy, specific gravity 1.017, pH 7.0, nitrate positive, leukocytes large, red blood cells 0-5 per high-power field, white blood cells too numerous to count  Imaging results:  Ct Head Wo Contrast  Result Date: 10/28/2016 CLINICAL DATA:  Ataxia. EXAM: CT HEAD WITHOUT CONTRAST TECHNIQUE: Contiguous axial images were obtained from the base of the skull through the vertex without intravenous contrast. COMPARISON:  01/20/2016 FINDINGS: Brain: Prior right craniotomy. There is diffuse cerebral atrophy with  extensive encephalomalacia in the bilateral frontal lobes and the right temporal lobe. Negative for acute hemorrhage, mass lesion, midline shift or new large infarct. Stable enlargement of the ventricles, particularly the right temporal horn. Vascular: No hyperdense vessel or unexpected calcification. Skull: Prior right craniotomy.  No acute fracture. Sinuses/Orbits: No acute sinus disease. Other: None. IMPRESSION: No acute intracranial abnormality. Chronic atrophy with large areas of encephalomalacia as described. Prior right craniotomy. Electronically Signed   By: Markus Daft M.D.   On: 10/28/2016 14:51   Mr Lumbar Spine W Wo Contrast  Result Date: 10/28/2016 CLINICAL DATA:  New onset bilateral lower extremity weakness. EXAM: MRI LUMBAR SPINE WITHOUT AND WITH CONTRAST TECHNIQUE: Multiplanar and multiecho pulse sequences of the lumbar spine were obtained without and with intravenous contrast. CONTRAST:  6mL MULTIHANCE GADOBENATE DIMEGLUMINE 529 MG/ML IV SOLN COMPARISON:  Lumbar spine radiograph 10/24/2014 and 11/20/2014 FINDINGS: Segmentation:  Standard Alignment: 4 mm of retropulsion of the posterior walled L3. Alignment otherwise normal. Vertebrae: There are chronic compression deformities of the T12-L3 vertebral bodies, unchanged compared to radiograph from 11/20/2014. No acute osseous abnormality. Conus medullaris: Extends to the lower L1 level and appears normal. Paraspinal and other soft tissues: Multiple subcentimeter left renal cyst. Otherwise unremarkable. Disc levels: T10-L1: These levels are evaluated on sagittal sequences only. There is no disc herniation, spinal canal stenosis or neural foraminal stenosis. L1-L2: There is marked disc space narrowing with a small bulge. No spinal canal stenosis. No neural foraminal stenosis. L2-L3: There is a small diffuse disc bulge with mild spinal canal stenosis. Normal facets. No neural foraminal stenosis. L3-L4: There is retropulsion of the inferior posterior  wall the L3 vertebrae, an intermediate sized disc bulge and moderate bilateral facet hypertrophy the results in severe spinal canal stenosis, effacing the CSF space an crowding the cauda equina nerve roots. There is severe bilateral neural foraminal stenosis. L4-L5: Disc desiccation with medium-sized diffuse bulge. Mild facet hypertrophy. Mild spinal canal stenosis. Moderate bilateral neural foraminal stenosis. L5-S1: Normal disc. Mild facet hypertrophy. No spinal canal or neural foraminal stenosis. Visualized sacrum: Normal. No abnormal contrast enhancement. IMPRESSION: 1. Severe spinal canal stenosis at L3-L4 and severe bilateral neural foraminal stenosis due to a combination of disc bulge, posterior vertebral wall retropulsion and moderate facet arthrosis. 2. Mild spinal canal stenosis at L2-L3 and L4-L5 with moderate bilateral L4-L5 neural foraminal stenosis. 3. Unchanged appearance of T12, L1, L2 and L3 compression fractures, as compared to 11/20/2014. Electronically Signed   By: Ulyses Jarred M.D.   On: 10/28/2016 22:07   I did not personally review the films myself, but reviewed the radiologist's interpretation.  Other results:  EKG: Normal sinus rhythm at 83 bpm, normal axis, normal intervals, no significant Q waves, no LVH by voltage, good R wave progression, no ST or T-wave changes. No significant change from the previous ECG on 03/24/2015.  Assessment & Plan by Problem:  Mr. Egelston is a 70 year old man with a history of traumatic brain injury, osteoporosis, and numerous falls who presents with a 2 day history of inability to stand. 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 As there are subtle neurologic exam findings and significant functional impairment per the patient and his wife's history he likely will benefit from assessment of his  candidacy for surgical repair of his severe lumbar stenosis.  1) L3-L4 severe lumbar stenosis: We are consulting neurosurgery to assess his candidacy for surgical intervention to repair his severe stenosis. We are hopeful they can also provide alternatives, if any that could be considered as well.  2) Disposition: Pending the results of the neurosurgical assessment and decision as to whether or not interventions are necessary and appropriate.

## 2016-10-29 NOTE — Care Management Obs Status (Signed)
[place] NOTIFICATION   Patient Details  Name: Todd [name] MRN: [id number] Date of Birth: June 21, 1945   Medicare Observation Status Notification Given:  Yes    [name], RN 10/29/2016, 1:56 PM

## 2016-10-29 NOTE — Progress Notes (Signed)
[facility]  Patient Status: Active (receiving services up to time of hospitalization)  [facility] is providing the following services: PT and OT  If patient discharges after hours, please call [phone].   [name] 10/29/2016, 9:56 AM

## 2016-10-29 NOTE — Progress Notes (Signed)
OT Cancellation Note  Patient Details Name: Todd [name] MRN: [id number] DOB: 12/20/1945   Cancelled Treatment:    Reason Eval/Treat Not Completed: Medical issues which prohibited therapy.  As per PT notes, will await clearance by neuro.    [name], OTR/L [phone]   [name] 10/29/2016, 11:05 AM

## 2016-10-29 NOTE — Care Management Note (Addendum)
Case Management Note  Patient Details  Name: Todd [name] MRN: [id number] Date of Birth: [birth year]  Subjective/Objective:                 Extensive Conversation with wife at the bedside. Patient with long history of TBI. Cared for at home by wife with HHA provided through [place]. Active w [facility] for [facility]. Wife states patient has [place] to [facility] in the past. Discussed dispo options. At this time patient does not qualify for inpatient status. (Depending on treatment plan and patient condition this may change, please consult Case Management) Patient does not meet admission requirements for LTAC or Inpatient rehab at this time for reasons including but not limited to observation status. Wife states that he cannot walk, and she can no longer provide total care in the home unless he has more mobility. She states that she would prefer [facility] for SNF (SNF not limited by observation as patient has a managed medicare plan) or use "30 respit days through [place]" at DC. Patient has all DME needed at home, with the exception of possibly needing a hospital bed, hoyer, and trapeze. CM will place consult for CSW.  PCP [facility] [address] Rowe Pavy CSW [place] Misch[place] [phone] ext [phone]   Action/Plan:   Expected Discharge Date:  10/30/16               Expected Discharge Plan:  [place]  In-House Referral:     Discharge planning Services  CM Consult  Post Acute Care Choice:    Choice offered to:  Spouse  DME Arranged:    DME Agency:     HH Arranged:    [place] Agency:     Status of Service:  In process, will continue to follow  If discussed at Long Length of Stay Meetings, dates discussed:    Additional Comments:  [name], RN 10/29/2016, 1:56 PM

## 2016-10-29 NOTE — Telephone Encounter (Signed)
Received after hours phone message (10pm on July 3) stating that pt was experiencing numbness and weakness in lower extremities.  Wife declined nurse triage.  Wife stated that pt could not wait for his scheduled MRI on 7/17 and requested emergency MRI.  [facility] was taken [facility] July 4 where he received CT Head and MRI Lumbar spine.  Please see notes

## 2016-10-29 NOTE — Progress Notes (Signed)
PT Cancellation Note  Patient Details Name: Todd [name] MRN: [id number] DOB: 26-Feb-1946   Cancelled Treatment:    Reason Eval/Treat Not Completed: Medical issues which prohibited therapy. Per wife, pt has compression in spinal canal and therapy is on hold until pt sees neurologist. Not documented in chart, will check back once cleared to mobilize.    Scheryl Marten PT, DPT  [phone]  10/29/2016, 11:03 AM

## 2016-10-29 NOTE — Progress Notes (Signed)
SLP Cancellation Note  Patient Details Name: Todd [name] MRN: [id number] DOB: [birth year]   Cancelled treatment:       Reason Eval/Treat Not Completed: Other (comment). Briefly met with pts wife. She feels he is tolerating honey thick liquids and upgrade to mechanical soft solids well, has been doing respiratory muscle strength training. She is agreeable to MBS while an inpatient for possible die upgrade given potential for improvement over the past 3 months of therapy. Will proceed with MBS tomorrow at 1 pm when wife can attend.   Herbie [place], [place] CCC-SLP [phone]  Lynann Beaver 10/29/2016, 10:44 AM
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
## 2016-10-29 NOTE — Progress Notes (Signed)
Placed on low bed due to high fall risk,multiple falls at home.

## 2016-10-30 ENCOUNTER — Inpatient Hospital Stay (HOSPITAL_COMMUNITY): Payer: Medicare HMO

## 2016-10-30 DIAGNOSIS — R269 Unspecified abnormalities of gait and mobility: Secondary | ICD-10-CM

## 2016-10-30 DIAGNOSIS — R270 Ataxia, unspecified: Secondary | ICD-10-CM

## 2016-10-30 LAB — FOLATE RBC
Folate, Hemolysate: >620 ng/mL
Folate, RBC: >1412 ng/mL (ref >498)
HEMATOCRIT: 43.9 % (ref 37.5–51.0)

## 2016-10-30 MED ORDER — OXYCODONE HCL 5 MG PO TABS: 2.5000 mg | ORAL_TABLET | ORAL | Status: DC | PRN

## 2016-10-30 MED ORDER — HYDROCORTISONE 1 % EX CREA
TOPICAL_CREAM | Freq: Two times a day (BID) | CUTANEOUS | Status: DC
  Administered 2016-10-30 – 2016-11-02 (×6): via TOPICAL
  Administered 2016-11-03: 1 via TOPICAL
  Administered 2016-11-04 – 2016-11-06 (×4): via TOPICAL
  Filled 2016-10-30: qty 28

## 2016-10-30 NOTE — Progress Notes (Signed)
Rehab Admissions Coordinator Note:  Patient was screened by Retta Diones for appropriateness for an Inpatient Acute Rehab Consult.  At this time, we are recommending Inpatient Rehab consult.  Jodell Cipro M 10/30/2016, 2:12 PM  I can be reached at [phone].

## 2016-10-30 NOTE — Progress Notes (Signed)
Internal Medicine Attending  Date: 10/30/2016  Patient name: Todd [name] Medical record number: [id number] Date of birth: [birth year] Age: 71 y.o. Gender: male  I saw and evaluated the patient. I reviewed the resident/sub-intern's note by Dr. Amparo Bristol. [place] and I agree with the resident's findings and plans as documented their progress note.  The patient's wife continues to have numerous questions. We will continue to try to provide her with the access to information she desires so she can make informed decisions.

## 2016-10-30 NOTE — Evaluation (Signed)
Physical Therapy Evaluation Patient Details Name: Todd [name] MRN: [id number] DOB: May 11, 1945 Today's Date: 10/30/2016   History of Present Illness  Pt is a 71 yo male admitted through ED on 10/28/16 when he was unable to transfer. 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 PMH significant for TBI s/p 1991, multiple falls, siezures.    Clinical Impression  Pt presents with the above diagnosis and below deficits for therapy evaluation. Prior to admission, pt was living with his wife in a well set-up home with caregivers 4 hrs daily and was receiving PT and OT services. Pt requires assistance for all mobility and ADLs, but was able to transfer with 1 person assist. Currently, pt requires Max A +2 for safety with transfers and short distance gait activities. Pt will benefit from continued acute PT services in order to address the below deficits. Family would really like pt to go to inpatient rehab and will benefit from a possible consult in order to improve mobility before returning home.     Follow Up Recommendations CIR    Equipment Recommendations  None recommended by PT    Recommendations for Other Services Rehab consult     Precautions / Restrictions Precautions Precautions: Fall Restrictions Weight Bearing Restrictions: No      Mobility  Bed Mobility Overal bed mobility: Needs Assistance Bed Mobility: Supine to Sit     Supine to sit: +2 for physical assistance;Mod assist     General bed mobility comments: Max-Mod A to achieve sitting EOB with cues for mobility  Transfers Overall transfer level: Needs assistance Equipment used: Rolling walker (2 wheeled) Transfers: Sit to/from [employer] Sit to Stand: +2 physical assistance;Mod assist   Squat pivot transfers: Mod assist;+2 physical  assistance     General transfer comment: Mod A +2 from EOB and BSC with cues for pushing up. Mod A with increased cues and sequencing to turn and sit in recliner  Ambulation/Gait Ambulation/Gait assistance: Mod assist;+2 physical assistance;+2 safety/equipment Ambulation Distance (Feet): 20 Feet Assistive device: Rolling walker (2 wheeled) Gait Pattern/deviations: Step-to pattern;Wide base of support Gait velocity: decreased Gait velocity interpretation: Below normal speed for age/gender General Gait Details: pt requires increased assistance to sequencing and perform gait with RW. manual and vcs for proximity through [employer]            Wheelchair Mobility    Modified Rankin (Stroke Patients Only)       Balance Overall balance assessment: Needs assistance Sitting-balance support: No upper extremity supported;Feet supported Sitting balance-Leahy Scale: Fair     Standing balance support: Bilateral upper extremity supported Standing balance-Leahy Scale: Poor                               Pertinent Vitals/Pain Pain Assessment: No/denies pain    Home Living Family/patient expects to be discharged to:: Skilled nursing facility Living Arrangements: Spouse/significant other               Additional Comments: has walker, BSC, platform RW, lift chair, grab bars in home, completely set up ith all equipment at home.     Prior Function Level of Independence: Needs assistance   Gait / Transfers Assistance Needed: performs short distance gait with RW  ADL's / Homemaking Assistance Needed: Pt requires max  A with ADLs/selfcare. Can feed self, however required sup and cues to safely eat portions per RN discussion with pt's wife        Hand Dominance   Dominant Hand: Right    Extremity/Trunk Assessment   Upper Extremity Assessment Upper Extremity Assessment: Defer to OT evaluation    Lower Extremity Assessment Lower Extremity Assessment:  Generalized weakness    Cervical / Trunk Assessment Cervical / Trunk Assessment: Normal  Communication   Communication: Expressive difficulties  Cognition Arousal/Alertness: Awake/alert Behavior During Therapy: WFL for tasks assessed/performed Overall Cognitive Status: History of cognitive impairments - at baseline                                        General Comments      Exercises     Assessment/Plan    PT Assessment Patient needs continued PT services  PT Problem List Decreased strength;Decreased activity tolerance;Decreased balance;Decreased mobility;Decreased coordination;Decreased cognition;Decreased knowledge of use of DME;Decreased safety awareness       PT Treatment Interventions DME instruction;Gait training;Functional mobility training;Therapeutic activities;Therapeutic exercise;Balance training;Neuromuscular re-education    PT Goals (Current goals can be found in the Care Plan section)  Acute Rehab PT Goals Patient Stated Goal: to got o rehab and then home PT Goal Formulation: With family Time For Goal Achievement: 11/13/16 Potential to Achieve Goals: Good    Frequency Min 2X/week   Barriers to discharge        Co-evaluation PT/OT/SLP Co-Evaluation/Treatment: Yes Reason for Co-Treatment: Complexity of the patient's impairments (multi-system involvement);Necessary to address cognition/behavior during functional activity;For patient/therapist safety;To address functional/ADL transfers PT goals addressed during session: Mobility/safety with mobility         AM-PAC PT "6 Clicks" Daily Activity  Outcome Measure Difficulty turning over in bed (including adjusting bedclothes, sheets and blankets)?: Total Difficulty moving from lying on back to sitting on the side of the bed? : Total Difficulty sitting down on and standing up from a chair with arms (e.g., wheelchair, bedside commode, etc,.)?: Total Help needed moving to and from a bed to  chair (including a wheelchair)?: A Lot Help needed walking in hospital room?: A Lot Help needed climbing 3-5 steps with a railing? : Total 6 Click Score: 8    End of Session Equipment Utilized During Treatment: Gait belt Activity Tolerance: Patient tolerated treatment well Patient left: in chair;with call bell/phone within reach;with chair alarm set;with family/visitor present Nurse Communication: Mobility status PT Visit Diagnosis: Unsteadiness on feet (R26.81);Ataxic gait (R26.0);History of falling (Z91.81)    Time: 1610-9604 PT Time Calculation (min) (ACUTE ONLY): 47 min   Charges:   PT Evaluation $PT Eval Moderate Complexity: 1 Procedure PT Treatments $Gait Training: 8-22 mins   PT G Codes:        Scheryl Marten PT, DPT  [phone]   Jacqulyn Liner Sloan Leiter 10/30/2016, 1:15 PM

## 2016-10-30 NOTE — Progress Notes (Signed)
Modified Barium Swallow Progress Note  Patient Details  Name: SHERILL WEGENER MRN: [id number] Date of Birth: [birth year]  Today's Date: 10/30/2016  Modified Barium Swallow completed.  Full report located under Chart Review in the Imaging Section.  Brief recommendations include the following:  Clinical Impression  Pt demonstrates stable dysphagia, no major change from prior MBS 3 months prior. Perhaps pt with increased oral coordination for mastication and improved base of tongue strength, also likely improved force of protective cued cough/throat clear. Primary problem is delayed swallow initiation with aspiraton of nectar and honey before the swallow with intermittent sensation. Pt typically drinks honey liquids from a wide straw at home, though this could not be reproduced in this test as straws too narrow for passage of honey, thus it was administered via teaspoon. A chin tuck did decrease aspriation before the swallow as pt otherwise is slightly posterior leaning contributing to rapid spill of liquids to larynx. Chin tuck also seemed to improve epiglottic deflection. Recommend pt continue honey thick liquids with small straw sips, chin tuck, intermittent throat clear. Mechanical soft solids also now recommended. Pt would benefit from ongoing SLP interventions to train new chin tuck strategy and monitor rate with pts typical adaptive cup/straw.    Swallow Evaluation Recommendations       SLP Diet Recommendations: Dysphagia 3 (Mech soft) solids;Honey thick liquids   Liquid Administration via: Straw;Spoon   Medication Administration: Crushed with puree   Supervision: Staff to assist with self feeding;Full supervision/cueing for compensatory strategies   Compensations: Slow rate;Small sips/bites;Chin tuck       Oral Care Recommendations: Oral care BID   Other Recommendations: Order thickener from pharmacy    Zohaib Heeney, Katherene Ponto 10/30/2016,2:51 PM
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
## 2016-10-30 NOTE — Progress Notes (Signed)
   Subjective: No acute events overnight. [name] wife wanted to discuss further with consultants today regarding options for back pain. Discussed extensively about discharge planning and that it is pending PT eval.   Objective:  Vital signs in last 24 hours: Vitals:   10/29/16 1100 10/29/16 1752 10/29/16 2218 10/30/16 0645  BP:  121/64 115/77 (!) 144/81  Pulse:  91 100 75  Resp:  18 18 18   Temp:  98.1 F (36.7 C) 98.9 F (37.2 C) 98.8 F (37.1 C)  TempSrc:  Oral Oral Oral  SpO2:  94% 93% 95%  Weight:      Height: 5\' 10"  (1.778 m)      Physical Exam  Constitutional: He appears well-developed and well-nourished. No distress.  HENT:  Head: Normocephalic and atraumatic.  Cardiovascular: Normal rate, regular rhythm and normal heart sounds.   No murmur heard. Pulmonary/Chest: Effort normal and breath sounds normal.  Abdominal: He exhibits no distension. There is no tenderness.  Neurological: He is alert.   Assessment/Plan:  Active Problems:   Ataxia   Numbness   Spinal stenosis of lumbar region without neurogenic claudication  1. History of bilateral leg weakness, numbness: Significant lumbar spinal stenosis on MRI, chronic changes per neurosurgery and not a surgical candidate. They recommended continuing the patient's home PT regimen and IR-guided epidural steroid injection. Discharge pending PT eval of whether he qualifies for inpatient rehab.  - Neurosurgery spoke with patient again today regarding questions about procedure  - IR to speak with patient about epidural steroid injection procedure   2. Complicated UTI: U/A with +nitrites and +leuk esterase. This will be day 3 of cefazolin. Received 1 dose this AM. Will transition to keflex as outpatient.  - Transition cefazolin 1g q8hr to keflex 500 mg PO BID for outpatient  Urinary incontinence: Patient uses diapers at home, using condom cath here and will continue. Treating for UTI as above. Seizures: On lamotrigine ppx for  recurrent seizures per neurology. Continue home lamotrigine dose of 50 mg in AM and 100 mg in PM.  Peptic ulcer disease: Continue home pepcid 20 mg QD.  CV risk: Continue lipitor 40 mg and plavix 75 mg qd.  COPD: Continue home spiriva.  MDD: Continue home cymbalta.  Dispo: Anticipated discharge in approximately 1 day(s).   Lethea Killings, Medical Student 10/30/2016, 7:52 AM Pager: [phone]  Attestation for Student Documentation:  I personally was present and performed or re-performed the history, physical exam and medical decision-making activities of this service and have verified that the service and findings are accurately documented in the student's note.  [name], MD 11/03/2016, 9:59 AM

## 2016-10-30 NOTE — Evaluation (Signed)
Occupational Therapy Treatment Patient Details Name: Todd [name] MRN: [id number] DOB: Sep 28, 1945 Today's Date: 10/30/2016    History of present illness Pt is a 71 yo male admitted through ED on 10/28/16 when he was unable to transfer. 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 PMH significant for TBI s/p 1991, multiple falls, siezures.     OT comments  This 71 y/o M presents with the above. Prior to this admission, Pt was completing functional mobility using RW with MinA and was receiving assist from spouse for ADL completion. Pt with very supportive spouse who was present throughout session completion. Pt with cognitive deficits at baseline due to hx of TBI, however is able to follow 1 step commands given increased time and repetition in minimally distracting environment, verbalizes basic needs. Pt currently requires ModA +2 for functional mobility with RW and total assist for LB ADLs. Pt will benefit from continued acute OT services and will benefit from short term rehab stay prior to return home to maximize Pt's safety, independence with ADLs and functional mobility and to decrease burden of care for caregiver.    Follow Up Recommendations  CIR;Supervision/Assistance - 24 hour    Equipment Recommendations  None recommended by OT (Pt has necessary DME )          Precautions / Restrictions Precautions Precautions: Fall Restrictions Weight Bearing Restrictions: No       Mobility Bed Mobility Overal bed mobility: Needs Assistance Bed Mobility: Supine to Sit     Supine to sit: +2 for physical assistance;Mod assist     General bed mobility comments: Max-Mod A to achieve sitting EOB with cues for mobility  Transfers Overall transfer level: Needs assistance Equipment used: Rolling walker (2 wheeled) Transfers:  Sit to/from [employer] Sit to Stand: +2 physical assistance;Mod assist   Squat pivot transfers: Mod assist;+2 physical assistance     General transfer comment: Mod A +2 from EOB and BSC with cues for pushing up. Mod A with increased cues and sequencing to turn and sit in recliner    Balance Overall balance assessment: Needs assistance Sitting-balance support: No upper extremity supported;Feet supported Sitting balance-Leahy Scale: Fair     Standing balance support: Bilateral upper extremity supported Standing balance-Leahy Scale: Poor                             ADL either performed or assessed with clinical judgement   ADL Overall ADL's : Needs assistance/impaired Eating/Feeding: Minimal assistance;Sitting   Grooming: Minimal assistance;Sitting   Upper Body Bathing: Moderate assistance;Sitting   Lower Body Bathing: Maximal assistance;+2 for physical assistance;Sit to/from stand   Upper Body Dressing : Moderate assistance;Sitting Upper Body Dressing Details (indicate cue type and reason): doffing/donning hospital gown  Lower Body Dressing: Maximal assistance;+2 for physical assistance;+2 for safety/equipment;Sit to/from stand   Toilet Transfer: Maximal assistance;+2 for physical assistance;+2 for safety/equipment;Stand-pivot;BSC;RW   Toileting- Clothing Manipulation and Hygiene: Maximal assistance;+2 for physical assistance       Functional mobility during ADLs: Moderate assistance;+2 for physical assistance;+2 for safety/equipment;Rolling walker                     Cognition Arousal/Alertness: Awake/alert Behavior During Therapy: WFL for tasks assessed/performed Overall Cognitive Status: History of cognitive impairments - at baseline  General Comments: Pt with hx of TBI; follows one step directions with increased time and repetition in minimally distracting environment; able to express  basic needs (holding up number "2" with fingers to inidicate need to have BM)                     General Comments      Pertinent Vitals/ Pain       Pain Assessment: No/denies pain  Home Living Family/patient expects to be discharged to:: Skilled nursing facility Living Arrangements: Spouse/significant other                               Additional Comments: has walker, BSC, platform RW, lift chair, grab bars in home, completely set up ith all equipment at home.       Prior Functioning/Environment Level of Independence: Needs assistance  Gait / Transfers Assistance Needed: performs short distance gait with RW ADL's / Homemaking Assistance Needed: Pt requires max A with ADLs/selfcare. Can feed self, however required sup and cues to safely eat portions per RN discussion with pt's wife Communication / Swallowing Assistance Needed: Dysphagia diet, dysartria     Frequency  Min 2X/week        Progress Toward Goals  OT Goals(current goals can now be found in the care plan section)     Acute Rehab OT Goals Patient Stated Goal: to got o rehab and then home OT Goal Formulation: With patient/family Time For Goal Achievement: 11/13/16 Potential to Achieve Goals: Good ADL Goals Pt Will Perform Grooming: with supervision;sitting Pt Will Perform Upper Body Bathing: sitting;with min assist Pt Will Perform Upper Body Dressing: with min assist;sitting Pt Will Perform Lower Body Dressing: sit to/from stand;with max assist Pt Will Transfer to Toilet: with mod assist;stand pivot transfer;bedside commode Pt Will Perform Toileting - Clothing Manipulation and hygiene: with max assist;sit to/from stand  Plan      Co-evaluation    PT/OT/SLP Co-Evaluation/Treatment: Yes Reason for Co-Treatment: Complexity of the patient's impairments (multi-system involvement);For patient/therapist safety;To address functional/ADL transfers PT goals addressed during session:  Mobility/safety with mobility OT goals addressed during session: ADL's and self-care      AM-PAC PT "6 Clicks" Daily Activity     Outcome Measure   Help from another person eating meals?: A Little Help from another person taking care of personal grooming?: A Lot Help from another person toileting, which includes using toliet, bedpan, or urinal?: A Lot Help from another person bathing (including washing, rinsing, drying)?: A Lot Help from another person to put on and taking off regular upper body clothing?: A Lot Help from another person to put on and taking off regular lower body clothing?: A Lot 6 Click Score: 13    End of Session Equipment Utilized During Treatment: Gait belt;Rolling walker  OT Visit Diagnosis: Unsteadiness on feet (R26.81);Muscle weakness (generalized) (M62.81)   Activity Tolerance Patient tolerated treatment well   Patient Left in chair;with chair alarm set;with call bell/phone within reach;with family/visitor present;with nursing/sitter in room   Nurse Communication Mobility status        Time: 5038-8828 OT Time Calculation (min): 47 min  Charges: OT General Charges $OT Visit: 1 Procedure OT Evaluation $OT Eval Moderate Complexity: 1 Procedure  Lou Cal, OT Pager [phone] 10/30/2016    Raymondo Band 10/30/2016, 1:30 PM

## 2016-10-30 NOTE — Progress Notes (Signed)
Responded to [place] colleague referral and nurse second consult  My visit with patient was  to see if my observation was the same as [employer] assessment.  I visited with patient and person in room who indicated that she was not his legal wife but they  has been together for 30 years and has all power of attorney for his affairs. I agree with Chaplain Banks assesment.  After visiting with patient and talking with patient nurse, pt's Occupation therapist.I  All affrimed  that they would be uncomfortable proceeding with completing AD. All are not sure that patient has enough awareness of what he is doing.  Patient nurse agreed with me that it would be better to have this done outpatient. Patient friend very upset with staff, hospital and Chaplains and not accepting of staff decision. Chaplain available as needed for continued support as needed.   10/30/16 1509  Clinical Encounter Type  Visited With Patient and family together;Health care provider  Visit Type Follow-up;Spiritual support  Referral From Family;Chaplain;Nurse  Spiritual Encounters  Spiritual Needs Literature;Emotional  Stress Factors  Patient Stress Factors None identified  Family Stress Factors Family relationships;Health changes  Advance Directives (For Healthcare)  Does Patient Have a Medical Advance Directive? No  Cristopher Peru, [facility], Pager [phone]

## 2016-10-30 NOTE — Progress Notes (Signed)
Tried to contact ordering resident regarding order placed for IR.  It appears the patient is on plavix and could not have an ESI done currently.  It also appears he is being discharged after PT recommendations.  He can follow up as an outpatient for an ESI and to further discuss any other risks, benefits, or questions beyond what the primary service or nuerosurgery should have already discussed with the patient, at this visit.  His plavix would need to be held for 5 days.  Todd [name] E

## 2016-10-30 NOTE — Care Management (Signed)
ED CM received call from Dr Marlowe Sax IM regarding patient CIR admission status. CM reviewed record noted CIR assessment was done today but no mentioned of insurance authorization  noted. SW consult was placed regarding placement. CM spoke with patient and SO at bedside, SO requesting patient go to CIR CM explained that patient would have to be faxed out and would be given list but once a bed is available at a facility patient would need to accept if patient is medically cleared for transitioning to next level of care, patient and SO verbalized understanding teach back done.  CSW and CM will follow up for care transition planning in the am. Updated Dr. Marlowe Sax and Wells Guiles RN on [place].

## 2016-10-30 NOTE — Progress Notes (Signed)
   10/30/16 0900  Clinical Encounter Type  Visited With Patient and family together;Health care provider  Visit Type Initial;Spiritual support  Referral From Nurse  Consult/Referral To Chaplain  Stress Factors  Patient Stress Factors None identified  Family Stress Factors Health changes    Chaplain responded to [place] consult. Spoke with nurse and she indicated patient does not communicate and I don't feel comfortable making the call to notarize the HCPOA without communication that it is what the patient desires. Made attempts to communicate with patient and he doesn't respond to me directly. Wife communicates for him. Daysi Boggan L. Volanda Napoleon, MDiv

## 2016-10-31 MED ORDER — OXYCODONE HCL 5 MG PO TABS
2.5000 mg | ORAL_TABLET | Freq: Four times a day (QID) | ORAL | Status: DC | PRN
  Administered 2016-11-03 – 2016-11-05 (×3): 2.5 mg via ORAL
  Filled 2016-10-31 (×3): qty 1

## 2016-10-31 MED ORDER — SENNOSIDES-DOCUSATE SODIUM 8.6-50 MG PO TABS
2.0000 | ORAL_TABLET | Freq: Two times a day (BID) | ORAL | Status: DC
  Filled 2016-10-31: qty 2

## 2016-10-31 MED ORDER — DOCUSATE SODIUM 100 MG PO CAPS: 100.0000 mg | ORAL_CAPSULE | Freq: Every day | ORAL | Status: DC | PRN

## 2016-10-31 NOTE — Progress Notes (Signed)
   Subjective: NAEON. [name] wife spoke with neurosurgery yesterday and may be seen by IR today. She understands the plan and agrees.    Objective:  Vital signs in last 24 hours: Vitals:   10/30/16 1025 10/30/16 2201 10/31/16 0705 10/31/16 0959  BP: 118/78 131/88 126/73 125/69  Pulse: 88 (!) 123 92 91  Resp: 18 18 16 17   Temp: 98.3 F (36.8 C) (!) 97.4 F (36.3 C) 98.6 F (37 C) 97.9 F (36.6 C)  TempSrc: Oral Oral Oral Oral  SpO2: 96% 93% 95% 94%  Weight:      Height:       Physical Exam  Constitutional: He appears well-developed and well-nourished. No distress.  HENT:  Head: Normocephalic and atraumatic.  Cardiovascular: Normal rate, regular rhythm and normal heart sounds.   No murmur heard. Pulmonary/Chest: Effort normal and breath sounds normal. No respiratory distress.  Abdominal: Soft. Bowel sounds are normal.  Neurological: He is alert.   Assessment/Plan:  Active Problems:   Ataxia   Numbness   Spinal stenosis of lumbar region without neurogenic claudication  1. History of bilateral leg weakness, numbness: PT recommended inpatient rehab eval. Patient will stay over the weekend for that, and may stay until epidural steroid injection is performed by IR.  - IR to see patient's wife today to discuss risks vs. benefits of ESI - Continue to hold plavix  - To be evaluated by inpatient rehab   2. Complicated UTI: U/A with +nitrites +leuk esterease. As patient will stay in hospital, will continue with cefazolin.  - Continue cefazolin 1g q8hr (day 4 of 5)  Urinary incontinence: Patient uses diapers at home, using condom cath here and will continue. Treating for UTI as above. 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 Continuing home lamotrigine dose of 50 mg in AM and 100 mg in PM.  Peptic ulcer disease: Continuinghome pepcid 20 mg QD.  CV risk: Continuing lipitor 40 mg. Holding plavix for now as patient/ wife might decide to do epidural  steroid injection.  COPD: Continuinghome spiriva.  MDD: Continuing home cymbalta.  Diet: Heart healthy Code: FULL   Dispo: Anticipated discharge in approximately 3 day(s).   Lethea Killings, Medical Student 10/31/2016, 10:46 AM Pager: [phone]  Attestation for Student Documentation:  I personally was present and performed or re-performed the history, physical exam and medical decision-making activities of this service and have verified that the service and findings are accurately documented in the student's note.  [name], MD 10/31/2016, 12:56 PM

## 2016-10-31 NOTE — Progress Notes (Signed)
Internal Medicine Attending  Date: 10/31/2016  Patient name: Todd [name] Medical record number: [id number] Date of birth: 22-Feb-1946 Age: 71 y.o. Gender: male  I saw and evaluated the patient. I reviewed the resident/sub-intern's note by Dr. Amparo Bristol. [place] and I agree with the resident/sub-intern's findings and plans as documented in their progress note.

## 2016-10-31 NOTE — Progress Notes (Signed)
Wife stated pt is not as "perky" today as usual, concerned.  Paged internal medicine, stated they would come up at see patient.

## 2016-11-01 MED ORDER — CIPROFLOXACIN HCL 500 MG PO TABS
500.0000 mg | ORAL_TABLET | Freq: Two times a day (BID) | ORAL | Status: AC
  Administered 2016-11-01 – 2016-11-04 (×8): 500 mg via ORAL
  Filled 2016-11-01 (×9): qty 1

## 2016-11-01 NOTE — Progress Notes (Signed)
   Subjective: No acute events overnight. Patient reports having bilateral thigh pain. Reports having abdominal pain. Denies having any nausea or vomiting. He is able to tolerate by mouth intake. Per nursing staff's conversation with the patient's partner, he had 2 bowel movements yesterday.   Objective:  Vital signs in last 24 hours: Vitals:   10/31/16 1204 10/31/16 1859 10/31/16 2106 11/01/16 0500  BP: 122/86 123/87 117/67 126/76  Pulse: 93 92 92 78  Resp: 17 16 16 16   Temp:  98.6 F (37 C) 98.2 F (36.8 C) 98.7 F (37.1 C)  TempSrc:  Oral Oral Oral  SpO2: 96% 96% 96% 95%  Weight:   174 lb (78.9 kg)   Height:       Physical Exam  Constitutional: He appears well-developed and well-nourished. No distress.  HENT:  Head: Normocephalic and atraumatic.  Cardiovascular: Normal rate, regular rhythm and normal heart sounds.   No murmur heard. Pulmonary/Chest: Effort normal and breath sounds normal. No respiratory distress.  Abdominal: Bowel sounds are normal. There is no tenderness. There is guarding.  Mild generalized tenderness to palpation  Neurological: He is alert.   Assessment/Plan:  Active Problems:   Ataxia   Numbness   Spinal stenosis of lumbar region without neurogenic claudication  History of bilateral leg weakness, numbness: Inpatient rehabilitation is awaiting lumbar ESI. Plavix is being held currently. Patient complained of bilateral thigh pain today, likely related to his severe lumbar spinal canal stenosis. - IR to see patient's wife to discuss risks vs. benefits of ESI - Continue to hold plavix   Complicated UTI: U/A with +nitrites +leuk esterease. Per nursing staff, patient has lost his IV access.  -Discontinue cefazolin -Start ciprofloxacin 500 mg twice a day  Abdominal pain: Patient complained of abdominal pain. No nausea or vomiting; he is able to tolerate by mouth intake. Having regular bowel movements per nursing staff's conversation with his partner.  Mild generalized tenderness on palpation with guarding. Bowel sounds present. -Will continue to monitor   Urinary incontinence: Patient uses diapers at home, using condom cath here and will continue. Treating for UTI as above. 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 Continuing home lamotrigine dose of 50 mg in AM and 100 mg in PM.  Peptic ulcer disease: Continuinghome pepcid 20 mg QD.  CV risk: Continuing lipitor 40 mg. Holding plavix for now as patient/ wife might decide to do epidural steroid injection.  COPD: Continuinghome spiriva.  MDD: Continuing home cymbalta.  Diet: Heart healthy Code: FULL   Dispo: Anticipated discharge in approximately 2-3 day(s).   [name], MD 11/01/2016, 8:03 AM Pager: [phone]

## 2016-11-01 NOTE — Progress Notes (Signed)
Internal Medicine Attending  Date: 11/01/2016  Patient name: Todd [name] Medical record number: [id number] Date of birth: December 20, 1945 Age: 71 y.o. Gender: male  I saw and evaluated the patient. I reviewed the resident's note by Dr. Marlowe Sax and I agree with the resident's findings and plans as documented in her progress note.

## 2016-11-02 LAB — GLUCOSE, CAPILLARY: Glucose-Capillary: 124 mg/dL — ABNORMAL HIGH (ref 65–99)

## 2016-11-02 MED ORDER — POLYETHYLENE GLYCOL 3350 17 G PO PACK: 17.0000 g | PACK | Freq: Every day | ORAL | Status: DC | PRN

## 2016-11-02 NOTE — Progress Notes (Signed)
I met with pt and his wife at bedside to discuss a possible inpt rehab admit pending ESI and therapy progress afterwards. [employer] would have to approve any rehab venue. If pt unable to be admitted to CIR, pt has been multiple times to [facility] rehab. I will follow. [phone]

## 2016-11-02 NOTE — Progress Notes (Signed)
   Subjective: Patient has been feeling well this morning. He is more energetic and alert than before, per patient's partner likely due to medication changes. He has not complained of any further abdominal pain since yesterday, though he has not had a bowel movement in two days.   Objective:  Vital signs in last 24 hours: Vitals:   10/31/16 2106 11/01/16 0500 11/01/16 2216 11/02/16 0533  BP: 117/67 126/76 117/75 130/83  Pulse: 92 78 84 63  Resp: 16 16 15 16   Temp: 98.2 F (36.8 C) 98.7 F (37.1 C) 98.9 F (37.2 C) 98.6 F (37 C)  TempSrc: Oral Oral Oral Oral  SpO2: 96% 95% 96% 94%  Weight: 78.9 kg (174 lb)  78.9 kg (174 lb)   Height:       Physical Exam  Constitutional: He appears well-developed and well-nourished. No distress.  HENT:  Head: Normocephalic and atraumatic.  Eyes: No scleral icterus.  Cardiovascular: Normal rate and regular rhythm.   Pulmonary/Chest: Effort normal and breath sounds normal.  Abdominal: Bowel sounds are normal. He exhibits distension. There is no tenderness. There is no guarding.  Neurological: He is alert.   Assessment/Plan:  Active Problems:   Ataxia   Numbness   Spinal stenosis of lumbar region without neurogenic claudication  1. History of bilateral leg weakness, numbness: Awaiting IR to see patient and discuss risks/benefits and consent for ESI. Today is day 4 of holding plavix, earliest possibility for procedure is tomorrow. - F/u IR recs - Continue holding plavix   2. Complicated UTI: +Nitrites and +LE. No IV access. Previously completed 3 days of ancef, and 1 day of cipro. Will continue until 7 days of total treatment for complicated UTI.  - Continue ciprofloxacin 500 mg PO BID   3. Distended abdomen: Notably distended and somewhat hard abdomen today. No tenderness, rebound, guarding, normoactive BS. No BM x 2 days after d/c'ing colace, senna, miralax.  - Restart Miralax daily prn   Urinary incontinence: Patient uses diapers at  home, using condom cath here and will continue. Treating for UTI as above. 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 Continuing home lamotrigine dose of 50 mg in AM and 100 mg in PM.  Peptic ulcer disease: Continuinghome pepcid 20 mg QD.  CV risk: Continuing lipitor 40 mg. Holding plavix as above for ESI.  COPD: Continuinghome spiriva.  MDD: Continuing home cymbalta.  Dispo: Anticipated discharge in approximately 2 day(s).   Lethea Killings, Medical Student 11/02/2016, 9:54 AM Pager: [phone]  Attestation for Student Documentation:  I personally was present and performed or re-performed the history, physical exam and medical decision-making activities of this service and have verified that the service and findings are accurately documented in the student's note.  [name], MD 11/02/2016, 1:24 PM

## 2016-11-02 NOTE — Progress Notes (Signed)
Referring Physician(s): Dr Chiquita Loth  Supervising Physician: Marybelle Killings  Patient Status:  [facility] - In-pt  Chief Complaint:  Remote hx TBI Worsening back pain and B leg numbness x weeks  Subjective:  longhx back issues Has had back surgeries ---for years No recent surgery Only recent injury- pt fell over wheel chair 2 months ago Did not complain of pain or injury then Only recently has had numbness and tingling fromlow back to B legs Radiating to feet Pain is worsening now  MRI 7/4: IMPRESSION: 1. Severe spinal canal stenosis at L3-L4 and severe bilateral neural foraminal stenosis due to a combination of disc bulge, posterior vertebral wall retropulsion and moderate facet arthrosis. 2. Mild spinal canal stenosis at L2-L3 and L4-L5 with moderate bilateral L4-L5 neural foraminal stenosis. 3. Unchanged appearance of T12, L1, L2 and L3 compression fractures, as compared to 11/20/2014.  Neuro surgery has seen and evaluated pt Asking IR to perform L ESI Dr Barbie Banner has reviewed imaging and approves procedure Last dose Plavix 7/5   Allergies: Acetaminophen; Aleve [naproxen]; Beta adrenergic blockers; Codeine; Metoprolol; and Nsaids  Medications: Prior to Admission medications   Medication Sig Start Date End Date Taking? Authorizing Provider  albuterol (PROAIR HFA) 108 (90 BASE) MCG/ACT inhaler inhale 2 puffs INTO THE LUNGS every 6 hours if needed for wheezing Patient taking differently: Inhale 2 puffs into the lungs every 6 (six) hours as needed for wheezing or shortness of breath.  07/02/14  Yes Aldine Contes, MD  alendronate (FOSAMAX) 70 MG tablet Take 1 tablet (70 mg total) by mouth once a week. Take with a full glass of water on an empty stomach. Patient taking differently: Take 70 mg by mouth every Sunday. Take with a full glass of water on an empty stomach. 03/29/15  Yes Loleta Chance, MD  Ascorbic Acid (VITAMIN C) 1000 MG tablet Take 1,000 mg by mouth daily.   Yes  [provider]  aspirin 81 MG chewable tablet Chew 81 mg by mouth daily.   Yes [provider]  atorvastatin (LIPITOR) 80 MG tablet Take 1 tablet (80 mg total) by mouth daily at 6 PM. 10/25/14  Yes Rivet, Carly J, MD  b complex vitamins tablet Take 1 tablet by mouth daily.   Yes [provider]  Biotin 1000 MCG tablet Take 1,000 mcg by mouth daily.   Yes [provider]  chlorpheniramine-HYDROcodone (TUSSIONEX PENNKINETIC ER) 10-8 MG/5ML SUER Take 5 mLs by mouth every 12 (twelve) hours as needed for cough. 10/22/16  Yes Rice, Resa Miner, MD  Cholecalciferol (VITAMIN D) 2000 units tablet Take 2,000 Units by mouth daily.   Yes [provider]  clopidogrel (PLAVIX) 75 MG tablet take 1 tablet by mouth every morning 06/22/16  Yes Aldine Contes, MD  Co-Enzyme Q-10 100 MG CAPS Take 100 mg by mouth daily. 08/12/16  Yes [provider]  desonide (DESOWEN) 0.05 % cream Apply 1 application topically 2 (two) times daily. Pt applies to hair line.   Yes [provider]  famotidine (PEPCID) 20 MG tablet Take 20 mg by mouth daily.   Yes [provider]  fluticasone (FLONASE) 50 MCG/ACT nasal spray instill 1 spray into each nostril once daily 11/29/14  Yes Aldine Contes, MD  guaifenesin (HUMIBID E) 400 MG TABS tablet Take 400 mg by mouth 2 (two) times daily.    Yes [provider]  isosorbide mononitrate (IMDUR) 30 MG 24 hr tablet Take 1 tablet (30 mg total) by mouth daily.  10/25/14  Yes Rivet, Sindy Guadeloupe, MD  lamoTRIgine (LAMICTAL) 100 MG tablet Take 1/2 tablet ( 50 mg) by mouth in the AM. Take 1 tablet (100 mg) by mouth in the PM. Patient taking differently: Take 100 mg by mouth 2 (two) times daily. Take 1/2 tablet ( 50 mg) by mouth in the AM. Take 1 tablet (100 mg) by mouth in the PM. 08/05/16  Yes Cameron Sprang, MD  levocetirizine (XYZAL) 5 MG tablet Take 5 mg by mouth every evening.   Yes [provider]  lidocaine  (LIDODERM) 5 % Place 1 patch onto the skin daily. Pt applies to back.  Remove & Discard patch within 12 hours or as directed by MD   Yes [provider]  Multiple Vitamin (MULTIVITAMIN WITH MINERALS) TABS tablet Take 1 tablet by mouth daily.    Yes [provider]  oxyCODONE (OXY IR/ROXICODONE) 5 MG immediate release tablet Take 2.5-5 mg by mouth as needed for severe pain.    Yes [provider]  Tiotropium Bromide Monohydrate (SPIRIVA RESPIMAT) 2.5 MCG/ACT AERS Inhale 2 puffs into the lungs daily.   Yes [provider]  ketotifen ([employer] ANTIHISTAMINE EYE DROPS) 0.025 % ophthalmic solution Place 1 drop into both eyes 2 (two) times daily. Patient taking differently: Place 1 drop into both eyes as needed.  09/03/15   Liberty Handy, MD     Vital Signs: BP 136/75 (BP Location: Left Arm)   Pulse (!) 57   Temp 97.8 F (36.6 C) (Oral)   Resp 18   Ht 5\' 10"  (1.778 m)   Wt 174 lb (78.9 kg)   SpO2 95%   BMI 24.97 kg/m   Physical Exam  Cardiovascular: Normal rate.   Pulmonary/Chest: Effort normal.  Abdominal: Soft.  Musculoskeletal: Normal range of motion.  Neurological: He is alert.  Skin: Skin is warm and dry.  Psychiatric: He has a normal mood and affect.  Pt with some cognitive impairment Wife at bedside and consents for pt  Nursing note and vitals reviewed.   Imaging: No results found.  Labs:  CBC:  Recent Labs  07/30/16 2003 07/31/16 0525 08/01/16 0505 10/28/16 1515 10/28/16 1548 10/28/16 2037  WBC 12.4* 15.4* 11.8* 7.4  --   --   HGB 14.7 12.9* 12.1* 14.1 13.9  --   HCT 44.9 40.6 37.4* 43.8 41.0 43.9  PLT 248 222 217 208  --   --     COAGS:  Recent Labs  10/28/16 1515  INR 1.11  APTT 36    BMP:  Recent Labs  07/08/16 0529 07/30/16 2003 07/31/16 0525 10/28/16 1515 10/28/16 1548  NA 143 144 138 141 144  K 3.9 4.1 3.9 4.0 4.0  CL 109 108 105 109 107  CO2 26 26 23 25   --   GLUCOSE 94 111* 127* 116* 112*  BUN 19 14  14 17  21*  CALCIUM 9.0 9.3 8.5* 9.1  --   CREATININE 0.85 0.93 0.84 1.00 0.90  GFRNONAA >60 >60 >60 >60  --   GFRAA >60 >60 >60 >60  --     LIVER FUNCTION TESTS:  Recent Labs  10/28/16 1515  BILITOT 1.0  AST 47*  ALT 27  ALKPHOS 44  PROT 6.4*  ALBUMIN 3.5    Assessment and Plan:  Low back pain with radiation to B legs to feet Numbness occasionally to thighs Request form Dr Vertell Limber for Lumbar epidural steroid injection MRI reviewed and approved for procedure Radiologist that performs  these type of injections must be available to perform Scheduled for possible procedure in next 24-48 hrs Last dose Plavix 7/5 Pt and wife aware we will call for pt when Radiologist is available for procedure Risks and benefits have been discussed with pt and wife with their apparent understanding Consent signed and in chart  Electronically Signed: Jasai Sorg A, PA-C 11/02/2016, 2:11 PM   I spent a total of 25 Minutes at the the patient's bedside AND on the patient's hospital floor or unit, greater than 50% of which was counseling/coordinating care for L ESI

## 2016-11-02 NOTE — Progress Notes (Signed)
  Speech Language Pathology Treatment: Dysphagia  Patient Details Name: Todd [name] MRN: [id number] DOB: 10/28/1945 Today's Date: 11/02/2016 Time: 5102-5852 SLP Time Calculation (min) (ACUTE ONLY): 15 min  Assessment / Plan / Recommendation Clinical Impression  F/u after 7/6 MBS; wife present.  Pt with extensive hx of dysphagia of varying degrees over many years.  His wife is well-versed in deficits and accommodations to promote safer eating.  Friday's MBS demonstrated effectiveness in preventing aspiration with a chin tuck.  Today, pt required moderate cues to carry this out consistently.  Aspiration is not consistently accompanied by a cough response, so ensuring execution of chin tuck is particularly important.  Mrs. Letarte also mentions deterioration in pt's voice and speech recently; he would benefit from speech therapy to address communication.  Acute care SLP will continue to follow for safety/precaution training.    HPI HPI: Pt is a 71 year old male with a history of TBI in 69. He is admitted with increased bilateral leg weakness noted by wife; typically he is able to participate in transfers, but is now total assist. CT of head negative.  CXR clear. Pt found to ahve UTI.  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 Last MBS on 07/08/16 conducted due to acute worsening of dysphagia at SNF, pts diet downgraded from nectar to honey with reflexive coughing seen that eject penetrates. Pt has also been observed to struggle with cup sips due to impulsivity and was later recommended to consume liquids via spoon.       SLP Plan  Continue with current plan of care       Recommendations  Diet recommendations: Dysphagia 3 (mechanical soft);Honey-thick liquid Liquids provided via: Straw (with chin tuck) Medication Administration: Crushed with puree Supervision: Staff to assist with self feeding Compensations: Slow rate;Small sips/bites;Chin tuck Postural Changes and/or Swallow Maneuvers: Seated  upright 90 degrees                Oral Care Recommendations: Oral care BID Follow up Recommendations: Inpatient Rehab SLP Visit Diagnosis: Dysphagia, oropharyngeal phase (R13.12) Plan: Continue with current plan of care       GO                [name] 11/02/2016, 11:34 AM

## 2016-11-02 NOTE — Progress Notes (Signed)
Physical Therapy Treatment Patient Details Name: Todd [name] MRN: [id number] DOB: Jul 19, 1945 Today's Date: 11/02/2016    History of Present Illness Pt is a 71 yo male admitted through ED on 10/28/16 when he was unable to transfer. 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 PMH significant for TBI s/p 1991, multiple falls, siezures.      PT Comments    Continuing work on functional mobility and activity tolerance;  Able to make progress, increasing time spent in upright activity, and walking a little further; needs specific, simple cueing for steps and to attend to task; Pt and wife are motivated to help him get better; Continue to recommend comprehensive inpatient rehab (CIR) for post-acute therapy needs.   Follow Up Recommendations  CIR     Equipment Recommendations  None recommended by PT    Recommendations for Other Services Rehab consult     Precautions / Restrictions Precautions Precautions: Fall Restrictions Weight Bearing Restrictions: No    Mobility  Bed Mobility Overal bed mobility: Needs Assistance                Transfers Overall transfer level: Needs assistance Equipment used: Rolling walker (2 wheeled) Transfers: Sit to/from Stand Sit to Stand: +2 physical assistance;Mod assist         General transfer comment: Mod assist to power up; tends to initiate from a posterior lean, needing at least mod assist to keep center of mass forward, voer base of support  Ambulation/Gait Ambulation/Gait assistance: Mod assist;+2 physical assistance;+2 safety/equipment Ambulation Distance (Feet): 25 Feet Assistive device: Rolling walker (2 wheeled) Gait Pattern/deviations: Step-to pattern;Wide base of support Gait velocity: decreased Gait velocity interpretation: Below normal speed for  age/gender General Gait Details: pt requires increased assistance to sequencing and perform gait with RW. manual and vcs for proximity through RW; tactile cueing near greater trochanters to initiate stepping and encourage longer steps   Stairs            Wheelchair Mobility    Modified Rankin (Stroke Patients Only)       Balance     Sitting balance-Leahy Scale: Fair       Standing balance-Leahy Scale: Poor                              Cognition Arousal/Alertness: Awake/alert Behavior During Therapy: WFL for tasks assessed/performed Overall Cognitive Status: History of cognitive impairments - at baseline Area of Impairment: Attention;Following commands                   Current Attention Level: Focused   Following Commands: Follows one step commands with increased time       General Comments: Pt requires short concise directions, increased time, and multimodal cues to process and follow directions.       Exercises      General Comments        Pertinent Vitals/Pain Pain Assessment: Faces Faces Pain Scale: Hurts a little bit Pain Location: Back pain Pain Descriptors / Indicators: Discomfort Pain Intervention(s): Monitored during session    Home Living                      Prior Function            PT Goals (current goals  can now be found in the care plan section) Acute Rehab PT Goals Patient Stated Goal: to got o rehab and then home PT Goal Formulation: With family Time For Goal Achievement: 11/13/16 Potential to Achieve Goals: Good Progress towards PT goals: Progressing toward goals    Frequency    Min 2X/week      PT Plan Current plan remains appropriate    Co-evaluation PT/OT/SLP Co-Evaluation/Treatment: Yes Reason for Co-Treatment: Complexity of the patient's impairments (multi-system involvement);For patient/therapist safety PT goals addressed during session: Mobility/safety with mobility OT goals  addressed during session: ADL's and self-care      AM-PAC PT "6 Clicks" Daily Activity  Outcome Measure  Difficulty turning over in bed (including adjusting bedclothes, sheets and blankets)?: Total Difficulty moving from lying on back to sitting on the side of the bed? : Total Difficulty sitting down on and standing up from a chair with arms (e.g., wheelchair, bedside commode, etc,.)?: Total Help needed moving to and from a bed to chair (including a wheelchair)?: A Lot Help needed walking in hospital room?: A Lot Help needed climbing 3-5 steps with a railing? : A Lot 6 Click Score: 9    End of Session Equipment Utilized During Treatment: Gait belt Activity Tolerance: Patient tolerated treatment well Patient left: in chair;with call bell/phone within reach;with chair alarm set;with family/visitor present Nurse Communication: Mobility status PT Visit Diagnosis: Unsteadiness on feet (R26.81);Ataxic gait (R26.0);History of falling (Z91.81)     Time: 7948-0165 PT Time Calculation (min) (ACUTE ONLY): 40 min  Charges:  $Gait Training: 8-22 mins                    G Codes:       Roney Marion, PT  Acute Rehabilitation Services Pager [phone] Office [place] 11/02/2016, 4:21 PM

## 2016-11-02 NOTE — Consult Note (Signed)
   [facility] CM Inpatient Consult   11/02/2016  Todd [name] Jan 13, 1946 [id number]  Patient was evaluated for multiple hospitalizations in the past 6 months in the [place].  The patient is a 71 year old male with a HX of traumatic brain injury with a HX of falls.  Patient is currently being evaluated for inpatient rehab and awaiting more neurological follow up.  Will follow for progress and needs for rehab which would need insurance authorization, as well.   [name], RN BSN [facility] Liaison  [phone] business mobile phone Toll free office [phone]

## 2016-11-02 NOTE — Progress Notes (Signed)
Occupational Therapy Treatment Patient Details Name: Todd [name] MRN: [id number] DOB: 1945-05-14 Today's Date: 11/02/2016    History of present illness Pt is a 71 yo male admitted through ED on 10/28/16 when he was unable to transfer. 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 PMH significant for TBI s/p 1991, multiple falls, siezures.     OT comments  Pt progressing towards goals, demonstrating increased mobility this session, however continues to require ModA+2 for safety. Pt completed standing grooming ADLs with MinA+2, continues to require MaxA for seated LB ADLs. Pt requires increased time and multimodal cues for following one step directions. Pt is very pleasant and motivated to participate in therapy session. Pt will continue to benefit from continued acute OT services and will benefit from additional OT services in post acute setting prior to return home to maximize safety and independence with ADLs, functional mobility and to decrease caregiver burden. Will continue to follow.    Follow Up Recommendations  CIR;Supervision/Assistance - 24 hour    Equipment Recommendations  None recommended by OT          Precautions / Restrictions Precautions Precautions: Fall Restrictions Weight Bearing Restrictions: No       Mobility Bed Mobility Overal bed mobility: Needs Assistance             General bed mobility comments: OOB in recliner at start of session  Transfers Overall transfer level: Needs assistance Equipment used: Rolling walker (2 wheeled) Transfers: Sit to/from Stand Sit to Stand: +2 physical assistance;Mod assist   Squat pivot transfers: Mod assist;+2 physical assistance     General transfer comment: Mod assist to power up; tends to initiate from a posterior lean, needing at least mod  assist to keep [name] of mass forward, voer base of support    Balance Overall balance assessment: Needs assistance   Sitting balance-Leahy Scale: Fair     Standing balance support: Bilateral upper extremity supported Standing balance-Leahy Scale: Poor                             ADL either performed or assessed with clinical judgement   ADL Overall ADL's : Needs assistance/impaired     Grooming: Minimal assistance;Standing Grooming Details (indicate cue type and reason): Pt completed washing face and brushing hair standing at sink with MinA +2 to steady during task completion (Pt using single UE support during task)             Lower Body Dressing: Maximal assistance;+2 for physical assistance;+2 for safety/equipment Lower Body Dressing Details (indicate cue type and reason): doffing/donning new brief, assist to thread LEs through and pull over hips; Pt able to doff slide on shoes, needs assist to don Toilet Transfer: +2 for physical assistance;+2 for safety/equipment;Stand-pivot;BSC;RW;Moderate assistance   Toileting- Clothing Manipulation and Hygiene: +2 for physical assistance;Moderate assistance;+2 for safety/equipment Toileting - Clothing Manipulation Details (indicate cue type and reason): assist for gown management and peri hygiene      Functional mobility during ADLs: Moderate assistance;+2 for physical assistance;+2 for safety/equipment;Rolling walker General ADL Comments: Pt requires short concise directions and multimodal cues throughout to complete tasks and activity demands; +2 assist for safety during mobility with RW, attemped out of room mobility however [name], [name]                       Cognition Arousal/Alertness: Awake/alert Behavior During Therapy: Todd [name] for tasks assessed/performed Overall Cognitive Status: History of cognitive impairments - at  baseline Area of Impairment: Attention;Following commands                   Current Attention Level: Focused   Following Commands: Follows one step commands with increased time       General Comments: Pt requires short concise directions, increased time, and multimodal cues to process and follow directions.                     General Comments      Pertinent Vitals/ Pain       Pain Assessment: Faces Faces Pain Scale: Hurts a little bit Pain Location: Back pain Pain Descriptors / Indicators: Discomfort Pain Intervention(s): Monitored during session  Home Living                                                        Frequency  Min 2X/week        Progress Toward Goals  OT Goals(current goals can now be found in the care plan section)  Progress towards OT goals: Progressing toward goals  Acute Rehab OT Goals Patient Stated Goal: to got to rehab and then home OT Goal Formulation: With patient/family Time For Goal Achievement: 11/13/16 Potential to Achieve Goals: Good  Plan Discharge plan remains appropriate    Co-evaluation    PT/OT/SLP Co-Evaluation/Treatment: Yes Reason for Co-Treatment: Complexity of the patient's impairments (multi-system involvement);For patient/therapist safety PT goals addressed during session: Mobility/safety with mobility OT goals addressed during session: ADL's and self-care      AM-PAC PT "6 Clicks" Daily Activity     Outcome Measure   Help from another person eating meals?: A Little Help from another person taking care of personal grooming?: A Little Help from another person toileting, which includes using toliet, bedpan, or urinal?: A Lot Help from another person bathing (including washing, rinsing, drying)?: A Lot Help from another person to put on and taking off regular upper body clothing?: A Lot Help from another person to put on and taking off regular lower body clothing?: A Lot 6 Click  Score: 14    End of Session Equipment Utilized During Treatment: Gait belt;Rolling walker  OT Visit Diagnosis: Unsteadiness on feet (R26.81);Muscle weakness (generalized) (M62.81)   Activity Tolerance Patient tolerated treatment well   Patient Left in chair;with chair alarm set;with call bell/phone within reach;with family/visitor present   Nurse Communication          Time: 7494-4967 OT Time Calculation (min): 40 min  Charges: OT General Charges $OT Visit: 1 Procedure OT Treatments $Self Care/Home Management : 8-22 mins  Lou Cal, OT Pager [phone] 11/02/2016    Raymondo Band 11/02/2016, 4:38 PM

## 2016-11-02 NOTE — Progress Notes (Signed)
Internal Medicine Attending  Date: 11/02/2016  Patient name: Todd [name] Medical record number: [id number] Date of birth: [birth year] Age: 71 y.o. Gender: male  I saw and evaluated the patient. I reviewed the resident/sub-intern's note by Dr. Amparo Bristol. [place] and I agree with the resident/sub-intern's findings and plans as documented in their progress note.

## 2016-11-03 ENCOUNTER — Encounter: Payer: Self-pay | Admitting: Internal Medicine

## 2016-11-03 ENCOUNTER — Encounter: Payer: Medicare HMO | Admitting: Internal Medicine

## 2016-11-03 ENCOUNTER — Inpatient Hospital Stay (HOSPITAL_COMMUNITY): Payer: Medicare HMO

## 2016-11-03 MED ORDER — ENOXAPARIN SODIUM 40 MG/0.4ML ~~LOC~~ SOLN
40.0000 mg | SUBCUTANEOUS | Status: DC
  Administered 2016-11-04 – 2016-11-05 (×2): 40 mg via SUBCUTANEOUS
  Filled 2016-11-03 (×2): qty 0.4

## 2016-11-03 NOTE — Progress Notes (Signed)
   Subjective: Patient feels well this morning. He has no complaints. He had a BM and has not had further constipation.  Objective:  Vital signs in last 24 hours: Vitals:   11/02/16 1000 11/02/16 1800 11/02/16 2204 11/03/16 0622  BP: 136/75 132/74 119/75 127/66  Pulse: (!) 57 67 84 63  Resp: 18 18 18 18   Temp: 97.8 F (36.6 C) 98 F (36.7 C) 97.8 F (36.6 C) 97.9 F (36.6 C)  TempSrc: Oral Oral Oral Oral  SpO2: 95% 98% 95% 97%  Weight:   78.9 kg (174 lb)   Height:       Physical Exam  Constitutional: He appears well-developed and well-nourished. No distress.  HENT:  Head: Normocephalic and atraumatic.  Cardiovascular: Normal rate, regular rhythm and normal heart sounds.   No murmur heard. Pulmonary/Chest: Effort normal and breath sounds normal. No respiratory distress. He has no wheezes.  Abdominal: Soft. Bowel sounds are normal. He exhibits no distension. There is no tenderness.  Neurological: He is alert.    Assessment/Plan:  Active Problems:   Ataxia   Numbness   Spinal stenosis of lumbar region without neurogenic claudication  1. History of bilateral leg weakness, numbness: Will possibly undergo ESI today as today is day 5 of holding plavix. - F/u possible IR procedure  - Continue holding plavix   2. Complicated UTI: +Nitrites and +LE. Previously completed 3 days of ancef, now on cipro.  - Continue ciprofloxacin 500 mg PO BID (day 6/7)  3. Intermittent constipation: Improved abdominal exam, had a BM after miralax yesterday. - Miralax daily prn   Urinary incontinence: Patient uses diapers at home, using condom cath here and will continue. Treating for UTI as above. 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 Continuing home lamotrigine dose of 50 mg in AM and 100 mg in PM.  Peptic ulcer disease: Continuinghome pepcid 20 mg QD.  CV risk: Continuing lipitor 40 mg. Holding plavix as above for ESI.  COPD: Continuinghome spiriva.  MDD:  Continuing home cymbalta.  Dispo: Anticipated discharge in approximately 1 day(s).   Lethea Killings, Medical Student 11/03/2016, 7:00 AM Pager: [phone]  Attestation for Student Documentation:  I personally was present and performed or re-performed the history, physical exam and medical decision-making activities of this service and have verified that the service and findings are accurately documented in the student's note.  [name], MD 11/03/2016, 9:14 AM

## 2016-11-03 NOTE — Progress Notes (Signed)
Internal Medicine Attending  Date: 11/03/2016  Patient name: Todd [name] Medical record number: [id number] Date of birth: 06-02-1945 Age: 71 y.o. Gender: male  I saw and evaluated the patient. I reviewed the resident/sub-intern's note by Dr. Amparo Bristol. [place] and I agree with the resident/sub-intern's findings and plans as documented in their progress note.

## 2016-11-04 ENCOUNTER — Encounter (HOSPITAL_COMMUNITY): Payer: Self-pay | Admitting: Radiology

## 2016-11-04 ENCOUNTER — Inpatient Hospital Stay (HOSPITAL_COMMUNITY): Payer: Medicare HMO

## 2016-11-04 HISTORY — PX: IR EPIDUROGRAPHY: IMG2365

## 2016-11-04 LAB — CREATININE, SERUM: CREATININE: 1.01 mg/dL (ref 0.61–1.24)

## 2016-11-04 MED ORDER — METHYLPREDNISOLONE ACETATE 40 MG/ML IJ SUSP
INTRAMUSCULAR | Status: AC
  Administered 2016-11-04: 40 mg
  Filled 2016-11-04: qty 1

## 2016-11-04 MED ORDER — IOPAMIDOL (ISOVUE-M 200) INJECTION 41%
INTRAMUSCULAR | Status: AC
  Administered 2016-11-04: 2 mL
  Filled 2016-11-04: qty 10

## 2016-11-04 MED ORDER — LIDOCAINE HCL (PF) 1 % IJ SOLN
INTRAMUSCULAR | Status: AC
  Filled 2016-11-04: qty 10

## 2016-11-04 MED ORDER — METHYLPREDNISOLONE ACETATE 80 MG/ML IJ SUSP
INTRAMUSCULAR | Status: AC
  Administered 2016-11-04: 80 mg
  Filled 2016-11-04: qty 1

## 2016-11-04 MED ORDER — SODIUM CHLORIDE 0.9 % IJ SOLN
INTRAMUSCULAR | Status: AC
  Filled 2016-11-04: qty 10

## 2016-11-04 MED ORDER — LIDOCAINE HCL (PF) 1 % IJ SOLN
INTRAMUSCULAR | Status: DC | PRN
  Administered 2016-11-04: 5 mL

## 2016-11-04 NOTE — Progress Notes (Signed)
Occupational Therapy Treatment Patient Details Name: Todd [name] MRN: [id number] DOB: [birth year] Today's Date: 11/04/2016    History of present illness Pt is a 71 yo male admitted through ED on 10/28/16 when he was unable to transfer. 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 PMH significant for TBI s/p 1991, multiple falls, siezures.     OT comments  Pt's wife assisting with ADL upon OT's arrival. Pt continues to requires 2 person assist for sit to stand and mobility, primarily for safety. Pt able to stand for one activity at sink for grooming. Needing extensive assist for bathing, dressing and change of his brief. Pt demonstrating improvement in attention to task and direction following this visit.  Follow Up Recommendations  CIR;Supervision/Assistance - 24 hour    Equipment Recommendations  None recommended by OT    Recommendations for Other Services      Precautions / Restrictions Precautions Precautions: Fall       Mobility Bed Mobility               General bed mobility comments: pt seated at EOB upon arrival, wife assisting with bathing  Transfers Overall transfer level: Needs assistance Equipment used: Rolling walker (2 wheeled) Transfers: Sit to/from [facility] Sit to Stand: +2 safety/equipment;Mod assist;Min assist Stand pivot transfers: Mod assist;+2 physical assistance       General transfer comment: Mod assist to power up; tends to initiate from a posterior lean, needing at least mod assist to keep center of mass forward, over base of support; performed several reps of sit<>stand during session; level of assist varied min/mod    Balance Overall balance assessment: Needs assistance   Sitting balance-Leahy Scale: Fair     Standing balance support: Bilateral upper  extremity supported Standing balance-Leahy Scale: Poor                             ADL either performed or assessed with clinical judgement   ADL Overall ADL's : Needs assistance/impaired     Grooming: Minimal assistance;Standing;Brushing hair       Lower Body Bathing: Total assistance;Sit to/from stand   Upper Body Dressing : Minimal assistance;Sitting   Lower Body Dressing: Total assistance;Sit to/from stand Lower Body Dressing Details (indicate cue type and reason): for brief Toilet Transfer: Moderate assistance;Minimal assistance;RW   Toileting- Clothing Manipulation and Hygiene: +2 for physical assistance;Total assistance;Sit to/from stand         General ADL Comments: Pt with bowel incontinence without awareness.     Vision       Perception     Praxis      Cognition Arousal/Alertness: Awake/alert Behavior During Therapy: WFL for tasks assessed/performed Overall Cognitive Status: History of cognitive impairments - at baseline                                 General Comments: Pt requires short concise directions, increased time, and multimodal cues to process and follow directions.         Exercises     Shoulder Instructions       General Comments      Pertinent Vitals/ Pain       Pain Assessment: Faces Faces Pain Scale: Hurts a little  bit Pain Location: Back pain; held up his fingers to indicate a "little bit" of back pain Pain Descriptors / Indicators: Discomfort Pain Intervention(s): Monitored during session;Repositioned  Home Living                                          Prior Functioning/Environment              Frequency  Min 2X/week        Progress Toward Goals  OT Goals(current goals can now be found in the care plan section)  Progress towards OT goals: Progressing toward goals  Acute Rehab OT Goals Patient Stated Goal: to got to rehab and then home OT Goal Formulation: With  patient/family Time For Goal Achievement: 11/13/16 Potential to Achieve Goals: Good  Plan Discharge plan remains appropriate    Co-evaluation    PT/OT/SLP Co-Evaluation/Treatment: Yes Reason for Co-Treatment: Complexity of the patient's impairments (multi-system involvement) PT goals addressed during session: Mobility/safety with mobility OT goals addressed during session: ADL's and self-care      AM-PAC PT "6 Clicks" Daily Activity     Outcome Measure   Help from another person eating meals?: A Little Help from another person taking care of personal grooming?: A Little Help from another person toileting, which includes using toliet, bedpan, or urinal?: Total Help from another person bathing (including washing, rinsing, drying)?: Total Help from another person to put on and taking off regular upper body clothing?: A Little Help from another person to put on and taking off regular lower body clothing?: Total 6 Click Score: 12    End of Session Equipment Utilized During Treatment: Gait belt;Rolling walker  OT Visit Diagnosis: Unsteadiness on feet (R26.81);Muscle weakness (generalized) (M62.81)   Activity Tolerance Patient tolerated treatment well   Patient Left in chair;with call bell/phone within reach;with family/visitor present   Nurse Communication          Time: 1019-1100 OT Time Calculation (min): 41 min  Charges: OT General Charges $OT Visit: 1 Procedure OT Treatments $Self Care/Home Management : 8-22 mins    Malka So 11/04/2016, 1:36 PM  [phone]

## 2016-11-04 NOTE — NC FL2 (Signed)
[place] LEVEL OF CARE SCREENING TOOL     IDENTIFICATION  Patient Name: Todd [name] Birthdate: 1946/02/20 Sex: male Admission Date (Current Location): 10/28/2016  [facility] and [place] Number:  Herbalist and Address:  [facility] [place], [place] [address] [place], [place] [address]      Provider Number: [id number]  Attending Physician Name and Address:  Oval Linsey, MD  Relative Name and Phone Number:       Current Level of Care: Hospital Recommended Level of Care: [place] Prior Approval Number:    Date Approved/Denied:   PASRR Number: [id number]  Discharge Plan: SNF    Current Diagnoses: Patient Active Problem List   Diagnosis Date Noted  . Spinal stenosis of lumbar region without neurogenic claudication   . Ataxia 10/28/2016  . Numbness 10/28/2016  . Midline low back pain with sciatica 10/26/2016  . Numbness and tingling of both legs 10/26/2016  . Cough 10/22/2016  . Numbness and tingling 10/22/2016  . History of aspiration pneumonia 07/30/2016  . Gross hematuria 06/11/2016  . Sebaceous cyst 04/14/2016  . Allergic rhinitis 08/21/2015  . 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 Herpes zoster 03/29/2015  . Seizures ([place]) 01/16/2015  . Awareness alteration, transient 01/16/2015  . TBI (traumatic brain injury) ([place]) 01/16/2015  . Compression fracture of L2 lumbar vertebra ([place]) 11/20/2014  . Compression fracture of L4 lumbar vertebra ([place]) 11/20/2014  . Insomnia 11/02/2014  . CAD in native artery 10/24/2014  . Coronary artery calcification seen on CAT scan 10/24/2014  . Deviated septum 08/01/2014  . Dysphagia 05/18/2013  . Chronic pulmonary aspiration 05/16/2013  . Abnormality of gait 05/01/2013  . Unspecified constipation 12/07/2012  . COPD (chronic obstructive pulmonary  disease) ([place]) 08/17/2012  . Coronary atherosclerosis of native coronary artery 08/17/2012  . Generalized weakness 04/09/2012  . Osteoporosis with fracture 03/05/2011  . Preventive measure 01/26/2011  . Hyperlipidemia 04/07/2010  . Major depressive disorder, single episode, severe ([place]) 11/13/2009  . Personal history of traumatic brain injury 06/17/2009  . LOSS, HEARING NOS 11/26/2006  . Aphasia 11/26/2006  . HEPATITIS C 08/10/2006  . PEPTIC ULCER DISEASE 08/10/2006  . Urinary incontinence 08/10/2006    Orientation RESPIRATION BLADDER Height & Weight     Self, Place  Normal Incontinent, External catheter Weight: 174 lb (78.9 kg) Height:  5\' 10"  (177.8 cm)  BEHAVIORAL SYMPTOMS/MOOD NEUROLOGICAL BOWEL NUTRITION STATUS      Continent Diet (see DC summary)  AMBULATORY STATUS COMMUNICATION OF NEEDS Skin   Extensive Assist Verbally Normal                       Personal Care Assistance Level of Assistance  Bathing, Dressing Bathing Assistance: Maximum assistance   Dressing Assistance: Maximum assistance     Functional Limitations Info             SPECIAL CARE FACTORS FREQUENCY  PT (By licensed PT), OT (By licensed OT)     PT Frequency: 5/wk OT Frequency: 5/wk            Contractures      Additional Factors Info  Code Status, Allergies Code Status Info: FULL Allergies Info: Acetaminophen, Aleve Naproxen, Beta Adrenergic Blockers, Codeine, Metoprolol, Nsaids           Current Medications (11/04/2016):  This is the current hospital active medication list Current Facility-Administered Medications  Medication Dose Route Frequency Provider Last Rate Last Dose  . albuterol (PROVENTIL) (2.5 MG/3ML) 0.083% nebulizer solution 3 mL  3 mL Inhalation Q6H PRN [name], MD      . aspirin chewable tablet 81 mg  81 mg Oral Daily [name], MD   81 mg at 11/04/16 1103  . atorvastatin (LIPITOR) tablet 80 mg  80 mg Oral q1800 [name], MD   80  mg at 11/03/16 1854  . B-complex with vitamin C tablet 1 tablet  1 tablet Oral Daily [name], MD   1 tablet at 11/04/16 1102  . cholecalciferol (VITAMIN D) tablet 2,000 Units  2,000 Units Oral Daily [name], MD   2,000 Units at 11/04/16 1103  . ciprofloxacin (CIPRO) tablet 500 mg  500 mg Oral BID [name], MD   500 mg at 11/04/16 1101  . docusate sodium (COLACE) capsule 100 mg  100 mg Oral Daily PRN Helberg, Larkin Ina, MD      . DULoxetine (CYMBALTA) DR capsule 30 mg  30 mg Oral Daily [name], DO   30 mg at 11/04/16 1103  . enoxaparin (LOVENOX) injection 40 mg  40 mg Subcutaneous Q24H Turpin, Pamela, PA-C      . famotidine (PEPCID) tablet 20 mg  20 mg Oral Daily [name], MD   20 mg at 11/04/16 1102  . fluticasone (FLONASE) 50 MCG/ACT nasal spray 1 spray  1 spray Each Nare Daily [name], MD   1 spray at 11/04/16 1103  . guaiFENesin tablet 400 mg  400 mg Oral BID [name], MD   400 mg at 11/04/16 1102  . hydrocortisone cream 1 %   Topical BID [name], MD      . isosorbide mononitrate (IMDUR) 24 hr tablet 30 mg  30 mg Oral Daily [name], MD   30 mg at 11/04/16 1103  . lamoTRIgine (LAMICTAL) tablet 100 mg  100 mg Oral QHS [name], MD   100 mg at 11/03/16 2221  . lamoTRIgine (LAMICTAL) tablet 50 mg  50 mg Oral Daily Oval Linsey, MD   50 mg at 11/04/16 1101  . lidocaine (LIDODERM) 5 % 1 patch  1 patch Transdermal Q24H [name], MD   1 patch at 11/04/16 1102  . loratadine (CLARITIN) tablet 5 mg  5 mg Oral QPM [name], MD   5 mg at 11/03/16 1854  . MEDLINE mouth rinse  15 mL Mouth Rinse BID Oval Linsey, MD   15 mL at 11/03/16 2222  . multivitamin with minerals tablet 1 tablet  1 tablet Oral Daily [name], MD   1 tablet at 11/04/16 1103  . oxyCODONE (Oxy IR/ROXICODONE) immediate release tablet 2.5 mg  2.5 mg Oral Q6H PRN [name], MD   2.5 mg at 11/03/16  1516  . polyethylene glycol (MIRALAX / GLYCOLAX) packet 17 g  17 g Oral Daily PRN [name], MD      . tiotropium (SPIRIVA) inhalation capsule 18 mcg  18 mcg Inhalation Daily [name], MD   18 mcg at 11/02/16 1114  . vitamin C (ASCORBIC ACID) tablet 1,000 mg  1,000 mg Oral Daily [name], MD   1,000 mg at 11/04/16 1104     Discharge Medications: Please see discharge summary for a list of discharge medications.  Relevant Imaging Results:  Relevant Lab Results:   Additional Information SS#: [id number]  Jorge Ny, LCSW

## 2016-11-04 NOTE — Progress Notes (Signed)
   Subjective: No acute events overnight. Patient feels well this morning. Had some back pain last night that was well controlled with 2.5 mg oxy per patient's partner. No further GI complaints.  Objective:  Vital signs in last 24 hours: Vitals:   11/03/16 1000 11/03/16 1756 11/03/16 2220 11/04/16 0639  BP: (!) 113/59 100/62 124/82 133/75  Pulse: 67 92 81 63  Resp: 18 18 18 18   Temp: 98.2 F (36.8 C) 98 F (36.7 C) (!) 97.5 F (36.4 C) 97.6 F (36.4 C)  TempSrc: Oral Oral Oral Oral  SpO2: 98% 98% 95% 97%  Weight:      Height:       Physical Exam  Constitutional: He appears well-developed and well-nourished. No distress.  HENT:  Head: Normocephalic and atraumatic.  Cardiovascular: Normal rate, regular rhythm and normal heart sounds.   No murmur heard. Pulmonary/Chest: Effort normal and breath sounds normal. No respiratory distress.  Abdominal: Soft. Bowel sounds are normal.  Neurological: He is alert.   Assessment/Plan:  Active Problems:   Ataxia   Numbness   Spinal stenosis of lumbar region without neurogenic claudication  1. History of bilateral leg weakness, numbness:Will possibly undergo ESI today. Have held plavix for 6 days. - F/u possible IR procedure  - Continue holding plavix  - Continue PT and OT.  Appreciate their assistance. - PT/OT recs CIR.  Will continue to follow further recommendations of PT/OT s/p ESI and possible inpatient rehab vs CIR  2. Complicated UTI: +Nitrites and +LE. Previously completed 3 days of ancef, now on cipro.  - Last day of ciprofloxacin 500 mg PO BID (day 7/7).  Stop date added to orders.  3. Intermittent constipation: Soft and nondistended abdomen on exam today. Per patient's partner no further complaints. - Miralax daily PRN  Urinary incontinence: Patient uses diapers at home, using condom cath here and will continue. Treating for UTI as above. 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 Continuing  home lamotrigine dose of 50 mg in AM and 100 mg in PM.  Peptic ulcer disease: Continuinghome pepcid 20 mg QD.  CV risk: Continuing lipitor 40 mg. Holding plavix as above for ESI.  COPD: Continuinghome spiriva.  MDD: Continuing home cymbalta.  Dispo: Anticipated discharge in approximately 1 day(s).   Lethea Killings, Medical Student 11/04/2016, 6:54 AM Pager: [phone]  Attestation for Student Documentation:  I personally was present and performed or re-performed the history, physical exam and medical decision-making activities of this service and have verified that the service and findings are accurately documented in the student's note.  [name], DO 11/04/2016, 2:19 PM

## 2016-11-04 NOTE — Progress Notes (Signed)
I await PT and OT follow up after ESI to begin insurance authorization for a possible inpt rehab admit pending insurance approval. I will follow up today. [phone]

## 2016-11-04 NOTE — Progress Notes (Signed)
Physical Therapy Treatment Patient Details Name: Todd [name] MRN: [id number] DOB: 04-20-1946 Today's Date: 11/04/2016    History of Present Illness Pt is a 71 yo male admitted through ED on 10/28/16 when he was unable to transfer. 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 PMH significant for TBI s/p 1991, multiple falls, siezures.      PT Comments    Continuing work on functional mobility and activity tolerance;  Still with slow, short steps, and requiring multimodal cues (as well as an environment with limited distractions) to work on gait training; Improved smoothness of steps compared to last session, and more time spent performing upright activities; Noted plan for epidural spinal injection tomorrow  Follow Up Recommendations  CIR     Equipment Recommendations  None recommended by PT    Recommendations for Other Services       Precautions / Restrictions Precautions Precautions: Fall    Mobility  Bed Mobility               General bed mobility comments: standing with OT upon PT's arrival  Transfers Overall transfer level: Needs assistance Equipment used: Rolling walker (2 wheeled) Transfers: Sit to/from Stand Sit to Stand: +2 safety/equipment;Mod assist;Min assist         General transfer comment: Mod assist to power up; tends to initiate from a posterior lean, needing at least mod assist to keep center of mass forward, over base of support; performed several reps of sit<>stand during session; level of assist varied min/mod  Ambulation/Gait Ambulation/Gait assistance: Mod assist;+2 physical assistance;+2 safety/equipment Ambulation Distance (Feet): 30 Feet Assistive device: Rolling walker (2 wheeled) Gait Pattern/deviations: Decreased step length - right;Decreased step length - left Gait  velocity: decreased   General Gait Details: Multimodal cues to incr step length; tactile cueing to encourage full weigth acceptance on stance leg to unweigh advancing LE for swing; requiring this for both sides, though RLE with more difficulty and decr step length compared to L; Easily distracted; will occasionally freeze (two times during walk)   Stairs            Wheelchair Mobility    Modified Rankin (Stroke Patients Only)       Balance     Sitting balance-Leahy Scale: Fair       Standing balance-Leahy Scale: Poor                              Cognition Arousal/Alertness: Awake/alert Behavior During Therapy: WFL for tasks assessed/performed Overall Cognitive Status: History of cognitive impairments - at baseline                                 General Comments: Pt requires short concise directions, increased time, and multimodal cues to process and follow directions.       Exercises      General Comments        Pertinent Vitals/Pain Pain Assessment: Faces Faces Pain Scale: Hurts a little bit Pain Location: Back pain; held up his fingers to indicate a "little bit" of back pain Pain Descriptors / Indicators: Discomfort Pain Intervention(s): Monitored during session;Repositioned    Home Living  Prior Function            PT Goals (current goals can now be found in the care plan section) Acute Rehab PT Goals Patient Stated Goal: to got to rehab and then home PT Goal Formulation: With family Time For Goal Achievement: 11/13/16 Potential to Achieve Goals: Good Progress towards PT goals: Progressing toward goals    Frequency    Min 2X/week      PT Plan Current plan remains appropriate    Co-evaluation PT/OT/SLP Co-Evaluation/Treatment: Yes Reason for Co-Treatment: Complexity of the patient's impairments (multi-system involvement);Necessary to address cognition/behavior during functional  activity;For patient/therapist safety PT goals addressed during session: Mobility/safety with mobility        AM-PAC PT "6 Clicks" Daily Activity  Outcome Measure  Difficulty turning over in bed (including adjusting bedclothes, sheets and blankets)?: A Lot Difficulty moving from lying on back to sitting on the side of the bed? : Total Difficulty sitting down on and standing up from a chair with arms (e.g., wheelchair, bedside commode, etc,.)?: Total Help needed moving to and from a bed to chair (including a wheelchair)?: A Lot Help needed walking in hospital room?: A Lot Help needed climbing 3-5 steps with a railing? : A Lot 6 Click Score: 10    End of Session Equipment Utilized During Treatment: Gait belt Activity Tolerance: Patient tolerated treatment well Patient left: in chair;with call bell/phone within reach;with family/visitor present Nurse Communication: Mobility status PT Visit Diagnosis: Unsteadiness on feet (R26.81);Ataxic gait (R26.0);History of falling (Z91.81)     Time: 1761-6073 PT Time Calculation (min) (ACUTE ONLY): 42 min  Charges:  $Gait Training: 8-22 mins                    G Codes:       Roney Marion, PT  Acute Rehabilitation Services Pager [phone] Office [phone]    Colletta [place] 11/04/2016, 12:13 PM

## 2016-11-04 NOTE — Progress Notes (Signed)
Internal Medicine Attending  Date: 11/04/2016  Patient name: Todd [name] Medical record number: [id number] Date of birth: 1946-04-10 Age: 71 y.o. Gender: male  I saw and evaluated the patient. I reviewed the resident/sub-intern's note by Dr. Purcell Nails. [place] and I agree with the resident/sub-intern's findings and plans as documented in their progress note.  When seen on rounds this morning he was without complaints. He was much more alert than upon admission. He underwent a successful right L3-L4 epidural this afternoon. We will assess his symptomatic response in the morning and are waiting the final decision by CIR. If CIR is unable to accept the patient he will be discharged to [facility] for further physical therapy.

## 2016-11-05 DIAGNOSIS — M545 Low back pain: Secondary | ICD-10-CM

## 2016-11-05 DIAGNOSIS — M48062 Spinal stenosis, lumbar region with neurogenic claudication: Principal | ICD-10-CM

## 2016-11-05 DIAGNOSIS — S069X0S Unspecified intracranial injury without loss of consciousness, sequela: Secondary | ICD-10-CM

## 2016-11-05 LAB — CBC
HCT: 42.1 % (ref 39.0–52.0)
Hemoglobin: 13.4 g/dL (ref 13.0–17.0)
MCH: 32.3 pg (ref 26.0–34.0)
MCHC: 31.8 g/dL (ref 30.0–36.0)
MCV: 101.4 fL — ABNORMAL HIGH (ref 78.0–100.0)
PLATELETS: 208 10*3/uL (ref 150–400)
RBC: 4.15 MIL/uL — ABNORMAL LOW (ref 4.22–5.81)
RDW: 13.6 % (ref 11.5–15.5)
WBC: 8.8 10*3/uL (ref 4.0–10.5)

## 2016-11-05 LAB — BASIC METABOLIC PANEL
ANION GAP: 7 (ref 5–15)
BUN: 21 mg/dL — ABNORMAL HIGH (ref 6–20)
CALCIUM: 8.8 mg/dL — AB (ref 8.9–10.3)
CO2: 24 mmol/L (ref 22–32)
CREATININE: 0.99 mg/dL (ref 0.61–1.24)
Chloride: 108 mmol/L (ref 101–111)
Glucose, Bld: 103 mg/dL — ABNORMAL HIGH (ref 65–99)
Potassium: 4.2 mmol/L (ref 3.5–5.1)
Sodium: 139 mmol/L (ref 135–145)

## 2016-11-05 MED ORDER — CLOPIDOGREL BISULFATE 75 MG PO TABS: 75.0000 mg | ORAL_TABLET | Freq: Every morning | ORAL | Status: DC

## 2016-11-05 MED ORDER — CLOPIDOGREL BISULFATE 75 MG PO TABS
75.0000 mg | ORAL_TABLET | Freq: Every day | ORAL | Status: DC
  Administered 2016-11-05 – 2016-11-06 (×2): 75 mg via ORAL
  Filled 2016-11-05 (×2): qty 1

## 2016-11-05 NOTE — Progress Notes (Signed)
   Subjective: No acute events overnight. Patient feels well this morning and that leg pain is much improved after ESI. Has not felt febrile and denies abdominal complaints.  Objective:  Vital signs in last 24 hours: Vitals:   11/04/16 2035 11/04/16 2258 11/05/16 0450 11/05/16 0800  BP: 115/81  125/79 123/83  Pulse: 93  73 66  Resp: 18  18 16   Temp: (!) 97.4 F (36.3 C)  98.8 F (37.1 C) 98.2 F (36.8 C)  TempSrc: Oral  Oral Oral  SpO2: 90%  94% 95%  Weight:  79.4 kg (175 lb)    Height:       Physical Exam  Constitutional: He appears well-developed and well-nourished. No distress.  HENT:  Head: Normocephalic and atraumatic.  Cardiovascular: Normal rate, regular rhythm and normal heart sounds.   No murmur heard. Pulmonary/Chest: Effort normal and breath sounds normal. No respiratory distress.  Abdominal: Soft. Bowel sounds are normal. He exhibits no distension. There is no tenderness. There is no guarding.  Neurological: He is alert.  Clonus with LLE patellar reflex. 5/5 strength bilateral LEs.   Assessment/Plan:  Active Problems:   Ataxia   Numbness   Spinal stenosis of lumbar region without neurogenic claudication  1. History of bilateral leg weakness, numbness:Underwent ESI yesterday without complication. Waiting for final PT/ OT assessment; his qualification for CIR depends on this assessment. Will follow-up today. - Restart plavix today - Continue PT and OT, appreciate their assistance - PT/OT recs CIR, will follow-up final recommendations as above   2. Intermittent constipation:Continues to have a soft and nondistended abdomen on exam today. - Miralax daily PRN  3. Complicated UTI, resolved: Treated with ancef transitioned to cipro, for 7 days, finished last dose yesterday.   Urinary incontinence: Patient uses diapers at home, using condom cath here and will continue. Treating for UTI as above. 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  Continuing home lamotrigine dose of 50 mg in AM and 100 mg in PM.  Peptic ulcer disease: Continuinghome pepcid 20 mg QD.  CV risk: Continuing lipitor 40 mg. Holding plavix as above for ESI.  COPD: Continuinghome spiriva.  MDD: Continuing home cymbalta.  Dispo: Anticipated discharge today.   Lethea Killings, Medical Student 11/05/2016, 9:44 AM Pager: [phone]  Attestation for Student Documentation:  I personally was present and performed or re-performed the history, physical exam and medical decision-making activities of this service and have verified that the service and findings are accurately documented in the student's note.  [name], MD 11/05/2016, 11:01 AM

## 2016-11-05 NOTE — Progress Notes (Signed)
I await therapy follow up today post ESI yesterday to determine rehab venue options. [phone]
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
## 2016-11-05 NOTE — Care Management Important Message (Signed)
Important Message  Patient Details  Name: Todd [name] MRN: [id number] Date of Birth: February 04, 1946   Medicare Important Message Given:  Other (see comment) Medicare Outpatient Observation notice given and sign by patient    Orbie Pyo 11/05/2016, 10:01 AM

## 2016-11-05 NOTE — Clinical Social Work Note (Signed)
Clinical Social Work Assessment  Patient Details  Name: Todd [name] MRN: [id number] Date of Birth: 09-17-1945  Date of referral:  11/05/16               Reason for consult:  Facility Placement, Discharge Planning                Permission sought to share information with:  Facility Sport and exercise psychologist, Family Supports Permission granted to share information::  Yes, Verbal Permission Granted  Name::     [name]::  [facility]  Relationship::  Wife  Contact Information:     Housing/Transportation Living arrangements for the past 2 months:  Apartment Source of Information:  Spouse Patient Interpreter Needed:  None Criminal Activity/Legal Involvement Pertinent to Current Situation/Hospitalization:  No - Comment as needed Significant Relationships:  Spouse Lives with:  Self, Spouse Do you feel safe going back to the place where you live?  Yes Need for family participation in patient care:  Yes (Comment)  Care giving concerns:  Patient has been residing at home with spouse, but needs assistance at this time during a short rehab stay prior to returning home.   Social Worker assessment / plan:  CSW introduced self to patient's wife, [name], over the phone, and discussed discharge plan. CSW confirmed plan for CIR and discussed back-up plan for [facility], if needed. CSW explained role in coordinating discharge to SNF. CSW confirmed with [facility] that the facility can accept the patient, and submitted insurance authorization through [facility] in order to coordinate discharge to SNF, if needed.  Employment status:  Retired Nurse, adult PT Recommendations:  Inpatient [place] / Referral to community resources:     Patient/Family's Response to care:  Patient and patient's wife agreeable to SNF placement, if needed.  Patient/Family's Understanding of and Emotional Response to Diagnosis, Current Treatment, and Prognosis:   [name] wife indicated understanding of patient's current need for rehab, and is hopeful that patient will be able to transition to inpatient rehab. [name] wife indicated understanding of CSW role in discharge planning.  Emotional Assessment Appearance:  Appears stated age Attitude/Demeanor/Rapport:  Unable to Assess Affect (typically observed):  Unable to Assess Orientation:  Oriented to Self, Oriented to Place, Oriented to Situation Alcohol / Substance use:  Not Applicable Psych involvement (Current and /or in the community):  No (Comment)  Discharge Needs  Concerns to be addressed:  Care Coordination, Discharge Planning Concerns Readmission within the last 30 days:  No Current discharge risk:  Physical Impairment Barriers to Discharge:  Continued Medical Work up, [place], [place] 11/05/2016, 4:40 PM
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
## 2016-11-05 NOTE — Discharge Summary (Signed)
Name: Todd [name] MRN: [id number] DOB: 1945/10/03 71 y.o. PCP: Aldine Contes, MD  Date of Admission: 10/28/2016  1:18 PM Date of Discharge: 11/06/2016 Attending Physician: Oval Linsey, MD  Discharge Diagnosis: 1. Chronic Lumbar Spinal Stenosis 2. Complicated UTI 3. Urinary Incontinence 4. Seizures 5. Peptic Ulcer Disease  6. Elevated CV Risk 7. COPD   Active Problems:   Ataxia   Numbness   Lumbar spinal stenosis   Discharge Medications: Allergies as of 11/06/2016      Reactions   Acetaminophen Other (See Comments)   Pt is unable to take this medication due to liver disease.     Aleve [naproxen] Other (See Comments)   Pt is unable to take this medication due to liver disease.     Beta Adrenergic Blockers Other (See Comments)   Reaction:  Disorientation    Codeine Hives, Other (See Comments)   Reaction:  Inflammation   Metoprolol Other (See Comments)   Reaction:  Dizziness    Nsaids Other (See Comments)   Pt is unable to take this medication due to liver disease.        Medication List    STOP taking these medications   chlorpheniramine-HYDROcodone 10-8 MG/5ML Suer Commonly known as:  TUSSIONEX PENNKINETIC ER     TAKE these medications   albuterol 108 (90 Base) MCG/ACT inhaler Commonly known as:  PROAIR HFA inhale 2 puffs INTO THE LUNGS every 6 hours if needed for wheezing What changed:  how much to take  how to take this  when to take this  reasons to take this  additional instructions   alendronate 70 MG tablet Commonly known as:  FOSAMAX Take 1 tablet (70 mg total) by mouth once a week. Take with a full glass of water on an empty stomach. What changed:  when to take this  additional instructions   aspirin 81 MG chewable tablet Chew 81 mg by mouth daily.   atorvastatin 80 MG tablet Commonly known as:  LIPITOR Take 1 tablet (80 mg total) by mouth daily at 6 PM.   b complex vitamins tablet Take 1 tablet by mouth daily.     Biotin 1000 MCG tablet Take 1,000 mcg by mouth daily.   clopidogrel 75 MG tablet Commonly known as:  PLAVIX take 1 tablet by mouth every morning   Co-Enzyme Q-10 100 MG Caps Take 100 mg by mouth daily.   desonide 0.05 % cream Commonly known as:  DESOWEN Apply 1 application topically 2 (two) times daily. Pt applies to hair line.   DULoxetine 30 MG capsule Commonly known as:  CYMBALTA Take 1 capsule (30 mg total) by mouth daily.   famotidine 20 MG tablet Commonly known as:  PEPCID Take 20 mg by mouth daily.   fluticasone 50 MCG/ACT nasal spray Commonly known as:  FLONASE instill 1 spray into each nostril once daily   guaifenesin 400 MG Tabs tablet Commonly known as:  HUMIBID E Take 400 mg by mouth 2 (two) times daily.   isosorbide mononitrate 30 MG 24 hr tablet Commonly known as:  IMDUR Take 1 tablet (30 mg total) by mouth daily.   ketotifen 0.025 % ophthalmic solution Commonly known as:  [employer] ANTIHISTAMINE EYE DROPS Place 1 drop into both eyes 2 (two) times daily. What changed:  when to take this  reasons to take this   lamoTRIgine 100 MG tablet Commonly known as:  LAMICTAL Take 1/2 tablet ( 50 mg) by mouth in the AM. Take 1 tablet (  100 mg) by mouth in the PM. What changed:  how much to take  how to take this  when to take this  additional instructions   levocetirizine 5 MG tablet Commonly known as:  XYZAL Take 5 mg by mouth every evening.   lidocaine 5 % Commonly known as:  LIDODERM Place 1 patch onto the skin daily. Pt applies to back.  Remove & Discard patch within 12 hours or as directed by MD   multivitamin with minerals Tabs tablet Take 1 tablet by mouth daily.   oxyCODONE 5 MG immediate release tablet Commonly known as:  Oxy IR/ROXICODONE Take 0.5 tablets (2.5 mg total) by mouth daily as needed for severe pain. What changed:  how much to take  when to take this   SPIRIVA RESPIMAT 2.5 MCG/ACT Aers Generic drug:  Tiotropium Bromide  Monohydrate Inhale 2 puffs into the lungs daily.   vitamin C 1000 MG tablet Take 1,000 mg by mouth daily.   Vitamin D 2000 units tablet Take 2,000 Units by mouth daily.      Disposition and follow-up:   Todd [name] was discharged from [facility] in Good condition.  At the hospital follow up visit please address:  1.  Assess whether the patient has continued to improve in mobility and whether his pain is better controlled after undergoing epidural steroid injection.   2.  Labs / imaging needed at time of follow-up: None  3.  Pending labs/ test needing follow-up: None  Follow-up Appointments: None  Contact information for follow-up providers    Aldine Contes, MD. Go on 11/13/2016.   Specialty:  Internal Medicine Why:  Appointment at 10:15 am.  Contact information: [place], [place] [address] [phone]            Contact information for after-discharge care    Destination    [facility] Follow up.   Specialty:  [place] information: [facility] [place] Hospital Course by problem list: Active Problems:   Ataxia   Numbness   Lumbar spinal stenosis   1. Chronic Lumbar Spinal Stenosis: Patient presented with 1 week history of worsening bilateral leg weakness and numbness with intermittent pain. Presentation was concerning for myelopathy or spinal cord compression. However lumbar MRI demonstrated severe spinal canal stenosis at L3-L4 and severe bilateral neural foraminal stenosis due to a combination of disc bulge, posterior vertebral wall retropulsion and moderate facet arthrosis, as well as mild spinal canal stenosis at L2-L3 and L4-L5 with moderate bilateral L4-L5 neural foraminal stenosis, and unchanged T12, L1, L2, L3 compression fractures from 2016. Neurosurgery saw the patient in the hospital  and determined that these were chronic changes and that he was not a surgical candidate. He was recommended to undergo IR-guided ESI. The patient remained in the hospital for this procedure due to concern from his partner about his mobility and ability to follow-up with IR as an outpatient. The patient eventually underwent in the hospital without complication. He was evaluated by PT/OT throughout his hospital course, who recommended inpatient rehab evaluation, however he did not receive insurance approval and was determined by wife to be best served by short-term SNF.   2. Complicated UTI:  Was found to have U/A with +nitrites and +leuk esterase on presentation. Given patient's mental status he was not able to  verbalize well whether he had been having issues with dysuria or frequency. However this was considered a possible etiology of his worsening mobility and was initially treated with ancef that was transitioned to ciprofloxacin to complete a full 7 day course.   3. Urinary Incontinence: The patient used diapers at home for baseline urinary incontinence. He had a condom catheter placed in the hospital which he tolerated well without issue.   4. Seizures: He had a history of recurrent seizures and had been on daily lamotrigine 50 mg prophylactically. We continued this medicine in the hospital.  5. Peptic Ulcer Disease: Patient had a known history of PUD for which he took pepcid 20 mg QD. We continued this medication in the hospital.  6. Elevated CV Risk: The patient was on lipitor 40 mg for elevated cardiovascular risk, which we continued. He was also chronically on dual antiplatelet therapy, with a combination of aspirin and plavix, due to abnormal ischemic changes on nuclear stress test found in 2011. He has been continued on this medication per his cardiologist. We temporarily held his plavix for 5 days in order for interventional radiology to perform fluoroscopic-guided epidural steroid injection (as  above).   7. COPD: We continued his home spiriva. He had no evidence of decompensation throughout his hospitalization and was able to tolerate PT/OT exercises without dyspnea. He was given pulmonary hygiene throughout his stay.  8. MDD: We continued his home cymbalta. Per patient's wife, he did not have issues with worsening mood during this hospitalization.   9. Chronic lower extremity pain: He was taking between 2.5 and 5 mg of oxycodone on an as needed basis at home whenever he had leg pains. We found that on one occasion 5 mg of oxycodone led to increased drowsiness and decreased alertness. We decreased his dose so that he only ever received 2.5 mg for pain, and this led to an improvement in his mental status and energy levels.  10. Intermittent constipation: We temporarily discontinued PRN miralax per patient wife's request due to concern of increased bowel movement urgency. However he was found to have significant constipation soon afterwards which was accompanied by abdominal pain and discomfort as well as distention on exam. After restarting miralax PRN he had a noted improvement in his abdominal exam and he was recommended to continue this medication on an as-needed basis at home.  Discharge Vitals:   BP 118/71 (BP Location: Right Arm)   Pulse 86   Temp 98 F (36.7 C) (Oral)   Resp 16   Ht 5\' 10"  (1.778 m)   Wt 172 lb (78 kg)   SpO2 95%   BMI 24.68 kg/m   Pertinent Labs, Studies, and Procedures:  Labs On presentation TSH - normal  B12, folate - normal PT/INR - normal APTT - normal CBC w/ diff - normal BMP - normal  Stat troponin - 0.00 UDS - clear  Ethanol - <5 U/A - Positive nitrites, large leukocytes, rare bacteria  Imaging Studies (1) CT head w/o contrast IMPRESSION: No acute intracranial abnormality.  Chronic atrophy with large areas of encephalomalacia as described.  Prior right craniotomy.  Electronically Signed   By: Markus Daft M.D.   On: 10/28/2016  14:51  (2) MRI lumbar spine w/ w/o contrast IMPRESSION: 1. Severe spinal canal stenosis at L3-L4 and severe bilateral neural foraminal stenosis due to a combination of disc bulge, posterior vertebral wall retropulsion and moderate facet arthrosis. 2. Mild spinal canal stenosis at L2-L3 and L4-L5 with moderate bilateral  L4-L5 neural foraminal stenosis. 3. Unchanged appearance of T12, L1, L2 and L3 compression fractures, as compared to 11/20/2014.  Electronically Signed   By: Ulyses Jarred M.D.   On: 10/28/2016 22:07  Procedures (1) IR epidurography CLINICAL DATA:  Lumbosacral spondylosis without myelopathy. Spinal stenosis.  FLUOROSCOPY TIME:  0.4 minutes corresponding to a Dose Area Product of 35.59 Gy*m2  PROCEDURE: The procedure, risks, benefits, and alternatives were explained to the patient. Questions regarding the procedure were encouraged and answered. The patient understands and consents to the procedure.  The patient had been off Plavix for 6 days. The patient has also been off Lovenox for 24 hours.  LUMBAR EPIDURAL INJECTION:  An interlaminar approach was performed on RIGHT at L3-L4. The overlying skin was cleansed and anesthetized. A 20 gauge epidural needle was advanced using loss-of-resistance technique.  DIAGNOSTIC EPIDURAL INJECTION:  Injection of Isovue-M 200 shows a good epidural pattern with spread above and below the level of needle placement, primarily on the RIGHT no vascular opacification is seen. There is partial obscuration of the epidural pattern, due to intestinal barium.  THERAPEUTIC EPIDURAL INJECTION:  120.0 Mg of Depo-Medrol mixed with 2 mL 1% lidocaine were instilled. The procedure was well-tolerated, and the patient was discharged thirty minutes following the injection in good condition.  COMPLICATIONS: None.  IMPRESSION: Technically successful epidural injection on the RIGHT L3-4 # 1.  Electronically Signed   By:  Staci Righter M.D.   On: 11/04/2016 15:05  Discharge Instructions: Discharge Instructions    Call MD for:  severe uncontrolled pain    Complete by:  As directed    Diet - low sodium heart healthy    Complete by:  As directed    Increase activity slowly    Complete by:  As directed       Attestation for Student Documentation:  I personally was present and performed or re-performed the history, physical exam and medical decision-making activities of this service and have verified that the service and findings are accurately documented in the student's note.  [name], MD 11/06/2016, 2:06 PM

## 2016-11-05 NOTE — Progress Notes (Signed)
Noted PT today continues to recommend an inpt rehab admit. I will begin insurance authorization. Decision pending for CIR vs SNF. I contacted wife and she prefers inpt rehab. [phone]

## 2016-11-06 DIAGNOSIS — J449 Chronic obstructive pulmonary disease, unspecified: Secondary | ICD-10-CM | POA: Diagnosis not present

## 2016-11-06 DIAGNOSIS — R278 Other lack of coordination: Secondary | ICD-10-CM | POA: Diagnosis not present

## 2016-11-06 DIAGNOSIS — R05 Cough: Secondary | ICD-10-CM | POA: Diagnosis not present

## 2016-11-06 DIAGNOSIS — R2689 Other abnormalities of gait and mobility: Secondary | ICD-10-CM | POA: Diagnosis not present

## 2016-11-06 DIAGNOSIS — G40909 Epilepsy, unspecified, not intractable, without status epilepticus: Secondary | ICD-10-CM | POA: Diagnosis not present

## 2016-11-06 DIAGNOSIS — Z87820 Personal history of traumatic brain injury: Secondary | ICD-10-CM | POA: Diagnosis not present

## 2016-11-06 DIAGNOSIS — E785 Hyperlipidemia, unspecified: Secondary | ICD-10-CM | POA: Diagnosis not present

## 2016-11-06 DIAGNOSIS — R471 Dysarthria and anarthria: Secondary | ICD-10-CM | POA: Diagnosis not present

## 2016-11-06 DIAGNOSIS — N390 Urinary tract infection, site not specified: Secondary | ICD-10-CM | POA: Diagnosis not present

## 2016-11-06 DIAGNOSIS — M4800 Spinal stenosis, site unspecified: Secondary | ICD-10-CM | POA: Diagnosis not present

## 2016-11-06 DIAGNOSIS — R569 Unspecified convulsions: Secondary | ICD-10-CM | POA: Diagnosis not present

## 2016-11-06 DIAGNOSIS — M48061 Spinal stenosis, lumbar region without neurogenic claudication: Secondary | ICD-10-CM | POA: Diagnosis not present

## 2016-11-06 DIAGNOSIS — R1312 Dysphagia, oropharyngeal phase: Secondary | ICD-10-CM | POA: Diagnosis not present

## 2016-11-06 DIAGNOSIS — I2510 Atherosclerotic heart disease of native coronary artery without angina pectoris: Secondary | ICD-10-CM | POA: Diagnosis not present

## 2016-11-06 DIAGNOSIS — I639 Cerebral infarction, unspecified: Secondary | ICD-10-CM | POA: Diagnosis not present

## 2016-11-06 DIAGNOSIS — M6281 Muscle weakness (generalized): Secondary | ICD-10-CM | POA: Diagnosis not present

## 2016-11-06 MED ORDER — DULOXETINE HCL 30 MG PO CPEP: 30.0000 mg | ORAL_CAPSULE | Freq: Every day | ORAL | 0 refills | Status: DC

## 2016-11-06 MED ORDER — DOCUSATE SODIUM 100 MG PO CAPS: 100.0000 mg | ORAL_CAPSULE | Freq: Every day | ORAL | Status: DC | PRN

## 2016-11-06 MED ORDER — POLYETHYLENE GLYCOL 3350 17 G PO PACK: 17.0000 g | PACK | Freq: Every day | ORAL | Status: DC | PRN

## 2016-11-06 MED ORDER — OXYCODONE HCL 5 MG PO TABS
2.5000 mg | ORAL_TABLET | Freq: Every day | ORAL | 0 refills | Status: DC | PRN
Start: 2016-11-06 — End: 2017-01-04

## 2016-11-06 MED ORDER — IBUPROFEN 400 MG PO TABS
400.0000 mg | ORAL_TABLET | Freq: Four times a day (QID) | ORAL | Status: DC | PRN
  Administered 2016-11-06: 400 mg via ORAL
  Filled 2016-11-06: qty 1

## 2016-11-06 MED ORDER — DULOXETINE HCL 30 MG PO CPEP: 30.0000 mg | ORAL_CAPSULE | Freq: Every day | ORAL | 0 refills | Status: AC

## 2016-11-06 NOTE — Progress Notes (Signed)
[name] significant other requesting Advil for HA.  MD notified.

## 2016-11-06 NOTE — Progress Notes (Addendum)
I have received insurance denial from [facility] for an inpt rehab admit. I have contacted pt's wife and she is aware. I have notified SW, Denyse Amass, to assist with SNF. We will sign off at this time. I have notified Attending service resident. [phone]

## 2016-11-06 NOTE — Clinical Social Work Note (Signed)
Clinical Social Worker facilitated patient discharge including contacting patient family and facility to confirm patient discharge plans.  Clinical information faxed to facility and family agreeable with plan.  CSW arranged ambulance transport via [facility] to [facility] .  RN to call report prior to discharge.  Clinical Social Worker will sign off for now as social work intervention is no longer needed. Please consult us again if new need arises.  Jesse Uzma Hellmer, LCSW [phone] 

## 2016-11-06 NOTE — Progress Notes (Signed)
Internal Medicine Attending  Date: 11/06/2016  Patient name: Todd [name] Medical record number: [id number] Date of birth: [birth year] Age: 71 y.o. Gender: male  I saw and evaluated the patient. I reviewed the resident/sub-intern's note by Dr. Amparo Bristol. [place] and I agree with the resident/sub-intern's findings and plans as documented in their progress note.

## 2016-11-06 NOTE — Clinical Social Work Placement (Signed)
   CLINICAL SOCIAL WORK PLACEMENT  NOTE  Date:  11/06/2016  Patient Details  Name: Todd [name] MRN: [id number] Date of Birth: 04-Oct-1945  Clinical Social Work is seeking post-discharge placement for this patient at the [place] level of care (*CSW will initial, date and re-position this form in  chart as items are completed):  Yes   Patient/family provided with [place] Work Department's list of facilities offering this level of care within the geographic area requested by the patient (or if unable, by the patient's family).  Yes   Patient/family informed of their freedom to choose among providers that offer the needed level of care, that participate in Medicare, Medicaid or managed care program needed by the patient, have an available bed and are willing to accept the patient.  Yes   Patient/family informed of [facility]'s ownership interest in [facility] and [facility], as well as of the fact that they are under no obligation to receive care at these facilities.  PASRR submitted to EDS on       PASRR number received on       Existing PASRR number confirmed on 11/04/16     FL2 transmitted to all facilities in geographic area requested by pt/family on 11/04/16     FL2 transmitted to all facilities within larger geographic area on       Patient informed that his/her managed care company has contracts with or will negotiate with certain facilities, including the following:        Yes   Patient/family informed of bed offers received.  Patient chooses bed at [facility]     Physician recommends and patient chooses bed at      Patient to be transferred to [facility] on 11/06/16.  Patient to be transferred to facility by Ambulance     Patient family notified on 11/06/16 of transfer.  Name of family member notified:  [name]     PHYSICIAN       Additional Comment:    Barbette Or, [place]

## 2016-11-06 NOTE — Progress Notes (Signed)
   Subjective: No acute events overnight. [name] wife at bedside and had questions about recovery which we addressed. She understands the plan for discharge today to SNF vs. inpatient rehab and agrees.   Objective:   Vital signs in last 24 hours: Vitals:   11/05/16 1627 11/05/16 2026 11/06/16 0601 11/06/16 0900  BP: 105/65 117/78 127/68 118/71  Pulse: 87 95 85 86  Resp: 16  16 16   Temp: 98.2 F (36.8 C) 98.7 F (37.1 C) 98.5 F (36.9 C) 98 F (36.7 C)  TempSrc: Oral Oral Oral Oral  SpO2: 96% 91% 97% 95%  Weight:  78 kg (172 lb)    Height:       Physical Exam  Constitutional: He appears well-developed and well-nourished. No distress.  Cardiovascular: Normal rate, regular rhythm and normal heart sounds.   Pulmonary/Chest: Effort normal and breath sounds normal. No respiratory distress.  Abdominal: Soft. Bowel sounds are normal. He exhibits no distension. There is no tenderness.  Neurological: He is alert.   Assessment/Plan:   Active Problems:   Ataxia   Numbness   Lumbar spinal stenosis  1. History of bilateral leg weakness, numbness:PT recommended CIR. Pending insurance auth today for final assessment. Will go to [facility] if rejected.  Intermittent constipation, resolved: Miralax daily PRN. Complicated UTI, resolved: Completed 7 day course course of ancef -> cipro.  Urinary incontinence: Incontinent at baseline, continue condom cath here.   Seizures: On lamotrigine ppx for recurrent seizures per neurology, continuing home lamotrigine dose of 50 mg in AM and 100 mg in PM.  Peptic ulcer disease: Continuinghome pepcid 20 mg QD.  CV risk: Continuing lipitor 40 mg. Holding plavix as above for ESI.  COPD: Continuinghome spiriva.  MDD: Continuing home cymbalta.  Dispo: Anticipated discharge today.   Lethea Killings, Medical Student 11/06/2016, 12:48 PM Pager: [phone]  Attestation for Student Documentation:  I personally was present and performed or  re-performed the history, physical exam and medical decision-making activities of this service and have verified that the service and findings are accurately documented in the student's note.  [name], MD 11/06/2016, 1:43 PM

## 2016-11-06 NOTE — Progress Notes (Signed)
Report called to [facility] at [facility].  All questions answered. Jody requesting script for oxy d/t weekend.  MD notified.

## 2016-11-06 NOTE — Progress Notes (Signed)
Per MD no script for oxy d/t home medication.  Updated Jody at [facility].

## 2016-11-06 NOTE — Progress Notes (Signed)
Chaplain Note :  I followed up with patient and patient's significant other regarding a request to complete a Living Will and HCPOA form.  They had one from years past but discovered it had not been notarized.  I met with patient and SO and reviewed the two documents and answered their questions. We agreed on a time no 7/12 to have a notary and witnesses present to formalize the completion.  Forms were completed with one copy provided to nursing secretary to scan into [facility] and the original and two copies given to patient's SO [name] ).   Patient and SO seemed pleased with being able to achieve this while in the Hospital.  I am glad to assist with any further needs.  Wells Guiles  Director Spiritual Care # [phone]

## 2016-11-09 ENCOUNTER — Telehealth: Payer: Self-pay

## 2016-11-09 NOTE — Telephone Encounter (Signed)
Pt's wife states that on 7/11 he had injection for his back pain, she states he says there have been no changes in his pain, no better, no worse. She states she remembers that he would need to wait appr 21 days for new injection. Her question should he wait longer for some decreased pain and when can he get a new injection? Sending to dr's Madelyn Flavors

## 2016-11-09 NOTE — Telephone Encounter (Signed)
Yes this would be too early for another spinal epidural injection. Please advise wife to bring the patient into the clinic if his pain worsens or he develops any new weakness or numbness in his legs. Thanks.

## 2016-11-09 NOTE — Telephone Encounter (Signed)
Needs to speak with a nurse about injection. Please call back.

## 2016-11-10 ENCOUNTER — Other Ambulatory Visit: Payer: Medicare HMO

## 2016-11-10 DIAGNOSIS — G40909 Epilepsy, unspecified, not intractable, without status epilepticus: Secondary | ICD-10-CM | POA: Diagnosis not present

## 2016-11-10 DIAGNOSIS — M48061 Spinal stenosis, lumbar region without neurogenic claudication: Secondary | ICD-10-CM | POA: Diagnosis not present

## 2016-11-10 DIAGNOSIS — I639 Cerebral infarction, unspecified: Secondary | ICD-10-CM | POA: Diagnosis not present

## 2016-11-10 DIAGNOSIS — N390 Urinary tract infection, site not specified: Secondary | ICD-10-CM | POA: Diagnosis not present

## 2016-11-10 NOTE — Telephone Encounter (Signed)
I agree with Dr. Elon Jester advice

## 2016-11-11 DIAGNOSIS — J449 Chronic obstructive pulmonary disease, unspecified: Secondary | ICD-10-CM | POA: Diagnosis not present

## 2016-11-11 DIAGNOSIS — N390 Urinary tract infection, site not specified: Secondary | ICD-10-CM | POA: Diagnosis not present

## 2016-11-11 DIAGNOSIS — R569 Unspecified convulsions: Secondary | ICD-10-CM | POA: Diagnosis not present

## 2016-11-11 DIAGNOSIS — M4800 Spinal stenosis, site unspecified: Secondary | ICD-10-CM | POA: Diagnosis not present

## 2016-11-12 NOTE — Telephone Encounter (Signed)
Pt's wife was notified of response, she states that pt says pain is unchanged but pt is not taking pain med during the day now, he is walking with assist of PT and he is spending more time out of bed. She is encouraged that these are all signs of improvement and reminded that the pain may not and probably wont disappear but if he is able to walk, stay out of bed and go without pain meds during the day these are all positive changes and that should be the goal besides decreased pain is being able to function more and at a higher level. She states that she had not thought of it that way and this is very encouraging, states she will explain this to pt tomorrow when she visits him and she thinks this will be a " lightswitch" for both of them. She will call next week and update dr Dareen Piano on pt's status

## 2016-11-13 ENCOUNTER — Ambulatory Visit: Payer: Medicare HMO

## 2016-11-13 NOTE — Telephone Encounter (Signed)
Thank you :)

## 2016-11-17 DIAGNOSIS — Z87820 Personal history of traumatic brain injury: Secondary | ICD-10-CM | POA: Diagnosis not present

## 2016-11-17 DIAGNOSIS — J449 Chronic obstructive pulmonary disease, unspecified: Secondary | ICD-10-CM | POA: Diagnosis not present

## 2016-11-17 DIAGNOSIS — M48061 Spinal stenosis, lumbar region without neurogenic claudication: Secondary | ICD-10-CM | POA: Diagnosis not present

## 2016-11-17 DIAGNOSIS — E785 Hyperlipidemia, unspecified: Secondary | ICD-10-CM | POA: Diagnosis not present

## 2016-11-24 DIAGNOSIS — M48061 Spinal stenosis, lumbar region without neurogenic claudication: Secondary | ICD-10-CM | POA: Diagnosis not present

## 2016-11-24 DIAGNOSIS — J449 Chronic obstructive pulmonary disease, unspecified: Secondary | ICD-10-CM | POA: Diagnosis not present

## 2016-11-24 DIAGNOSIS — I639 Cerebral infarction, unspecified: Secondary | ICD-10-CM | POA: Diagnosis not present

## 2016-11-24 DIAGNOSIS — G40909 Epilepsy, unspecified, not intractable, without status epilepticus: Secondary | ICD-10-CM | POA: Diagnosis not present

## 2016-11-25 ENCOUNTER — Telehealth: Payer: Self-pay

## 2016-11-25 NOTE — Telephone Encounter (Signed)
Asking to speak with Todd [name]. Please call back.  

## 2016-11-25 NOTE — Telephone Encounter (Signed)
Rtc, lm 

## 2016-11-27 DIAGNOSIS — S062X0S Diffuse traumatic brain injury without loss of consciousness, sequela: Secondary | ICD-10-CM | POA: Diagnosis not present

## 2016-11-27 DIAGNOSIS — R2689 Other abnormalities of gait and mobility: Secondary | ICD-10-CM | POA: Diagnosis not present

## 2016-11-27 DIAGNOSIS — J449 Chronic obstructive pulmonary disease, unspecified: Secondary | ICD-10-CM | POA: Diagnosis not present

## 2016-11-27 DIAGNOSIS — H547 Unspecified visual loss: Secondary | ICD-10-CM | POA: Diagnosis not present

## 2016-11-27 DIAGNOSIS — K219 Gastro-esophageal reflux disease without esophagitis: Secondary | ICD-10-CM | POA: Diagnosis not present

## 2016-11-27 DIAGNOSIS — G40909 Epilepsy, unspecified, not intractable, without status epilepticus: Secondary | ICD-10-CM | POA: Diagnosis not present

## 2016-11-27 DIAGNOSIS — K759 Inflammatory liver disease, unspecified: Secondary | ICD-10-CM | POA: Diagnosis not present

## 2016-11-27 DIAGNOSIS — Z8701 Personal history of pneumonia (recurrent): Secondary | ICD-10-CM | POA: Diagnosis not present

## 2016-11-27 DIAGNOSIS — Z87891 Personal history of nicotine dependence: Secondary | ICD-10-CM | POA: Diagnosis not present

## 2016-11-27 NOTE — Telephone Encounter (Signed)
No answer

## 2016-11-30 ENCOUNTER — Telehealth: Payer: Self-pay | Admitting: Internal Medicine

## 2016-11-30 NOTE — Telephone Encounter (Signed)
I agree

## 2016-11-30 NOTE — Telephone Encounter (Signed)
Todd [name] FROM [facility], DR. Palomas LEFT MESSAGE FOR HIM THAT THERE WERE LIMITS FOR P/T DUE TO BACK INJURY, PLEASE ADVISE WHAT THE ORDER WILL BE, [phone]

## 2016-11-30 NOTE — Telephone Encounter (Signed)
Todd [name] [phone] FROM [facility], PATIENT WAS [place], SKILLED NURSING. PERFORMED ASSESSMENT, REQUEST 2 TIMES A WEEK FOR 4 WEEKS OF P/T FOR FALL RISK REDUCTION, SUMMARY WAS FAXED OVER.

## 2016-12-03 DIAGNOSIS — I2510 Atherosclerotic heart disease of native coronary artery without angina pectoris: Secondary | ICD-10-CM | POA: Diagnosis not present

## 2016-12-03 DIAGNOSIS — S22080D Wedge compression fracture of T11-T12 vertebra, subsequent encounter for fracture with routine healing: Secondary | ICD-10-CM | POA: Diagnosis not present

## 2016-12-03 DIAGNOSIS — G40909 Epilepsy, unspecified, not intractable, without status epilepticus: Secondary | ICD-10-CM | POA: Diagnosis not present

## 2016-12-03 DIAGNOSIS — S32010D Wedge compression fracture of first lumbar vertebra, subsequent encounter for fracture with routine healing: Secondary | ICD-10-CM | POA: Diagnosis not present

## 2016-12-03 DIAGNOSIS — S062X0S Diffuse traumatic brain injury without loss of consciousness, sequela: Secondary | ICD-10-CM | POA: Diagnosis not present

## 2016-12-03 DIAGNOSIS — M48061 Spinal stenosis, lumbar region without neurogenic claudication: Secondary | ICD-10-CM | POA: Diagnosis not present

## 2016-12-03 DIAGNOSIS — S32020D Wedge compression fracture of second lumbar vertebra, subsequent encounter for fracture with routine healing: Secondary | ICD-10-CM | POA: Diagnosis not present

## 2016-12-03 DIAGNOSIS — S32030D Wedge compression fracture of third lumbar vertebra, subsequent encounter for fracture with routine healing: Secondary | ICD-10-CM | POA: Diagnosis not present

## 2016-12-03 DIAGNOSIS — J449 Chronic obstructive pulmonary disease, unspecified: Secondary | ICD-10-CM | POA: Diagnosis not present

## 2016-12-04 DIAGNOSIS — S32030D Wedge compression fracture of third lumbar vertebra, subsequent encounter for fracture with routine healing: Secondary | ICD-10-CM | POA: Diagnosis not present

## 2016-12-04 DIAGNOSIS — S062X0S Diffuse traumatic brain injury without loss of consciousness, sequela: Secondary | ICD-10-CM | POA: Diagnosis not present

## 2016-12-04 DIAGNOSIS — I2510 Atherosclerotic heart disease of native coronary artery without angina pectoris: Secondary | ICD-10-CM | POA: Diagnosis not present

## 2016-12-04 DIAGNOSIS — S22080D Wedge compression fracture of T11-T12 vertebra, subsequent encounter for fracture with routine healing: Secondary | ICD-10-CM | POA: Diagnosis not present

## 2016-12-04 DIAGNOSIS — G40909 Epilepsy, unspecified, not intractable, without status epilepticus: Secondary | ICD-10-CM | POA: Diagnosis not present

## 2016-12-04 DIAGNOSIS — S32020D Wedge compression fracture of second lumbar vertebra, subsequent encounter for fracture with routine healing: Secondary | ICD-10-CM | POA: Diagnosis not present

## 2016-12-04 DIAGNOSIS — M48061 Spinal stenosis, lumbar region without neurogenic claudication: Secondary | ICD-10-CM | POA: Diagnosis not present

## 2016-12-04 DIAGNOSIS — J449 Chronic obstructive pulmonary disease, unspecified: Secondary | ICD-10-CM | POA: Diagnosis not present

## 2016-12-04 DIAGNOSIS — S32010D Wedge compression fracture of first lumbar vertebra, subsequent encounter for fracture with routine healing: Secondary | ICD-10-CM | POA: Diagnosis not present

## 2016-12-04 NOTE — Telephone Encounter (Signed)
Spoke w/ jim hoffman, PT, [facility], gave VO for PT 2x week for 4 weeks to work on gait, balance, safety He states [facility] will be calling for VO as well as OT. He ask about restrictions on pt's activity and was informed that there were none, ask if he had any he would suggest and he answered no. Will send VO's as nsg and ot call in Also ms. stallings is concerned about the injections before they leave for [place]

## 2016-12-04 NOTE — Telephone Encounter (Signed)
I agree... Thank you Helen 

## 2016-12-07 ENCOUNTER — Telehealth: Payer: Self-pay | Admitting: *Deleted

## 2016-12-07 ENCOUNTER — Telehealth: Payer: Self-pay | Admitting: Internal Medicine

## 2016-12-07 ENCOUNTER — Telehealth: Payer: Self-pay

## 2016-12-07 DIAGNOSIS — S062X0S Diffuse traumatic brain injury without loss of consciousness, sequela: Secondary | ICD-10-CM | POA: Diagnosis not present

## 2016-12-07 DIAGNOSIS — I2510 Atherosclerotic heart disease of native coronary artery without angina pectoris: Secondary | ICD-10-CM | POA: Diagnosis not present

## 2016-12-07 DIAGNOSIS — S22080D Wedge compression fracture of T11-T12 vertebra, subsequent encounter for fracture with routine healing: Secondary | ICD-10-CM | POA: Diagnosis not present

## 2016-12-07 DIAGNOSIS — G40909 Epilepsy, unspecified, not intractable, without status epilepticus: Secondary | ICD-10-CM | POA: Diagnosis not present

## 2016-12-07 DIAGNOSIS — M48061 Spinal stenosis, lumbar region without neurogenic claudication: Secondary | ICD-10-CM | POA: Diagnosis not present

## 2016-12-07 DIAGNOSIS — J449 Chronic obstructive pulmonary disease, unspecified: Secondary | ICD-10-CM | POA: Diagnosis not present

## 2016-12-07 DIAGNOSIS — S32020D Wedge compression fracture of second lumbar vertebra, subsequent encounter for fracture with routine healing: Secondary | ICD-10-CM | POA: Diagnosis not present

## 2016-12-07 DIAGNOSIS — S32030D Wedge compression fracture of third lumbar vertebra, subsequent encounter for fracture with routine healing: Secondary | ICD-10-CM | POA: Diagnosis not present

## 2016-12-07 DIAGNOSIS — S32010D Wedge compression fracture of first lumbar vertebra, subsequent encounter for fracture with routine healing: Secondary | ICD-10-CM | POA: Diagnosis not present

## 2016-12-07 NOTE — Telephone Encounter (Signed)
Alexis with [facility] requesting Vo. Please call back.

## 2016-12-07 NOTE — Telephone Encounter (Signed)
[facility] OT VO given 2x week for 4 weeks. This will be for transfers, safety do you agree?

## 2016-12-07 NOTE — Telephone Encounter (Signed)
[facility] OT requesting VO

## 2016-12-07 NOTE — Telephone Encounter (Signed)
VO to Todd [name] for [facility] for 3 weeks 1 time a week for medication and disease management, do you agree?

## 2016-12-07 NOTE — Telephone Encounter (Signed)
Received call from [name] (PT for [facility]) reporting patient had a "spell/seizure"?before he arrived @ patient's home. Reports patient having reduced energy, increased confusion. BP 84/60, then 76/58 while standing.  Spoke to wife, she couldn't tell if patient had his "mini seizures" but agreed that patient is more confused & BP is usually in the 120s.systolic. Overheard her asking patient what day/time it is & patient is oriented. No available ACC slot this pm. Advised wife if symptoms get worse or does not get better, and recheck BP. If bp keeps dropping then go to ED. Verbalized understanding. She said she will call us tomorrow for update.

## 2016-12-07 NOTE — Telephone Encounter (Signed)
I agree

## 2016-12-07 NOTE — Telephone Encounter (Signed)
Rtc, lm for rtc 

## 2016-12-08 ENCOUNTER — Ambulatory Visit: Payer: Medicare HMO

## 2016-12-08 ENCOUNTER — Telehealth: Payer: Self-pay | Admitting: Internal Medicine

## 2016-12-08 NOTE — Telephone Encounter (Signed)
I agree

## 2016-12-08 NOTE — Telephone Encounter (Signed)
Thank you :)

## 2016-12-08 NOTE — Telephone Encounter (Signed)
Thank you Todd [name]... Please call him today and ask him to come to [facility]

## 2016-12-08 NOTE — Telephone Encounter (Signed)
[name] wife called back and stated he needs to go ahead and be seen.  Per Doris patient being seen today @ 2:15pm in the [facility].

## 2016-12-08 NOTE — Telephone Encounter (Signed)
Spoke to pt's wife - stated probably would not be able to come today but maybe tomorrow. She's not at home at this moment, will call back when she gets home.

## 2016-12-08 NOTE — Telephone Encounter (Signed)
Pt's wife called back to notify the office she will not be able to work him in to be seen this week at all due to his other appt's.  She states, " He is doing fine today and if anything changes she will give the office a call."

## 2016-12-08 NOTE — Telephone Encounter (Signed)
Thank you Glenda... 

## 2016-12-09 ENCOUNTER — Telehealth: Payer: Self-pay | Admitting: *Deleted

## 2016-12-09 ENCOUNTER — Observation Stay (HOSPITAL_COMMUNITY)
Admission: EM | Admit: 2016-12-09 | Discharge: 2016-12-16 | Disposition: A | Discharge status: Home / Self Care | Payer: Medicare HMO | Attending: Student in an Organized Health Care Education/Training Program | Admitting: Student in an Organized Health Care Education/Training Program

## 2016-12-09 ENCOUNTER — Emergency Department (HOSPITAL_COMMUNITY): Payer: Medicare HMO

## 2016-12-09 ENCOUNTER — Encounter (HOSPITAL_COMMUNITY): Payer: Self-pay | Admitting: Neurology

## 2016-12-09 ENCOUNTER — Ambulatory Visit: Payer: Medicare HMO | Admitting: Neurology

## 2016-12-09 DIAGNOSIS — J449 Chronic obstructive pulmonary disease, unspecified: Secondary | ICD-10-CM | POA: Diagnosis not present

## 2016-12-09 DIAGNOSIS — Z87891 Personal history of nicotine dependence: Secondary | ICD-10-CM | POA: Insufficient documentation

## 2016-12-09 DIAGNOSIS — R29810 Facial weakness: Secondary | ICD-10-CM | POA: Insufficient documentation

## 2016-12-09 DIAGNOSIS — M4850XS Collapsed vertebra, not elsewhere classified, site unspecified, sequela of fracture: Secondary | ICD-10-CM | POA: Insufficient documentation

## 2016-12-09 DIAGNOSIS — Z8709 Personal history of other diseases of the respiratory system: Secondary | ICD-10-CM | POA: Diagnosis not present

## 2016-12-09 DIAGNOSIS — M549 Dorsalgia, unspecified: Secondary | ICD-10-CM | POA: Diagnosis not present

## 2016-12-09 DIAGNOSIS — G40909 Epilepsy, unspecified, not intractable, without status epilepticus: Secondary | ICD-10-CM | POA: Insufficient documentation

## 2016-12-09 DIAGNOSIS — Z7982 Long term (current) use of aspirin: Secondary | ICD-10-CM | POA: Diagnosis not present

## 2016-12-09 DIAGNOSIS — Z8619 Personal history of other infectious and parasitic diseases: Secondary | ICD-10-CM | POA: Diagnosis not present

## 2016-12-09 DIAGNOSIS — R4182 Altered mental status, unspecified: Secondary | ICD-10-CM | POA: Diagnosis not present

## 2016-12-09 DIAGNOSIS — Z885 Allergy status to narcotic agent status: Secondary | ICD-10-CM | POA: Insufficient documentation

## 2016-12-09 DIAGNOSIS — S069X0S Unspecified intracranial injury without loss of consciousness, sequela: Secondary | ICD-10-CM | POA: Insufficient documentation

## 2016-12-09 DIAGNOSIS — I2510 Atherosclerotic heart disease of native coronary artery without angina pectoris: Secondary | ICD-10-CM | POA: Diagnosis not present

## 2016-12-09 DIAGNOSIS — Z886 Allergy status to analgesic agent status: Secondary | ICD-10-CM | POA: Insufficient documentation

## 2016-12-09 DIAGNOSIS — Z85828 Personal history of other malignant neoplasm of skin: Secondary | ICD-10-CM | POA: Insufficient documentation

## 2016-12-09 DIAGNOSIS — R32 Unspecified urinary incontinence: Secondary | ICD-10-CM | POA: Diagnosis not present

## 2016-12-09 DIAGNOSIS — X58XXXS Exposure to other specified factors, sequela: Secondary | ICD-10-CM | POA: Diagnosis not present

## 2016-12-09 DIAGNOSIS — Z79899 Other long term (current) drug therapy: Secondary | ICD-10-CM | POA: Insufficient documentation

## 2016-12-09 DIAGNOSIS — K219 Gastro-esophageal reflux disease without esophagitis: Secondary | ICD-10-CM | POA: Diagnosis not present

## 2016-12-09 DIAGNOSIS — Z888 Allergy status to other drugs, medicaments and biological substances status: Secondary | ICD-10-CM | POA: Insufficient documentation

## 2016-12-09 DIAGNOSIS — R471 Dysarthria and anarthria: Secondary | ICD-10-CM | POA: Diagnosis not present

## 2016-12-09 DIAGNOSIS — Z87820 Personal history of traumatic brain injury: Secondary | ICD-10-CM | POA: Insufficient documentation

## 2016-12-09 DIAGNOSIS — R270 Ataxia, unspecified: Secondary | ICD-10-CM | POA: Diagnosis not present

## 2016-12-09 DIAGNOSIS — R531 Weakness: Principal | ICD-10-CM

## 2016-12-09 DIAGNOSIS — M48061 Spinal stenosis, lumbar region without neurogenic claudication: Secondary | ICD-10-CM | POA: Diagnosis not present

## 2016-12-09 DIAGNOSIS — Z808 Family history of malignant neoplasm of other organs or systems: Secondary | ICD-10-CM | POA: Insufficient documentation

## 2016-12-09 DIAGNOSIS — R269 Unspecified abnormalities of gait and mobility: Secondary | ICD-10-CM

## 2016-12-09 DIAGNOSIS — Z8249 Family history of ischemic heart disease and other diseases of the circulatory system: Secondary | ICD-10-CM | POA: Insufficient documentation

## 2016-12-09 DIAGNOSIS — R404 Transient alteration of awareness: Secondary | ICD-10-CM | POA: Diagnosis not present

## 2016-12-09 DIAGNOSIS — R159 Full incontinence of feces: Secondary | ICD-10-CM | POA: Diagnosis not present

## 2016-12-09 DIAGNOSIS — Z800 Family history of malignant neoplasm of digestive organs: Secondary | ICD-10-CM | POA: Insufficient documentation

## 2016-12-09 LAB — URINALYSIS, ROUTINE W REFLEX MICROSCOPIC
Bilirubin Urine: NEGATIVE
Glucose, UA: NEGATIVE mg/dL
Hgb urine dipstick: NEGATIVE
Ketones, ur: NEGATIVE mg/dL
Leukocytes, UA: NEGATIVE
Nitrite: NEGATIVE
Protein, ur: NEGATIVE mg/dL
Specific Gravity, Urine: 1.018 (ref 1.005–1.030)
pH: 7 (ref 5.0–8.0)

## 2016-12-09 LAB — CBC WITH DIFFERENTIAL/PLATELET
Basophils Absolute: 0 10*3/uL (ref 0.0–0.1)
Basophils Relative: 0 %
Eosinophils Absolute: 0.2 10*3/uL (ref 0.0–0.7)
Eosinophils Relative: 3 %
HCT: 40.7 % (ref 39.0–52.0)
Hemoglobin: 13.2 g/dL (ref 13.0–17.0)
Lymphocytes Relative: 22 %
Lymphs Abs: 1.6 10*3/uL (ref 0.7–4.0)
MCH: 32.1 pg (ref 26.0–34.0)
MCHC: 32.4 g/dL (ref 30.0–36.0)
MCV: 99 fL (ref 78.0–100.0)
Monocytes Absolute: 0.4 10*3/uL (ref 0.1–1.0)
Monocytes Relative: 6 %
Neutro Abs: 5.2 10*3/uL (ref 1.7–7.7)
Neutrophils Relative %: 69 %
Platelets: 200 10*3/uL (ref 150–400)
RBC: 4.11 MIL/uL — ABNORMAL LOW (ref 4.22–5.81)
RDW: 13.8 % (ref 11.5–15.5)
WBC: 7.5 10*3/uL (ref 4.0–10.5)

## 2016-12-09 LAB — BASIC METABOLIC PANEL
Anion gap: 6 (ref 5–15)
BUN: 25 mg/dL — ABNORMAL HIGH (ref 6–20)
CO2: 26 mmol/L (ref 22–32)
Calcium: 8.5 mg/dL — ABNORMAL LOW (ref 8.9–10.3)
Chloride: 110 mmol/L (ref 101–111)
Creatinine, Ser: 0.92 mg/dL (ref 0.61–1.24)
GFR calc Af Amer: >60 mL/min (ref >60)
GFR calc non Af Amer: >60 mL/min (ref >60)
Glucose, Bld: 95 mg/dL (ref 65–99)
Potassium: 3.9 mmol/L (ref 3.5–5.1)
Sodium: 142 mmol/L (ref 135–145)

## 2016-12-09 MED ORDER — CO-ENZYME Q-10 100 MG PO CAPS: 100.0000 mg | ORAL_CAPSULE | Freq: Every day | ORAL | Status: DC

## 2016-12-09 MED ORDER — ALBUTEROL SULFATE (2.5 MG/3ML) 0.083% IN NEBU: 2.5000 mg | INHALATION_SOLUTION | Freq: Four times a day (QID) | RESPIRATORY_TRACT | Status: DC | PRN

## 2016-12-09 MED ORDER — VITAMIN C 500 MG PO TABS
1000.0000 mg | ORAL_TABLET | Freq: Every day | ORAL | Status: DC
  Administered 2016-12-10 – 2016-12-16 (×7): 1000 mg via ORAL
  Filled 2016-12-09 (×7): qty 2

## 2016-12-09 MED ORDER — ASPIRIN 81 MG PO CHEW
81.0000 mg | CHEWABLE_TABLET | Freq: Every day | ORAL | Status: DC
  Administered 2016-12-10 – 2016-12-16 (×7): 81 mg via ORAL
  Filled 2016-12-09 (×7): qty 1

## 2016-12-09 MED ORDER — LEVOCETIRIZINE DIHYDROCHLORIDE 5 MG PO TABS: 5.0000 mg | ORAL_TABLET | Freq: Every evening | ORAL | Status: DC

## 2016-12-09 MED ORDER — OXYCODONE HCL 5 MG PO TABS
2.5000 mg | ORAL_TABLET | Freq: Every day | ORAL | Status: DC | PRN
  Administered 2016-12-11 – 2016-12-15 (×3): 2.5 mg via ORAL
  Filled 2016-12-09 (×3): qty 1

## 2016-12-09 MED ORDER — CLOPIDOGREL BISULFATE 75 MG PO TABS
75.0000 mg | ORAL_TABLET | Freq: Every morning | ORAL | Status: DC
  Administered 2016-12-10 – 2016-12-16 (×7): 75 mg via ORAL
  Filled 2016-12-09 (×7): qty 1

## 2016-12-09 MED ORDER — ADULT MULTIVITAMIN W/MINERALS CH
1.0000 | ORAL_TABLET | Freq: Every day | ORAL | Status: DC
  Administered 2016-12-10 – 2016-12-16 (×7): 1 via ORAL
  Filled 2016-12-09 (×7): qty 1

## 2016-12-09 MED ORDER — ENOXAPARIN SODIUM 40 MG/0.4ML ~~LOC~~ SOLN
40.0000 mg | Freq: Every day | SUBCUTANEOUS | Status: DC
  Administered 2016-12-10 – 2016-12-15 (×7): 40 mg via SUBCUTANEOUS
  Filled 2016-12-09 (×7): qty 0.4

## 2016-12-09 MED ORDER — DULOXETINE HCL 30 MG PO CPEP
30.0000 mg | ORAL_CAPSULE | Freq: Every day | ORAL | Status: DC
  Administered 2016-12-10 – 2016-12-16 (×7): 30 mg via ORAL
  Filled 2016-12-09 (×8): qty 1

## 2016-12-09 MED ORDER — SODIUM CHLORIDE 0.9 % IV SOLN
INTRAVENOUS | Status: DC
  Administered 2016-12-10: 01:00:00 via INTRAVENOUS

## 2016-12-09 MED ORDER — GUAIFENESIN 400 MG PO TABS: 400.0000 mg | ORAL_TABLET | Freq: Two times a day (BID) | ORAL | Status: DC

## 2016-12-09 MED ORDER — GUAIFENESIN 200 MG PO TABS
400.0000 mg | ORAL_TABLET | Freq: Two times a day (BID) | ORAL | Status: DC
  Administered 2016-12-10 – 2016-12-16 (×11): 400 mg via ORAL
  Filled 2016-12-09 (×15): qty 2

## 2016-12-09 MED ORDER — LIDOCAINE 5 % EX PTCH
1.0000 | MEDICATED_PATCH | CUTANEOUS | Status: DC
  Administered 2016-12-10 – 2016-12-16 (×7): 1 via TRANSDERMAL
  Filled 2016-12-09 (×8): qty 1

## 2016-12-09 MED ORDER — SODIUM CHLORIDE 0.9 % IV BOLUS (SEPSIS)
1000.0000 mL | Freq: Once | INTRAVENOUS | Status: AC
  Administered 2016-12-09: 1000 mL via INTRAVENOUS

## 2016-12-09 MED ORDER — LORATADINE 10 MG PO TABS
10.0000 mg | ORAL_TABLET | Freq: Every day | ORAL | Status: DC
  Administered 2016-12-10 – 2016-12-15 (×5): 10 mg via ORAL
  Filled 2016-12-09 (×6): qty 1

## 2016-12-09 MED ORDER — TIOTROPIUM BROMIDE MONOHYDRATE 2.5 MCG/ACT IN AERS: 2.0000 | INHALATION_SPRAY | Freq: Every day | RESPIRATORY_TRACT | Status: DC

## 2016-12-09 MED ORDER — ATORVASTATIN CALCIUM 80 MG PO TABS
80.0000 mg | ORAL_TABLET | Freq: Every day | ORAL | Status: DC
  Administered 2016-12-10 – 2016-12-16 (×7): 80 mg via ORAL
  Filled 2016-12-09 (×7): qty 1

## 2016-12-09 MED ORDER — FLUTICASONE PROPIONATE 50 MCG/ACT NA SUSP
1.0000 | Freq: Every day | NASAL | Status: DC
  Administered 2016-12-10 – 2016-12-16 (×7): 1 via NASAL
  Filled 2016-12-09: qty 16

## 2016-12-09 MED ORDER — B COMPLEX PO TABS: 1.0000 | ORAL_TABLET | Freq: Every day | ORAL | Status: DC

## 2016-12-09 MED ORDER — B COMPLEX-C PO TABS
1.0000 | ORAL_TABLET | Freq: Every day | ORAL | Status: DC
  Administered 2016-12-10 – 2016-12-16 (×7): 1 via ORAL
  Filled 2016-12-09 (×7): qty 1

## 2016-12-09 MED ORDER — FAMOTIDINE 20 MG PO TABS
20.0000 mg | ORAL_TABLET | Freq: Every day | ORAL | Status: DC
Start: 2016-12-10 — End: 2016-12-16
  Administered 2016-12-10 – 2016-12-16 (×7): 20 mg via ORAL
  Filled 2016-12-09 (×7): qty 1

## 2016-12-09 MED ORDER — KETOTIFEN FUMARATE 0.025 % OP SOLN: 1.0000 [drp] | OPHTHALMIC | Status: DC | PRN

## 2016-12-09 MED ORDER — HYDROCORTISONE 1 % EX CREA
TOPICAL_CREAM | Freq: Two times a day (BID) | CUTANEOUS | Status: DC | PRN
  Filled 2016-12-09: qty 28

## 2016-12-09 MED ORDER — BIOTIN 1000 MCG PO TABS: 1000.0000 ug | ORAL_TABLET | Freq: Every day | ORAL | Status: DC

## 2016-12-09 MED ORDER — VITAMIN D 1000 UNITS PO TABS
2000.0000 [IU] | ORAL_TABLET | Freq: Every day | ORAL | Status: DC
  Administered 2016-12-10 – 2016-12-16 (×7): 2000 [IU] via ORAL
  Filled 2016-12-09 (×7): qty 2

## 2016-12-09 MED ORDER — SENNOSIDES-DOCUSATE SODIUM 8.6-50 MG PO TABS
1.0000 | ORAL_TABLET | Freq: Every day | ORAL | Status: DC
  Administered 2016-12-10 – 2016-12-15 (×5): 1 via ORAL
  Filled 2016-12-09 (×6): qty 1

## 2016-12-09 MED ORDER — ISOSORBIDE MONONITRATE ER 30 MG PO TB24
30.0000 mg | ORAL_TABLET | Freq: Every day | ORAL | Status: DC
  Administered 2016-12-10 – 2016-12-16 (×7): 30 mg via ORAL
  Filled 2016-12-09 (×7): qty 1

## 2016-12-09 MED ORDER — TIOTROPIUM BROMIDE MONOHYDRATE 18 MCG IN CAPS
18.0000 ug | ORAL_CAPSULE | Freq: Every day | RESPIRATORY_TRACT | Status: DC
Start: 2016-12-10 — End: 2016-12-16
  Administered 2016-12-10 – 2016-12-16 (×6): 18 ug via RESPIRATORY_TRACT
  Filled 2016-12-09 (×2): qty 5

## 2016-12-09 MED ORDER — LAMOTRIGINE 100 MG PO TABS
100.0000 mg | ORAL_TABLET | Freq: Every day | ORAL | Status: DC
  Administered 2016-12-10 – 2016-12-15 (×5): 100 mg via ORAL
  Filled 2016-12-09 (×6): qty 1

## 2016-12-09 MED ORDER — LAMOTRIGINE 100 MG PO TABS
50.0000 mg | ORAL_TABLET | Freq: Every day | ORAL | Status: DC
  Administered 2016-12-10 – 2016-12-16 (×7): 50 mg via ORAL
  Filled 2016-12-09 (×7): qty 1

## 2016-12-09 NOTE — Telephone Encounter (Signed)
Thank you Bonnita Nasuti... I agree that he needs to be evaluated in the ED

## 2016-12-09 NOTE — ED Notes (Signed)
Pt taken to CT.
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
## 2016-12-09 NOTE — ED Notes (Signed)
Pt back from CT
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
## 2016-12-09 NOTE — Progress Notes (Addendum)
PHARMACIST - PHYSICIAN ORDER COMMUNICATION  CONCERNING: P&T Medication Policy on Herbal Medications  DESCRIPTION:  This patient's order for:  CoQ10 and biotin  has been noted.  This product(s) is classified as an "herbal" or natural product. Due to a lack of definitive safety studies or FDA approval, nonstandard manufacturing practices, plus the potential risk of unknown drug-drug interactions while on inpatient medications, the Pharmacy and Therapeutics Committee does not permit the use of "herbal" or natural products of this type within [facility].   ACTION TAKEN: The pharmacy department is unable to verify this order at this time and your patient has been informed of this safety policy. Please reevaluate patient's clinical condition at discharge and address if the herbal or natural product(s) should be resumed at that time.

## 2016-12-09 NOTE — Progress Notes (Signed)
Patient is admitted to 5M17. Admission VS is stable

## 2016-12-09 NOTE — Telephone Encounter (Signed)
Pt's caregiver/ spouse calls and is distraught, states for a little over 24hrs pt has been weaker and not as responsive to stimuli, he is unable to assist in moving and ambulating, spouse has struggled this am to provide adl's and cannot get him down the stairs in the home now, she states he seems weaker and weaker. She is advised to call 911 and have pt brought to [place] for eval. She becomes very emotional at this point sobbing and stating she " wants him with her not in a place or leaving her she wants him to go to [employer] with her for her job" she is reassured and calmed and advised to take 1 step at a time, she is offered emotional support and ask to call back for continued support if needed

## 2016-12-10 DIAGNOSIS — R269 Unspecified abnormalities of gait and mobility: Secondary | ICD-10-CM

## 2016-12-10 DIAGNOSIS — R531 Weakness: Secondary | ICD-10-CM

## 2016-12-10 DIAGNOSIS — Z886 Allergy status to analgesic agent status: Secondary | ICD-10-CM

## 2016-12-10 DIAGNOSIS — Z87820 Personal history of traumatic brain injury: Secondary | ICD-10-CM

## 2016-12-10 LAB — HEPATIC FUNCTION PANEL
ALBUMIN: 3.7 g/dL (ref 3.5–5.0)
ALK PHOS: 54 U/L (ref 38–126)
ALT: 22 U/L (ref 17–63)
AST: 41 U/L (ref 15–41)
BILIRUBIN TOTAL: 1.3 mg/dL — AB (ref 0.3–1.2)
Bilirubin, Direct: 0.2 mg/dL (ref 0.1–0.5)
Indirect Bilirubin: 1.1 mg/dL — ABNORMAL HIGH (ref 0.3–0.9)
TOTAL PROTEIN: 6.8 g/dL (ref 6.5–8.1)

## 2016-12-10 LAB — PREALBUMIN: Prealbumin: 20.1 mg/dL (ref 18–38)

## 2016-12-10 MED ORDER — IBUPROFEN 200 MG PO TABS
600.0000 mg | ORAL_TABLET | Freq: Four times a day (QID) | ORAL | Status: DC | PRN
Start: 2016-12-10 — End: 2016-12-16
  Administered 2016-12-10 – 2016-12-13 (×3): 600 mg via ORAL
  Filled 2016-12-10 (×4): qty 3

## 2016-12-10 NOTE — Progress Notes (Signed)
   Subjective: [name] is doing well this morning, no specific complaints from the patient or his wife. The wife states she is concerned that is he becoming progressively weaker and is less functional than his baseline (walker and wheelchair with assistance, able to walk stairs). She reports he has done well with rehab in the past but has declined after leaving a facility. She also notes potential "staring spells" of partial seizure activity though she has not witnessed a spell, he has recently had his Lamictal dose adjusted.   Objective:  Vital signs in last 24 hours: Vitals:   12/09/16 2100 12/10/16 0125 12/10/16 0531 12/10/16 1014  BP: 124/74 131/84 128/88 136/84  Pulse: 72 78 70 72  Resp: 16 15 16 14   Temp: 97.8 F (36.6 C) 98.2 F (36.8 C) 98.2 F (36.8 C) 97.6 F (36.4 C)  TempSrc: Oral Oral Oral Oral  SpO2: 94% 95% 95% 95%  Weight: 188 lb 0.8 oz (85.3 kg)     Height: 5\' 11"  (1.803 m)      Physical Exam  Constitutional: No distress.  Cardiovascular: Normal rate and regular rhythm.   Pulmonary/Chest: Effort normal. No respiratory distress.  Abdominal: Soft. There is no tenderness.  Neurological:  5/5 proximal and distal strength in extremities, no focal neurologic deficit   Skin: Skin is warm and dry. He is not diaphoretic.     Assessment/Plan:  Weakness  Pt does not have acute focal neuro deficits, good strength on exam today. He does have a history of spinal stenosis recently treated with epidural steroid injection which provided relief, though exam and report of general weakness does not support this issue causing deficits. 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 His wife is concerned for seizures causing his decline, however he has not had any witnessed events.  --PT/OT eval and treat for further evaluation --SW for potential placement issues   --Albumin, Pre-albumin    --Up out of bed with assistance    Dispo: Anticipated discharge in approximately 0-1 day(s).   Tawny Asal, MD 12/10/2016, 2:03 PM Pager: [phone]

## 2016-12-10 NOTE — NC FL2 (Signed)
[place] LEVEL OF CARE SCREENING TOOL     IDENTIFICATION  Patient Name: Todd [name] Birthdate: 12-21-1945 Sex: male Admission Date (Current Location): 12/09/2016  [facility] and [place] Number:  Herbalist and Address:  [facility], [place] [address], [place], [place] [address]      Provider Number: [id number]  Attending Physician Name and Address:  Axel Filler, *  Relative Name and Phone Number:       Current Level of Care: Hospital Recommended Level of Care: [place] Prior Approval Number:    Date Approved/Denied:   PASRR Number: [id number]  Discharge Plan: SNF    Current Diagnoses: Patient Active Problem List   Diagnosis Date Noted  . Weakness 12/09/2016  . Lumbar spinal stenosis   . Ataxia 10/28/2016  . Numbness 10/28/2016  . Numbness and tingling of both legs 10/26/2016  . Cough 10/22/2016  . Numbness and tingling 10/22/2016  . History of aspiration pneumonia 07/30/2016  . Gross hematuria 06/11/2016  . Sebaceous cyst 04/14/2016  . Allergic rhinitis 08/21/2015  . 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 Herpes zoster 03/29/2015  . Seizures ([place]) 01/16/2015  . TBI (traumatic brain injury) ([place]) 01/16/2015  . Compression fracture of L2 lumbar vertebra ([place]) 11/20/2014  . Compression fracture of L4 lumbar vertebra ([place]) 11/20/2014  . Insomnia 11/02/2014  . CAD in native artery 10/24/2014  . Coronary artery calcification seen on CAT scan 10/24/2014  . Deviated septum 08/01/2014  . Dysphagia 05/18/2013  . Chronic pulmonary aspiration 05/16/2013  . Abnormality of gait 05/01/2013  . Unspecified constipation 12/07/2012  . COPD (chronic obstructive pulmonary disease) ([place]) 08/17/2012  . Coronary atherosclerosis of native coronary artery 08/17/2012  . Generalized  weakness 04/09/2012  . Osteoporosis with fracture 03/05/2011  . Preventive measure 01/26/2011  . Hyperlipidemia 04/07/2010  . Major depressive disorder, single episode, severe ([place]) 11/13/2009  . Personal history of traumatic brain injury 06/17/2009  . LOSS, HEARING NOS 11/26/2006  . Aphasia 11/26/2006  . HEPATITIS C 08/10/2006  . PEPTIC ULCER DISEASE 08/10/2006  . Urinary incontinence 08/10/2006    Orientation RESPIRATION BLADDER Height & Weight     Self  Normal Incontinent Weight: 188 lb 0.8 oz (85.3 kg) Height:  5\' 11"  (180.3 cm)  BEHAVIORAL SYMPTOMS/MOOD NEUROLOGICAL BOWEL NUTRITION STATUS    Convulsions/Seizures Incontinent Diet (dysphagia 3; honey thick liquids)  AMBULATORY STATUS COMMUNICATION OF NEEDS Skin   Extensive Assist Verbally Normal                       Personal Care Assistance Level of Assistance  Bathing, Dressing Bathing Assistance: Maximum assistance   Dressing Assistance: Maximum assistance     Functional Limitations Info  Speech     Speech Info: Impaired (dysarthria)    SPECIAL CARE FACTORS FREQUENCY  PT (By licensed PT), OT (By licensed OT)     PT Frequency: 5x/wk OT Frequency: 5x/wk            Contractures      Additional Factors Info  Code Status, Allergies, Psychotropic Code Status Info: Full Allergies Info: Acetaminophen, Aleve Naproxen, Beta Adrenergic Blockers, Codeine, Metoprolol, Nsaids Psychotropic Info: Cymbalta 30mg , Lamictal 100mg , Lamictal 50mg          Current Medications (12/10/2016):  This is the current hospital active medication list Current Facility-Administered Medications  Medication Dose Route Frequency Provider Last Rate  Last Dose  . 0.9 %  sodium chloride infusion   Intravenous Continuous Collier Salina, MD 75 mL/hr at 12/10/16 0058    . albuterol (PROVENTIL) (2.5 MG/3ML) 0.083% nebulizer solution 2.5 mg  2.5 mg Nebulization Q6H PRN Rice, Resa Miner, MD      . aspirin chewable tablet 81 mg  81  mg Oral Daily Collier Salina, MD   81 mg at 12/10/16 1138  . atorvastatin (LIPITOR) tablet 80 mg  80 mg Oral q1800 Rice, Resa Miner, MD      . B-complex with vitamin C tablet 1 tablet  1 tablet Oral Daily Axel Filler, MD   1 tablet at 12/10/16 1137  . cholecalciferol (VITAMIN D) tablet 2,000 Units  2,000 Units Oral Daily Collier Salina, MD   2,000 Units at 12/10/16 1138  . clopidogrel (PLAVIX) tablet 75 mg  75 mg Oral q morning - 10a Collier Salina, MD   75 mg at 12/10/16 1139  . DULoxetine (CYMBALTA) DR capsule 30 mg  30 mg Oral Daily Collier Salina, MD   30 mg at 12/10/16 1139  . enoxaparin (LOVENOX) injection 40 mg  40 mg Subcutaneous QHS Collier Salina, MD   40 mg at 12/10/16 0059  . famotidine (PEPCID) tablet 20 mg  20 mg Oral Daily Collier Salina, MD   20 mg at 12/10/16 1138  . fluticasone (FLONASE) 50 MCG/ACT nasal spray 1 spray  1 spray Each Nare Daily Collier Salina, MD   1 spray at 12/10/16 1147  . guaiFENesin tablet 400 mg  400 mg Oral BID Axel Filler, MD   400 mg at 12/10/16 1146  . hydrocortisone cream 1 %   Topical BID PRN Collier Salina, MD      . ibuprofen (ADVIL,MOTRIN) tablet 600 mg  600 mg Oral Q6H PRN [name], DO   600 mg at 12/10/16 1133  . isosorbide mononitrate (IMDUR) 24 hr tablet 30 mg  30 mg Oral Daily Collier Salina, MD   30 mg at 12/10/16 1137  . ketotifen (ZADITOR) 0.025 % ophthalmic solution 1 drop  1 drop Both Eyes PRN Rice, Resa Miner, MD      . lamoTRIgine (LAMICTAL) tablet 100 mg  100 mg Oral QHS Rice, Resa Miner, MD       And  . lamoTRIgine (LAMICTAL) tablet 50 mg  50 mg Oral Daily Collier Salina, MD   50 mg at 12/10/16 1139  . lidocaine (LIDODERM) 5 % 1 patch  1 patch Transdermal Q24H Collier Salina, MD   1 patch at 12/10/16 1136  . loratadine (CLARITIN) tablet 10 mg  10 mg Oral QHS Axel Filler, MD      . multivitamin with minerals tablet 1 tablet  1 tablet  Oral Daily Collier Salina, MD   1 tablet at 12/10/16 1138  . oxyCODONE (Oxy IR/ROXICODONE) immediate release tablet 2.5 mg  2.5 mg Oral Daily PRN Collier Salina, MD      . senna-docusate (Senokot-S) tablet 1 tablet  1 tablet Oral QHS Rice, Resa Miner, MD      . tiotropium [facility]) inhalation capsule 18 mcg  18 mcg Inhalation Daily Axel Filler, MD   18 mcg at 12/10/16 0959  . vitamin C (ASCORBIC ACID) tablet 1,000 mg  1,000 mg Oral Daily Collier Salina, MD   1,000 mg at 12/10/16 1140     Discharge Medications: Please see discharge summary for a  list of discharge medications.  Relevant Imaging Results:  Relevant Lab Results:   Additional Information SS#: [id number]  Geralynn Ochs, LCSW

## 2016-12-10 NOTE — Consult Note (Signed)
   [facility] CM Inpatient Consult   12/10/2016  Todd [name] 05-Feb-1946 [id number]  Patient is listed on the [facility] registry for [facility] Medicare and evaluated for multiple admissions in the past 6 months.  [name] primary care provider is [facility] internal Medicine. Patient is a 43 year with H/O weakness, cancer, TBI, seizures with increased weakness and confusion over the past few days. Current therapy notes are recommending CIR vs Skilled Nursing facility.  Will follow for disposition and needs as appropriate.  For questions, please contact:  [name], RN BSN [facility] Liaison  [phone] business mobile phone Toll free office [phone]

## 2016-12-10 NOTE — Clinical Social Work Note (Signed)
Clinical Social Work Assessment  Patient Details  Name: Todd [name] MRN: [id number] Date of Birth: 09-06-1945  Date of referral:  12/10/16               Reason for consult:  Facility Placement, Discharge Planning                Permission sought to share information with:  Facility Sport and exercise psychologist, Family Supports Permission granted to share information::  Yes, Verbal Permission Granted  Name::     Todd [name]::  VA  Relationship::  Wife  Contact Information:     Housing/Transportation Living arrangements for the past 2 months:  [place], [place] of Information:  Spouse, Case Manager Patient Interpreter Needed:  None Criminal Activity/Legal Involvement Pertinent to Current Situation/Hospitalization:  No - Comment as needed Significant Relationships:  Spouse Lives with:  Self, Spouse Do you feel safe going back to the place where you live?  Yes Need for family participation in patient care:  Yes (Comment) (patient not oriented)  Care giving concerns:  Patient has been at rehab recently at [facility], and are now in copay days that the family cannot afford. Patient has been increasingly hard to care for at home, and will need assistance.   Social Worker assessment / plan:  CSW obtained information from [facility] that patient has active VA benefits for SNF placement. CSW received packet for referral, and filled out information. CSW had patient's spouse complete financial information and fax over to [facility] for SNF placement. Patient will have to wait for a VA bed; family and MD is aware. Patient will be able to receive home health services in the interim until a VA bed can be secured for potential SNF placement.  Employment status:  Retired Nurse, adult PT Recommendations:  [place] / Referral to community resources:     Patient/Family's Response to care:  Patient and family  agreeable to SNF Placement, but cannot afford copays.  Patient/Family's Understanding of and Emotional Response to Diagnosis, Current Treatment, and Prognosis:  Patient and spouse are aware of the patient's increased needs. [name] wife acknowledges that it is increasingly hard to care for him at home and she would like as much help as possible. [name] wife indicated understanding that the process to wait for a VA SNF bed will take some time.  Emotional Assessment Appearance:  Appears stated age Attitude/Demeanor/Rapport:    Affect (typically observed):  Appropriate Orientation:  Oriented to Self Alcohol / Substance use:  Not Applicable Psych involvement (Current and /or in the community):  No (Comment)  Discharge Needs  Concerns to be addressed:  Care Coordination, Financial / Insurance Concerns, Discharge Planning Concerns Readmission within the last 30 days:  No Current discharge risk:  Physical Impairment, Cognitively Impaired Barriers to Discharge:  Continued Medical Work up, No SNF bed, [place], [place] 12/10/2016, 5:08 PM

## 2016-12-10 NOTE — Progress Notes (Signed)
CM consulted for assistance with rehab. Pt was recently in [facility] and has used his rehab days. Wife states they can not afford the co pay days. Per wife patient has VA benefits. CM called Lind Guest with [facility] and patient should be able to get some rehab through the [place]. [facility] faxed the forms needed. Wife and CSW filled them out and faxed them to [place] at the number provided. CM informed the wife that patient most likely will not be placed by VA from the hospital and he would have to d/c home and go to rehab from home. Wife voiced understanding.  Pt was active with [facility] prior to admission. Santiago Glad with [facility] aware of admission and is following for d/c orders.  Pt has all needed DME. Pt is also active with [facility] for 15 hours of aide assistance through the [place].  When patient is ready for d/c home he will transport via [place]. Transport form is on the front of the chart. Bedside RN updated.

## 2016-12-10 NOTE — Care Management Obs Status (Signed)
[place] NOTIFICATION   Patient Details  Name: Todd [name] MRN: [id number] Date of Birth: 09-Mar-1946   Medicare Observation Status Notification Given:  Yes    Pollie Friar, RN 12/10/2016, 2:48 PM

## 2016-12-10 NOTE — Progress Notes (Signed)
Rehab Admissions Coordinator Note:  Patient was screened by Cleatrice Burke for appropriateness for an Inpatient Acute Rehab Consult per OT recommendation.   At this time, we are recommending Todd [name]. Pt recently hospitalized 10/2016 with debility. I pursued insurance approval with [facility] Medicare for an inpt rehab admit at that time, and MD at [facility] denied admission. Felt rehab could be rendered in lower level of care. Pt was discharged to [facility] at that time. Unless pt has had another neurological event, I would expect [employer] Medicare to recommend same level of rehab at [facility]. Please call me with any questions.   Cleatrice Burke 12/10/2016, 3:24 PM  I can be reached at [phone].

## 2016-12-10 NOTE — Evaluation (Signed)
Physical Therapy Evaluation Patient Details Name: Todd [name] MRN: [id number] DOB: [birth year] Today's Date: 12/10/2016   History of Present Illness  Pt is a 71 yo male with pmh of TBI, vertebral compression fractures, spinal stenosis, back pain, weakness, ataxia, COPD, GERD, Skin cancer, Hep C (post treatment), urinary/stool incontinence, Pt brought in by EMS due to concern for decreased motor planning abilities/coordination and decreased independence in mobility and functional transfers. CT scan showed no acute abnormality.   Clinical Impression  Pt admitted secondary to problem above with deficits below. PTA, pt required assist from wife to perform functional mobility tasks, however, wife reports pt has significantly declined in the last couple of days. Pt with recent hospitalization and stay at SNF where the wife feels like he did not receive enough therapy to get him strong enough to come home appropriately and now has resulted in additional hospitalization. This session, the pt is demonstrating good strength but drastically decreased coordination and motor planning to perform ADLs and transfers. Pt requiring mod to max A +2 with multimodal cues to perform functional transfers. PT is STRONGLY recommending CIR level therapy as the Pt will be able to tolerate intense therapy, and could play a key role in strengthening, coordinating and returning Pt to PLOF resulting in better independence in ADL and functional transfers preventing future hospitalizations. Will continue to follow to maximize functional mobility independence and safety.     Follow Up Recommendations CIR    Equipment Recommendations  None recommended by PT    Recommendations for Other Services Rehab consult     Precautions / Restrictions Precautions Precautions: Fall;Other (comment) (watching for seizures) Restrictions Weight Bearing Restrictions: No      Mobility  Bed Mobility Overal bed mobility: Needs  Assistance Bed Mobility: Supine to Sit     Supine to sit: Mod assist;HOB elevated     General bed mobility comments: Pt able to initiate with RLE, required assist to bring LLE to EOB, multimodal cues for sequencing, heavy use of bed rail and assist to bring hips to the EOB. strong posterior lean, assist required to maintaining sitting EOB   Transfers Overall transfer level: Needs assistance Equipment used: 2 person hand held assist Transfers: Sit to/from [facility] Sit to Stand: Mod assist;+2 physical assistance;+2 safety/equipment Stand pivot transfers: Max assist;+2 physical assistance;+2 safety/equipment       General transfer comment: Pt goes into full extension to assist with sit > stand. Pt required MAX A including multimodal cueing for sequencing with pivot, assist to position feet during transfer, LLE buckling during transfer, RLE did not. Required assist with weightshifting and moving BLE during transfers.   Ambulation/Gait                Stairs            Wheelchair Mobility    Modified Rankin (Stroke Patients Only)       Balance Overall balance assessment: Needs assistance Sitting-balance support: Bilateral upper extremity supported;Feet supported Sitting balance-Leahy Scale: Poor Sitting balance - Comments: heavy use of bed rails Postural control: Posterior lean Standing balance support: Bilateral upper extremity supported Standing balance-Leahy Scale: Zero Standing balance comment: LLE buckles, watch lean, does really well with cues to stand up straight                             Pertinent Vitals/Pain Pain Assessment: 0-10 Pain Score: 5  Pain Location: lower back (chronic) Pain Descriptors /  Indicators: Aching;Discomfort Pain Intervention(s): Limited activity within patient's tolerance;Monitored during session;Repositioned    Home Living Family/patient expects to be discharged to:: Inpatient rehab                  Additional Comments: has walker, BSC, platform RW, lift chair, grab bars in home/bathroom, completely set up with all equipment at home.     Prior Function Level of Independence: Needs assistance   Gait / Transfers Assistance Needed: performs short distance gait with RW; was performing transfers with one assist  ADL's / Homemaking Assistance Needed: Pt requires max A with ADLs/selfcare. Pt using grab bars to assist with sit<>stand for peri care/bathing. Can feed self, however required supervision and cues for safety.  Comments: Wife has been caregiver throughout his whole journey, she is very keen to continue caregiving and providing him the support that he needs.     Hand Dominance   Dominant Hand: Right    Extremity/Trunk Assessment   Upper Extremity Assessment Upper Extremity Assessment: Defer to OT evaluation RUE Deficits / Details: 5/5 strength, requires multimodal cues for coordination LUE Deficits / Details: 5/5 strength, requires multimodal cues for coordinatio    Lower Extremity Assessment Lower Extremity Assessment: RLE deficits/detail;LLE deficits/detail RLE Deficits / Details: Decreased motor planning ability noted LLE>RLE. Functional weakness noted during transfers.  LLE Deficits / Details: Decreased motor planning ability noted LLE>RLE. Functional weakness noted during transfers LLE>RLE. Noted buckling in LLE during transfers.     Cervical / Trunk Assessment Cervical / Trunk Assessment: Other exceptions Cervical / Trunk Exceptions: truncal ataxia noted  Communication   Communication: Expressive difficulties  Cognition Arousal/Alertness: Awake/alert Behavior During Therapy: WFL for tasks assessed/performed Overall Cognitive Status: Impaired/Different from baseline Area of Impairment: Problem solving                             Problem Solving: Slow processing;Decreased initiation;Difficulty sequencing;Requires verbal cues;Requires tactile  cues General Comments: Pt has cognitive deficits at baseline, and has demonstrated suddent deficits in motor planning specifically which impact his ability to assist in ADL and are affecting his quality of life.      General Comments General comments (skin integrity, edema, etc.): Pt's wife present during session and very active and capable - but concerned due to SEVERE CHANGE in abilities    Exercises     Assessment/Plan    PT Assessment Patient needs continued PT services  PT Problem List Decreased strength;Decreased balance;Decreased mobility;Decreased coordination;Decreased cognition;Decreased knowledge of use of DME;Decreased knowledge of precautions;Pain       PT Treatment Interventions DME instruction;Gait training;Stair training;Functional mobility training;Therapeutic activities;Therapeutic exercise;Balance training;Neuromuscular re-education;Patient/family education    PT Goals (Current goals can be found in the Care Plan section)  Acute Rehab PT Goals Patient Stated Goal: to get to CIR to get stronger and back to PLOF PT Goal Formulation: With patient/family Time For Goal Achievement: 12/24/16 Potential to Achieve Goals: Good    Frequency Min 4X/week   Barriers to discharge        Co-evaluation PT/OT/SLP Co-Evaluation/Treatment: Yes Reason for Co-Treatment: For patient/therapist safety;To address functional/ADL transfers PT goals addressed during session: Mobility/safety with mobility;Balance;Proper use of DME OT goals addressed during session: ADL's and self-care       AM-PAC PT "6 Clicks" Daily Activity  Outcome Measure Difficulty turning over in bed (including adjusting bedclothes, sheets and blankets)?: Total Difficulty moving from lying on back to sitting on the side of the bed? :  Total Difficulty sitting down on and standing up from a chair with arms (e.g., wheelchair, bedside commode, etc,.)?: Total Help needed moving to and from a bed to chair  (including a wheelchair)?: A Lot Help needed walking in hospital room?: A Lot Help needed climbing 3-5 steps with a railing? : Total 6 Click Score: 8    End of Session Equipment Utilized During Treatment: Gait belt Activity Tolerance: Patient tolerated treatment well Patient left: in chair;with call bell/phone within reach;with chair alarm set;with family/visitor present Nurse Communication: Mobility status PT Visit Diagnosis: Unsteadiness on feet (R26.81);Other abnormalities of gait and mobility (R26.89);Other symptoms and signs involving the nervous system (R29.898);Difficulty in walking, not elsewhere classified (R26.2)    Time: 5208-0223 PT Time Calculation (min) (ACUTE ONLY): 38 min   Charges:   PT Evaluation $PT Eval Moderate Complexity: 1 Mod     PT G Codes:   PT G-Codes **NOT FOR INPATIENT CLASS** Functional Assessment Tool Used: AM-PAC 6 Clicks Basic Mobility Functional Limitation: Mobility: Walking and moving around Mobility: Walking and Moving Around Current Status (V6122): At least 80 percent but less than 100 percent impaired, limited or restricted Mobility: Walking and Moving Around Goal Status [phone]): At least 40 percent but less than 60 percent impaired, limited or restricted    Leighton Ruff, PT, DPT  Acute Rehabilitation Services  Pager: [place] 12/10/2016, 3:33 PM
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
## 2016-12-10 NOTE — Evaluation (Signed)
Occupational Therapy Evaluation Patient Details Name: Todd [name] MRN: [id number] DOB: 1946-01-19 Today's Date: 12/10/2016    History of Present Illness Pt is a 71 yo male with pmh of TBI, vertebral compression fractures, spinal stenosis, back pain, weakness, ataxia, COPD, GERD, Skin cancer, Hep C (post treatment), urinary/stool incontinence, Pt brought in by EMS due to concern for decreased motor planning abilities/coordination and decreased independence in mobility and functional transfers. CT scan showed no acute abnormality.    Clinical Impression   PTA Pt required mod A for dressing/bathing, but was self-feeding with supervision and performing functional transfers at min A level. Pt is currently max A for bathing/dressing and max A +2 for stand pivot transfers. Pt with recent hospitalization and stay at SNF where the wife feels like he did not receive enough therapy to get him strong enough to come home appropriately and now has resulted in additional hospitalization. This session, the Pt is demonstrating good strength but drastically decreased ability to coordinate movements and motor plan for necessary ADL and transfers. Pt will require skilled OT in the acute setting and OT is STRONGLY recommending CIR level therapy as the Pt will be able to tolerate intense therapy, and could play a key role in strengthening, coordinating and returning Pt to PLOF resulting in better independence in ADL and functional transfers preventing future hospitalizations.     Follow Up Recommendations  CIR;Supervision/Assistance - 24 hour    Equipment Recommendations  None recommended by OT    Recommendations for Other Services Rehab consult     Precautions / Restrictions Precautions Precautions: Fall;Other (comment) (watching for seizures) Restrictions Weight Bearing Restrictions: No      Mobility Bed Mobility Overal bed mobility: Needs Assistance Bed Mobility: Supine to Sit     Supine to  sit: Mod assist;HOB elevated     General bed mobility comments: Pt able to initiate with RLE, required assist to bring LLE to EOB, multimodal cues for sequencing, heavy use of bed rail and assist to bring hips to the EOB. strong posterior lean, assist required to maintaining sitting EOB   Transfers Overall transfer level: Needs assistance Equipment used: None Transfers: Sit to/from [facility] Sit to Stand: Mod assist;+2 physical assistance;+2 safety/equipment Stand pivot transfers: Max assist;+2 physical assistance;+2 safety/equipment       General transfer comment: Pt goes into full extension to assist with sit > stand. Pt required MAX A including multimodal cueing for sequencing with pivot, assist to position feet during transfer, LLE buckling during transfer, RLE did not    Balance Overall balance assessment: Needs assistance Sitting-balance support: Bilateral upper extremity supported;Feet supported Sitting balance-Leahy Scale: Poor Sitting balance - Comments: heavy use of bed rails Postural control: Posterior lean Standing balance support: Bilateral upper extremity supported Standing balance-Leahy Scale: Zero Standing balance comment: LLE buckles, watch lean, does really well with cues to stand up straight                           ADL either performed or assessed with clinical judgement   ADL Overall ADL's : Needs assistance/impaired Eating/Feeding: Moderate assistance   Grooming: Sitting;Wash/dry face;Wash/dry hands;Minimal assistance Grooming Details (indicate cue type and reason): ataxic movement noted Upper Body Bathing: Moderate assistance   Lower Body Bathing: Maximal assistance   Upper Body Dressing : Maximal assistance   Lower Body Dressing: Total assistance;+2 for physical assistance;Sit to/from stand Lower Body Dressing Details (indicate cue type and reason): to don  adult diaper Toilet Transfer: Maximal assistance;+2 for physical  assistance;+2 for safety/equipment;Stand-pivot;BSC Toilet Transfer Details (indicate cue type and reason): simulated through transfer to [place] and Hygiene: Total assistance;+2 for physical assistance;+2 for safety/equipment;Sit to/from stand Toileting - Clothing Manipulation Details (indicate cue type and reason): mod A +2 to stand, and then total A for diaper change and peri care     Functional mobility during ADLs: Maximal assistance;+2 for physical assistance;+2 for safety/equipment;Cueing for sequencing;Cueing for safety General ADL Comments: overall decreased coordination and sequencing ability impacting ADL     Vision Patient Visual Report: No change from baseline       Perception     Praxis      Pertinent Vitals/Pain Pain Assessment: 0-10 Pain Score: 5  Pain Location: lower back (chronic) Pain Descriptors / Indicators: Aching;Discomfort Pain Intervention(s): Monitored during session;Repositioned;RN gave pain meds during session     Hand Dominance Right   Extremity/Trunk Assessment Upper Extremity Assessment Upper Extremity Assessment: RUE deficits/detail;LUE deficits/detail RUE Deficits / Details: 5/5 strength, requires multimodal cues for coordination LUE Deficits / Details: 5/5 strength, requires multimodal cues for coordinatio   Lower Extremity Assessment Lower Extremity Assessment: Defer to PT evaluation   Cervical / Trunk Assessment Cervical / Trunk Assessment: Other exceptions Cervical / Trunk Exceptions: truncal ataxia noted   Communication Communication Communication: Expressive difficulties   Cognition Arousal/Alertness: Awake/alert Behavior During Therapy: WFL for tasks assessed/performed Overall Cognitive Status: Impaired/Different from baseline Area of Impairment: Problem solving                             Problem Solving: Slow processing;Decreased initiation;Difficulty sequencing;Requires verbal  cues;Requires tactile cues General Comments: Pt has cognitive deficits at baseline, and has demonstrated suddent deficits in motor planning specifically which impact his ability to assist in ADL and are affecting his quality of life.   General Comments  Pt's wife present during session and very active and capable - but concerned due to SEVERE CHANGE in abilities    Exercises     Shoulder Instructions      Home Living Family/patient expects to be discharged to:: Inpatient rehab                                 Additional Comments: has walker, BSC, platform RW, lift chair, grab bars in home/bathroom, completely set up with all equipment at home.       Prior Functioning/Environment Level of Independence: Needs assistance  Gait / Transfers Assistance Needed: performs short distance gait with RW; was performing transfers with one assist ADL's / Homemaking Assistance Needed: Pt requires max A with ADLs/selfcare. Pt using grab bars to assist with sit<>stand for peri care/bathing. Can feed self, however required supervision and cues for safety. Communication / Swallowing Assistance Needed: Dysphagia diet, dysarthria Comments: Wife has been caregiver throughout his whole journey, she is very keen to continue caregiving and providing him the support that he needs.        OT Problem List: Impaired balance (sitting and/or standing);Decreased coordination;Decreased safety awareness;Decreased knowledge of use of DME or AE;Pain      OT Treatment/Interventions: Self-care/ADL training;Therapeutic exercise;Therapeutic activities;Patient/family education;Balance training;DME and/or AE instruction    OT Goals(Current goals can be found in the care plan section) Acute Rehab OT Goals Patient Stated Goal: to get to CIR to get stronger and back to PLOF OT Goal Formulation: With  patient/family Time For Goal Achievement: 12/24/16 Potential to Achieve Goals: Good ADL Goals Pt Will Perform  Eating: with supervision;with caregiver independent in assisting;sitting Pt Will Perform Grooming: with min assist;with caregiver independent in assisting;sitting Additional ADL Goal #1: Pt will perform bed mobility at min A level prior to engagment in ADL activity with 3 or less verbal cues for sequencing Additional ADL Goal #2: Pt will perform functional transfers (i.e toilet transfer or transfers to recliner) for seated ADL participation at min A level with caregiver independent in assisting.  OT Frequency: Min 3X/week   Barriers to D/C:            Co-evaluation PT/OT/SLP Co-Evaluation/Treatment: Yes Reason for Co-Treatment: For patient/therapist safety;To address functional/ADL transfers   OT goals addressed during session: ADL's and self-care      AM-PAC PT "6 Clicks" Daily Activity     Outcome Measure Help from another person eating meals?: A Lot Help from another person taking care of personal grooming?: A Lot Help from another person toileting, which includes using toliet, bedpan, or urinal?: Total Help from another person bathing (including washing, rinsing, drying)?: A Lot Help from another person to put on and taking off regular upper body clothing?: A Lot Help from another person to put on and taking off regular lower body clothing?: Total 6 Click Score: 10   End of Session Equipment Utilized During Treatment: Gait belt Nurse Communication: Mobility status;Need for lift equipment (STEDY for back to bed)  Activity Tolerance: Patient tolerated treatment well Patient left: in chair;with call bell/phone within reach;with chair alarm set;with family/visitor present;with nursing/sitter in room  OT Visit Diagnosis: Unsteadiness on feet (R26.81);Other abnormalities of gait and mobility (R26.89);Ataxia, unspecified (R27.0)                Time: 2919-1660 OT Time Calculation (min): 40 min Charges:  OT General Charges $OT Visit: 1 Procedure OT Evaluation $OT Eval Moderate  Complexity: 1 Procedure OT Treatments $Self Care/Home Management : 8-22 mins G-Codes: OT G-codes **NOT FOR INPATIENT CLASS** Functional Assessment Tool Used: AM-PAC 6 Clicks Daily Activity Functional Limitation: Self care Self Care Current Status (A0045): At least 60 percent but less than 80 percent impaired, limited or restricted Self Care Goal Status (T9774): At least 20 percent but less than 40 percent impaired, limited or restricted   Hulda Humphrey OTR/L [place] 12/10/2016, 2:59 PM

## 2016-12-11 ENCOUNTER — Telehealth: Payer: Self-pay | Admitting: *Deleted

## 2016-12-11 ENCOUNTER — Encounter (HOSPITAL_COMMUNITY): Payer: Self-pay | Admitting: *Deleted

## 2016-12-11 DIAGNOSIS — Z87820 Personal history of traumatic brain injury: Secondary | ICD-10-CM | POA: Diagnosis not present

## 2016-12-11 DIAGNOSIS — Z886 Allergy status to analgesic agent status: Secondary | ICD-10-CM | POA: Diagnosis not present

## 2016-12-11 DIAGNOSIS — R269 Unspecified abnormalities of gait and mobility: Secondary | ICD-10-CM | POA: Diagnosis not present

## 2016-12-11 DIAGNOSIS — R531 Weakness: Secondary | ICD-10-CM | POA: Diagnosis not present

## 2016-12-11 NOTE — Progress Notes (Signed)
Physical Therapy Treatment Patient Details Name: Todd [name] MRN: [id number] DOB: [birth year] Today's Date: 12/11/2016    History of Present Illness Pt is a 71 yo male with pmh of TBI, vertebral compression fractures, spinal stenosis, back pain, weakness, ataxia, COPD, GERD, Skin cancer, Hep C (post treatment), urinary/stool incontinence, Pt brought in by EMS due to concern for decreased motor planning abilities/coordination and decreased independence in mobility and functional transfers. CT scan showed no acute abnormality.     PT Comments    Pt seen for mobility progression and requesting to transfer from recliner chair to [facility] for BM. Pt's wife present in room throughout and attempting to assist with transfer. Pt continues to demonstrate significant deficits in motor planning and coordination, limiting his functional mobility. PT strongly recommending pt receive further intensive therapy services prior to returning home in the Inpatient Rehab setting. Pt would continue to benefit from skilled physical therapy services at this time while admitted and after d/c to address the below listed limitations in order to improve overall safety and independence with functional mobility.    Follow Up Recommendations  CIR     Equipment Recommendations  None recommended by PT    Recommendations for Other Services       Precautions / Restrictions Precautions Precautions: Fall;Other (comment) (monitoring for seizures) Restrictions Weight Bearing Restrictions: No    Mobility  Bed Mobility               General bed mobility comments: pt OOB in recliner chair upon arrival  Transfers Overall transfer level: Needs assistance Equipment used: Rolling walker (2 wheeled) (Stedy) Transfers: Sit to/from Stand Sit to Stand: Mod assist;+2 physical assistance;+2 safety/equipment         General transfer comment: increased time, verbal and tactile cueing for sequencing and motor planning,  assist to rise from recliner chair x1 with RW and x1 with Stedy; pt with significant posterior lean upon standing  Ambulation/Gait                 Stairs            Wheelchair Mobility    Modified Rankin (Stroke Patients Only)       Balance Overall balance assessment: Needs assistance Sitting-balance support: Bilateral upper extremity supported;Feet supported Sitting balance-Leahy Scale: Poor   Postural control: Posterior lean Standing balance support: Bilateral upper extremity supported Standing balance-Leahy Scale: Poor Standing balance comment: reliant on external supports and mod A x2                            Cognition Arousal/Alertness: Awake/alert Behavior During Therapy: Flat affect Overall Cognitive Status: Impaired/Different from baseline Area of Impairment: Problem solving                             Problem Solving: Slow processing;Decreased initiation;Difficulty sequencing;Requires verbal cues;Requires tactile cues General Comments: Pt has cognitive deficits at baseline, and has demonstrated suddent deficits in motor planning specifically which impact his ability to assist in ADL and are affecting his quality of life.      Exercises      General Comments        Pertinent Vitals/Pain Pain Assessment: Faces Faces Pain Scale: No hurt    Home Living                      Prior Function  PT Goals (current goals can now be found in the care plan section) Acute Rehab PT Goals PT Goal Formulation: With patient/family Time For Goal Achievement: 12/24/16 Potential to Achieve Goals: Good Progress towards PT goals: Progressing toward goals    Frequency    Min 4X/week      PT Plan Current plan remains appropriate    Co-evaluation              AM-PAC PT "6 Clicks" Daily Activity  Outcome Measure  Difficulty turning over in bed (including adjusting bedclothes, sheets and blankets)?:  Unable Difficulty moving from lying on back to sitting on the side of the bed? : Unable Difficulty sitting down on and standing up from a chair with arms (e.g., wheelchair, bedside commode, etc,.)?: Unable Help needed moving to and from a bed to chair (including a wheelchair)?: A Lot Help needed walking in hospital room?: Total Help needed climbing 3-5 steps with a railing? : Total 6 Click Score: 7    End of Session Equipment Utilized During Treatment: Gait belt Activity Tolerance: Patient tolerated treatment well Patient left: with call bell/phone within reach;with family/visitor present;Other (comment) (on [facility]) Nurse Communication: Mobility status PT Visit Diagnosis: Unsteadiness on feet (R26.81);Other abnormalities of gait and mobility (R26.89);Other symptoms and signs involving the nervous system (R29.898);Difficulty in walking, not elsewhere classified (R26.2)     Time: 6759-1638 PT Time Calculation (min) (ACUTE ONLY): 16 min  Charges:  $Therapeutic Activity: 8-22 mins                    G Codes:       [place], [place], [place] 12/11/2016, 10:03 AM

## 2016-12-11 NOTE — Care Management (Addendum)
Paged MD for orders for home health RN,PT,OT,SE and aide and face to face. See previous CM note.   Updated ambulance transport forms and placed on chart.  Met with patient's wife at bedside.   Update [name] wife insists patient will stay at [facility] until placement found. Explained patient needs medical reason to stay at hospital . Discussed ABN which was given yesterday to wife. Insurance will not cover continued hospital stay and patient Regis Bill will be responsible. Ms Nolon Rod voiced understanding. Stated she spoke with someone at [place] this am and her husband's paperwork will be reviewed by [place] today at 1 pm. She was advised by [facility] to have [facility] SW check to see if [facility] has bed available. Kathlee Nations with SW following up.     Magdalen Spatz RN BSN [phone]

## 2016-12-11 NOTE — Telephone Encounter (Signed)
I unfortunately am not on the inpatient service. I spoke with the team about his care yesterday and will touch base again today but I am not able to keep him in the hospital.

## 2016-12-11 NOTE — Progress Notes (Signed)
   Subjective: Todd [name] has no specific complaints this morning. His wife reports he may have had some mild confusion earlier in the morning. After PT/OT evaluation yesterday and further communication with them, as well as our evaluation of the pt, he is not safe to discharge home with the level of care available there. He is awaiting evaluation for VA benefits for SNF placement.   Objective:  Vital signs in last 24 hours: Vitals:   12/11/16 0452 12/11/16 0923 12/11/16 1057 12/11/16 1423  BP: 117/68  124/78 93/67  Pulse: 83  89 98  Resp: 18  17 16   Temp: 98.6 F (37 C)  98.8 F (37.1 C) 97.7 F (36.5 C)  TempSrc: Oral  Axillary Oral  SpO2: 99% 93% 97% 95%  Weight:      Height:       Physical Exam  Constitutional: No distress.  Cardiovascular: Normal rate and regular rhythm.   Pulmonary/Chest: Effort normal. No respiratory distress.  Abdominal: Soft. There is no tenderness.  Neurological:  5/5 proximal and distal strength in extremities, no focal neurologic deficit   Skin: Skin is warm and dry. He is not diaphoretic.     Assessment/Plan:  Weakness  Pt does not have acute focal neuro deficits, good strength on exam today. He does have a history of spinal stenosis recently treated with epidural steroid injection which provided relief, though exam and report of general weakness does not support this issue causing deficits. 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 His wife is concerned for seizures causing his decline, however he has not had any witnessed events. PT/OT evaluation strongly recommended inpatient rehab based on his functional status, however insurance approval has been an issue in the past. He may be eligible for SNF placement via VA benefits  --PT/OT --SW for placement --Up out of bed with assistance    Dispo: Anticipated discharge in approximately 0-1 day(s).    Tawny Asal, MD 12/11/2016, 4:43 PM Pager: [phone]

## 2016-12-11 NOTE — Telephone Encounter (Signed)
Wife calling stating patient still in [facility]M17 under observation & were told yesterday of a likely d/c today. Concerned she can not take him home. Patient needs placement(awaiting response from sw). She wants to know if PCP can help patient to stay @ hospital until placement? Or figure out a better plan. Thanks!

## 2016-12-11 NOTE — Progress Notes (Signed)
Internal Medicine Attending:   I saw and examined the patient. I reviewed the resident's note and I agree with the resident's findings and plan as documented in the resident's note.  71 year old man who has a poor functional state because of a prior TBI and now with progressive dementia. He was admitted with weakness, workup so far has been reassuring with no signs of infection, metabolic derangements, medication side effects, or other reversible cause of his weakness. However he still continues to be too weak to discharge to home, according to physical therapy. Unfortunately does have to walk up a flight of stairs to get into his apartment, and currently he is unable to do this. We are awaiting skilled nursing facility placement, he is VA connected, hopefully this can be accomplished over the next few days.

## 2016-12-11 NOTE — Progress Notes (Addendum)
CSW worked with patient's spouse throughout the day today to facilitate discharge planning process.   CSW received request this morning from patient's wife to contact [facility] to request bed availability, in hopes that the [place] would approve a SNF contract for the patient. CSW contacted [facility] to send referral and check on a bed offer; patient was refused.   CSW explained to patient's wife that patient was not accepted at [facility], and patient's wife was upset and wanted to contact the facility herself. [name] wife requested that CSW contact again to see if there was anything that could be done to change their mind. CSW contacted facility a second time, but they do not have any beds available and do not know when they will have beds available in the future. CSW provided information to patient's wife.  CSW received approval from the [place] for a SNF contract for placement for 32 days. CSW provided information to patient's wife that he had received approval for placement from the [place].  CSW reached out to patient's [place] in [place] to determine other facilities available. CSW received information on all available contracted facilities within [place]. CSW provided information to patient's wife, who was not satisfied with the options available and the distance that she would have to travel in order to see the patient during his SNF stay. [name] wife refused having the referral sent anywhere else until she did research on the options that she had available.  [name] wife was agreeable to send a referral to [facility] in [place]. CSW sent referral out, and was hopeful to receive a call back on a bed offer by 5:00 PM. Facility has not called with a bed offer. Patient currently has no bed offers available. [facility] will not be able to call back with bed offer until Monday morning. [name] wife is aware that she will be receiving the bill as long as the patient is in the hospital, and she has indicated  that she cannot bring him home while she is awaiting placement.  CSW will continue to follow to assist in discharge planning, and will update when patient has a bed offer for placement.  Laveda Abbe, [place] Clinical Social Worker [phone]

## 2016-12-11 NOTE — Progress Notes (Signed)
Physical Therapy Treatment Patient Details Name: Todd [name] MRN: [id number] DOB: 1945/06/20 Today's Date: 12/11/2016    History of Present Illness Pt is a 71 yo male with pmh of TBI, vertebral compression fractures, spinal stenosis, back pain, weakness, ataxia, COPD, GERD, Skin cancer, Hep C (post treatment), urinary/stool incontinence, Pt brought in by EMS due to concern for decreased motor planning abilities/coordination and decreased independence in mobility and functional transfers. CT scan showed no acute abnormality.     PT Comments    Pt seen at the request of wife to assist with returning pt back to recliner chair from [facility].  Pt continues to demonstrate significant deficits in motor planning and coordination, limiting his functional mobility. PT strongly recommending pt receive further intensive therapy services prior to returning home in the Inpatient Rehab setting. Pt would continue to benefit from skilled physical therapy services at this time while admitted and after d/c to address the below listed limitations in order to improve overall safety and independence with functional mobility.  Follow Up Recommendations  CIR     Equipment Recommendations  None recommended by PT    Recommendations for Other Services       Precautions / Restrictions Precautions Precautions: Fall;Other (comment) (monitoring for seizures) Restrictions Weight Bearing Restrictions: No    Mobility  Bed Mobility               General bed mobility comments: pt OOB on BSC upon arrival  Transfers Overall transfer level: Needs assistance Equipment used: 2 person hand held assist (STEDY) Transfers: Sit to/from Stand Sit to Stand: Mod assist;+2 physical assistance;+2 safety/equipment         General transfer comment: increased time, verbal and tactile cueing for sequencing and motor planning, assist to rise from [name] x1 with Stedy and from recliner chair x1 with [place]; pt with significant  posterior lean upon standing  Ambulation/Gait                 Stairs            Wheelchair Mobility    Modified Rankin (Stroke Patients Only)       Balance Overall balance assessment: Needs assistance Sitting-balance support: Bilateral upper extremity supported;Feet supported Sitting balance-Leahy Scale: Poor   Postural control: Posterior lean Standing balance support: Bilateral upper extremity supported Standing balance-Leahy Scale: Poor Standing balance comment: reliant on external supports and mod A x2                            Cognition Arousal/Alertness: Awake/alert Behavior During Therapy: Flat affect Overall Cognitive Status: Impaired/Different from baseline Area of Impairment: Problem solving                             Problem Solving: Slow processing;Decreased initiation;Difficulty sequencing;Requires verbal cues;Requires tactile cues General Comments: Pt has cognitive deficits at baseline, and has demonstrated suddent deficits in motor planning specifically which impact his ability to assist in ADL and are affecting his quality of life.      Exercises      General Comments        Pertinent Vitals/Pain Pain Assessment: Faces Faces Pain Scale: Hurts little more Pain Location: generalized Pain Descriptors / Indicators: Moaning Pain Intervention(s): Monitored during session;Repositioned    Home Living  Prior Function            PT Goals (current goals can now be found in the care plan section) Acute Rehab PT Goals PT Goal Formulation: With patient/family Time For Goal Achievement: 12/24/16 Potential to Achieve Goals: Good Progress towards PT goals: Progressing toward goals    Frequency    Min 4X/week      PT Plan Current plan remains appropriate    Co-evaluation              AM-PAC PT "6 Clicks" Daily Activity  Outcome Measure  Difficulty turning over in bed  (including adjusting bedclothes, sheets and blankets)?: Unable Difficulty moving from lying on back to sitting on the side of the bed? : Unable Difficulty sitting down on and standing up from a chair with arms (e.g., wheelchair, bedside commode, etc,.)?: Unable Help needed moving to and from a bed to chair (including a wheelchair)?: A Lot Help needed walking in hospital room?: Total Help needed climbing 3-5 steps with a railing? : Total 6 Click Score: 7    End of Session Equipment Utilized During Treatment: Gait belt Activity Tolerance: Patient tolerated treatment well Patient left: in chair;with call bell/phone within reach;with family/visitor present Nurse Communication: Mobility status PT Visit Diagnosis: Unsteadiness on feet (R26.81);Other abnormalities of gait and mobility (R26.89);Other symptoms and signs involving the nervous system (R29.898);Difficulty in walking, not elsewhere classified (R26.2)     Time: 2257-5051 PT Time Calculation (min) (ACUTE ONLY): 17 min  Charges:  $Therapeutic Activity: 8-22 mins                    G Codes:       [place], [place], [place] 12/11/2016, 10:42 AM

## 2016-12-12 DIAGNOSIS — S06890S Other specified intracranial injury without loss of consciousness, sequela: Secondary | ICD-10-CM

## 2016-12-12 DIAGNOSIS — X58XXXS Exposure to other specified factors, sequela: Secondary | ICD-10-CM | POA: Diagnosis not present

## 2016-12-12 DIAGNOSIS — R531 Weakness: Secondary | ICD-10-CM | POA: Diagnosis not present

## 2016-12-12 DIAGNOSIS — M4800 Spinal stenosis, site unspecified: Secondary | ICD-10-CM

## 2016-12-12 LAB — LAMOTRIGINE LEVEL: LAMOTRIGINE LVL: 2.8 ug/mL (ref 2.0–20.0)

## 2016-12-12 NOTE — Progress Notes (Signed)
Internal Medicine Attending  Date: 12/12/2016  Patient name: Todd [name] Medical record number: [id number] Date of birth: [birth year] Age: 71 y.o. Gender: male  I saw and evaluated the patient. I reviewed the resident's note by Dr. Juleen China and I agree with the resident's findings and plans as documented in his progress note.  When seen on rounds this morning Mr. Brunetto was comfortably lying in bed without apparent complaint. We are continuing current supportive care awaiting a skilled nursing facility placement via his VA benefits.

## 2016-12-12 NOTE — Progress Notes (Signed)
   Subjective: Mr. Bernasconi has no specific complaints this morning.  There is no family present in the room.  There were no acute events noted overnight.  Objective:  Vital signs in last 24 hours: Vitals:   12/11/16 2027 12/12/16 0518 12/12/16 0826 12/12/16 1015  BP: 113/71 118/64  121/80  Pulse: 99 93  67  Resp: 20 20  20   Temp: 98.6 F (37 C) 98.3 F (36.8 C)  98.8 F (37.1 C)  TempSrc: Axillary Axillary  Oral  SpO2: 98% 99% 98% 99%  Weight:      Height:       Physical Exam  Constitutional: No distress.  Lying in bed, watching TV.  Cardiovascular: Normal rate and regular rhythm.   Pulmonary/Chest: Effort normal. No respiratory distress.  Neurological:  There is no focal neurologic deficit   Skin: Skin is warm and dry. He is not diaphoretic.     Assessment/Plan:  Weakness  Pt does not have acute focal neuro deficits and has had good strength on exam. He does have a history of spinal stenosis recently treated with epidural steroid injection which provided relief, though exam and report of general weakness does not support this issue causing deficits. 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 PT/OT evaluation strongly recommended inpatient rehab based on his functional status, however insurance approval has been an issue in the past. He may be eligible for SNF placement via VA benefits and his disposition is pending this.  He currently is unable to return to his home. --PT/OT --SW for placement --Up out of bed with assistance    Dispo: Anticipated discharge in approximately 0-1 day(s).   [name], DO 12/12/2016, 12:16 PM

## 2016-12-13 NOTE — Progress Notes (Signed)
Patient monitored during evening meal.  Noted to be coughing and drooling during meal.  Meal stopped, NPO status initiated.  Lung sounds clear bilaterally, afebrile.

## 2016-12-13 NOTE — Progress Notes (Signed)
   Subjective: Todd [name] is sleeping comfortably this morning, easily woken, no specific complaints verbalized. His wife is not present in the room during evaluation.   Objective:  Vital signs in last 24 hours: Vitals:   12/12/16 1824 12/12/16 2128 12/13/16 0140 12/13/16 0613  BP: 109/71 114/72 115/73 121/80  Pulse: 97 77 72 68  Resp: 20 20 20 20   Temp: (!) 97.5 F (36.4 C) 97.8 F (36.6 C) (!) 97.4 F (36.3 C) 97.7 F (36.5 C)  TempSrc: Oral Oral Oral Oral  SpO2: 97% 97% 95% 97%  Weight:      Height:       Physical Exam  Constitutional: No distress.  Resting comfortably in bed   Cardiovascular: Normal rate and regular rhythm.   Pulmonary/Chest: Effort normal. No respiratory distress.  Neurological:  There is no focal neurologic deficit   Skin: Skin is warm and dry. He is not diaphoretic.     Assessment/Plan:  Weakness  Pt does not have acute focal neuro deficits and has had good strength on exam. He does have a history of spinal stenosis recently treated with epidural steroid injection which provided relief, though exam and report of general weakness does not support this issue causing deficits. 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 PT/OT evaluation strongly recommended inpatient rehab based on his functional status, however insurance approval has been an issue in the past. He may be eligible for SNF placement via VA benefits and his disposition is pending this.  He currently is unable to return to his home. --PT/OT --SW for placement --Up out of bed with assistance    Dispo: Anticipated discharge in approximately 1-2 day(s).   Tawny Asal, MD 12/13/2016, 8:34 AM

## 2016-12-14 DIAGNOSIS — Z87820 Personal history of traumatic brain injury: Secondary | ICD-10-CM | POA: Diagnosis not present

## 2016-12-14 DIAGNOSIS — R531 Weakness: Secondary | ICD-10-CM | POA: Diagnosis not present

## 2016-12-14 DIAGNOSIS — R269 Unspecified abnormalities of gait and mobility: Secondary | ICD-10-CM | POA: Diagnosis not present

## 2016-12-14 DIAGNOSIS — Z886 Allergy status to analgesic agent status: Secondary | ICD-10-CM | POA: Diagnosis not present

## 2016-12-14 NOTE — Progress Notes (Signed)
Physical Therapy Treatment Patient Details Name: Todd [name] MRN: [id number] DOB: [birth year] Today's Date: 12/14/2016    History of Present Illness Pt is a 71 yo male with pmh of TBI, vertebral compression fractures, spinal stenosis, back pain, weakness, ataxia, COPD, GERD, Skin cancer, Hep C (post treatment), urinary/stool incontinence, Pt brought in by EMS due to concern for decreased motor planning abilities/coordination and decreased independence in mobility and functional transfers. CT scan showed no acute abnormality.     PT Comments    Wife present and very involved in session. Pt is showing progress, as he ambulated today. Mod A +2 for ambulation. Plan to try ambulating with BIL platform walker next session to improve pt's postural control. Pt should benefit from continued skilled PT to increase functional independence. Will continue to follow acutely.    Follow Up Recommendations  CIR     Equipment Recommendations  None recommended by PT    Recommendations for Other Services Rehab consult     Precautions / Restrictions Precautions Precautions: Fall;Other (comment) (monitoring for seizures) Restrictions Weight Bearing Restrictions: No    Mobility  Bed Mobility Overal bed mobility: Needs Assistance Bed Mobility: Supine to Sit     Supine to sit: HOB elevated;Mod assist     General bed mobility comments: min A to progress LE off EOB and elevate trunk. Mod A to scoot hips forward to EOB.  Transfers Overall transfer level: Needs assistance Equipment used: Rolling walker (2 wheeled) Transfers: Sit to/from Stand Sit to Stand: Mod assist;+2 physical assistance;+2 safety/equipment         General transfer comment: increased time, verbal and tactile cueing for sequencing and motor planning, assist to rise from [facility] x1 with [place] and from recliner chair x1 with [place]; pt with significant posterior lean upon standing  Ambulation/Gait Ambulation/Gait assistance:  Mod assist;+2 physical assistance;+2 safety/equipment Ambulation Distance (Feet): 30 Feet Assistive device: Rolling walker (2 wheeled) Gait Pattern/deviations: Step-to pattern;Step-through pattern;Decreased step length - right;Decreased step length - left;Decreased stride length;Shuffle;Ataxic;Trunk flexed;Leaning posteriorly Gait velocity: decreased Gait velocity interpretation: Below normal speed for age/gender General Gait Details: Mod A +2 to ambualte. Physical assist for walker management and posterior lean. Cueing and physical assist to progress LEs secondary to poor motor control. Pt responds well to "marching" to pick up feet.    Stairs            Wheelchair Mobility    Modified Rankin (Stroke Patients Only)       Balance Overall balance assessment: Needs assistance Sitting-balance support: Bilateral upper extremity supported;Feet supported Sitting balance-Leahy Scale: Poor   Postural control: Posterior lean Standing balance support: Bilateral upper extremity supported Standing balance-Leahy Scale: Poor Standing balance comment: reliant on external supports and mod A x2                            Cognition Arousal/Alertness: Awake/alert Behavior During Therapy: Impulsive;WFL for tasks assessed/performed Overall Cognitive Status: Impaired/Different from baseline Area of Impairment: Problem solving                             Problem Solving: Slow processing;Decreased initiation;Difficulty sequencing;Requires verbal cues;Requires tactile cues General Comments: Pt has cognitive deficits at baseline, and has demonstrated suddent deficits in motor planning specifically which impact his ability to ambulate and are affecting his quality of life      Exercises      General Comments General  comments (skin integrity, edema, etc.): Pt's wife involved in session and very active and capable       Pertinent Vitals/Pain Pain Assessment: 0-10 Pain  Score: 5  Pain Location: generalized Pain Descriptors / Indicators: Moaning Pain Intervention(s): Monitored during session;Limited activity within patient's tolerance;Repositioned    Home Living                      Prior Function            PT Goals (current goals can now be found in the care plan section) Acute Rehab PT Goals Patient Stated Goal: to get to CIR to get stronger and back to PLOF PT Goal Formulation: With patient/family Time For Goal Achievement: 12/24/16 Potential to Achieve Goals: Good Progress towards PT goals: Progressing toward goals    Frequency    Min 4X/week      PT Plan Current plan remains appropriate    Co-evaluation              AM-PAC PT "6 Clicks" Daily Activity  Outcome Measure  Difficulty turning over in bed (including adjusting bedclothes, sheets and blankets)?: Unable Difficulty moving from lying on back to sitting on the side of the bed? : Unable Difficulty sitting down on and standing up from a chair with arms (e.g., wheelchair, bedside commode, etc,.)?: Unable Help needed moving to and from a bed to chair (including a wheelchair)?: A Lot Help needed walking in hospital room?: A Lot Help needed climbing 3-5 steps with a railing? : Total 6 Click Score: 8    End of Session Equipment Utilized During Treatment: Gait belt Activity Tolerance: Patient tolerated treatment well Patient left: in chair;with call bell/phone within reach;with chair alarm set;with family/visitor present Nurse Communication: Mobility status PT Visit Diagnosis: Unsteadiness on feet (R26.81);Other abnormalities of gait and mobility (R26.89);Other symptoms and signs involving the nervous system (R29.898);Difficulty in walking, not elsewhere classified (R26.2)     Time: 1030-1314 PT Time Calculation (min) (ACUTE ONLY): 30 min  Charges:  $Gait Training: 23-37 mins                    G Codes:       Benjiman Core, [place] Pager [phone] Acute  Rehab   Allena Katz 12/14/2016, 3:11 PM

## 2016-12-14 NOTE — Progress Notes (Signed)
SLP Cancellation Note  Patient Details Name: Todd [name] MRN: [id number] DOB: 05-25-1945   Cancelled treatment:       Reason Eval/Treat Not Completed: SLP screened, no needs identified, will sign off. Pt seen on 12/10/16 by SLP this admit. No acute changes in function. Please see note from that date. Will sign off at this time.  Herbie [place], [place] CCC-SLP [phone]    Lynann Beaver 12/14/2016, 7:47 AM

## 2016-12-14 NOTE — Progress Notes (Signed)
Internal Medicine Attending:   I saw and examined the patient. I reviewed the resident's note and I agree with the resident's findings and plan as documented in the resident's note.  71 year old man with declining functional status due to prior TBI awaiting SNF placement and remains too limited to return home safely. We appreciate CSW work in finding safe placement.

## 2016-12-14 NOTE — Progress Notes (Signed)
CSW following to facilitate discharge planning. CSW placed multiple calls throughout the day to [facility] admissions in order to get information on a bed offer for patient, they have denied. CSW has also faxed out referral to [facility] [place], and [facility]; waiting to hear back on whether or not facility can accept patient.  CSW informed patient's wife that patient received a denial from [facility]. CSW explained to patient's wife that he still does not have a bed offer anywhere. [name] wife is still not interested in sending the patient further away for SNF, but CSW explained that the facility options were limited due to [place] contracts.   CSW will follow up with any bed offers and facilitate discharge to SNF.  Laveda Abbe, [place] Clinical Social Worker [phone]

## 2016-12-14 NOTE — Progress Notes (Signed)
   Subjective: He was noted to have coughing with a meal overnight and was made NPO. This morning he has no acute changes to his condition, SLP re-evaluated and no further interventions necessary and can continue on his prior dysphagia diet without changes. His significant is continuing to work with SW to find a SNF compatible with his VA benefits.   Objective:  Vital signs in last 24 hours: Vitals:   12/14/16 0136 12/14/16 0635 12/14/16 0901 12/14/16 1016  BP: 126/66 131/79  133/75  Pulse: (!) 56 (!) 59  (!) 56  Resp: 18 18  18   Temp: 97.8 F (36.6 C) 98.2 F (36.8 C)    TempSrc: Oral Oral  Oral  SpO2: 100% 99% 99% 99%  Weight:      Height:       Physical Exam  Constitutional: No distress.  Resting comfortably in bed   Cardiovascular: Normal rate.   Pulmonary/Chest: Effort normal. No respiratory distress.  Neurological:  There is no focal neurologic deficit   Skin: Skin is warm and dry. He is not diaphoretic.     Assessment/Plan:  Weakness  Pt does not have acute focal neuro deficits and has had good strength on exam. He does have a history of spinal stenosis recently treated with epidural steroid injection which provided relief, though exam and report of general weakness does not support this issue causing deficits. 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 PT/OT evaluation strongly recommended inpatient rehab based on his functional status, however insurance approval has been an issue in the past. He may be eligible for SNF placement via VA benefits and his disposition is pending this.  He currently is unable to return to his home safely. --PT/OT --SW for placement --Up out of bed with assistance    Dispo: Anticipated discharge in approximately 1-2 day(s) pending placement.   Tawny Asal, MD 12/14/2016, 11:27 AM

## 2016-12-15 DIAGNOSIS — Z886 Allergy status to analgesic agent status: Secondary | ICD-10-CM | POA: Diagnosis not present

## 2016-12-15 DIAGNOSIS — R269 Unspecified abnormalities of gait and mobility: Secondary | ICD-10-CM | POA: Diagnosis not present

## 2016-12-15 DIAGNOSIS — R531 Weakness: Secondary | ICD-10-CM | POA: Diagnosis not present

## 2016-12-15 DIAGNOSIS — Z87820 Personal history of traumatic brain injury: Secondary | ICD-10-CM | POA: Diagnosis not present

## 2016-12-15 NOTE — Progress Notes (Signed)
   Subjective: No acute events overnight, notes slight headache this morning. Disposition continues to be difficult as a limited number of facilities are available via his VA benefits, he has been denied a bed at multiple facilities, and other facilities are geographically difficult for his significant other. He is continuing to work well with PT and OT during this admission.     Objective:  Vital signs in last 24 hours: Vitals:   12/14/16 1824 12/14/16 2100 12/15/16 0131 12/15/16 0514  BP: 117/73 117/75 138/84 133/90  Pulse: 90 84 73 72  Resp: 18  18 18   Temp: 98.6 F (37 C) 97.7 F (36.5 C) 97.7 F (36.5 C) 98.7 F (37.1 C)  TempSrc: Oral Oral Oral Oral  SpO2: 99% 96% 98% 95%  Weight:      Height:       Physical Exam  Constitutional: No distress.  Sitting in chair eating breakfast in no acute distress   Cardiovascular: Normal rate.   Pulmonary/Chest: Effort normal. No respiratory distress.  Neurological:  There is no focal neurologic deficit   Skin: Skin is warm and dry. He is not diaphoretic.    Assessment/Plan:  Weakness  Pt does not have acute focal neuro deficits and has had good strength on exam. He does have a history of spinal stenosis recently treated with epidural steroid injection which provided relief, though exam and report of general weakness does not support this issue causing deficits. 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 PT/OT evaluation strongly recommended inpatient rehab based on his functional status, however insurance approval has been an issue in the past. He may be eligible for SNF placement via VA benefits and his disposition is pending this.  He currently is unable to return to his home safely. --PT/OT --SW for placement --Up out of bed with assistance    Dispo: Anticipated discharge in approximately 1-2 day(s) pending placement.   Tawny Asal,  MD 12/15/2016, 7:39 AM

## 2016-12-15 NOTE — Care Management Note (Signed)
Case Management Note  Patient Details  Name: Todd [name] MRN: [id number] Date of Birth: 1946/04/16  Subjective/Objective:                    Action/Plan: Awaiting VA SNF placement. CM following.   Expected Discharge Date:                  Expected Discharge Plan:  [place]  In-House Referral:  Clinical Social Work  Discharge planning Services  CM Consult  Post Acute Care Choice:    Choice offered to:     DME Arranged:    DME Agency:     HH Arranged:    [place] Agency:     Status of Service:  In process, will continue to follow  If discussed at Long Length of Stay Meetings, dates discussed:    Additional Comments:  Pollie Friar, RN 12/15/2016, 1:26 PM

## 2016-12-15 NOTE — Progress Notes (Signed)
Physical Therapy Treatment Patient Details Name: Todd [name] MRN: [id number] DOB: Nov 09, 1945 Today's Date: 12/15/2016    History of Present Illness Pt is a 71 yo male with pmh of TBI, vertebral compression fractures, spinal stenosis, back pain, weakness, ataxia, COPD, GERD, Skin cancer, Hep C (post treatment), urinary/stool incontinence, Pt brought in by EMS due to concern for decreased motor planning abilities/coordination and decreased independence in mobility and functional transfers. CT scan showed no acute abnormality.     PT Comments    Pt progressing steadily.  Emphasized gait with 2 platform RW.  This allow the pt to stand fully upright, engage with the environment instead of staring at the floor   Follow Up Recommendations  SNF     Equipment Recommendations  None recommended by PT    Recommendations for Other Services       Precautions / Restrictions Precautions Precautions: Fall    Mobility  Bed Mobility               General bed mobility comments: up in the chair on arrival  Transfers Overall transfer level: Needs assistance Equipment used: Left platform walker;Right platform walker (Bil platform RW) Transfers: Sit to/from Stand Sit to Stand: Mod assist;+2 safety/equipment         General transfer comment: time spent scooting symmetrically/assymetrically without success, pt not able to release quads. eccentrically  Ambulation/Gait Ambulation/Gait assistance: Mod assist;+2 safety/equipment Ambulation Distance (Feet): 70 Feet (then additional 60 feet after rest) Assistive device: Right platform walker;Left platform walker Gait Pattern/deviations: Step-to pattern;Step-through pattern;Decreased step length - right;Decreased step length - left   Gait velocity interpretation: Below normal speed for age/gender General Gait Details: variable short, low amplitude steps.  pt not staying up close the the RW, but with high set platforms, pt able to stay  upright and look forward instead of at the ground.  Physical and VC's need for postural checks and to keep him moving.   Stairs            Wheelchair Mobility    Modified Rankin (Stroke Patients Only)       Balance Overall balance assessment: Needs assistance Sitting-balance support: Bilateral upper extremity supported Sitting balance-Leahy Scale: Poor Sitting balance - Comments: Reliant on at least single UE support or min assist to maintain balance without back support.    Standing balance support: Bilateral upper extremity supported Standing balance-Leahy Scale: Poor Standing balance comment: reliant on external supports and mod A x2                            Cognition Arousal/Alertness: Awake/alert Behavior During Therapy: WFL for tasks assessed/performed Overall Cognitive Status: Impaired/Different from baseline Area of Impairment: Awareness;Problem solving                           Awareness: Intellectual   General Comments: Slowed processing overall, unable to divide attension to task.      Exercises      General Comments        Pertinent Vitals/Pain Pain Assessment: Faces Faces Pain Scale: No hurt    Home Living                      Prior Function            PT Goals (current goals can now be found in the care plan section) Acute Rehab PT Goals Patient  Stated Goal: to get to CIR to get stronger and back to PLOF PT Goal Formulation: With patient/family Time For Goal Achievement: 12/24/16 Potential to Achieve Goals: Good    Frequency    Min 4X/week      PT Plan Current plan remains appropriate    Co-evaluation              AM-PAC PT "6 Clicks" Daily Activity  Outcome Measure  Difficulty turning over in bed (including adjusting bedclothes, sheets and blankets)?: Unable Difficulty moving from lying on back to sitting on the side of the bed? : Unable Difficulty sitting down on and standing up  from a chair with arms (e.g., wheelchair, bedside commode, etc,.)?: Unable Help needed moving to and from a bed to chair (including a wheelchair)?: A Lot Help needed walking in hospital room?: A Lot Help needed climbing 3-5 steps with a railing? : Total 6 Click Score: 8    End of Session   Activity Tolerance: Patient tolerated treatment well Patient left: in chair;with call bell/phone within reach;with chair alarm set;with family/visitor present Nurse Communication: Mobility status PT Visit Diagnosis: Unsteadiness on feet (R26.81);Other abnormalities of gait and mobility (R26.89)     Time: 0177-9390 PT Time Calculation (min) (ACUTE ONLY): 32 min  Charges:  $Gait Training: 8-22 mins $Therapeutic Activity: 8-22 mins                    G Codes:  Functional Assessment Tool Used: AM-PAC 6 Clicks Basic Mobility    12/15/2016  Donnella Sham, PT [phone]  (pager)   Tessie Fass Saskia Simerson 12/15/2016, 4:32 PM

## 2016-12-15 NOTE — Progress Notes (Signed)
CSW following to facilitate discharge to SNF. CSW heard back this morning from [facility]; no beds available. CSW contacted [facility] and [facility]. [facility] also has no beds at this time. Waiting to hear back from [facility].  CSW will update with bed offer information when available.  Laveda Abbe, [place] Clinical Social Worker [phone]

## 2016-12-15 NOTE — Progress Notes (Signed)
Internal Medicine Attending:   I saw and examined the patient. I reviewed the resident's note and I agree with the resident's findings and plan as documented in the resident's note.  71 year old man with decreased functional status due to progressive decline of prior traumatic brain injury. He continues to require significant assistance. Worked with occupational therapy today and still has very decreased coordination and motor planning skills. Continues to be unsafe to discharge back to home and is awaiting skilled nursing facility placement through the [place].

## 2016-12-15 NOTE — Progress Notes (Signed)
Occupational Therapy Treatment Patient Details Name: Todd [name] MRN: [id number] DOB: [birth year] Today's Date: 12/15/2016    History of present illness Pt is a 71 yo male with pmh of TBI, vertebral compression fractures, spinal stenosis, back pain, weakness, ataxia, COPD, GERD, Skin cancer, Hep C (post treatment), urinary/stool incontinence, Pt brought in by EMS due to concern for decreased motor planning abilities/coordination and decreased independence in mobility and functional transfers. CT scan showed no acute abnormality.    OT comments  Pt demonstrating progress toward OT goals. Session focused on improved dynamic balance, core stability, and engagement in seated ADL tasks. He was able to complete grooming tasks with min assist in unsupported seated position. He additionally was able to assist with donning his shoes in seated position this date. Facilitated improved core stability with dynamic stabilization activities and improved coordination with functional reaching tasks in PNF patterns. Continue to note decreased coordination and motor planning skills. As session progressed, pt requiring increased time for processing and to respond to therapist. He additionally reported dizziness at end of session and OT notified RN. Continue to feel that pt would best benefit from CIR level therapies post-acute D/C; however, note unable to obtain insurance authorization.    Follow Up Recommendations  CIR    Equipment Recommendations  None recommended by OT    Recommendations for Other Services Rehab consult    Precautions / Restrictions Precautions Precautions: Fall;Other (comment) (monitoring for seizures) Restrictions Weight Bearing Restrictions: No       Mobility Bed Mobility               General bed mobility comments: OOB in chair on arrival.   Transfers                      Balance Overall balance assessment: Needs assistance Sitting-balance support:  Bilateral upper extremity supported;Feet supported Sitting balance-Leahy Scale: Poor Sitting balance - Comments: Reliant on at least single UE support or min assist to maintain balance without back support.                                    ADL either performed or assessed with clinical judgement   ADL Overall ADL's : Needs assistance/impaired     Grooming: Wash/dry face;Sitting;Minimal assistance Grooming Details (indicate cue type and reason): Min assist for balance sitting without back support. Noted decreased coordination and signs of ataxia. Poor motor planning.              Lower Body Dressing: Maximal assistance;Sit to/from stand Lower Body Dressing Details (indicate cue type and reason): Able to don shoes but requires significant assist for sit<>stand to pull up pants, etc.                General ADL Comments: Session focused on improved core stability, dynamic balance, and functional use of B UE. Decreased motor planning impacting seated reaching tasks.      Vision   Additional Comments: Potential depth perception deficits noted.    Perception     Praxis      Cognition Arousal/Alertness: Awake/alert Behavior During Therapy: Impulsive Overall Cognitive Status: Impaired/Different from baseline Area of Impairment: Awareness;Problem solving                           Awareness: Intellectual Problem Solving: Slow processing;Decreased initiation;Difficulty sequencing;Requires verbal cues;Requires tactile cues General  Comments: Significantly increased processing time and fluctuating response times.         Exercises Exercises: Other exercises Other Exercises Other Exercises: Facilitated improved core stability with rhythmic stabilization techniques in seated position.  Other Exercises: Facilitated improved functional reach to target with reaching tasks in PNF patterns.    Shoulder Instructions       General Comments Pt with  fluctuating response time and reported dizziness during session. Notified RN who is monitoring.     Pertinent Vitals/ Pain       Pain Assessment: Faces Faces Pain Scale: Hurts a little bit Pain Location: generalized Pain Descriptors / Indicators: Grimacing Pain Intervention(s): Monitored during session  Home Living                                          Prior Functioning/Environment              Frequency  Min 3X/week        Progress Toward Goals  OT Goals(current goals can now be found in the care plan section)  Progress towards OT goals: Progressing toward goals  Acute Rehab OT Goals Patient Stated Goal: to get to CIR to get stronger and back to PLOF OT Goal Formulation: With patient/family Time For Goal Achievement: 12/24/16 Potential to Achieve Goals: Good  Plan Discharge plan needs to be updated    Co-evaluation                 AM-PAC PT "6 Clicks" Daily Activity     Outcome Measure   Help from another person eating meals?: A Lot Help from another person taking care of personal grooming?: A Lot Help from another person toileting, which includes using toliet, bedpan, or urinal?: A Lot Help from another person bathing (including washing, rinsing, drying)?: A Lot Help from another person to put on and taking off regular upper body clothing?: A Lot Help from another person to put on and taking off regular lower body clothing?: A Lot 6 Click Score: 12    End of Session Equipment Utilized During Treatment: Gait belt  OT Visit Diagnosis: Unsteadiness on feet (R26.81);Other abnormalities of gait and mobility (R26.89);Ataxia, unspecified (R27.0)   Activity Tolerance Patient tolerated treatment well   Patient Left in chair;with call bell/phone within reach;with chair alarm set;with family/visitor present;with nursing/sitter in room   Nurse Communication Other (comment) (Dizziness)    Functional Assessment Tool Used: AM-PAC 6 Clicks  Daily Activity Functional Limitation: Self care Self Care Current Status (X3235): At least 60 percent but less than 80 percent impaired, limited or restricted Self Care Goal Status (T7322): At least 20 percent but less than 40 percent impaired, limited or restricted   Time: 1035-1100 OT Time Calculation (min): 25 min  Charges: OT G-codes **NOT FOR INPATIENT CLASS** Functional Assessment Tool Used: AM-PAC 6 Clicks Daily Activity Functional Limitation: Self care Self Care Current Status (G2542): At least 60 percent but less than 80 percent impaired, limited or restricted Self Care Goal Status (H0623): At least 20 percent but less than 40 percent impaired, limited or restricted OT General Charges $OT Visit: 1 Procedure OT Treatments $Self Care/Home Management : 8-22 mins $Therapeutic Exercise: 8-22 mins  Todd Herrlich, MS OTR/L  Pager: Todd [name] 12/15/2016, 12:28 PM

## 2016-12-16 ENCOUNTER — Ambulatory Visit: Payer: Medicare HMO | Admitting: Neurology

## 2016-12-16 DIAGNOSIS — R2689 Other abnormalities of gait and mobility: Secondary | ICD-10-CM | POA: Diagnosis not present

## 2016-12-16 DIAGNOSIS — Z886 Allergy status to analgesic agent status: Secondary | ICD-10-CM | POA: Diagnosis not present

## 2016-12-16 DIAGNOSIS — R269 Unspecified abnormalities of gait and mobility: Secondary | ICD-10-CM | POA: Diagnosis not present

## 2016-12-16 DIAGNOSIS — R531 Weakness: Secondary | ICD-10-CM | POA: Diagnosis not present

## 2016-12-16 DIAGNOSIS — R270 Ataxia, unspecified: Secondary | ICD-10-CM | POA: Diagnosis not present

## 2016-12-16 DIAGNOSIS — S0990XA Unspecified injury of head, initial encounter: Secondary | ICD-10-CM | POA: Diagnosis not present

## 2016-12-16 DIAGNOSIS — M4800 Spinal stenosis, site unspecified: Secondary | ICD-10-CM | POA: Diagnosis not present

## 2016-12-16 DIAGNOSIS — Z87820 Personal history of traumatic brain injury: Secondary | ICD-10-CM | POA: Diagnosis not present

## 2016-12-16 NOTE — Progress Notes (Signed)
CSW following to facilitate discharge planning. Patient still has no [facility] beds available today.   CSW received approval from [facility] for 2 week LOG placement. CSW faxed out referral; patient received bed offer at [facility] for 2 weeks. CSW provided bed offer information to patient's wife. [name] wife was unhappy with the rating that the facility had, and was not willing to place the patient at the facility. [name] wife indicated that the hospital would just have to keep the patient until a better facility was located. CSW indicated to patient's wife that it wasn't possible to keep him at the hospital when he had a rehab bed available that she was refusing. [name] wife became upset and walked away.  CSW alerted MD and RNCM that patient's wife was refusing SNF bed offer at this time, and would have to DC home with home health services. CSW signing off.  Laveda Abbe, LCSW Clinical Social Worker [phone]   67 5:10 PM:  CSW responded to call from patient's wife, requesting to discuss what she does moving forward to try to assist with locating a VA bed for the patient from home. CSW reminded patient's wife of the contact information for the facilities, and that she had faxed over updated clinical information this morning. CSW encouraged patient's wife to continue to contact them every day, and to have [facility] send over updated PT and OT notes from home health to facilitate admission to [facility] SNF when bed is available.  Laveda Abbe, [place] Clinical Social Worker [phone]

## 2016-12-16 NOTE — Progress Notes (Signed)
Discharge orders received.  Discharge instructions and follow-up appointments reviewed with the patient and his wife.  VSS upon discharge.  IV removed and education complete.  All belongings sent with the patient.  Transported via [facility].    Cori Razor, RN

## 2016-12-16 NOTE — Progress Notes (Signed)
Received call from Stevphen [place] that patient is refusing SNF facility and is requesting to return home with [facility] services Provided by [facility]; Santiago Glad with [facility] called and made aware of discharge home today, transportation is to be provided by [facility]; address verified by spouse. Spouse is very upset that we are unable to find a better SNF for the patient. CM talked to Attending MD; SNF placement is preferred but since pt/ spouse is refusing bed offer with LOG, second option is home with [facility] services. Mindi Slicker [facility] [phone]

## 2016-12-16 NOTE — Discharge Summary (Signed)
Name: Todd [name] MRN: [id number] DOB: December 13, 1945 71 y.o. PCP: Todd Contes, MD  Date of Admission: 12/09/2016 12:45 PM Date of Discharge: 12/16/2016 Attending Physician: Dr. Lalla Brothers  Discharge Diagnosis: Principal Problem:   Weakness Active Problems:   Personal history of traumatic brain injury   Abnormality of gait   Discharge Medications: Allergies as of 12/16/2016      Reactions   Acetaminophen Other (See Comments)   Pt is unable to take this medication due to liver disease.     Aleve [naproxen] Other (See Comments)   Pt is unable to take this medication due to liver disease.     Beta Adrenergic Blockers Other (See Comments)   Reaction:  Disorientation    Codeine Hives, Other (See Comments)   Reaction:  Inflammation   Metoprolol Other (See Comments)   Reaction:  Dizziness    Nsaids Other (See Comments)   Pt is unable to take this medication due to liver disease.        Medication List    TAKE these medications   albuterol 108 (90 Base) MCG/ACT inhaler Commonly known as:  PROAIR HFA inhale 2 puffs INTO THE LUNGS every 6 hours if needed for wheezing What changed:  how much to take  how to take this  when to take this  reasons to take this  additional instructions   alendronate 70 MG tablet Commonly known as:  FOSAMAX Take 1 tablet (70 mg total) by mouth once a week. Take with a full glass of water on an empty stomach. What changed:  when to take this  additional instructions   aspirin 81 MG chewable tablet Chew 81 mg by mouth daily.   atorvastatin 80 MG tablet Commonly known as:  LIPITOR Take 1 tablet (80 mg total) by mouth daily at 6 PM.   b complex vitamins tablet Take 1 tablet by mouth daily.   Biotin 1000 MCG tablet Take 1,000 mcg by mouth daily.   clopidogrel 75 MG tablet Commonly known as:  PLAVIX take 1 tablet by mouth every morning   Co-Enzyme Q-10 100 MG Caps Take 100 mg by mouth daily.   desonide 0.05 %  cream Commonly known as:  DESOWEN Apply 1 application topically 2 (two) times daily. Pt applies to hair line.   DULoxetine 30 MG capsule Commonly known as:  CYMBALTA Take 1 capsule (30 mg total) by mouth daily.   famotidine 20 MG tablet Commonly known as:  PEPCID Take 20 mg by mouth daily.   fluticasone 50 MCG/ACT nasal spray Commonly known as:  FLONASE instill 1 spray into each nostril once daily   guaifenesin 400 MG Tabs tablet Commonly known as:  HUMIBID E Take 400 mg by mouth 2 (two) times daily.   isosorbide mononitrate 30 MG 24 hr tablet Commonly known as:  IMDUR Take 1 tablet (30 mg total) by mouth daily.   ketotifen 0.025 % ophthalmic solution Commonly known as:  [employer] ANTIHISTAMINE EYE DROPS Place 1 drop into both eyes 2 (two) times daily. What changed:  when to take this  reasons to take this   lamoTRIgine 100 MG tablet Commonly known as:  LAMICTAL Take 1/2 tablet ( 50 mg) by mouth in the AM. Take 1 tablet (100 mg) by mouth in the PM. What changed:  how much to take  how to take this  when to take this  additional instructions   levocetirizine 5 MG tablet Commonly known as:  XYZAL Take 5 mg by  mouth every evening.   lidocaine 5 % Commonly known as:  LIDODERM Place 1 patch onto the skin daily. Pt applies to back.  Remove & Discard patch within 12 hours or as directed by MD   multivitamin with minerals Tabs tablet Take 1 tablet by mouth daily.   oxyCODONE 5 MG immediate release tablet Commonly known as:  Oxy IR/ROXICODONE Take 0.5 tablets (2.5 mg total) by mouth daily as needed for severe pain.   SPIRIVA RESPIMAT 2.5 MCG/ACT Aers Generic drug:  Tiotropium Bromide Monohydrate Inhale 2 puffs into the lungs daily.   vitamin C 1000 MG tablet Take 1,000 mg by mouth daily.   Vitamin D 2000 units tablet Take 2,000 Units by mouth daily.            Discharge Care Instructions        Start     Ordered   12/16/16 0000  Increase activity  slowly     12/16/16 1621   12/16/16 0000  Diet - low sodium heart healthy     12/16/16 1621   12/16/16 0000  Discharge instructions    Comments:  -We didn't make any changes to medications   12/16/16 1621      Disposition and follow-up:   Todd [name] was discharged from [facility] in Stable condition.  At the hospital follow up visit please address:  1.  -Continue working with PT/OT in available capacities to continue to improve functional status and mobility, prevent further deconditioning   2.  Labs / imaging needed at time of follow-up: None  3.  Pending labs/ test needing follow-up: None  Follow-up Appointments:   Hospital Course by problem list:   Generalized Deconditioning, History of TBI [name] presented with complaints of generalized weakness and decreased functional status compared to his baseline. He was previously discharged from an admission in early July and made progress in a rehab facility before returning home. Since then his significant other and caretaker noted that he has had further decline in his functional status causing difficulty in caring for him at home. He was admitted for further evaluation which found no focal neurologic deficits or weakness on exam. His presenting complaints were unlikely to be caused by his known spinal stenosis. It was felt general decline related to his TBI and cognitive impairment were the cause for his presentation. PT/OT evaluations recommended Inpatient Rehab, however, his insurance was not approved at last admission and would not be improved in this case. As it was not safe to discharge the patient home with his functional status, he was kept as an inpatient until safe placement could be found. Social work diligently worked to find a facility who would accept his [place] SNF benefit and was geographically acceptable for his significant other. After several denials from facilities, the hospital offered to  pay for a short term rehab stay with a letter of guarantee and the search was expanded. He was accepted at a facility in [place], but his caretaker declined. She felt he would be safer at home. The hospital fulfilled its obligation to find a safe placement where the patient could receive rehab, but his significant other opted for the patient to be discharged home with home health services. He was stable without complaints at time of discharge.   Discharge Vitals:   BP 133/72 (BP Location: Right Arm)   Pulse 87   Temp 98.3 F (36.8 C) (Oral)   Resp 17   Ht 5\' 11"  (1.803  m)   Wt 188 lb 0.8 oz (85.3 kg)   SpO2 95%   BMI 26.23 kg/m   Pertinent Labs, Studies, and Procedures:  None  Discharge Instructions: Discharge Instructions    Diet - low sodium heart healthy    Complete by:  As directed    Discharge instructions    Complete by:  As directed    -We didn't make any changes to medications   Increase activity slowly    Complete by:  As directed       Signed: Tawny Asal, MD 12/16/2016, 9:19 PM   Pager: [phone]

## 2016-12-16 NOTE — Progress Notes (Signed)
Internal Medicine Attending:   I saw and examined the patient. I reviewed the resident's note and I agree with the resident's findings and plan as documented in the resident's note.  71 year old man with prolonged hospital stay because of declining functional status due to late effects of a traumatic brain injury. He is medically stable, however remains to weak to safely go home. We are awaiting placement in outpatient rehabilitation center. PT continues to work with him daily and he is making some progress in his gait.

## 2016-12-16 NOTE — Progress Notes (Signed)
   Subjective: No acute events overnight with no specific complaints this morning. Continues to work with therapy while awaiting placement.  Objective:  Vital signs in last 24 hours: Vitals:   12/16/16 0019 12/16/16 0415 12/16/16 0841 12/16/16 0939  BP: 122/71 126/68  127/79  Pulse: 90 71  79  Resp: 16 17  16   Temp: 98.3 F (36.8 C) 98.4 F (36.9 C)  98.3 F (36.8 C)  TempSrc: Oral Oral  Oral  SpO2: 94% 95% 93% 94%  Weight:      Height:       Physical Exam  Constitutional: No distress.  Sitting in chair in no acute distress   Cardiovascular: Normal rate.   Pulmonary/Chest: Effort normal. No respiratory distress.  Neurological:  There is no focal neurologic deficit   Skin: Skin is warm and dry. He is not diaphoretic.    Assessment/Plan:  Weakness  Pt does not have acute focal neuro deficits and has had good strength on exam. He does have a history of spinal stenosis recently treated with epidural steroid injection which provided relief, though exam and report of general weakness does not support this issue causing deficits. 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 PT/OT evaluation strongly recommended inpatient rehab based on his functional status, however insurance approval has been an issue in the past. He may be eligible for SNF placement via VA benefits and his disposition is pending this.  He currently is unable to return to his home safely. --PT/OT --SW for placement --Up out of bed with assistance    Dispo: Anticipated discharge in approximately 1-2 day(s) pending placement.   Tawny Asal, MD 12/16/2016, 11:30 AM

## 2016-12-22 ENCOUNTER — Encounter: Payer: Medicare HMO | Admitting: Internal Medicine

## 2016-12-23 ENCOUNTER — Telehealth: Payer: Self-pay | Admitting: Internal Medicine

## 2016-12-23 NOTE — Telephone Encounter (Signed)
Rec'd call from [facility] requesting VO for [facility]. Please call back

## 2016-12-24 DIAGNOSIS — S062X0S Diffuse traumatic brain injury without loss of consciousness, sequela: Secondary | ICD-10-CM | POA: Diagnosis not present

## 2016-12-24 DIAGNOSIS — J449 Chronic obstructive pulmonary disease, unspecified: Secondary | ICD-10-CM | POA: Diagnosis not present

## 2016-12-24 DIAGNOSIS — I2510 Atherosclerotic heart disease of native coronary artery without angina pectoris: Secondary | ICD-10-CM | POA: Diagnosis not present

## 2016-12-24 DIAGNOSIS — S22080D Wedge compression fracture of T11-T12 vertebra, subsequent encounter for fracture with routine healing: Secondary | ICD-10-CM | POA: Diagnosis not present

## 2016-12-24 DIAGNOSIS — S32030D Wedge compression fracture of third lumbar vertebra, subsequent encounter for fracture with routine healing: Secondary | ICD-10-CM | POA: Diagnosis not present

## 2016-12-24 DIAGNOSIS — S32020D Wedge compression fracture of second lumbar vertebra, subsequent encounter for fracture with routine healing: Secondary | ICD-10-CM | POA: Diagnosis not present

## 2016-12-24 DIAGNOSIS — G40909 Epilepsy, unspecified, not intractable, without status epilepticus: Secondary | ICD-10-CM | POA: Diagnosis not present

## 2016-12-24 DIAGNOSIS — M48061 Spinal stenosis, lumbar region without neurogenic claudication: Secondary | ICD-10-CM | POA: Diagnosis not present

## 2016-12-24 DIAGNOSIS — S32010D Wedge compression fracture of first lumbar vertebra, subsequent encounter for fracture with routine healing: Secondary | ICD-10-CM | POA: Diagnosis not present

## 2016-12-24 NOTE — Telephone Encounter (Signed)
Lm for rtc- went straight to vmail

## 2016-12-25 NOTE — Telephone Encounter (Signed)
[name] [facility]) evaluated patient 12/25/16. Rec: PT 3x/wk for 1 wk, 2x/wk for 4 wks fall risk reduction.

## 2016-12-25 NOTE — Telephone Encounter (Signed)
VO for PT TOK

## 2016-12-25 NOTE — Telephone Encounter (Signed)
[name] notified of PT orders approved

## 2016-12-25 NOTE — Telephone Encounter (Signed)
Please call [name] Back regarding f/u from [place].

## 2016-12-28 ENCOUNTER — Telehealth: Payer: Self-pay | Admitting: Internal Medicine

## 2016-12-28 DIAGNOSIS — I2510 Atherosclerotic heart disease of native coronary artery without angina pectoris: Secondary | ICD-10-CM | POA: Diagnosis not present

## 2016-12-28 DIAGNOSIS — S22080D Wedge compression fracture of T11-T12 vertebra, subsequent encounter for fracture with routine healing: Secondary | ICD-10-CM | POA: Diagnosis not present

## 2016-12-28 DIAGNOSIS — S32010D Wedge compression fracture of first lumbar vertebra, subsequent encounter for fracture with routine healing: Secondary | ICD-10-CM | POA: Diagnosis not present

## 2016-12-28 DIAGNOSIS — S062X0S Diffuse traumatic brain injury without loss of consciousness, sequela: Secondary | ICD-10-CM | POA: Diagnosis not present

## 2016-12-28 DIAGNOSIS — G40909 Epilepsy, unspecified, not intractable, without status epilepticus: Secondary | ICD-10-CM | POA: Diagnosis not present

## 2016-12-28 DIAGNOSIS — S32020D Wedge compression fracture of second lumbar vertebra, subsequent encounter for fracture with routine healing: Secondary | ICD-10-CM | POA: Diagnosis not present

## 2016-12-28 DIAGNOSIS — J449 Chronic obstructive pulmonary disease, unspecified: Secondary | ICD-10-CM | POA: Diagnosis not present

## 2016-12-28 DIAGNOSIS — S32030D Wedge compression fracture of third lumbar vertebra, subsequent encounter for fracture with routine healing: Secondary | ICD-10-CM | POA: Diagnosis not present

## 2016-12-28 DIAGNOSIS — M48061 Spinal stenosis, lumbar region without neurogenic claudication: Secondary | ICD-10-CM | POA: Diagnosis not present

## 2016-12-28 NOTE — Telephone Encounter (Signed)
   Reason for call:   I received a call from [name]'s wife at 5:30 PM indicating that an order for an in-home chest x-ray is requested by home health services.   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  Home health PT services have told wife that patient's lungs sound have changed with possible aspiration. They have recommended an in-home CXR order through [facility] for further evaluation.   Assessment / Plan / Recommendations:   Will forward information to [facility] clinic front desk and PCP Dr. Dareen Piano for possible in-home chest x-ray order to be placed on 12/29/16 as [name] unable to perform today.  Can contact wife on her cell phone [phone] for further information.  As always, pt is advised that if symptoms worsen or new symptoms arise, they should go to an urgent care facility or to to ER for further evaluation.   Zada Finders, MD   12/28/2016, 5:40 PM

## 2016-12-29 DIAGNOSIS — J449 Chronic obstructive pulmonary disease, unspecified: Secondary | ICD-10-CM | POA: Diagnosis not present

## 2016-12-29 DIAGNOSIS — R5381 Other malaise: Secondary | ICD-10-CM | POA: Diagnosis not present

## 2016-12-29 DIAGNOSIS — Z993 Dependence on wheelchair: Secondary | ICD-10-CM | POA: Diagnosis not present

## 2016-12-29 DIAGNOSIS — R269 Unspecified abnormalities of gait and mobility: Secondary | ICD-10-CM | POA: Diagnosis not present

## 2016-12-29 DIAGNOSIS — R4182 Altered mental status, unspecified: Secondary | ICD-10-CM | POA: Diagnosis not present

## 2016-12-29 NOTE — Telephone Encounter (Signed)
Thank you Kaye 

## 2016-12-29 NOTE — Telephone Encounter (Signed)
(  Verbal order per DrNarendra) Call made to [facility],spoke w/Gina [phone] order given for portable CXR (2View)-paperwork will follow for pcp's signature. Imaging will take place later today, will inform pt's family.Despina Hidden Cassady9/4/201811:48 AM

## 2016-12-30 ENCOUNTER — Telehealth: Payer: Self-pay

## 2016-12-30 DIAGNOSIS — S32030D Wedge compression fracture of third lumbar vertebra, subsequent encounter for fracture with routine healing: Secondary | ICD-10-CM | POA: Diagnosis not present

## 2016-12-30 DIAGNOSIS — I2510 Atherosclerotic heart disease of native coronary artery without angina pectoris: Secondary | ICD-10-CM | POA: Diagnosis not present

## 2016-12-30 DIAGNOSIS — J449 Chronic obstructive pulmonary disease, unspecified: Secondary | ICD-10-CM | POA: Diagnosis not present

## 2016-12-30 DIAGNOSIS — S062X0S Diffuse traumatic brain injury without loss of consciousness, sequela: Secondary | ICD-10-CM | POA: Diagnosis not present

## 2016-12-30 DIAGNOSIS — S32020D Wedge compression fracture of second lumbar vertebra, subsequent encounter for fracture with routine healing: Secondary | ICD-10-CM | POA: Diagnosis not present

## 2016-12-30 DIAGNOSIS — S32010D Wedge compression fracture of first lumbar vertebra, subsequent encounter for fracture with routine healing: Secondary | ICD-10-CM | POA: Diagnosis not present

## 2016-12-30 DIAGNOSIS — S22080D Wedge compression fracture of T11-T12 vertebra, subsequent encounter for fracture with routine healing: Secondary | ICD-10-CM | POA: Diagnosis not present

## 2016-12-30 DIAGNOSIS — M48061 Spinal stenosis, lumbar region without neurogenic claudication: Secondary | ICD-10-CM | POA: Diagnosis not present

## 2016-12-30 DIAGNOSIS — G40909 Epilepsy, unspecified, not intractable, without status epilepticus: Secondary | ICD-10-CM | POA: Diagnosis not present

## 2016-12-30 NOTE — Telephone Encounter (Signed)
Todd [name] with [facility] requesting to speak with Todd [name]. Please call pt back.

## 2016-12-31 ENCOUNTER — Telehealth: Payer: Self-pay

## 2016-12-31 DIAGNOSIS — J449 Chronic obstructive pulmonary disease, unspecified: Secondary | ICD-10-CM | POA: Diagnosis not present

## 2016-12-31 DIAGNOSIS — S062X0S Diffuse traumatic brain injury without loss of consciousness, sequela: Secondary | ICD-10-CM | POA: Diagnosis not present

## 2016-12-31 DIAGNOSIS — S32010D Wedge compression fracture of first lumbar vertebra, subsequent encounter for fracture with routine healing: Secondary | ICD-10-CM | POA: Diagnosis not present

## 2016-12-31 DIAGNOSIS — S32030D Wedge compression fracture of third lumbar vertebra, subsequent encounter for fracture with routine healing: Secondary | ICD-10-CM | POA: Diagnosis not present

## 2016-12-31 DIAGNOSIS — S22080D Wedge compression fracture of T11-T12 vertebra, subsequent encounter for fracture with routine healing: Secondary | ICD-10-CM | POA: Diagnosis not present

## 2016-12-31 DIAGNOSIS — S32020D Wedge compression fracture of second lumbar vertebra, subsequent encounter for fracture with routine healing: Secondary | ICD-10-CM | POA: Diagnosis not present

## 2016-12-31 DIAGNOSIS — G40909 Epilepsy, unspecified, not intractable, without status epilepticus: Secondary | ICD-10-CM | POA: Diagnosis not present

## 2016-12-31 DIAGNOSIS — I2510 Atherosclerotic heart disease of native coronary artery without angina pectoris: Secondary | ICD-10-CM | POA: Diagnosis not present

## 2016-12-31 DIAGNOSIS — M48061 Spinal stenosis, lumbar region without neurogenic claudication: Secondary | ICD-10-CM | POA: Diagnosis not present

## 2016-12-31 NOTE — Telephone Encounter (Signed)
Spoke w/ jim hoffman, VO for PT to continue

## 2016-12-31 NOTE — Telephone Encounter (Signed)
Ebony Hail with [facility] requesting VO for speech therapy to address his swallowing. Please call back.
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
## 2017-01-01 DIAGNOSIS — J449 Chronic obstructive pulmonary disease, unspecified: Secondary | ICD-10-CM | POA: Diagnosis not present

## 2017-01-01 DIAGNOSIS — I2510 Atherosclerotic heart disease of native coronary artery without angina pectoris: Secondary | ICD-10-CM | POA: Diagnosis not present

## 2017-01-01 DIAGNOSIS — S22080D Wedge compression fracture of T11-T12 vertebra, subsequent encounter for fracture with routine healing: Secondary | ICD-10-CM | POA: Diagnosis not present

## 2017-01-01 DIAGNOSIS — S32020D Wedge compression fracture of second lumbar vertebra, subsequent encounter for fracture with routine healing: Secondary | ICD-10-CM | POA: Diagnosis not present

## 2017-01-01 DIAGNOSIS — G40909 Epilepsy, unspecified, not intractable, without status epilepticus: Secondary | ICD-10-CM | POA: Diagnosis not present

## 2017-01-01 DIAGNOSIS — S32010D Wedge compression fracture of first lumbar vertebra, subsequent encounter for fracture with routine healing: Secondary | ICD-10-CM | POA: Diagnosis not present

## 2017-01-01 DIAGNOSIS — S062X0S Diffuse traumatic brain injury without loss of consciousness, sequela: Secondary | ICD-10-CM | POA: Diagnosis not present

## 2017-01-01 DIAGNOSIS — M48061 Spinal stenosis, lumbar region without neurogenic claudication: Secondary | ICD-10-CM | POA: Diagnosis not present

## 2017-01-01 DIAGNOSIS — S32030D Wedge compression fracture of third lumbar vertebra, subsequent encounter for fracture with routine healing: Secondary | ICD-10-CM | POA: Diagnosis not present

## 2017-01-01 NOTE — Telephone Encounter (Signed)
I agree with PT order. Please let them know that the x ray was wnl.

## 2017-01-04 ENCOUNTER — Ambulatory Visit (INDEPENDENT_AMBULATORY_CARE_PROVIDER_SITE_OTHER): Payer: Medicare HMO | Admitting: Internal Medicine

## 2017-01-04 ENCOUNTER — Encounter: Payer: Self-pay | Admitting: Internal Medicine

## 2017-01-04 VITALS — BP 114/75 | HR 68 | Ht 71.0 in | Wt 190.1 lb

## 2017-01-04 DIAGNOSIS — S22080D Wedge compression fracture of T11-T12 vertebra, subsequent encounter for fracture with routine healing: Secondary | ICD-10-CM | POA: Diagnosis not present

## 2017-01-04 DIAGNOSIS — S32030D Wedge compression fracture of third lumbar vertebra, subsequent encounter for fracture with routine healing: Secondary | ICD-10-CM | POA: Diagnosis not present

## 2017-01-04 DIAGNOSIS — M48061 Spinal stenosis, lumbar region without neurogenic claudication: Secondary | ICD-10-CM | POA: Diagnosis not present

## 2017-01-04 DIAGNOSIS — J449 Chronic obstructive pulmonary disease, unspecified: Secondary | ICD-10-CM | POA: Diagnosis not present

## 2017-01-04 DIAGNOSIS — Z79891 Long term (current) use of opiate analgesic: Secondary | ICD-10-CM | POA: Diagnosis not present

## 2017-01-04 DIAGNOSIS — Z299 Encounter for prophylactic measures, unspecified: Secondary | ICD-10-CM

## 2017-01-04 DIAGNOSIS — I2510 Atherosclerotic heart disease of native coronary artery without angina pectoris: Secondary | ICD-10-CM | POA: Diagnosis not present

## 2017-01-04 DIAGNOSIS — G8929 Other chronic pain: Secondary | ICD-10-CM | POA: Diagnosis not present

## 2017-01-04 DIAGNOSIS — S062X0S Diffuse traumatic brain injury without loss of consciousness, sequela: Secondary | ICD-10-CM | POA: Diagnosis not present

## 2017-01-04 DIAGNOSIS — S32020D Wedge compression fracture of second lumbar vertebra, subsequent encounter for fracture with routine healing: Secondary | ICD-10-CM | POA: Diagnosis not present

## 2017-01-04 DIAGNOSIS — S32010D Wedge compression fracture of first lumbar vertebra, subsequent encounter for fracture with routine healing: Secondary | ICD-10-CM | POA: Diagnosis not present

## 2017-01-04 DIAGNOSIS — G40909 Epilepsy, unspecified, not intractable, without status epilepticus: Secondary | ICD-10-CM | POA: Diagnosis not present

## 2017-01-04 DIAGNOSIS — R4789 Other speech disturbances: Secondary | ICD-10-CM | POA: Diagnosis not present

## 2017-01-04 MED ORDER — OXYCODONE HCL 5 MG PO TABS: 2.5000 mg | ORAL_TABLET | Freq: Every day | ORAL | 0 refills | Status: DC | PRN

## 2017-01-04 NOTE — Assessment & Plan Note (Signed)
Patient received L3-L4 epidural injection when he was hospitalized in July and would like to repeat this  Plan -referred for VIR for repeat injection

## 2017-01-04 NOTE — Telephone Encounter (Signed)
No answer, lm 
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
## 2017-01-04 NOTE — Assessment & Plan Note (Signed)
Patient given flu shot today.

## 2017-01-05 DIAGNOSIS — S32020D Wedge compression fracture of second lumbar vertebra, subsequent encounter for fracture with routine healing: Secondary | ICD-10-CM | POA: Diagnosis not present

## 2017-01-05 DIAGNOSIS — M48061 Spinal stenosis, lumbar region without neurogenic claudication: Secondary | ICD-10-CM | POA: Diagnosis not present

## 2017-01-05 DIAGNOSIS — S22080D Wedge compression fracture of T11-T12 vertebra, subsequent encounter for fracture with routine healing: Secondary | ICD-10-CM | POA: Diagnosis not present

## 2017-01-05 DIAGNOSIS — G40909 Epilepsy, unspecified, not intractable, without status epilepticus: Secondary | ICD-10-CM | POA: Diagnosis not present

## 2017-01-05 DIAGNOSIS — I2510 Atherosclerotic heart disease of native coronary artery without angina pectoris: Secondary | ICD-10-CM | POA: Diagnosis not present

## 2017-01-05 DIAGNOSIS — S32030D Wedge compression fracture of third lumbar vertebra, subsequent encounter for fracture with routine healing: Secondary | ICD-10-CM | POA: Diagnosis not present

## 2017-01-05 DIAGNOSIS — J449 Chronic obstructive pulmonary disease, unspecified: Secondary | ICD-10-CM | POA: Diagnosis not present

## 2017-01-05 DIAGNOSIS — S062X0S Diffuse traumatic brain injury without loss of consciousness, sequela: Secondary | ICD-10-CM | POA: Diagnosis not present

## 2017-01-05 DIAGNOSIS — S32010D Wedge compression fracture of first lumbar vertebra, subsequent encounter for fracture with routine healing: Secondary | ICD-10-CM | POA: Diagnosis not present

## 2017-01-06 DIAGNOSIS — S062X0S Diffuse traumatic brain injury without loss of consciousness, sequela: Secondary | ICD-10-CM | POA: Diagnosis not present

## 2017-01-06 DIAGNOSIS — I2510 Atherosclerotic heart disease of native coronary artery without angina pectoris: Secondary | ICD-10-CM | POA: Diagnosis not present

## 2017-01-06 DIAGNOSIS — S22080D Wedge compression fracture of T11-T12 vertebra, subsequent encounter for fracture with routine healing: Secondary | ICD-10-CM | POA: Diagnosis not present

## 2017-01-06 DIAGNOSIS — M48061 Spinal stenosis, lumbar region without neurogenic claudication: Secondary | ICD-10-CM | POA: Diagnosis not present

## 2017-01-06 DIAGNOSIS — J449 Chronic obstructive pulmonary disease, unspecified: Secondary | ICD-10-CM | POA: Diagnosis not present

## 2017-01-06 DIAGNOSIS — G40909 Epilepsy, unspecified, not intractable, without status epilepticus: Secondary | ICD-10-CM | POA: Diagnosis not present

## 2017-01-06 DIAGNOSIS — S32020D Wedge compression fracture of second lumbar vertebra, subsequent encounter for fracture with routine healing: Secondary | ICD-10-CM | POA: Diagnosis not present

## 2017-01-06 DIAGNOSIS — S32030D Wedge compression fracture of third lumbar vertebra, subsequent encounter for fracture with routine healing: Secondary | ICD-10-CM | POA: Diagnosis not present

## 2017-01-06 DIAGNOSIS — S32010D Wedge compression fracture of first lumbar vertebra, subsequent encounter for fracture with routine healing: Secondary | ICD-10-CM | POA: Diagnosis not present

## 2017-01-06 NOTE — Progress Notes (Signed)
Internal Medicine Clinic Attending  I saw and evaluated the patient.  I personally confirmed the key portions of the history and exam documented by Dr. Saraiya and I reviewed pertinent patient test results.  The assessment, diagnosis, and plan were formulated together and I agree with the documentation in the resident's note.  

## 2017-01-07 ENCOUNTER — Other Ambulatory Visit: Payer: Self-pay | Admitting: Internal Medicine

## 2017-01-07 ENCOUNTER — Telehealth: Payer: Self-pay | Admitting: Internal Medicine

## 2017-01-07 DIAGNOSIS — M48061 Spinal stenosis, lumbar region without neurogenic claudication: Secondary | ICD-10-CM

## 2017-01-07 NOTE — Telephone Encounter (Signed)
[facility] PT calling for VO for missed visit and requesting to make up in the future.

## 2017-01-11 DIAGNOSIS — M48061 Spinal stenosis, lumbar region without neurogenic claudication: Secondary | ICD-10-CM | POA: Diagnosis not present

## 2017-01-11 DIAGNOSIS — S32030D Wedge compression fracture of third lumbar vertebra, subsequent encounter for fracture with routine healing: Secondary | ICD-10-CM | POA: Diagnosis not present

## 2017-01-11 DIAGNOSIS — I2510 Atherosclerotic heart disease of native coronary artery without angina pectoris: Secondary | ICD-10-CM | POA: Diagnosis not present

## 2017-01-11 DIAGNOSIS — S32010D Wedge compression fracture of first lumbar vertebra, subsequent encounter for fracture with routine healing: Secondary | ICD-10-CM | POA: Diagnosis not present

## 2017-01-11 DIAGNOSIS — S062X0S Diffuse traumatic brain injury without loss of consciousness, sequela: Secondary | ICD-10-CM | POA: Diagnosis not present

## 2017-01-11 DIAGNOSIS — J449 Chronic obstructive pulmonary disease, unspecified: Secondary | ICD-10-CM | POA: Diagnosis not present

## 2017-01-11 DIAGNOSIS — S32020D Wedge compression fracture of second lumbar vertebra, subsequent encounter for fracture with routine healing: Secondary | ICD-10-CM | POA: Diagnosis not present

## 2017-01-11 DIAGNOSIS — S22080D Wedge compression fracture of T11-T12 vertebra, subsequent encounter for fracture with routine healing: Secondary | ICD-10-CM | POA: Diagnosis not present

## 2017-01-11 DIAGNOSIS — G40909 Epilepsy, unspecified, not intractable, without status epilepticus: Secondary | ICD-10-CM | POA: Diagnosis not present

## 2017-01-12 ENCOUNTER — Telehealth: Payer: Self-pay

## 2017-01-12 DIAGNOSIS — I2510 Atherosclerotic heart disease of native coronary artery without angina pectoris: Secondary | ICD-10-CM | POA: Diagnosis not present

## 2017-01-12 DIAGNOSIS — S32010D Wedge compression fracture of first lumbar vertebra, subsequent encounter for fracture with routine healing: Secondary | ICD-10-CM | POA: Diagnosis not present

## 2017-01-12 DIAGNOSIS — S32020D Wedge compression fracture of second lumbar vertebra, subsequent encounter for fracture with routine healing: Secondary | ICD-10-CM | POA: Diagnosis not present

## 2017-01-12 DIAGNOSIS — S22080D Wedge compression fracture of T11-T12 vertebra, subsequent encounter for fracture with routine healing: Secondary | ICD-10-CM | POA: Diagnosis not present

## 2017-01-12 DIAGNOSIS — S062X0S Diffuse traumatic brain injury without loss of consciousness, sequela: Secondary | ICD-10-CM | POA: Diagnosis not present

## 2017-01-12 DIAGNOSIS — S32030D Wedge compression fracture of third lumbar vertebra, subsequent encounter for fracture with routine healing: Secondary | ICD-10-CM | POA: Diagnosis not present

## 2017-01-12 DIAGNOSIS — G40909 Epilepsy, unspecified, not intractable, without status epilepticus: Secondary | ICD-10-CM | POA: Diagnosis not present

## 2017-01-12 DIAGNOSIS — J449 Chronic obstructive pulmonary disease, unspecified: Secondary | ICD-10-CM | POA: Diagnosis not present

## 2017-01-12 DIAGNOSIS — M48061 Spinal stenosis, lumbar region without neurogenic claudication: Secondary | ICD-10-CM | POA: Diagnosis not present

## 2017-01-12 NOTE — Telephone Encounter (Signed)
Would like to speak with Todd [name]. Please call back.  

## 2017-01-13 DIAGNOSIS — S062X0S Diffuse traumatic brain injury without loss of consciousness, sequela: Secondary | ICD-10-CM | POA: Diagnosis not present

## 2017-01-13 DIAGNOSIS — S32030D Wedge compression fracture of third lumbar vertebra, subsequent encounter for fracture with routine healing: Secondary | ICD-10-CM | POA: Diagnosis not present

## 2017-01-13 DIAGNOSIS — M48061 Spinal stenosis, lumbar region without neurogenic claudication: Secondary | ICD-10-CM | POA: Diagnosis not present

## 2017-01-13 DIAGNOSIS — J449 Chronic obstructive pulmonary disease, unspecified: Secondary | ICD-10-CM | POA: Diagnosis not present

## 2017-01-13 DIAGNOSIS — S32020D Wedge compression fracture of second lumbar vertebra, subsequent encounter for fracture with routine healing: Secondary | ICD-10-CM | POA: Diagnosis not present

## 2017-01-13 DIAGNOSIS — I2510 Atherosclerotic heart disease of native coronary artery without angina pectoris: Secondary | ICD-10-CM | POA: Diagnosis not present

## 2017-01-13 DIAGNOSIS — G40909 Epilepsy, unspecified, not intractable, without status epilepticus: Secondary | ICD-10-CM | POA: Diagnosis not present

## 2017-01-13 DIAGNOSIS — S32010D Wedge compression fracture of first lumbar vertebra, subsequent encounter for fracture with routine healing: Secondary | ICD-10-CM | POA: Diagnosis not present

## 2017-01-13 DIAGNOSIS — S22080D Wedge compression fracture of T11-T12 vertebra, subsequent encounter for fracture with routine healing: Secondary | ICD-10-CM | POA: Diagnosis not present

## 2017-01-14 DIAGNOSIS — S32010D Wedge compression fracture of first lumbar vertebra, subsequent encounter for fracture with routine healing: Secondary | ICD-10-CM | POA: Diagnosis not present

## 2017-01-14 DIAGNOSIS — S062X0S Diffuse traumatic brain injury without loss of consciousness, sequela: Secondary | ICD-10-CM | POA: Diagnosis not present

## 2017-01-14 DIAGNOSIS — S32030D Wedge compression fracture of third lumbar vertebra, subsequent encounter for fracture with routine healing: Secondary | ICD-10-CM | POA: Diagnosis not present

## 2017-01-14 DIAGNOSIS — M48061 Spinal stenosis, lumbar region without neurogenic claudication: Secondary | ICD-10-CM | POA: Diagnosis not present

## 2017-01-14 DIAGNOSIS — I2510 Atherosclerotic heart disease of native coronary artery without angina pectoris: Secondary | ICD-10-CM | POA: Diagnosis not present

## 2017-01-14 DIAGNOSIS — G40909 Epilepsy, unspecified, not intractable, without status epilepticus: Secondary | ICD-10-CM | POA: Diagnosis not present

## 2017-01-14 DIAGNOSIS — S32020D Wedge compression fracture of second lumbar vertebra, subsequent encounter for fracture with routine healing: Secondary | ICD-10-CM | POA: Diagnosis not present

## 2017-01-14 DIAGNOSIS — J449 Chronic obstructive pulmonary disease, unspecified: Secondary | ICD-10-CM | POA: Diagnosis not present

## 2017-01-14 DIAGNOSIS — S22080D Wedge compression fracture of T11-T12 vertebra, subsequent encounter for fracture with routine healing: Secondary | ICD-10-CM | POA: Diagnosis not present

## 2017-01-18 ENCOUNTER — Telehealth: Payer: Self-pay | Admitting: Neurology

## 2017-01-18 DIAGNOSIS — S32010D Wedge compression fracture of first lumbar vertebra, subsequent encounter for fracture with routine healing: Secondary | ICD-10-CM | POA: Diagnosis not present

## 2017-01-18 DIAGNOSIS — I2510 Atherosclerotic heart disease of native coronary artery without angina pectoris: Secondary | ICD-10-CM | POA: Diagnosis not present

## 2017-01-18 DIAGNOSIS — M48061 Spinal stenosis, lumbar region without neurogenic claudication: Secondary | ICD-10-CM | POA: Diagnosis not present

## 2017-01-18 DIAGNOSIS — S32020D Wedge compression fracture of second lumbar vertebra, subsequent encounter for fracture with routine healing: Secondary | ICD-10-CM | POA: Diagnosis not present

## 2017-01-18 DIAGNOSIS — S062X0S Diffuse traumatic brain injury without loss of consciousness, sequela: Secondary | ICD-10-CM | POA: Diagnosis not present

## 2017-01-18 DIAGNOSIS — G40909 Epilepsy, unspecified, not intractable, without status epilepticus: Secondary | ICD-10-CM | POA: Diagnosis not present

## 2017-01-18 DIAGNOSIS — S22080D Wedge compression fracture of T11-T12 vertebra, subsequent encounter for fracture with routine healing: Secondary | ICD-10-CM | POA: Diagnosis not present

## 2017-01-18 DIAGNOSIS — J449 Chronic obstructive pulmonary disease, unspecified: Secondary | ICD-10-CM | POA: Diagnosis not present

## 2017-01-18 DIAGNOSIS — S32030D Wedge compression fracture of third lumbar vertebra, subsequent encounter for fracture with routine healing: Secondary | ICD-10-CM | POA: Diagnosis not present

## 2017-01-18 NOTE — Telephone Encounter (Signed)
Before we make any changes, can you pls ask her what the seizures were like, how long they lasted, has he been sleeping okay, no missed medications from vomiting or diarrhea, any illnesses? Thanks

## 2017-01-18 NOTE — Telephone Encounter (Signed)
Thanks for info. Lamictal dose is still on low side, increase to 1 tab BID. Pls see what she needs for [place] to get him this dose, I know he gets meds through the [place]. Thanks

## 2017-01-18 NOTE — Telephone Encounter (Signed)
Spoke with pt's wife, relaying message below.  She asked what would be some signs if he were to be given too much Lamictal.  I am unfamiliar with these, so I googled side effects and listed a few for pt's wife.  Pt's wife is now concerned that pt may be getting too much Lamictal since he is always sleepy.  She says that he usually doesn't get out of bed until 11AM, has fallen asleep at the table while eating lunch on more than one occasion, and is now starting to walk with his eyes closed.  She does not want to increase if he will not be able to engage in his own life.      Information below for future use:  Pt sees Dr Cherie Dark at the [facility] in [place] Phone - [phone] Fax - [phone]

## 2017-01-18 NOTE — Telephone Encounter (Signed)
Spoke with pt's wife.  She says that pt has not missed any medications, she dispenses his meds both AM and PM.  She says that his sleep has been very good recently, even joking that she wish she could fall asleep as fast as him.  She also says that he was not under any stress, or thinking of anything stressful when these seizures occurred.    States that 2 seizures lasted about 2 minutes (per pt, she did not witness these)   States that with the third, he "went in and out of space a few times over about 4 minutes" each episode of confusion lasting about 30-45 seconds.  She also says that if there is anything they can do, even if not changing medications, she would like to know.

## 2017-01-18 NOTE — Telephone Encounter (Signed)
Pt's wife called in regards to readjusting his Lamictal, please call and advise

## 2017-01-18 NOTE — Telephone Encounter (Signed)
Spoke with pt's wife who states that pt has had 3 "mini/breakthrough" seizures over the course of 4 days. 2 were "tought" per pt's wife. She thinks that his Lamictal should be increased.  Please advise.   Pt currently on 50mg  AM 100mg  QHS

## 2017-01-19 ENCOUNTER — Other Ambulatory Visit: Payer: Self-pay

## 2017-01-19 DIAGNOSIS — S062X0S Diffuse traumatic brain injury without loss of consciousness, sequela: Secondary | ICD-10-CM | POA: Diagnosis not present

## 2017-01-19 DIAGNOSIS — J449 Chronic obstructive pulmonary disease, unspecified: Secondary | ICD-10-CM | POA: Diagnosis not present

## 2017-01-19 DIAGNOSIS — M48061 Spinal stenosis, lumbar region without neurogenic claudication: Secondary | ICD-10-CM | POA: Diagnosis not present

## 2017-01-19 DIAGNOSIS — S32030D Wedge compression fracture of third lumbar vertebra, subsequent encounter for fracture with routine healing: Secondary | ICD-10-CM | POA: Diagnosis not present

## 2017-01-19 DIAGNOSIS — I2510 Atherosclerotic heart disease of native coronary artery without angina pectoris: Secondary | ICD-10-CM | POA: Diagnosis not present

## 2017-01-19 DIAGNOSIS — S22080D Wedge compression fracture of T11-T12 vertebra, subsequent encounter for fracture with routine healing: Secondary | ICD-10-CM | POA: Diagnosis not present

## 2017-01-19 DIAGNOSIS — S32020D Wedge compression fracture of second lumbar vertebra, subsequent encounter for fracture with routine healing: Secondary | ICD-10-CM | POA: Diagnosis not present

## 2017-01-19 DIAGNOSIS — S32010D Wedge compression fracture of first lumbar vertebra, subsequent encounter for fracture with routine healing: Secondary | ICD-10-CM | POA: Diagnosis not present

## 2017-01-19 DIAGNOSIS — G40909 Epilepsy, unspecified, not intractable, without status epilepticus: Secondary | ICD-10-CM | POA: Diagnosis not present

## 2017-01-19 MED ORDER — LAMOTRIGINE 100 MG PO TABS: ORAL_TABLET | ORAL | 6 refills | Status: DC

## 2017-01-19 NOTE — Telephone Encounter (Signed)
Spoke with pt's wife, relaying message below.  She states that pt is not taking "that much" of his pain medications, but understood what Dr. Delice Lesch was saying.  She agreed to the Lamictal increase.  Rx sent to local pharmacy and [place] per pt's wife's request as the [facility] pharmacy usually takes 2 weeks to fill.

## 2017-01-19 NOTE — Telephone Encounter (Signed)
Pls let her know I really don't think it's the Lamictal that is making him sleepy, he has been on the same dose since April. How much pain medication is he taking? Between the two, I would be more concerned of this causing drowsiness. Lamictal is actually usually more of an "up-per" and can cause insomnia rather than drowsiness.

## 2017-01-20 DIAGNOSIS — S22080D Wedge compression fracture of T11-T12 vertebra, subsequent encounter for fracture with routine healing: Secondary | ICD-10-CM | POA: Diagnosis not present

## 2017-01-20 DIAGNOSIS — S062X0S Diffuse traumatic brain injury without loss of consciousness, sequela: Secondary | ICD-10-CM | POA: Diagnosis not present

## 2017-01-20 DIAGNOSIS — I2510 Atherosclerotic heart disease of native coronary artery without angina pectoris: Secondary | ICD-10-CM | POA: Diagnosis not present

## 2017-01-20 DIAGNOSIS — M48061 Spinal stenosis, lumbar region without neurogenic claudication: Secondary | ICD-10-CM | POA: Diagnosis not present

## 2017-01-20 DIAGNOSIS — S32010D Wedge compression fracture of first lumbar vertebra, subsequent encounter for fracture with routine healing: Secondary | ICD-10-CM | POA: Diagnosis not present

## 2017-01-20 DIAGNOSIS — S32020D Wedge compression fracture of second lumbar vertebra, subsequent encounter for fracture with routine healing: Secondary | ICD-10-CM | POA: Diagnosis not present

## 2017-01-20 DIAGNOSIS — S32030D Wedge compression fracture of third lumbar vertebra, subsequent encounter for fracture with routine healing: Secondary | ICD-10-CM | POA: Diagnosis not present

## 2017-01-20 DIAGNOSIS — J449 Chronic obstructive pulmonary disease, unspecified: Secondary | ICD-10-CM | POA: Diagnosis not present

## 2017-01-20 DIAGNOSIS — G40909 Epilepsy, unspecified, not intractable, without status epilepticus: Secondary | ICD-10-CM | POA: Diagnosis not present

## 2017-01-22 DIAGNOSIS — S32010D Wedge compression fracture of first lumbar vertebra, subsequent encounter for fracture with routine healing: Secondary | ICD-10-CM | POA: Diagnosis not present

## 2017-01-22 DIAGNOSIS — S22080D Wedge compression fracture of T11-T12 vertebra, subsequent encounter for fracture with routine healing: Secondary | ICD-10-CM | POA: Diagnosis not present

## 2017-01-22 DIAGNOSIS — S062X0S Diffuse traumatic brain injury without loss of consciousness, sequela: Secondary | ICD-10-CM | POA: Diagnosis not present

## 2017-01-22 DIAGNOSIS — I2510 Atherosclerotic heart disease of native coronary artery without angina pectoris: Secondary | ICD-10-CM | POA: Diagnosis not present

## 2017-01-22 DIAGNOSIS — G40909 Epilepsy, unspecified, not intractable, without status epilepticus: Secondary | ICD-10-CM | POA: Diagnosis not present

## 2017-01-22 DIAGNOSIS — S32020D Wedge compression fracture of second lumbar vertebra, subsequent encounter for fracture with routine healing: Secondary | ICD-10-CM | POA: Diagnosis not present

## 2017-01-22 DIAGNOSIS — S32030D Wedge compression fracture of third lumbar vertebra, subsequent encounter for fracture with routine healing: Secondary | ICD-10-CM | POA: Diagnosis not present

## 2017-01-22 DIAGNOSIS — J449 Chronic obstructive pulmonary disease, unspecified: Secondary | ICD-10-CM | POA: Diagnosis not present

## 2017-01-22 DIAGNOSIS — M48061 Spinal stenosis, lumbar region without neurogenic claudication: Secondary | ICD-10-CM | POA: Diagnosis not present
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
## 2017-01-25 ENCOUNTER — Telehealth: Payer: Self-pay | Admitting: Internal Medicine

## 2017-01-25 DIAGNOSIS — I2510 Atherosclerotic heart disease of native coronary artery without angina pectoris: Secondary | ICD-10-CM | POA: Diagnosis not present

## 2017-01-25 DIAGNOSIS — S32010D Wedge compression fracture of first lumbar vertebra, subsequent encounter for fracture with routine healing: Secondary | ICD-10-CM | POA: Diagnosis not present

## 2017-01-25 DIAGNOSIS — J449 Chronic obstructive pulmonary disease, unspecified: Secondary | ICD-10-CM | POA: Diagnosis not present

## 2017-01-25 DIAGNOSIS — S32030D Wedge compression fracture of third lumbar vertebra, subsequent encounter for fracture with routine healing: Secondary | ICD-10-CM | POA: Diagnosis not present

## 2017-01-25 DIAGNOSIS — S22080D Wedge compression fracture of T11-T12 vertebra, subsequent encounter for fracture with routine healing: Secondary | ICD-10-CM | POA: Diagnosis not present

## 2017-01-25 DIAGNOSIS — M48061 Spinal stenosis, lumbar region without neurogenic claudication: Secondary | ICD-10-CM | POA: Diagnosis not present

## 2017-01-25 DIAGNOSIS — S32020D Wedge compression fracture of second lumbar vertebra, subsequent encounter for fracture with routine healing: Secondary | ICD-10-CM | POA: Diagnosis not present

## 2017-01-25 DIAGNOSIS — S062X0S Diffuse traumatic brain injury without loss of consciousness, sequela: Secondary | ICD-10-CM | POA: Diagnosis not present

## 2017-01-25 DIAGNOSIS — G40909 Epilepsy, unspecified, not intractable, without status epilepticus: Secondary | ICD-10-CM | POA: Diagnosis not present

## 2017-01-25 NOTE — Telephone Encounter (Signed)
[facility] asking for VO 2 times a week for 1 week OT.

## 2017-01-25 NOTE — Telephone Encounter (Signed)
Return call to Katherine,OT - requesting "OT 2 times a week x 1 week" VO given - if not appropriate, let me know.

## 2017-01-25 NOTE — Telephone Encounter (Signed)
I agree

## 2017-01-26 ENCOUNTER — Telehealth: Payer: Self-pay | Admitting: *Deleted

## 2017-01-26 DIAGNOSIS — J449 Chronic obstructive pulmonary disease, unspecified: Secondary | ICD-10-CM | POA: Diagnosis not present

## 2017-01-26 DIAGNOSIS — S32030D Wedge compression fracture of third lumbar vertebra, subsequent encounter for fracture with routine healing: Secondary | ICD-10-CM | POA: Diagnosis not present

## 2017-01-26 DIAGNOSIS — G40909 Epilepsy, unspecified, not intractable, without status epilepticus: Secondary | ICD-10-CM | POA: Diagnosis not present

## 2017-01-26 DIAGNOSIS — S22080D Wedge compression fracture of T11-T12 vertebra, subsequent encounter for fracture with routine healing: Secondary | ICD-10-CM | POA: Diagnosis not present

## 2017-01-26 DIAGNOSIS — M48061 Spinal stenosis, lumbar region without neurogenic claudication: Secondary | ICD-10-CM | POA: Diagnosis not present

## 2017-01-26 DIAGNOSIS — S062X0S Diffuse traumatic brain injury without loss of consciousness, sequela: Secondary | ICD-10-CM | POA: Diagnosis not present

## 2017-01-26 DIAGNOSIS — S32020D Wedge compression fracture of second lumbar vertebra, subsequent encounter for fracture with routine healing: Secondary | ICD-10-CM | POA: Diagnosis not present

## 2017-01-26 DIAGNOSIS — I2510 Atherosclerotic heart disease of native coronary artery without angina pectoris: Secondary | ICD-10-CM | POA: Diagnosis not present

## 2017-01-26 DIAGNOSIS — S32010D Wedge compression fracture of first lumbar vertebra, subsequent encounter for fracture with routine healing: Secondary | ICD-10-CM | POA: Diagnosis not present

## 2017-01-26 NOTE — Telephone Encounter (Addendum)
HHN calls and states that pt when he is having a meal in recent days while she is visiting that he becomes lethargic almost like drifting off to sleep, she has checked his 02 sats and they drop to hi 80's at times while eating also heart rate increases. She cannot detect aspiration and as of recent his normal cough is not noticed while eating. She states his 02 sat when not eating and at rest is appr 94%. His spouse ask if she should increase his guaifenesin to 1200mg  and is advised that nurse cannot instruct her on a change of medication that she may speak w/ a pharmacist or when pt comes for appt to discuss w/ physician. She is agreeable. She is ask to call 911 or bring pt to ED if he continues to be lethargic, appt made for thurs 10/4 [facility] at 1530 Would you want him to increase the guaifenesin before his visit Thursday?

## 2017-01-26 NOTE — Telephone Encounter (Signed)
I do not see a need to increase the guaifenesin at this time. We will evaluate him at Thursday's appointment. Thank you

## 2017-01-27 ENCOUNTER — Ambulatory Visit
Admission: RE | Admit: 2017-01-27 | Discharge: 2017-01-27 | Disposition: A | Discharge status: Home / Self Care | Payer: Medicare HMO | Source: Ambulatory Visit | Attending: Internal Medicine | Admitting: Internal Medicine

## 2017-01-27 DIAGNOSIS — M48061 Spinal stenosis, lumbar region without neurogenic claudication: Secondary | ICD-10-CM

## 2017-01-27 MED ORDER — IOPAMIDOL (ISOVUE-M 200) INJECTION 41%: 1.0000 mL | Freq: Once | INTRAMUSCULAR | Status: DC

## 2017-01-27 MED ORDER — IOPAMIDOL (ISOVUE-M 200) INJECTION 41%
1.0000 mL | Freq: Once | INTRAMUSCULAR | Status: AC
  Administered 2017-01-27: 1 mL via EPIDURAL

## 2017-01-27 MED ORDER — METHYLPREDNISOLONE ACETATE 40 MG/ML INJ SUSP (RADIOLOG: 120.0000 mg | Freq: Once | INTRAMUSCULAR | Status: DC

## 2017-01-27 MED ORDER — METHYLPREDNISOLONE ACETATE 40 MG/ML INJ SUSP (RADIOLOG
120.0000 mg | Freq: Once | INTRAMUSCULAR | Status: AC
  Administered 2017-01-27: 120 mg via EPIDURAL

## 2017-01-27 NOTE — Discharge Instructions (Signed)
Spinal Injection Discharge Instruction Sheet ° °1. You may resume a regular diet and any medications that you routinely take, including pain medications. ° °2. No driving the rest of the day of the procedure. ° °3. Light activity throughout the rest of the day.  Do not do any strenuous work, exercise, bending or lifting.  The day following the procedure, you may resume normal physical activity but you should refrain from exercising or physical therapy for at least three days. ° ° °Common Side Effects: ° °· Headaches- take your usual medications as directed by your physician.   ° °· Restlessness or inability to sleep- you may have trouble sleeping for the next few days.  Ask your referring physician if you need any medication for sleep if over the counter sleep medications do not help. ° °· Facial flushing or redness- this should subside within a few days. ° °· Increased pain- a temporary increase in pain a day or two following your procedure is not unusual.  Take your pain medication as prescribed by your referring physician.  You may use ice to the injection site as needed.  Please do not use heat for 24 hours. ° °· Leg cramps ° °Please contact our office at [phone] for the following symptoms: °· Fever greater than 100 degrees. °· Headaches unresolved with medication after 2-3 days. °· Increased swelling, pain, or redness at injection site. ° °Thank you for visiting our office. ° ° °You may resume Plavix today. °

## 2017-01-28 ENCOUNTER — Ambulatory Visit (INDEPENDENT_AMBULATORY_CARE_PROVIDER_SITE_OTHER): Payer: Medicare HMO | Admitting: Internal Medicine

## 2017-01-28 VITALS — BP 105/69 | HR 74 | Ht 71.0 in | Wt 186.0 lb

## 2017-01-28 DIAGNOSIS — Z8709 Personal history of other diseases of the respiratory system: Secondary | ICD-10-CM | POA: Diagnosis not present

## 2017-01-28 DIAGNOSIS — B1920 Unspecified viral hepatitis C without hepatic coma: Secondary | ICD-10-CM

## 2017-01-28 DIAGNOSIS — Z85828 Personal history of other malignant neoplasm of skin: Secondary | ICD-10-CM | POA: Diagnosis not present

## 2017-01-28 DIAGNOSIS — R05 Cough: Secondary | ICD-10-CM

## 2017-01-28 DIAGNOSIS — R059 Cough, unspecified: Secondary | ICD-10-CM

## 2017-01-28 DIAGNOSIS — R29898 Other symptoms and signs involving the musculoskeletal system: Secondary | ICD-10-CM

## 2017-01-28 DIAGNOSIS — H6122 Impacted cerumen, left ear: Secondary | ICD-10-CM | POA: Diagnosis not present

## 2017-01-28 DIAGNOSIS — J449 Chronic obstructive pulmonary disease, unspecified: Secondary | ICD-10-CM

## 2017-01-28 DIAGNOSIS — Z87820 Personal history of traumatic brain injury: Secondary | ICD-10-CM | POA: Diagnosis not present

## 2017-01-28 DIAGNOSIS — H903 Sensorineural hearing loss, bilateral: Secondary | ICD-10-CM | POA: Diagnosis not present

## 2017-01-28 DIAGNOSIS — Z974 Presence of external hearing-aid: Secondary | ICD-10-CM | POA: Diagnosis not present

## 2017-01-28 MED ORDER — GUAIFENESIN ER 1200 MG PO TB12: 1200.0000 mg | ORAL_TABLET | Freq: Two times a day (BID) | ORAL | 0 refills | Status: DC

## 2017-01-28 NOTE — Assessment & Plan Note (Addendum)
Patient is here because wife says that she has noticed the patient is falling asleep while eating and says that he is not able to finish meal. For few days, he has not been coughing like he usually does. She has not noticed any aspiration event. His speech pathologist said that the pt was hypoxic to 88% transiently while eating and it went back up. Today, he was 94% on room air. Review of meds indicate that lamictal dose was changed in April, so do not think this medicine is causing the change of symptoms. He is also only using the oxycodone PRN. Wife says that the guanfacine is helping him with cough and clearing secretions.   Plan -Reassured that we do not see any signs of aspiration,  -Pt will continue the guainfesin 1200 mg BID -We will fax the prescription to attn of Dr Rondell Reams at [phone] at the [facility]  Addendum: Prescription given to ms helen to fax

## 2017-01-28 NOTE — Assessment & Plan Note (Addendum)
Patient is here accompanied by wife who says that she has noticed he is not letting go of objects easily, and has an odd way of taking a cup in the hand. She has noticed this several times, and she saw this when the patient was going down the stairs today and could not let go of the staircase, as well as the car door. She is concerned there may be a neurologic process going on, and what to do about this.  His neurologic exam is completely unremarkable, with normal strength, and normal reflexes. He has normal muscle tone, no cogwheel rigidity, and no signs of spasticity. He is able to open and close the hand and make a fist without any issue.   Plan -advised for continuing occupational therapy and PT sessions, to focus on the grip strength -continue to monitor -will obtain a BMET to make sure calcium levels are normal  Addendum bmet unremarkable. Normal ca

## 2017-01-28 NOTE — Patient Instructions (Signed)
Thank you for your visit today Please continue the physical and occupational therapy I will send the prescription for mucinex- it will be faxed to the [facility] hospital at the number you gave.
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
## 2017-01-29 ENCOUNTER — Telehealth: Payer: Self-pay

## 2017-01-29 ENCOUNTER — Telehealth: Payer: Self-pay | Admitting: *Deleted

## 2017-01-29 DIAGNOSIS — M48061 Spinal stenosis, lumbar region without neurogenic claudication: Secondary | ICD-10-CM | POA: Diagnosis not present

## 2017-01-29 DIAGNOSIS — I2510 Atherosclerotic heart disease of native coronary artery without angina pectoris: Secondary | ICD-10-CM | POA: Diagnosis not present

## 2017-01-29 DIAGNOSIS — G40909 Epilepsy, unspecified, not intractable, without status epilepticus: Secondary | ICD-10-CM | POA: Diagnosis not present

## 2017-01-29 DIAGNOSIS — S32020D Wedge compression fracture of second lumbar vertebra, subsequent encounter for fracture with routine healing: Secondary | ICD-10-CM | POA: Diagnosis not present

## 2017-01-29 DIAGNOSIS — S22080D Wedge compression fracture of T11-T12 vertebra, subsequent encounter for fracture with routine healing: Secondary | ICD-10-CM | POA: Diagnosis not present

## 2017-01-29 DIAGNOSIS — S062X0S Diffuse traumatic brain injury without loss of consciousness, sequela: Secondary | ICD-10-CM | POA: Diagnosis not present

## 2017-01-29 DIAGNOSIS — S32010D Wedge compression fracture of first lumbar vertebra, subsequent encounter for fracture with routine healing: Secondary | ICD-10-CM | POA: Diagnosis not present

## 2017-01-29 DIAGNOSIS — J449 Chronic obstructive pulmonary disease, unspecified: Secondary | ICD-10-CM | POA: Diagnosis not present

## 2017-01-29 DIAGNOSIS — S32030D Wedge compression fracture of third lumbar vertebra, subsequent encounter for fracture with routine healing: Secondary | ICD-10-CM | POA: Diagnosis not present

## 2017-01-29 LAB — BMP8+ANION GAP
ANION GAP: 11 mmol/L (ref 10.0–18.0)
BUN/Creatinine Ratio: 15 (ref 10–24)
BUN: 14 mg/dL (ref 8–27)
CALCIUM: 9 mg/dL (ref 8.6–10.2)
CHLORIDE: 107 mmol/L — AB (ref 96–106)
CO2: 24 mmol/L (ref 20–29)
Creatinine, Ser: 0.96 mg/dL (ref 0.76–1.27)
GFR calc Af Amer: 93 mL/min/{1.73_m2} (ref >59)
GFR calc non Af Amer: 80 mL/min/{1.73_m2} (ref >59)
GLUCOSE: 106 mg/dL — AB (ref 65–99)
POTASSIUM: 4.4 mmol/L (ref 3.5–5.2)
SODIUM: 142 mmol/L (ref 134–144)

## 2017-01-29 MED ORDER — GUAIFENESIN ER 1200 MG PO TB12: 1200.0000 mg | ORAL_TABLET | Freq: Two times a day (BID) | ORAL | 0 refills | Status: DC

## 2017-01-29 NOTE — Telephone Encounter (Signed)
Spoke w/ spouse and sent request Paperwork faxed to [place]

## 2017-01-29 NOTE — Telephone Encounter (Signed)
I agree. Thank you.

## 2017-01-29 NOTE — Telephone Encounter (Signed)
Belenda Cruise OT calls for VO for recert of OT 2x week for 2 weeks and 1x week for 2 weeks, safety issues, fine motor, splinting. Do you agree?  Also pt's spouse ask at this time for an FL2 be started for pt to go to [facility] [place] at [phone]  Sending to dr Dareen Piano, ch[place].

## 2017-01-29 NOTE — Progress Notes (Signed)
Internal Medicine Clinic Attending  I saw and evaluated the patient.  I personally confirmed the key portions of the history and exam documented by Dr. Saraiya and I reviewed pertinent patient test results.  The assessment, diagnosis, and plan were formulated together and I agree with the documentation in the resident's note.  

## 2017-01-29 NOTE — Telephone Encounter (Signed)
Jim with [facility] requesting VO. Please call back.

## 2017-01-29 NOTE — Telephone Encounter (Signed)
Requesting to speak with Helen. Please call back.  

## 2017-01-29 NOTE — Addendum Note (Signed)
Addended by: Burgess Estelle A on: 01/29/2017 12:18 PM   Modules accepted: Orders

## 2017-02-01 ENCOUNTER — Telehealth: Payer: Self-pay | Admitting: Internal Medicine

## 2017-02-01 ENCOUNTER — Telehealth: Payer: Self-pay | Admitting: *Deleted

## 2017-02-01 DIAGNOSIS — S32010D Wedge compression fracture of first lumbar vertebra, subsequent encounter for fracture with routine healing: Secondary | ICD-10-CM | POA: Diagnosis not present

## 2017-02-01 DIAGNOSIS — G40909 Epilepsy, unspecified, not intractable, without status epilepticus: Secondary | ICD-10-CM | POA: Diagnosis not present

## 2017-02-01 DIAGNOSIS — I2510 Atherosclerotic heart disease of native coronary artery without angina pectoris: Secondary | ICD-10-CM | POA: Diagnosis not present

## 2017-02-01 DIAGNOSIS — S062X0S Diffuse traumatic brain injury without loss of consciousness, sequela: Secondary | ICD-10-CM | POA: Diagnosis not present

## 2017-02-01 DIAGNOSIS — S22080D Wedge compression fracture of T11-T12 vertebra, subsequent encounter for fracture with routine healing: Secondary | ICD-10-CM | POA: Diagnosis not present

## 2017-02-01 DIAGNOSIS — R1312 Dysphagia, oropharyngeal phase: Secondary | ICD-10-CM | POA: Diagnosis not present

## 2017-02-01 DIAGNOSIS — M48061 Spinal stenosis, lumbar region without neurogenic claudication: Secondary | ICD-10-CM | POA: Diagnosis not present

## 2017-02-01 DIAGNOSIS — S32030D Wedge compression fracture of third lumbar vertebra, subsequent encounter for fracture with routine healing: Secondary | ICD-10-CM | POA: Diagnosis not present

## 2017-02-01 DIAGNOSIS — S32020D Wedge compression fracture of second lumbar vertebra, subsequent encounter for fracture with routine healing: Secondary | ICD-10-CM | POA: Diagnosis not present

## 2017-02-01 NOTE — Telephone Encounter (Addendum)
Call to [facility], patient's wife to see where patient would like to get his PT.  Does patient wish to do at home or go to the the facility per Dr. Lorie Phenix.   Message was left for Sheri to return call to the Clinics.   Sander Nephew, RN 02/01/2017 1109 AM. RTC from [place].  Sherri would like for patient to have OT of the hand  that  would help patient to feed himself.  Already has a person who is doing OT for patient's hand.  Stated that the Nurse had called and got a verbal ok to continue from [place], [place].  Goal is to get patient into a facility to do intensive PT and OT. Will send message a message to Dr Dareen Piano and Obie Dredge  For authorization to continue.  Sander Nephew, RN 02/01/2917 11:45 AM.

## 2017-02-01 NOTE — Telephone Encounter (Signed)
I agree

## 2017-02-01 NOTE — Telephone Encounter (Signed)
I think Bonnita Nasuti already asked for a verbal order which I agreed to

## 2017-02-01 NOTE — Telephone Encounter (Signed)
I put order for home health PT, OT, and social work and discussed with ms Chillon

## 2017-02-01 NOTE — Telephone Encounter (Signed)
Rec'd phone call from [facility] @ [facility] in reference to this patient regarding Social Work.  Returned phone call to [phone] Ext. [phone] Todd [name]) unable to speak with her.  LMOM for her to call back.

## 2017-02-01 NOTE — Telephone Encounter (Signed)
VO for PT for next 4 weeks, this is recert, do you agree to continue PT?

## 2017-02-01 NOTE — Addendum Note (Signed)
Addended by: Burgess Estelle A on: 02/01/2017 03:04 PM   Modules accepted: Orders

## 2017-02-01 NOTE — Telephone Encounter (Signed)
Returned call to [place] @ the [facility].  Per Colletta [place] their Facility will not be able to accept this patient for admission as he needs a Higher Quality Level of Care, and due to his Insurance that they can not provide care Requested.  Called patient wife to discuss his FL2 request.  No answer.  LMOM for [name] (Wife) to call back.

## 2017-02-02 ENCOUNTER — Encounter: Payer: Self-pay | Admitting: Internal Medicine

## 2017-02-02 DIAGNOSIS — S062X0S Diffuse traumatic brain injury without loss of consciousness, sequela: Secondary | ICD-10-CM | POA: Diagnosis not present

## 2017-02-02 DIAGNOSIS — G40909 Epilepsy, unspecified, not intractable, without status epilepticus: Secondary | ICD-10-CM | POA: Diagnosis not present

## 2017-02-02 DIAGNOSIS — S32030D Wedge compression fracture of third lumbar vertebra, subsequent encounter for fracture with routine healing: Secondary | ICD-10-CM | POA: Diagnosis not present

## 2017-02-02 DIAGNOSIS — M48061 Spinal stenosis, lumbar region without neurogenic claudication: Secondary | ICD-10-CM | POA: Diagnosis not present

## 2017-02-02 DIAGNOSIS — S32020D Wedge compression fracture of second lumbar vertebra, subsequent encounter for fracture with routine healing: Secondary | ICD-10-CM | POA: Diagnosis not present

## 2017-02-02 DIAGNOSIS — S22080D Wedge compression fracture of T11-T12 vertebra, subsequent encounter for fracture with routine healing: Secondary | ICD-10-CM | POA: Diagnosis not present

## 2017-02-02 DIAGNOSIS — I2510 Atherosclerotic heart disease of native coronary artery without angina pectoris: Secondary | ICD-10-CM | POA: Diagnosis not present

## 2017-02-02 DIAGNOSIS — S32010D Wedge compression fracture of first lumbar vertebra, subsequent encounter for fracture with routine healing: Secondary | ICD-10-CM | POA: Diagnosis not present

## 2017-02-02 DIAGNOSIS — R1312 Dysphagia, oropharyngeal phase: Secondary | ICD-10-CM | POA: Diagnosis not present

## 2017-02-03 DIAGNOSIS — S22080D Wedge compression fracture of T11-T12 vertebra, subsequent encounter for fracture with routine healing: Secondary | ICD-10-CM | POA: Diagnosis not present

## 2017-02-03 DIAGNOSIS — S062X0S Diffuse traumatic brain injury without loss of consciousness, sequela: Secondary | ICD-10-CM | POA: Diagnosis not present

## 2017-02-03 DIAGNOSIS — G40909 Epilepsy, unspecified, not intractable, without status epilepticus: Secondary | ICD-10-CM | POA: Diagnosis not present

## 2017-02-03 DIAGNOSIS — S32030D Wedge compression fracture of third lumbar vertebra, subsequent encounter for fracture with routine healing: Secondary | ICD-10-CM | POA: Diagnosis not present

## 2017-02-03 DIAGNOSIS — R1312 Dysphagia, oropharyngeal phase: Secondary | ICD-10-CM | POA: Diagnosis not present

## 2017-02-03 DIAGNOSIS — I2510 Atherosclerotic heart disease of native coronary artery without angina pectoris: Secondary | ICD-10-CM | POA: Diagnosis not present

## 2017-02-03 DIAGNOSIS — S32020D Wedge compression fracture of second lumbar vertebra, subsequent encounter for fracture with routine healing: Secondary | ICD-10-CM | POA: Diagnosis not present

## 2017-02-03 DIAGNOSIS — S32010D Wedge compression fracture of first lumbar vertebra, subsequent encounter for fracture with routine healing: Secondary | ICD-10-CM | POA: Diagnosis not present

## 2017-02-03 DIAGNOSIS — M48061 Spinal stenosis, lumbar region without neurogenic claudication: Secondary | ICD-10-CM | POA: Diagnosis not present

## 2017-02-04 DIAGNOSIS — R1312 Dysphagia, oropharyngeal phase: Secondary | ICD-10-CM | POA: Diagnosis not present

## 2017-02-04 DIAGNOSIS — S32020D Wedge compression fracture of second lumbar vertebra, subsequent encounter for fracture with routine healing: Secondary | ICD-10-CM | POA: Diagnosis not present

## 2017-02-04 DIAGNOSIS — I2510 Atherosclerotic heart disease of native coronary artery without angina pectoris: Secondary | ICD-10-CM | POA: Diagnosis not present

## 2017-02-04 DIAGNOSIS — S22080D Wedge compression fracture of T11-T12 vertebra, subsequent encounter for fracture with routine healing: Secondary | ICD-10-CM | POA: Diagnosis not present

## 2017-02-04 DIAGNOSIS — S32010D Wedge compression fracture of first lumbar vertebra, subsequent encounter for fracture with routine healing: Secondary | ICD-10-CM | POA: Diagnosis not present

## 2017-02-04 DIAGNOSIS — G40909 Epilepsy, unspecified, not intractable, without status epilepticus: Secondary | ICD-10-CM | POA: Diagnosis not present

## 2017-02-04 DIAGNOSIS — S32030D Wedge compression fracture of third lumbar vertebra, subsequent encounter for fracture with routine healing: Secondary | ICD-10-CM | POA: Diagnosis not present

## 2017-02-04 DIAGNOSIS — M48061 Spinal stenosis, lumbar region without neurogenic claudication: Secondary | ICD-10-CM | POA: Diagnosis not present

## 2017-02-04 DIAGNOSIS — S062X0S Diffuse traumatic brain injury without loss of consciousness, sequela: Secondary | ICD-10-CM | POA: Diagnosis not present

## 2017-02-05 DIAGNOSIS — S22080D Wedge compression fracture of T11-T12 vertebra, subsequent encounter for fracture with routine healing: Secondary | ICD-10-CM | POA: Diagnosis not present

## 2017-02-05 DIAGNOSIS — S32010D Wedge compression fracture of first lumbar vertebra, subsequent encounter for fracture with routine healing: Secondary | ICD-10-CM | POA: Diagnosis not present

## 2017-02-05 DIAGNOSIS — I2510 Atherosclerotic heart disease of native coronary artery without angina pectoris: Secondary | ICD-10-CM | POA: Diagnosis not present

## 2017-02-05 DIAGNOSIS — S062X0S Diffuse traumatic brain injury without loss of consciousness, sequela: Secondary | ICD-10-CM | POA: Diagnosis not present

## 2017-02-05 DIAGNOSIS — S32020D Wedge compression fracture of second lumbar vertebra, subsequent encounter for fracture with routine healing: Secondary | ICD-10-CM | POA: Diagnosis not present

## 2017-02-05 DIAGNOSIS — S32030D Wedge compression fracture of third lumbar vertebra, subsequent encounter for fracture with routine healing: Secondary | ICD-10-CM | POA: Diagnosis not present

## 2017-02-05 DIAGNOSIS — M48061 Spinal stenosis, lumbar region without neurogenic claudication: Secondary | ICD-10-CM | POA: Diagnosis not present

## 2017-02-05 DIAGNOSIS — G40909 Epilepsy, unspecified, not intractable, without status epilepticus: Secondary | ICD-10-CM | POA: Diagnosis not present

## 2017-02-05 DIAGNOSIS — R1312 Dysphagia, oropharyngeal phase: Secondary | ICD-10-CM | POA: Diagnosis not present

## 2017-02-05 NOTE — Telephone Encounter (Signed)
FL2 form to [place] has been completed and faxed to her attention @ [phone]

## 2017-02-08 DIAGNOSIS — S32020D Wedge compression fracture of second lumbar vertebra, subsequent encounter for fracture with routine healing: Secondary | ICD-10-CM | POA: Diagnosis not present

## 2017-02-08 DIAGNOSIS — S062X0S Diffuse traumatic brain injury without loss of consciousness, sequela: Secondary | ICD-10-CM | POA: Diagnosis not present

## 2017-02-08 DIAGNOSIS — S32010D Wedge compression fracture of first lumbar vertebra, subsequent encounter for fracture with routine healing: Secondary | ICD-10-CM | POA: Diagnosis not present

## 2017-02-08 DIAGNOSIS — R1312 Dysphagia, oropharyngeal phase: Secondary | ICD-10-CM | POA: Diagnosis not present

## 2017-02-08 DIAGNOSIS — M48061 Spinal stenosis, lumbar region without neurogenic claudication: Secondary | ICD-10-CM | POA: Diagnosis not present

## 2017-02-08 DIAGNOSIS — G40909 Epilepsy, unspecified, not intractable, without status epilepticus: Secondary | ICD-10-CM | POA: Diagnosis not present

## 2017-02-08 DIAGNOSIS — I2510 Atherosclerotic heart disease of native coronary artery without angina pectoris: Secondary | ICD-10-CM | POA: Diagnosis not present

## 2017-02-08 DIAGNOSIS — S22080D Wedge compression fracture of T11-T12 vertebra, subsequent encounter for fracture with routine healing: Secondary | ICD-10-CM | POA: Diagnosis not present

## 2017-02-08 DIAGNOSIS — S32030D Wedge compression fracture of third lumbar vertebra, subsequent encounter for fracture with routine healing: Secondary | ICD-10-CM | POA: Diagnosis not present

## 2017-02-09 ENCOUNTER — Telehealth: Payer: Self-pay | Admitting: Internal Medicine

## 2017-02-09 NOTE — Telephone Encounter (Signed)
Rec'd phone call from [name] wife [name] and the Admissions Coordinator Gustavus Messing. @ [facility], requesting per the pt's [facility] to have a hand written Prescription to accompany his FL2 form already completed previously by PCP. Pleas e Advise if this can be written today for the patient.  The facility has a bed that will come available Thursday and he will lose his spot if this can not be done.

## 2017-02-11 DIAGNOSIS — Z87820 Personal history of traumatic brain injury: Secondary | ICD-10-CM | POA: Diagnosis not present

## 2017-02-11 DIAGNOSIS — R1312 Dysphagia, oropharyngeal phase: Secondary | ICD-10-CM | POA: Diagnosis not present

## 2017-02-11 DIAGNOSIS — R278 Other lack of coordination: Secondary | ICD-10-CM | POA: Diagnosis not present

## 2017-02-11 DIAGNOSIS — J449 Chronic obstructive pulmonary disease, unspecified: Secondary | ICD-10-CM | POA: Diagnosis not present

## 2017-02-11 DIAGNOSIS — R1310 Dysphagia, unspecified: Secondary | ICD-10-CM | POA: Diagnosis not present

## 2017-02-11 DIAGNOSIS — R296 Repeated falls: Secondary | ICD-10-CM | POA: Diagnosis not present

## 2017-02-11 DIAGNOSIS — E785 Hyperlipidemia, unspecified: Secondary | ICD-10-CM | POA: Diagnosis not present

## 2017-02-11 DIAGNOSIS — S069X0A Unspecified intracranial injury without loss of consciousness, initial encounter: Secondary | ICD-10-CM | POA: Diagnosis not present

## 2017-02-11 DIAGNOSIS — I2510 Atherosclerotic heart disease of native coronary artery without angina pectoris: Secondary | ICD-10-CM | POA: Diagnosis not present

## 2017-02-11 DIAGNOSIS — K219 Gastro-esophageal reflux disease without esophagitis: Secondary | ICD-10-CM | POA: Diagnosis not present

## 2017-02-11 DIAGNOSIS — M48061 Spinal stenosis, lumbar region without neurogenic claudication: Secondary | ICD-10-CM | POA: Diagnosis not present

## 2017-02-11 DIAGNOSIS — M6281 Muscle weakness (generalized): Secondary | ICD-10-CM | POA: Diagnosis not present

## 2017-02-11 DIAGNOSIS — I639 Cerebral infarction, unspecified: Secondary | ICD-10-CM | POA: Diagnosis not present

## 2017-02-11 DIAGNOSIS — G40909 Epilepsy, unspecified, not intractable, without status epilepticus: Secondary | ICD-10-CM | POA: Diagnosis not present

## 2017-02-11 DIAGNOSIS — M4800 Spinal stenosis, site unspecified: Secondary | ICD-10-CM | POA: Diagnosis not present

## 2017-02-12 DIAGNOSIS — J449 Chronic obstructive pulmonary disease, unspecified: Secondary | ICD-10-CM | POA: Diagnosis not present

## 2017-02-12 DIAGNOSIS — I639 Cerebral infarction, unspecified: Secondary | ICD-10-CM | POA: Diagnosis not present

## 2017-02-12 DIAGNOSIS — M48061 Spinal stenosis, lumbar region without neurogenic claudication: Secondary | ICD-10-CM | POA: Diagnosis not present

## 2017-02-12 DIAGNOSIS — Z87820 Personal history of traumatic brain injury: Secondary | ICD-10-CM | POA: Diagnosis not present

## 2017-02-17 DIAGNOSIS — S069X0A Unspecified intracranial injury without loss of consciousness, initial encounter: Secondary | ICD-10-CM | POA: Diagnosis not present

## 2017-02-17 DIAGNOSIS — R1310 Dysphagia, unspecified: Secondary | ICD-10-CM | POA: Diagnosis not present

## 2017-02-17 DIAGNOSIS — M4800 Spinal stenosis, site unspecified: Secondary | ICD-10-CM | POA: Diagnosis not present

## 2017-02-19 DIAGNOSIS — M48061 Spinal stenosis, lumbar region without neurogenic claudication: Secondary | ICD-10-CM | POA: Diagnosis not present

## 2017-02-19 DIAGNOSIS — I639 Cerebral infarction, unspecified: Secondary | ICD-10-CM | POA: Diagnosis not present

## 2017-02-19 DIAGNOSIS — R1310 Dysphagia, unspecified: Secondary | ICD-10-CM | POA: Diagnosis not present

## 2017-02-19 DIAGNOSIS — R296 Repeated falls: Secondary | ICD-10-CM | POA: Diagnosis not present

## 2017-02-25 DIAGNOSIS — I2510 Atherosclerotic heart disease of native coronary artery without angina pectoris: Secondary | ICD-10-CM | POA: Diagnosis not present

## 2017-02-25 DIAGNOSIS — M48061 Spinal stenosis, lumbar region without neurogenic claudication: Secondary | ICD-10-CM | POA: Diagnosis not present

## 2017-02-25 DIAGNOSIS — G40909 Epilepsy, unspecified, not intractable, without status epilepticus: Secondary | ICD-10-CM | POA: Diagnosis not present

## 2017-02-25 DIAGNOSIS — I639 Cerebral infarction, unspecified: Secondary | ICD-10-CM | POA: Diagnosis not present

## 2017-03-02 ENCOUNTER — Other Ambulatory Visit: Payer: Self-pay | Admitting: *Deleted

## 2017-03-02 MED ORDER — GUAIFENESIN ER 1200 MG PO TB12: 1200.0000 mg | ORAL_TABLET | Freq: Two times a day (BID) | ORAL | 0 refills | Status: DC

## 2017-03-03 ENCOUNTER — Telehealth: Payer: Self-pay

## 2017-03-03 NOTE — Telephone Encounter (Signed)
Do you need a verbal order or for me to put them in again?

## 2017-03-03 NOTE — Telephone Encounter (Signed)
Thank you :)

## 2017-03-03 NOTE — Telephone Encounter (Signed)
Wife states pt is back home today from snf, states pt was declining rapidly so was discharged home 11/6 She is requesting [place] services of [facility]G for disease management, medication management and education, PT for safety, balance, strength training, gait and mobility, OT for safety, management of environment and independence, Speech therapy for swallowing, safety while consuming liquids, solids and medicine.  Please approve these orders and also sending to chilonb. For processing ASAP per pt's spouse She states to tell all thank you for your patience and quick actions on this matter

## 2017-03-03 NOTE — Telephone Encounter (Signed)
Called Pt's wife and arranged an appointment for 03/05/2017 per Dr. Dareen Piano for the [facility] in order to start [facility] services.  Medicare requires patient to have had an appointment within 30 days.

## 2017-03-03 NOTE — Telephone Encounter (Signed)
Would like Todd [name] to call back.  

## 2017-03-03 NOTE — Telephone Encounter (Signed)
Put them in as written order, thanks!

## 2017-03-04 ENCOUNTER — Other Ambulatory Visit: Payer: Self-pay | Admitting: *Deleted

## 2017-03-04 NOTE — Patient Outreach (Signed)
[facility]) Care Management  03/04/2017  CARRSON LIGHTCAP 04-17-1946 [id number]  Transition of Care Referral  Referral Date: 03/04/17 Referral Source: [employer] Date of Discharge: 03/02/17 Facility: [facility] Discharge Diagnosis: Weakness Insurance: [facility]   Outreach attempt #1 to patient. Spouse stated, patient is not available. Family member requested a return phone call at a later time.    Plan: RN CM will contact patient within one week.    [name], RN, BSN, MHA/MSL, [place] Telephonic Care Manager Coordinator [facility] Direct Phone: [phone] Toll Free: [phone] Fax: [phone]

## 2017-03-05 ENCOUNTER — Other Ambulatory Visit: Payer: Self-pay | Admitting: *Deleted

## 2017-03-05 ENCOUNTER — Ambulatory Visit: Payer: Medicare HMO

## 2017-03-05 ENCOUNTER — Ambulatory Visit: Payer: Self-pay | Admitting: *Deleted
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
## 2017-03-09 ENCOUNTER — Other Ambulatory Visit: Payer: Self-pay | Admitting: *Deleted

## 2017-03-09 ENCOUNTER — Encounter: Payer: Self-pay | Admitting: *Deleted

## 2017-03-09 NOTE — Patient Outreach (Signed)
[facility]) Care Management  03/09/2017  Todd [name] 06-15-1945 [id number]    RN received a referral on pt 11/12  RN completed a call on pt today with his spouse [name]) and introduced [facility] services. RN explained the purpose for today's call and verifying pt's identifiers. Wife indicated she just needed someone to come "every now and then" to see if pt has aspirated. RN explain [facility] services further in detail concerning enrolling pt into a program (spouse refused any type of program). RN further explained the program related to the pt's medical problem she as his aspiration and verified [facility] is a shot term service that offers disease management over a few months with pt's participation to the assigned program with goals that are discussed and review as far as pt's progress. Wife states she was not interested in this services and discontinued the call. No additional services will be extended at this time with this RN case manager. Case will be closed and primary notified.  Raina Mina, RN Care Management Coordinator [place] Office [phone]

## 2017-03-10 ENCOUNTER — Ambulatory Visit (INDEPENDENT_AMBULATORY_CARE_PROVIDER_SITE_OTHER): Payer: Medicare HMO | Admitting: Internal Medicine

## 2017-03-10 ENCOUNTER — Other Ambulatory Visit: Payer: Self-pay

## 2017-03-10 VITALS — BP 105/70 | HR 70 | Ht 71.0 in

## 2017-03-10 DIAGNOSIS — R05 Cough: Secondary | ICD-10-CM

## 2017-03-10 DIAGNOSIS — R531 Weakness: Secondary | ICD-10-CM

## 2017-03-10 DIAGNOSIS — R2689 Other abnormalities of gait and mobility: Secondary | ICD-10-CM

## 2017-03-10 DIAGNOSIS — R1310 Dysphagia, unspecified: Secondary | ICD-10-CM

## 2017-03-10 DIAGNOSIS — R269 Unspecified abnormalities of gait and mobility: Secondary | ICD-10-CM

## 2017-03-10 DIAGNOSIS — G9589 Other specified diseases of spinal cord: Secondary | ICD-10-CM

## 2017-03-10 DIAGNOSIS — Z8701 Personal history of pneumonia (recurrent): Secondary | ICD-10-CM | POA: Diagnosis not present

## 2017-03-10 DIAGNOSIS — Z7184 Encounter for health counseling related to travel: Secondary | ICD-10-CM

## 2017-03-10 DIAGNOSIS — R059 Cough, unspecified: Secondary | ICD-10-CM

## 2017-03-10 NOTE — Patient Instructions (Signed)
It was a pleasure to see you today Todd [name].  I am placing a new referral for home health services today. Your strength is good but your balance is very poor. Hopefully we can maximize opportunities to work on mobility for the rest of this month.  I will wait for another call requesting refill to send medications to your pharmacy. It is reasonable to take both the tussionex plus guaifenesin alone for cough and congestion.
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
## 2017-03-12 DIAGNOSIS — Z7184 Encounter for health counseling related to travel: Secondary | ICD-10-CM | POA: Insufficient documentation

## 2017-03-12 NOTE — Assessment & Plan Note (Signed)
He was admitted to the hospital earlier this year and at discharge placed at [facility] for rehab. He has had little progress so far. He is mobile predominantly with a wheelchair and cannot walk unaided. He is able to stand for dressing and bathing with the use of a handhold. His wife is interested in resuming home health therapy with [facility] who had been working with him previously. His debility is multifactorial related to his remote TBI, his lumbar spinal disease, and also deconditioning after hospitalization and SNF stay. Assessment Gait instability limiting mobility and some ADLs Plan Referral to home health PT/OT/SLP today

## 2017-03-12 NOTE — Assessment & Plan Note (Signed)
He continues to have a wet cough daily. This is also often provoked with eating. This is a chronic problem and associated with multiple aspiration pneumonias. Most of the time it is nonproductive. He feels significant symptom benefit when taking a low dose of hydrocodone-chlorpheniramine at night for cough suppression otherwise just uses mucinex. He is having no unusual shortness of breath today. Assessment Chronic cough, also associated with dysphagia Plan Recommended we could continue low dose cough suppressant medication PRN for resting when he is not eating or having signs of respiratory distress

## 2017-03-12 NOTE — Assessment & Plan Note (Addendum)
He and his wife are planning to move out to [place] later this year and remain there for several months. They will be seeing a [facility] provider as a primary care contact during that time. They wish to retain Dr. Dareen Piano as PCP and anticipate returning here with resumption of care sometime next year. I recommended we could provide medication refills for them to pick up before leaving and should call here as needed. This has already been by sending them to the [place]. After moving to [place] they will need to try having any new prescriptions filled by their [facility] doctor there.

## 2017-03-12 NOTE — Progress Notes (Signed)
Internal Medicine Clinic Attending  Case discussed with Dr. Rice at the time of the visit.  We reviewed the resident's history and exam and pertinent patient test results.  I agree with the assessment, diagnosis, and plan of care documented in the resident's note.  

## 2017-03-16 ENCOUNTER — Telehealth: Payer: Self-pay | Admitting: *Deleted

## 2017-03-16 NOTE — Telephone Encounter (Signed)
Call from [place], [facility] - stated she received referral for PT/OT/Speech. Stated they will not be able to take him back; need more advanced care; recommend Adult Placement Services for placement. Thanks

## 2017-03-17 ENCOUNTER — Telehealth: Payer: Self-pay | Admitting: Internal Medicine

## 2017-03-17 NOTE — Telephone Encounter (Signed)
I thought he was going to [place]. I am not sure they would want to place him here if they are going to [place]. Let me know what they want to do.

## 2017-03-17 NOTE — Telephone Encounter (Signed)
   Reason for call:   I received a call from Mr. Derian Dimalanta Portocarrero's wife at 1:45 PM indicating that Mr. 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 Requesting an antibiotic.    Pertinent Data:   Patient is incontinent at baseline, wears diapers, currently afebrile and asymptomatic   Follows with a dermatology for recurrent sebaceous cysts   Assessment / Plan / Recommendations:   Reassured patient's wife that sebaceous cysts are not a sign of UTI. Instructed her to have him follow up in [facility] next week for evaluation.   Advised her to present to the ER sooner if patient develops urinary symptoms, becomes febrile, or develops AMS.  As always, pt is advised that if symptoms worsen or new symptoms arise, they should go to an urgent care facility or to to ER for further evaluation.   Velna Ochs, MD   03/17/2017, 1:59 PM

## 2017-03-24 ENCOUNTER — Telehealth: Payer: Self-pay | Admitting: Neurology

## 2017-03-24 NOTE — Telephone Encounter (Signed)
Thank you :)

## 2017-03-24 NOTE — Telephone Encounter (Signed)
Pt's spouse called and thinks his medication needs to be adjusted and would like a call back regarding this

## 2017-03-24 NOTE — Telephone Encounter (Signed)
Spoke with Dagoberto Ligas @ [facility] again Regarding this [facility] Referral.  Patient will be allowed 1 more [facility] Referral until he moves to [place].  Per Nira Conn she has already sent Melissa @ [facility] this referral previously about arranging care until he leaves.  Will Check back next week as to when [facility] will begin for this patient.

## 2017-03-25 ENCOUNTER — Telehealth: Payer: Self-pay | Admitting: Internal Medicine

## 2017-03-25 DIAGNOSIS — Z79891 Long term (current) use of opiate analgesic: Secondary | ICD-10-CM | POA: Diagnosis not present

## 2017-03-25 DIAGNOSIS — J449 Chronic obstructive pulmonary disease, unspecified: Secondary | ICD-10-CM | POA: Diagnosis not present

## 2017-03-25 DIAGNOSIS — R569 Unspecified convulsions: Secondary | ICD-10-CM | POA: Diagnosis not present

## 2017-03-25 DIAGNOSIS — K219 Gastro-esophageal reflux disease without esophagitis: Secondary | ICD-10-CM | POA: Diagnosis not present

## 2017-03-25 DIAGNOSIS — M48061 Spinal stenosis, lumbar region without neurogenic claudication: Secondary | ICD-10-CM | POA: Diagnosis not present

## 2017-03-25 DIAGNOSIS — S062X0D Diffuse traumatic brain injury without loss of consciousness, subsequent encounter: Secondary | ICD-10-CM | POA: Diagnosis not present

## 2017-03-25 DIAGNOSIS — Z7982 Long term (current) use of aspirin: Secondary | ICD-10-CM | POA: Diagnosis not present

## 2017-03-25 DIAGNOSIS — R1310 Dysphagia, unspecified: Secondary | ICD-10-CM | POA: Diagnosis not present

## 2017-03-25 DIAGNOSIS — R2689 Other abnormalities of gait and mobility: Secondary | ICD-10-CM | POA: Diagnosis not present

## 2017-03-25 NOTE — Telephone Encounter (Signed)
Spoke with pt's wife who states that pt has had 4 more spells.  2 last week and 2 again this week.  He would stare off into space.  Wife describes this as "what you would see during a TIA".  She is concerned that his medications need to be adjusted.  States that she did not call last week, thinking it was just a one or two time thing.  With it happening again this week, she is getting worried.  Please advise.

## 2017-03-25 NOTE — Telephone Encounter (Signed)
What dose of lamictal is he on?  100mg  bid?   If so, you can increase to 125 mg bid.  It looks like he told Dr. Delice Lesch he was moving out of state last visit and therefore has no f/u.  If he has moved, we cannot continue to provide care.  If he is here, he needs a f/u appointment.

## 2017-03-25 NOTE — Telephone Encounter (Signed)
Todd [name] speech therapy is requesting orders once a week for the next 4 weeks

## 2017-03-25 NOTE — Telephone Encounter (Signed)
LMOM relaying message and asking for a return call.

## 2017-03-25 NOTE — Telephone Encounter (Signed)
Patient does require an in person assessment as this complaint can not be adequately addressed over the phone.  If they are unwilling to go to the ED than we will have to see him in the [facility] on Monday if the symptoms persist.

## 2017-03-25 NOTE — Telephone Encounter (Signed)
Wife/ caretaker calls and states pt is telling her that his limbs are "numb", he is able to move all limbs at command, is able to feel a"pinch" from his wife. No shortness of breath, chest pain, N&V, h/a, no increased weakness. He does state he is more tired than normal. He has now told wife this has happened several times recently and he just has not said anything about it.  Right after this conversation w/ wife I got message from speech therapist that was in home today for orders. She has worked with him for quite awhile and states he looked like he had been having some seizure activity which she said pt told her he had been having "some little spells" lately which means seizures. She states she was present when he had lunch and he would not always get the spoon or fork to his mouth but would chew or suck on the straw that was not in his mouth. She states he told her he was very tired. Wife did not relate these things and dont think he has told her from speaking w/ the speech therapist. Pt is resting at the moment. Spouse did not want to go to ED but would if necessary and if things become worse she is agreeable to call 911,  we decided triage would call her back at lunch tomorrow and schedule appt for Monday in [facility]. Spouse has placed a call to neuro for eval of increasing lamictal. Please advise

## 2017-03-26 ENCOUNTER — Other Ambulatory Visit: Payer: Self-pay

## 2017-03-26 MED ORDER — LAMOTRIGINE 25 MG PO TABS: 25.0000 mg | ORAL_TABLET | Freq: Two times a day (BID) | ORAL | 3 refills | Status: DC

## 2017-03-26 MED ORDER — LAMOTRIGINE 100 MG PO TABS: ORAL_TABLET | ORAL | 6 refills | Status: DC

## 2017-03-26 NOTE — Telephone Encounter (Signed)
Tried to call pt's wife, no answer, vmail at one ph, busy at other

## 2017-03-26 NOTE — Telephone Encounter (Signed)
Have tried once again to call no answer at this calling

## 2017-03-26 NOTE — Telephone Encounter (Signed)
Pt's wife returned my call.  I relayed message below.  Pt's wife states that they will be heading to [facility] sometime next week, but would like to keep Dr. Delice Lesch as pt's Neurologist as she is not sure how long they will be out there (contract work, states could be 6 months, could be 12 months)  She will schedule follow up once she knows when they will be back in the area.   Rx for 60 day supply of Lamictal 25mg  and Lamictal 100mg  sent to [facility] and a copy of Rx's printed and will be faxed to [facility].

## 2017-03-29 DIAGNOSIS — R2689 Other abnormalities of gait and mobility: Secondary | ICD-10-CM | POA: Diagnosis not present

## 2017-03-29 DIAGNOSIS — R1310 Dysphagia, unspecified: Secondary | ICD-10-CM | POA: Diagnosis not present

## 2017-03-29 DIAGNOSIS — Z79891 Long term (current) use of opiate analgesic: Secondary | ICD-10-CM | POA: Diagnosis not present

## 2017-03-29 DIAGNOSIS — Z7982 Long term (current) use of aspirin: Secondary | ICD-10-CM | POA: Diagnosis not present

## 2017-03-29 DIAGNOSIS — J449 Chronic obstructive pulmonary disease, unspecified: Secondary | ICD-10-CM | POA: Diagnosis not present

## 2017-03-29 DIAGNOSIS — M48061 Spinal stenosis, lumbar region without neurogenic claudication: Secondary | ICD-10-CM | POA: Diagnosis not present

## 2017-03-29 DIAGNOSIS — R569 Unspecified convulsions: Secondary | ICD-10-CM | POA: Diagnosis not present

## 2017-03-29 DIAGNOSIS — K219 Gastro-esophageal reflux disease without esophagitis: Secondary | ICD-10-CM | POA: Diagnosis not present

## 2017-03-29 DIAGNOSIS — S062X0D Diffuse traumatic brain injury without loss of consciousness, subsequent encounter: Secondary | ICD-10-CM | POA: Diagnosis not present

## 2017-03-29 NOTE — Telephone Encounter (Signed)
Lm again, closing note

## 2017-03-29 NOTE — Telephone Encounter (Signed)
Lm again for rtc, spoke w/ dr Dareen Piano, will advise contact with neuro

## 2017-03-29 NOTE — Telephone Encounter (Signed)
Any update on this?

## 2017-03-30 ENCOUNTER — Telehealth: Payer: Self-pay | Admitting: *Deleted

## 2017-03-30 DIAGNOSIS — J449 Chronic obstructive pulmonary disease, unspecified: Secondary | ICD-10-CM | POA: Diagnosis not present

## 2017-03-30 DIAGNOSIS — M48061 Spinal stenosis, lumbar region without neurogenic claudication: Secondary | ICD-10-CM | POA: Diagnosis not present

## 2017-03-30 DIAGNOSIS — R2689 Other abnormalities of gait and mobility: Secondary | ICD-10-CM | POA: Diagnosis not present

## 2017-03-30 DIAGNOSIS — K219 Gastro-esophageal reflux disease without esophagitis: Secondary | ICD-10-CM | POA: Diagnosis not present

## 2017-03-30 DIAGNOSIS — S062X0D Diffuse traumatic brain injury without loss of consciousness, subsequent encounter: Secondary | ICD-10-CM | POA: Diagnosis not present

## 2017-03-30 DIAGNOSIS — R569 Unspecified convulsions: Secondary | ICD-10-CM | POA: Diagnosis not present

## 2017-03-30 DIAGNOSIS — R1310 Dysphagia, unspecified: Secondary | ICD-10-CM | POA: Diagnosis not present

## 2017-03-30 DIAGNOSIS — Z79891 Long term (current) use of opiate analgesic: Secondary | ICD-10-CM | POA: Diagnosis not present

## 2017-03-30 DIAGNOSIS — Z7982 Long term (current) use of aspirin: Secondary | ICD-10-CM | POA: Diagnosis not present

## 2017-03-30 NOTE — Telephone Encounter (Signed)
Call from PT to extend visits for 4 weeks twice weekly until pt moves to [place] on Apr 30, 2017. VO given. PT states pt's lamictal was increased by neuro recently due to very slight seizure activity Also PT states pt has a moderately large sebaceous cyst on his back at shoulder, he was not able to see the area because it was covered in a dressing and wife did not want the dressing removed. Just an [place]

## 2017-03-31 DIAGNOSIS — J449 Chronic obstructive pulmonary disease, unspecified: Secondary | ICD-10-CM | POA: Diagnosis not present

## 2017-03-31 DIAGNOSIS — K219 Gastro-esophageal reflux disease without esophagitis: Secondary | ICD-10-CM | POA: Diagnosis not present

## 2017-03-31 DIAGNOSIS — R1310 Dysphagia, unspecified: Secondary | ICD-10-CM | POA: Diagnosis not present

## 2017-03-31 DIAGNOSIS — R569 Unspecified convulsions: Secondary | ICD-10-CM | POA: Diagnosis not present

## 2017-03-31 DIAGNOSIS — S062X0D Diffuse traumatic brain injury without loss of consciousness, subsequent encounter: Secondary | ICD-10-CM | POA: Diagnosis not present

## 2017-03-31 DIAGNOSIS — M48061 Spinal stenosis, lumbar region without neurogenic claudication: Secondary | ICD-10-CM | POA: Diagnosis not present

## 2017-03-31 DIAGNOSIS — Z7982 Long term (current) use of aspirin: Secondary | ICD-10-CM | POA: Diagnosis not present

## 2017-03-31 DIAGNOSIS — Z79891 Long term (current) use of opiate analgesic: Secondary | ICD-10-CM | POA: Diagnosis not present

## 2017-03-31 DIAGNOSIS — R2689 Other abnormalities of gait and mobility: Secondary | ICD-10-CM | POA: Diagnosis not present

## 2017-03-31 NOTE — Telephone Encounter (Signed)
Thank you for the update Bonnita Nasuti. I agree with VO for PT.

## 2017-04-02 ENCOUNTER — Other Ambulatory Visit: Payer: Self-pay | Admitting: Internal Medicine

## 2017-04-02 DIAGNOSIS — R1310 Dysphagia, unspecified: Secondary | ICD-10-CM | POA: Diagnosis not present

## 2017-04-02 DIAGNOSIS — R569 Unspecified convulsions: Secondary | ICD-10-CM | POA: Diagnosis not present

## 2017-04-02 DIAGNOSIS — Z7982 Long term (current) use of aspirin: Secondary | ICD-10-CM | POA: Diagnosis not present

## 2017-04-02 DIAGNOSIS — K219 Gastro-esophageal reflux disease without esophagitis: Secondary | ICD-10-CM | POA: Diagnosis not present

## 2017-04-02 DIAGNOSIS — R2689 Other abnormalities of gait and mobility: Secondary | ICD-10-CM | POA: Diagnosis not present

## 2017-04-02 DIAGNOSIS — Z79891 Long term (current) use of opiate analgesic: Secondary | ICD-10-CM | POA: Diagnosis not present

## 2017-04-02 DIAGNOSIS — S062X0D Diffuse traumatic brain injury without loss of consciousness, subsequent encounter: Secondary | ICD-10-CM | POA: Diagnosis not present

## 2017-04-02 DIAGNOSIS — M48061 Spinal stenosis, lumbar region without neurogenic claudication: Secondary | ICD-10-CM | POA: Diagnosis not present

## 2017-04-02 DIAGNOSIS — J449 Chronic obstructive pulmonary disease, unspecified: Secondary | ICD-10-CM | POA: Diagnosis not present

## 2017-04-02 MED ORDER — HYDROCOD POLST-CPM POLST ER 10-8 MG/5ML PO SUER: 5.0000 mL | Freq: Two times a day (BID) | ORAL | 0 refills | Status: DC | PRN

## 2017-04-02 MED ORDER — GUAIFENESIN 100 MG/5ML PO SYRP: 30.0000 mL | ORAL_SOLUTION | Freq: Three times a day (TID) | ORAL | 1 refills | Status: AC | PRN

## 2017-04-02 NOTE — Telephone Encounter (Signed)
He got 30 day supply of the guaifenesin 30 days ago. How bad is this cough? New? Does he need seen?

## 2017-04-02 NOTE — Telephone Encounter (Signed)
This is an ongoing cough, pt's wife calls sporadically for this, it is nothing from his normal

## 2017-04-02 NOTE — Telephone Encounter (Signed)
Patient requesting refill on cough medicine

## 2017-04-02 NOTE — Telephone Encounter (Signed)
faxed

## 2017-04-06 DIAGNOSIS — Z7982 Long term (current) use of aspirin: Secondary | ICD-10-CM | POA: Diagnosis not present

## 2017-04-06 DIAGNOSIS — R569 Unspecified convulsions: Secondary | ICD-10-CM | POA: Diagnosis not present

## 2017-04-06 DIAGNOSIS — R2689 Other abnormalities of gait and mobility: Secondary | ICD-10-CM | POA: Diagnosis not present

## 2017-04-06 DIAGNOSIS — Z79891 Long term (current) use of opiate analgesic: Secondary | ICD-10-CM | POA: Diagnosis not present

## 2017-04-06 DIAGNOSIS — M48061 Spinal stenosis, lumbar region without neurogenic claudication: Secondary | ICD-10-CM | POA: Diagnosis not present

## 2017-04-06 DIAGNOSIS — R1310 Dysphagia, unspecified: Secondary | ICD-10-CM | POA: Diagnosis not present

## 2017-04-06 DIAGNOSIS — K219 Gastro-esophageal reflux disease without esophagitis: Secondary | ICD-10-CM | POA: Diagnosis not present

## 2017-04-06 DIAGNOSIS — S062X0D Diffuse traumatic brain injury without loss of consciousness, subsequent encounter: Secondary | ICD-10-CM | POA: Diagnosis not present

## 2017-04-06 DIAGNOSIS — J449 Chronic obstructive pulmonary disease, unspecified: Secondary | ICD-10-CM | POA: Diagnosis not present

## 2017-04-07 ENCOUNTER — Telehealth: Payer: Self-pay | Admitting: *Deleted

## 2017-04-07 ENCOUNTER — Other Ambulatory Visit: Payer: Self-pay | Admitting: Internal Medicine

## 2017-04-07 ENCOUNTER — Telehealth: Payer: Self-pay | Admitting: Neurology

## 2017-04-07 DIAGNOSIS — S062X0D Diffuse traumatic brain injury without loss of consciousness, subsequent encounter: Secondary | ICD-10-CM | POA: Diagnosis not present

## 2017-04-07 DIAGNOSIS — R569 Unspecified convulsions: Secondary | ICD-10-CM | POA: Diagnosis not present

## 2017-04-07 DIAGNOSIS — K219 Gastro-esophageal reflux disease without esophagitis: Secondary | ICD-10-CM | POA: Diagnosis not present

## 2017-04-07 DIAGNOSIS — M48061 Spinal stenosis, lumbar region without neurogenic claudication: Secondary | ICD-10-CM | POA: Diagnosis not present

## 2017-04-07 DIAGNOSIS — R1310 Dysphagia, unspecified: Secondary | ICD-10-CM | POA: Diagnosis not present

## 2017-04-07 DIAGNOSIS — Z7982 Long term (current) use of aspirin: Secondary | ICD-10-CM | POA: Diagnosis not present

## 2017-04-07 DIAGNOSIS — J449 Chronic obstructive pulmonary disease, unspecified: Secondary | ICD-10-CM | POA: Diagnosis not present

## 2017-04-07 DIAGNOSIS — Z79891 Long term (current) use of opiate analgesic: Secondary | ICD-10-CM | POA: Diagnosis not present

## 2017-04-07 DIAGNOSIS — R2689 Other abnormalities of gait and mobility: Secondary | ICD-10-CM | POA: Diagnosis not present

## 2017-04-07 MED ORDER — HYDROCOD POLST-CPM POLST ER 10-8 MG/5ML PO SUER: 5.0000 mL | Freq: Two times a day (BID) | ORAL | 0 refills | Status: AC | PRN

## 2017-04-07 NOTE — Telephone Encounter (Signed)
Pt's spouse left a message asking for a call back regarding his lamictal

## 2017-04-07 NOTE — Telephone Encounter (Signed)
Todd [name], [place] PT, calls to say pt had a "spell" earlier today, he states spouse/caregiver called neuro office and left message w/ staff about the "spell" and ask what to do. Note from neuro states only "does lamictal need to be changed" as in increasing. PT states pt's bp now is 150/100 in both L and R arm. He is offered appt for tomorrow and suggested pt may be seen in ED. Spouse/ caregiver comes on phone and states she cannot bring pt in to clinic nor to ED and she feels he is "fine". She states she thinks he needs a higher dose of lamictal and is hoping dr Delice Lesch adjusts the dose again. She does at this time ask about his tussionex pennkinetic er and is informed it was faxed 12/7 to pharm, she also states she will call 911 if she feels pt needs emergent care. Call is ended at this time

## 2017-04-08 DIAGNOSIS — R2689 Other abnormalities of gait and mobility: Secondary | ICD-10-CM | POA: Diagnosis not present

## 2017-04-08 DIAGNOSIS — S062X0D Diffuse traumatic brain injury without loss of consciousness, subsequent encounter: Secondary | ICD-10-CM | POA: Diagnosis not present

## 2017-04-08 DIAGNOSIS — M48061 Spinal stenosis, lumbar region without neurogenic claudication: Secondary | ICD-10-CM | POA: Diagnosis not present

## 2017-04-08 DIAGNOSIS — K219 Gastro-esophageal reflux disease without esophagitis: Secondary | ICD-10-CM | POA: Diagnosis not present

## 2017-04-08 DIAGNOSIS — Z7982 Long term (current) use of aspirin: Secondary | ICD-10-CM | POA: Diagnosis not present

## 2017-04-08 DIAGNOSIS — J449 Chronic obstructive pulmonary disease, unspecified: Secondary | ICD-10-CM | POA: Diagnosis not present

## 2017-04-08 DIAGNOSIS — R569 Unspecified convulsions: Secondary | ICD-10-CM | POA: Diagnosis not present

## 2017-04-08 DIAGNOSIS — Z79891 Long term (current) use of opiate analgesic: Secondary | ICD-10-CM | POA: Diagnosis not present

## 2017-04-08 DIAGNOSIS — R1310 Dysphagia, unspecified: Secondary | ICD-10-CM | POA: Diagnosis not present

## 2017-04-09 ENCOUNTER — Telehealth: Payer: Self-pay | Admitting: *Deleted

## 2017-04-09 DIAGNOSIS — Z7982 Long term (current) use of aspirin: Secondary | ICD-10-CM | POA: Diagnosis not present

## 2017-04-09 DIAGNOSIS — M48061 Spinal stenosis, lumbar region without neurogenic claudication: Secondary | ICD-10-CM | POA: Diagnosis not present

## 2017-04-09 DIAGNOSIS — Z79891 Long term (current) use of opiate analgesic: Secondary | ICD-10-CM | POA: Diagnosis not present

## 2017-04-09 DIAGNOSIS — K219 Gastro-esophageal reflux disease without esophagitis: Secondary | ICD-10-CM | POA: Diagnosis not present

## 2017-04-09 DIAGNOSIS — R569 Unspecified convulsions: Secondary | ICD-10-CM | POA: Diagnosis not present

## 2017-04-09 DIAGNOSIS — R2689 Other abnormalities of gait and mobility: Secondary | ICD-10-CM | POA: Diagnosis not present

## 2017-04-09 DIAGNOSIS — J449 Chronic obstructive pulmonary disease, unspecified: Secondary | ICD-10-CM | POA: Diagnosis not present

## 2017-04-09 DIAGNOSIS — S062X0D Diffuse traumatic brain injury without loss of consciousness, subsequent encounter: Secondary | ICD-10-CM | POA: Diagnosis not present

## 2017-04-09 DIAGNOSIS — R1310 Dysphagia, unspecified: Secondary | ICD-10-CM | POA: Diagnosis not present

## 2017-04-09 NOTE — Telephone Encounter (Signed)
Increase Lamotrigine to 150mg  BID. This can be done by doing the Lamotrigine 100mg  tabs 1.5 tab BID (instead of taking 100mg  and 25mg  tabs separately). Pls let her know that the Lamotrigine was only increased recently, we don't typically keep increasing after each seizure, but since it was a small increase the last time, we will do 150mg  BID and see how he does over the next month or so.   Thanks

## 2017-04-09 NOTE — Telephone Encounter (Addendum)
Pt's wife called back.  I relayed message below.  She was irate about "the practices of our office" and the fact that she had not received a return call. (I was dialing her number when she called back) I apologized, letting her know that since our office was closed on Monday and Tuesday, as well as opening late on Wednesday, that we were behind on some things.  Wife expressed that this was unacceptable, and that she shouldn't have to "chase me down" to get a return call.  I again apologized letting her know that I was just about to return her call, since I had a response from Dr. Delice Lesch.  Wife asked why should pt be given 1.5 tablets instead of (2) 25mg  tablets...stating that she just picked up the 25mg , and is almost out of the 100mg .  I let her know that she can give meds to pt however she would like, whatever is easiest for her.  I also let her know that she should still have 5 refills of 100 mg available for pick up.  Wife then hung up on me.

## 2017-04-09 NOTE — Telephone Encounter (Signed)
[name] wife called stating that patient is still having seizures even after the lamictal was increased to 125 mg bid.  Can we increase it again?

## 2017-04-12 ENCOUNTER — Telehealth: Payer: Self-pay | Admitting: Internal Medicine

## 2017-04-12 DIAGNOSIS — R569 Unspecified convulsions: Secondary | ICD-10-CM | POA: Diagnosis not present

## 2017-04-12 DIAGNOSIS — Z79891 Long term (current) use of opiate analgesic: Secondary | ICD-10-CM | POA: Diagnosis not present

## 2017-04-12 DIAGNOSIS — K219 Gastro-esophageal reflux disease without esophagitis: Secondary | ICD-10-CM | POA: Diagnosis not present

## 2017-04-12 DIAGNOSIS — R2689 Other abnormalities of gait and mobility: Secondary | ICD-10-CM | POA: Diagnosis not present

## 2017-04-12 DIAGNOSIS — J449 Chronic obstructive pulmonary disease, unspecified: Secondary | ICD-10-CM | POA: Diagnosis not present

## 2017-04-12 DIAGNOSIS — S062X0D Diffuse traumatic brain injury without loss of consciousness, subsequent encounter: Secondary | ICD-10-CM | POA: Diagnosis not present

## 2017-04-12 DIAGNOSIS — R1310 Dysphagia, unspecified: Secondary | ICD-10-CM | POA: Diagnosis not present

## 2017-04-12 DIAGNOSIS — M48061 Spinal stenosis, lumbar region without neurogenic claudication: Secondary | ICD-10-CM | POA: Diagnosis not present

## 2017-04-12 DIAGNOSIS — Z7982 Long term (current) use of aspirin: Secondary | ICD-10-CM | POA: Diagnosis not present

## 2017-04-12 NOTE — Telephone Encounter (Signed)
[name], requesting verbal order for one additional speech therapy to address swallowing.

## 2017-04-12 NOTE — Telephone Encounter (Signed)
VO given for 1xwk x 2 week for swallowing, do you agree?

## 2017-04-13 ENCOUNTER — Encounter: Payer: Self-pay | Admitting: Neurology

## 2017-04-13 ENCOUNTER — Other Ambulatory Visit: Payer: Self-pay

## 2017-04-13 DIAGNOSIS — Z7982 Long term (current) use of aspirin: Secondary | ICD-10-CM | POA: Diagnosis not present

## 2017-04-13 DIAGNOSIS — Z79891 Long term (current) use of opiate analgesic: Secondary | ICD-10-CM | POA: Diagnosis not present

## 2017-04-13 DIAGNOSIS — M48061 Spinal stenosis, lumbar region without neurogenic claudication: Secondary | ICD-10-CM | POA: Diagnosis not present

## 2017-04-13 DIAGNOSIS — S062X0D Diffuse traumatic brain injury without loss of consciousness, subsequent encounter: Secondary | ICD-10-CM | POA: Diagnosis not present

## 2017-04-13 DIAGNOSIS — R569 Unspecified convulsions: Secondary | ICD-10-CM | POA: Diagnosis not present

## 2017-04-13 DIAGNOSIS — J449 Chronic obstructive pulmonary disease, unspecified: Secondary | ICD-10-CM | POA: Diagnosis not present

## 2017-04-13 DIAGNOSIS — R1310 Dysphagia, unspecified: Secondary | ICD-10-CM | POA: Diagnosis not present

## 2017-04-13 DIAGNOSIS — R2689 Other abnormalities of gait and mobility: Secondary | ICD-10-CM | POA: Diagnosis not present

## 2017-04-13 DIAGNOSIS — K219 Gastro-esophageal reflux disease without esophagitis: Secondary | ICD-10-CM | POA: Diagnosis not present

## 2017-04-13 MED ORDER — LAMOTRIGINE 25 MG PO TABS: ORAL_TABLET | ORAL | 3 refills | Status: DC

## 2017-04-13 NOTE — Telephone Encounter (Signed)
yes

## 2017-04-14 DIAGNOSIS — Z79891 Long term (current) use of opiate analgesic: Secondary | ICD-10-CM | POA: Diagnosis not present

## 2017-04-14 DIAGNOSIS — R1310 Dysphagia, unspecified: Secondary | ICD-10-CM | POA: Diagnosis not present

## 2017-04-14 DIAGNOSIS — Z7982 Long term (current) use of aspirin: Secondary | ICD-10-CM | POA: Diagnosis not present

## 2017-04-14 DIAGNOSIS — R569 Unspecified convulsions: Secondary | ICD-10-CM | POA: Diagnosis not present

## 2017-04-14 DIAGNOSIS — K219 Gastro-esophageal reflux disease without esophagitis: Secondary | ICD-10-CM | POA: Diagnosis not present

## 2017-04-14 DIAGNOSIS — M48061 Spinal stenosis, lumbar region without neurogenic claudication: Secondary | ICD-10-CM | POA: Diagnosis not present

## 2017-04-14 DIAGNOSIS — S062X0D Diffuse traumatic brain injury without loss of consciousness, subsequent encounter: Secondary | ICD-10-CM | POA: Diagnosis not present

## 2017-04-14 DIAGNOSIS — R2689 Other abnormalities of gait and mobility: Secondary | ICD-10-CM | POA: Diagnosis not present

## 2017-04-14 DIAGNOSIS — J449 Chronic obstructive pulmonary disease, unspecified: Secondary | ICD-10-CM | POA: Diagnosis not present

## 2017-04-15 DIAGNOSIS — R1310 Dysphagia, unspecified: Secondary | ICD-10-CM | POA: Diagnosis not present

## 2017-04-15 DIAGNOSIS — J449 Chronic obstructive pulmonary disease, unspecified: Secondary | ICD-10-CM | POA: Diagnosis not present

## 2017-04-15 DIAGNOSIS — M48061 Spinal stenosis, lumbar region without neurogenic claudication: Secondary | ICD-10-CM | POA: Diagnosis not present

## 2017-04-15 DIAGNOSIS — S062X0D Diffuse traumatic brain injury without loss of consciousness, subsequent encounter: Secondary | ICD-10-CM | POA: Diagnosis not present

## 2017-04-15 DIAGNOSIS — Z79891 Long term (current) use of opiate analgesic: Secondary | ICD-10-CM | POA: Diagnosis not present

## 2017-04-15 DIAGNOSIS — R2689 Other abnormalities of gait and mobility: Secondary | ICD-10-CM | POA: Diagnosis not present

## 2017-04-15 DIAGNOSIS — Z7982 Long term (current) use of aspirin: Secondary | ICD-10-CM | POA: Diagnosis not present

## 2017-04-15 DIAGNOSIS — R569 Unspecified convulsions: Secondary | ICD-10-CM | POA: Diagnosis not present

## 2017-04-15 DIAGNOSIS — K219 Gastro-esophageal reflux disease without esophagitis: Secondary | ICD-10-CM | POA: Diagnosis not present

## 2017-04-22 ENCOUNTER — Telehealth: Payer: Self-pay | Admitting: Internal Medicine

## 2017-04-22 DIAGNOSIS — R1310 Dysphagia, unspecified: Secondary | ICD-10-CM | POA: Diagnosis not present

## 2017-04-22 DIAGNOSIS — M48061 Spinal stenosis, lumbar region without neurogenic claudication: Secondary | ICD-10-CM | POA: Diagnosis not present

## 2017-04-22 DIAGNOSIS — Z79891 Long term (current) use of opiate analgesic: Secondary | ICD-10-CM | POA: Diagnosis not present

## 2017-04-22 DIAGNOSIS — S062X0D Diffuse traumatic brain injury without loss of consciousness, subsequent encounter: Secondary | ICD-10-CM | POA: Diagnosis not present

## 2017-04-22 DIAGNOSIS — Z7982 Long term (current) use of aspirin: Secondary | ICD-10-CM | POA: Diagnosis not present

## 2017-04-22 DIAGNOSIS — J449 Chronic obstructive pulmonary disease, unspecified: Secondary | ICD-10-CM | POA: Diagnosis not present

## 2017-04-22 DIAGNOSIS — R569 Unspecified convulsions: Secondary | ICD-10-CM | POA: Diagnosis not present

## 2017-04-22 DIAGNOSIS — R2689 Other abnormalities of gait and mobility: Secondary | ICD-10-CM | POA: Diagnosis not present

## 2017-04-22 DIAGNOSIS — K219 Gastro-esophageal reflux disease without esophagitis: Secondary | ICD-10-CM | POA: Diagnosis not present

## 2017-04-22 NOTE — Telephone Encounter (Signed)
   Reason for call:   I received a call from Mr. ETHON WYMER at 5:38 PM indicating that he had a fall on christmas eve and fell again this evening and now he is having pain in his right knee.   Pertinent Data:   Patients wife says that he fell while working with PT today and he hit his knee and now it is swollen and painful.   His wife gave him an oxycodone pill and now he is able to walk with assistance but this is limited by pain.   Asking whether she should bring him into the emergency room tomorrow   Assessment / Plan / Recommendations:   Advised RICE overnight and tylenol   Advised that she can call the clinic in the morning if the pain continues   As always, pt is advised that if symptoms worsen or new symptoms arise, they should go to an urgent care facility or to to ER for further evaluation.   Ledell Noss, MD   04/22/2017, 5:37 PM

## 2017-04-23 ENCOUNTER — Encounter (HOSPITAL_COMMUNITY): Payer: Self-pay | Admitting: Emergency Medicine

## 2017-04-23 ENCOUNTER — Observation Stay (HOSPITAL_COMMUNITY)
Admission: EM | Admit: 2017-04-23 | Discharge: 2017-04-30 | Disposition: A | Discharge status: Skilled Nursing Facility | Payer: Medicare HMO | Attending: Internal Medicine | Admitting: Internal Medicine

## 2017-04-23 ENCOUNTER — Emergency Department (HOSPITAL_COMMUNITY): Payer: Medicare HMO

## 2017-04-23 ENCOUNTER — Other Ambulatory Visit: Payer: Self-pay

## 2017-04-23 ENCOUNTER — Telehealth: Payer: Self-pay

## 2017-04-23 ENCOUNTER — Telehealth: Payer: Self-pay | Admitting: *Deleted

## 2017-04-23 DIAGNOSIS — W1830XA Fall on same level, unspecified, initial encounter: Secondary | ICD-10-CM | POA: Diagnosis not present

## 2017-04-23 DIAGNOSIS — S2241XA Multiple fractures of ribs, right side, initial encounter for closed fracture: Secondary | ICD-10-CM | POA: Diagnosis not present

## 2017-04-23 DIAGNOSIS — S82891A Other fracture of right lower leg, initial encounter for closed fracture: Secondary | ICD-10-CM

## 2017-04-23 DIAGNOSIS — S8291XA Unspecified fracture of right lower leg, initial encounter for closed fracture: Secondary | ICD-10-CM | POA: Diagnosis not present

## 2017-04-23 DIAGNOSIS — G40909 Epilepsy, unspecified, not intractable, without status epilepticus: Secondary | ICD-10-CM | POA: Insufficient documentation

## 2017-04-23 DIAGNOSIS — Z79899 Other long term (current) drug therapy: Secondary | ICD-10-CM | POA: Diagnosis not present

## 2017-04-23 DIAGNOSIS — S82841A Displaced bimalleolar fracture of right lower leg, initial encounter for closed fracture: Secondary | ICD-10-CM | POA: Diagnosis not present

## 2017-04-23 DIAGNOSIS — R0902 Hypoxemia: Secondary | ICD-10-CM | POA: Diagnosis not present

## 2017-04-23 DIAGNOSIS — M25571 Pain in right ankle and joints of right foot: Secondary | ICD-10-CM | POA: Diagnosis not present

## 2017-04-23 DIAGNOSIS — Z7982 Long term (current) use of aspirin: Secondary | ICD-10-CM | POA: Insufficient documentation

## 2017-04-23 DIAGNOSIS — M6281 Muscle weakness (generalized): Secondary | ICD-10-CM | POA: Insufficient documentation

## 2017-04-23 DIAGNOSIS — Z87820 Personal history of traumatic brain injury: Secondary | ICD-10-CM | POA: Insufficient documentation

## 2017-04-23 DIAGNOSIS — Z9181 History of falling: Secondary | ICD-10-CM | POA: Diagnosis not present

## 2017-04-23 DIAGNOSIS — S82892A Other fracture of left lower leg, initial encounter for closed fracture: Secondary | ICD-10-CM | POA: Diagnosis present

## 2017-04-23 DIAGNOSIS — S2239XA Fracture of one rib, unspecified side, initial encounter for closed fracture: Secondary | ICD-10-CM | POA: Diagnosis present

## 2017-04-23 DIAGNOSIS — R0781 Pleurodynia: Secondary | ICD-10-CM | POA: Diagnosis not present

## 2017-04-23 DIAGNOSIS — M25561 Pain in right knee: Secondary | ICD-10-CM | POA: Diagnosis not present

## 2017-04-23 LAB — BASIC METABOLIC PANEL
Anion gap: 5 (ref 5–15)
BUN: 20 mg/dL (ref 6–20)
CO2: 24 mmol/L (ref 22–32)
CREATININE: 0.84 mg/dL (ref 0.61–1.24)
Calcium: 8.7 mg/dL — ABNORMAL LOW (ref 8.9–10.3)
Chloride: 112 mmol/L — ABNORMAL HIGH (ref 101–111)
Glucose, Bld: 99 mg/dL (ref 65–99)
Potassium: 4 mmol/L (ref 3.5–5.1)
SODIUM: 141 mmol/L (ref 135–145)

## 2017-04-23 LAB — CBC
HCT: 40.8 % (ref 39.0–52.0)
HEMOGLOBIN: 13.2 g/dL (ref 13.0–17.0)
MCH: 32.9 pg (ref 26.0–34.0)
MCHC: 32.4 g/dL (ref 30.0–36.0)
MCV: 101.7 fL — ABNORMAL HIGH (ref 78.0–100.0)
PLATELETS: 193 10*3/uL (ref 150–400)
RBC: 4.01 MIL/uL — AB (ref 4.22–5.81)
RDW: 13.8 % (ref 11.5–15.5)
WBC: 9.6 10*3/uL (ref 4.0–10.5)

## 2017-04-23 MED ORDER — HYDROMORPHONE HCL 1 MG/ML IJ SOLN
0.5000 mg | Freq: Once | INTRAMUSCULAR | Status: AC
  Administered 2017-04-23: 0.5 mg via INTRAVENOUS
  Filled 2017-04-23: qty 1

## 2017-04-23 MED ORDER — ONDANSETRON HCL 4 MG/2ML IJ SOLN
4.0000 mg | Freq: Once | INTRAMUSCULAR | Status: AC
  Administered 2017-04-23: 4 mg via INTRAVENOUS
  Filled 2017-04-23: qty 2

## 2017-04-23 MED ORDER — MORPHINE SULFATE (PF) 4 MG/ML IV SOLN
4.0000 mg | Freq: Once | INTRAVENOUS | Status: AC
  Administered 2017-04-23: 4 mg via INTRAVENOUS
  Filled 2017-04-23: qty 1

## 2017-04-23 NOTE — ED Notes (Addendum)
Two liters of oxygen [place] placed by RT. Belfi MD notified of minimal suction response and current oxygen status.

## 2017-04-23 NOTE — ED Notes (Signed)
Wife requests something for pain for pt. Steinl MD made aware and verbalizes will place orders for pain.

## 2017-04-23 NOTE — Telephone Encounter (Signed)
Requesting to speak with a nurse about something. Please call pt wife back.
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
## 2017-04-23 NOTE — Telephone Encounter (Signed)
Todd [name] [facility] PT) called to inform PT session for today has been cancelled per patient's wife. Rescheduled for next week, VO given.

## 2017-04-23 NOTE — ED Notes (Signed)
Respiratory heard wife say she gave pt Spriva and Albuterol "and will give it to him when I want to give it to him." Belfi MD made aware wife administered home medication.

## 2017-04-23 NOTE — ED Notes (Signed)
Bed: [facility] Expected date:  Expected time:  Means of arrival:  Comments: 71 yo fall
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
## 2017-04-23 NOTE — Telephone Encounter (Signed)
Thank you, agree he should go to the ER to be evaluated.

## 2017-04-23 NOTE — ED Triage Notes (Signed)
Per EMS pt from home. Complaint of right ankle and right knee pain post aid dropping him yesterday. Concern for swelling and pain with attempt to bear weight. Told to come here for xrays.

## 2017-04-23 NOTE — Care Management Note (Addendum)
Case Management Note  Patient Details  Name: Todd [name] MRN: [id number] Date of Birth: August 24, 1945  CM noted pt was active with [facility].  Spoke with Todd [name] who confirmed PT OT ST and NA services.  23  CM spoke with Dr. Ashok Cordia s/p pt and current status.  Dr. Ashok Cordia reports that pt gets PCS 15 hours per week.   1800 Wife confirms that pt gets an aid from [place] paid for through the [place] for 15 hours a week which is who dropped the pt.  Wife additionally reports that pt does not have any more SNF days that could be paid for through insurance.  CM advised her that she could increase her PCS through private pay or she could pay privately for a SNF which the [facility] team and [facility] could assist with.  Pt's wife became upset and stated she could not pay.  CM advised her that CM would check on the medical evaluation status and plan of admission vs home.  1815 CM and Dr. Tamera Punt spoke with pt's wife at beside.  EDP will attempt to have pt admitted for pain control but additionally noted that pt's Sp02 was 88% on RA while we were in the room, which is not normal for pt.  CM advised pt's wife that even if pt is admitted tonight, HHS will be increased to include an RN and an NA as once the admission is complete, she will still have to pay privately for increased PCS services or SNF based on what she told CM.  Pt's wife apologized for getting upset to this CM and understands the plan.  They additionally had plans to move to [facility] on 05/08/2017, which may be delayed due to ankle fracture and current mobility status.  Faxed new orders to [facility].  Weekend CM will be avalible for further needs.   Expected Discharge Date:    Unknown              Expected Discharge Plan:  [place] Choice:  Home Health Choice offered to:  Patient, Spouse  HH Arranged:  PT, OT, Nurse's Aide, Speech Therapy RN PCS HH Agency:  [place]  Status  of Service:  In process, will continue to follow  Rae Mar, RN 04/23/2017, 2:52 PM

## 2017-04-23 NOTE — ED Provider Notes (Signed)
Care taken over from Dr. Ashok Cordia.  Pt with bimalleolar fracture, 2 acute appearing rib fractures.  Unable to mobilize.  Has some hypoxia with room air sat of 88% even with suctioning, albuterol tx.  ? From hypoventilation.  No PTX seen, no pneumonia.  sats 94% on 2LPM.  Will consult [facility] service for admission.  I spoke with the resident who has accepted the pt for admission to [facility].   Malvin Johns, MD 04/23/17 [phone]

## 2017-04-23 NOTE — ED Notes (Signed)
Belfi MD reassesses pt, turns off oxygen, pt desaturates to 88% of room air. Belfi MD placed oxygen 2 lpm [place] back on pt, current saturation of 94%.

## 2017-04-23 NOTE — Telephone Encounter (Signed)
LM to call us back

## 2017-04-23 NOTE — ED Notes (Signed)
Wife requesting nasal suctioning by respiratory therapy. Wife verbalizes sometimes he cannot clear his throat and that is the only thing that works. Respiratory called.

## 2017-04-24 ENCOUNTER — Other Ambulatory Visit: Payer: Self-pay

## 2017-04-24 DIAGNOSIS — S8264XA Nondisplaced fracture of lateral malleolus of right fibula, initial encounter for closed fracture: Secondary | ICD-10-CM | POA: Diagnosis not present

## 2017-04-24 DIAGNOSIS — M8000XA Age-related osteoporosis with current pathological fracture, unspecified site, initial encounter for fracture: Secondary | ICD-10-CM

## 2017-04-24 DIAGNOSIS — S2241XA Multiple fractures of ribs, right side, initial encounter for closed fracture: Secondary | ICD-10-CM | POA: Diagnosis not present

## 2017-04-24 DIAGNOSIS — Z79899 Other long term (current) drug therapy: Secondary | ICD-10-CM

## 2017-04-24 DIAGNOSIS — Z9181 History of falling: Secondary | ICD-10-CM

## 2017-04-24 DIAGNOSIS — G40909 Epilepsy, unspecified, not intractable, without status epilepticus: Secondary | ICD-10-CM | POA: Diagnosis not present

## 2017-04-24 DIAGNOSIS — W19XXXA Unspecified fall, initial encounter: Secondary | ICD-10-CM | POA: Diagnosis not present

## 2017-04-24 DIAGNOSIS — S82891A Other fracture of right lower leg, initial encounter for closed fracture: Secondary | ICD-10-CM | POA: Diagnosis present

## 2017-04-24 DIAGNOSIS — Z87820 Personal history of traumatic brain injury: Secondary | ICD-10-CM

## 2017-04-24 DIAGNOSIS — F339 Major depressive disorder, recurrent, unspecified: Secondary | ICD-10-CM | POA: Diagnosis not present

## 2017-04-24 DIAGNOSIS — R1310 Dysphagia, unspecified: Secondary | ICD-10-CM

## 2017-04-24 DIAGNOSIS — Z8781 Personal history of (healed) traumatic fracture: Secondary | ICD-10-CM

## 2017-04-24 DIAGNOSIS — Z888 Allergy status to other drugs, medicaments and biological substances status: Secondary | ICD-10-CM

## 2017-04-24 DIAGNOSIS — R0902 Hypoxemia: Secondary | ICD-10-CM | POA: Diagnosis not present

## 2017-04-24 DIAGNOSIS — M4800 Spinal stenosis, site unspecified: Secondary | ICD-10-CM

## 2017-04-24 DIAGNOSIS — G8929 Other chronic pain: Secondary | ICD-10-CM

## 2017-04-24 DIAGNOSIS — Z8701 Personal history of pneumonia (recurrent): Secondary | ICD-10-CM

## 2017-04-24 DIAGNOSIS — Z87310 Personal history of (healed) osteoporosis fracture: Secondary | ICD-10-CM

## 2017-04-24 DIAGNOSIS — T17928A Food in respiratory tract, part unspecified causing other injury, initial encounter: Secondary | ICD-10-CM | POA: Diagnosis not present

## 2017-04-24 DIAGNOSIS — Z87891 Personal history of nicotine dependence: Secondary | ICD-10-CM

## 2017-04-24 DIAGNOSIS — Z886 Allergy status to analgesic agent status: Secondary | ICD-10-CM

## 2017-04-24 DIAGNOSIS — Z8619 Personal history of other infectious and parasitic diseases: Secondary | ICD-10-CM

## 2017-04-24 DIAGNOSIS — J449 Chronic obstructive pulmonary disease, unspecified: Secondary | ICD-10-CM

## 2017-04-24 DIAGNOSIS — Z885 Allergy status to narcotic agent status: Secondary | ICD-10-CM

## 2017-04-24 MED ORDER — B COMPLEX-C PO TABS
1.0000 | ORAL_TABLET | Freq: Every day | ORAL | Status: DC
  Administered 2017-04-24 – 2017-04-30 (×7): 1 via ORAL
  Filled 2017-04-24 (×7): qty 1

## 2017-04-24 MED ORDER — FENTANYL CITRATE (PF) 100 MCG/2ML IJ SOLN
50.0000 ug | Freq: Once | INTRAMUSCULAR | Status: AC
  Administered 2017-04-24: 50 ug via INTRAVENOUS
  Filled 2017-04-24: qty 2

## 2017-04-24 MED ORDER — ACETAMINOPHEN 500 MG PO TABS: 500.0000 mg | ORAL_TABLET | Freq: Four times a day (QID) | ORAL | Status: DC | PRN

## 2017-04-24 MED ORDER — FAMOTIDINE 20 MG PO TABS
20.0000 mg | ORAL_TABLET | Freq: Every day | ORAL | Status: DC
  Administered 2017-04-24 – 2017-04-30 (×7): 20 mg via ORAL
  Filled 2017-04-24 (×7): qty 1

## 2017-04-24 MED ORDER — ASPIRIN 81 MG PO CHEW
81.0000 mg | CHEWABLE_TABLET | Freq: Every day | ORAL | Status: DC
  Administered 2017-04-24 – 2017-04-30 (×7): 81 mg via ORAL
  Filled 2017-04-24 (×7): qty 1

## 2017-04-24 MED ORDER — CO-ENZYME Q-10 100 MG PO CAPS: 100.0000 mg | ORAL_CAPSULE | Freq: Every day | ORAL | Status: DC

## 2017-04-24 MED ORDER — ADULT MULTIVITAMIN W/MINERALS CH
1.0000 | ORAL_TABLET | Freq: Every day | ORAL | Status: DC
  Administered 2017-04-24 – 2017-04-30 (×7): 1 via ORAL
  Filled 2017-04-24 (×7): qty 1

## 2017-04-24 MED ORDER — LAMOTRIGINE 100 MG PO TABS
150.0000 mg | ORAL_TABLET | Freq: Two times a day (BID) | ORAL | Status: DC
  Administered 2017-04-24 – 2017-04-30 (×13): 150 mg via ORAL
  Filled 2017-04-24 (×13): qty 2

## 2017-04-24 MED ORDER — LAMOTRIGINE 100 MG PO TABS: 50.0000 mg | ORAL_TABLET | Freq: Two times a day (BID) | ORAL | Status: DC

## 2017-04-24 MED ORDER — ALBUTEROL SULFATE (2.5 MG/3ML) 0.083% IN NEBU
2.5000 mg | INHALATION_SOLUTION | Freq: Four times a day (QID) | RESPIRATORY_TRACT | Status: DC | PRN
  Administered 2017-04-24 – 2017-04-30 (×5): 2.5 mg via RESPIRATORY_TRACT
  Filled 2017-04-24 (×5): qty 3

## 2017-04-24 MED ORDER — ATORVASTATIN CALCIUM 80 MG PO TABS
80.0000 mg | ORAL_TABLET | Freq: Every day | ORAL | Status: DC
  Administered 2017-04-24 – 2017-04-29 (×6): 80 mg via ORAL
  Filled 2017-04-24 (×6): qty 1

## 2017-04-24 MED ORDER — DULOXETINE HCL 30 MG PO CPEP
30.0000 mg | ORAL_CAPSULE | Freq: Every day | ORAL | Status: DC
  Administered 2017-04-24 – 2017-04-30 (×7): 30 mg via ORAL
  Filled 2017-04-24 (×7): qty 1

## 2017-04-24 MED ORDER — POLYETHYLENE GLYCOL 3350 17 G PO PACK
17.0000 g | PACK | Freq: Every day | ORAL | Status: DC | PRN
  Filled 2017-04-24: qty 1

## 2017-04-24 MED ORDER — VITAMIN D 1000 UNITS PO TABS
2000.0000 [IU] | ORAL_TABLET | Freq: Every day | ORAL | Status: DC
  Administered 2017-04-24 – 2017-04-30 (×7): 2000 [IU] via ORAL
  Filled 2017-04-24 (×7): qty 2

## 2017-04-24 MED ORDER — POLYVINYL ALCOHOL 1.4 % OP SOLN
1.0000 [drp] | Freq: Every day | OPHTHALMIC | Status: DC
  Administered 2017-04-24: 1 [drp] via OPHTHALMIC
  Administered 2017-04-25 – 2017-04-30 (×6): 2 [drp] via OPHTHALMIC
  Filled 2017-04-24: qty 15

## 2017-04-24 MED ORDER — TIOTROPIUM BROMIDE MONOHYDRATE 18 MCG IN CAPS
18.0000 ug | ORAL_CAPSULE | Freq: Every day | RESPIRATORY_TRACT | Status: DC
  Administered 2017-04-24 – 2017-04-30 (×3): 18 ug via RESPIRATORY_TRACT
  Filled 2017-04-24: qty 5

## 2017-04-24 MED ORDER — CLOPIDOGREL BISULFATE 75 MG PO TABS
75.0000 mg | ORAL_TABLET | Freq: Every morning | ORAL | Status: DC
  Administered 2017-04-24 – 2017-04-30 (×7): 75 mg via ORAL
  Filled 2017-04-24 (×7): qty 1

## 2017-04-24 MED ORDER — FLUTICASONE PROPIONATE 50 MCG/ACT NA SUSP
2.0000 | Freq: Every day | NASAL | Status: DC
  Administered 2017-04-24 – 2017-04-30 (×7): 2 via NASAL
  Filled 2017-04-24: qty 16

## 2017-04-24 MED ORDER — ENOXAPARIN SODIUM 40 MG/0.4ML ~~LOC~~ SOLN
40.0000 mg | SUBCUTANEOUS | Status: DC
  Administered 2017-04-24 – 2017-04-30 (×7): 40 mg via SUBCUTANEOUS
  Filled 2017-04-24 (×7): qty 0.4

## 2017-04-24 MED ORDER — LORATADINE 10 MG PO TABS
10.0000 mg | ORAL_TABLET | Freq: Every evening | ORAL | Status: DC
  Administered 2017-04-24 – 2017-04-29 (×6): 10 mg via ORAL
  Filled 2017-04-24 (×6): qty 1

## 2017-04-24 MED ORDER — OXYCODONE HCL 5 MG PO TABS
5.0000 mg | ORAL_TABLET | ORAL | Status: DC | PRN
  Administered 2017-04-24 – 2017-04-30 (×9): 5 mg via ORAL
  Filled 2017-04-24 (×9): qty 1

## 2017-04-24 MED ORDER — VITAMIN C 500 MG PO TABS
1000.0000 mg | ORAL_TABLET | Freq: Every day | ORAL | Status: DC
  Administered 2017-04-24 – 2017-04-30 (×7): 1000 mg via ORAL
  Filled 2017-04-24 (×7): qty 2

## 2017-04-24 MED ORDER — LAMOTRIGINE 100 MG PO TABS: 100.0000 mg | ORAL_TABLET | Freq: Two times a day (BID) | ORAL | Status: DC

## 2017-04-24 MED ORDER — ISOSORBIDE MONONITRATE ER 30 MG PO TB24
30.0000 mg | ORAL_TABLET | Freq: Every day | ORAL | Status: DC
  Administered 2017-04-24 – 2017-04-30 (×7): 30 mg via ORAL
  Filled 2017-04-24 (×7): qty 1

## 2017-04-24 MED ORDER — NAPROXEN 250 MG PO TABS: 500.0000 mg | ORAL_TABLET | Freq: Two times a day (BID) | ORAL | Status: DC | PRN

## 2017-04-24 MED ORDER — DM-GUAIFENESIN ER 30-600 MG PO TB12
1.0000 | ORAL_TABLET | Freq: Two times a day (BID) | ORAL | Status: DC
  Administered 2017-04-24 – 2017-04-30 (×13): 1 via ORAL
  Filled 2017-04-24 (×14): qty 1

## 2017-04-24 MED ORDER — BIOTIN 1000 MCG PO TABS: 1000.0000 ug | ORAL_TABLET | Freq: Every day | ORAL | Status: DC

## 2017-04-24 NOTE — Progress Notes (Addendum)
IMTS was paged for admission @ 18:30. I have called [facility] ED repeatedly regarding transfer status - informed he has a bed but waiting on [facility] transport. [facility] ED is aware of our 2 hour transfer policy due to concern for patient safety. I spoke with Dr. Tamera Punt @ 23:45 who informed me [facility] should be there within the hour to transport patient. If transfer is delayed much longer, will call back and refuse admission.   Velna Ochs, M.D. - PGY2 Pager: [phone] 04/24/2017, 12:45 AM

## 2017-04-24 NOTE — Evaluation (Addendum)
Occupational Therapy Evaluation Patient Details Name: Todd [name] MRN: [id number] DOB: 1945-12-14 Today's Date: 04/24/2017    History of Present Illness 71 year old male who presented to the ED due to chest pain anda fallresulting in R side rib fractures and R ankle fracture with non-operative treatment. PMHx significant for COPD, TBI with severe sequelae, bowel and bladder incontinence, vertebral fracture and Hep C.    Clinical Impression   Pt required assist with ADL and mobility PTA. Currently pt requires max-total assist +2 for bed mobility and total assist overall for ADL. Pt with difficulty maintaining sitting balance at EOB; required max assist for upright posture with strong posterior lean. Recommending SNF for follow up to maximize independence and safety with ADL and functional mobility prior to return home. Pt would benefit from continued skilled OT to address established goals.    Follow Up Recommendations  SNF;Supervision/Assistance - 24 hour    Equipment Recommendations  None recommended by OT    Recommendations for Other Services       Precautions / Restrictions Precautions Precautions: Fall Restrictions Other Position/Activity Restrictions: No weight bearing status in orders but maintained RLE NWB throughout session.      Mobility Bed Mobility Overal bed mobility: Needs Assistance Bed Mobility: Rolling;Supine to Sit;Sit to Supine Rolling: Max assist   Supine to sit: Max assist;+2 for physical assistance Sit to supine: Total assist;+2 for physical assistance   General bed mobility comments: Assist for LEs and trunk with bed mobility. Pt intermittently following commands to participate.  Transfers Overall transfer level: Needs assistance               General transfer comment: Utilized maximove for pt OOB to chair.    Balance Overall balance assessment: Needs assistance Sitting-balance support: Feet supported;Bilateral upper extremity  supported Sitting balance-Leahy Scale: Poor Sitting balance - Comments: Max assist to maintain sitting balance. Strong posterior lean despite max multimodal cues for anterior lean. Postural control: Posterior lean                                 ADL either performed or assessed with clinical judgement   ADL Overall ADL's : Needs assistance/impaired                                       General ADL Comments: Pt currently overall max-total assist for all ADL.     Vision         Perception     Praxis      Pertinent Vitals/Pain Pain Assessment: Faces Faces Pain Scale: Hurts even more Pain Location: back, RLE Pain Descriptors / Indicators: Grimacing Pain Intervention(s): Monitored during session;Repositioned;Patient requesting pain meds-RN notified     Hand Dominance Right   Extremity/Trunk Assessment Upper Extremity Assessment Upper Extremity Assessment: Generalized weakness   Lower Extremity Assessment Lower Extremity Assessment: Defer to PT evaluation   Cervical / Trunk Assessment Cervical / Trunk Assessment: Kyphotic   Communication Communication Communication: Expressive difficulties   Cognition Arousal/Alertness: Awake/alert Behavior During Therapy: Flat affect Overall Cognitive Status: Impaired/Different from baseline Area of Impairment: Following commands;Safety/judgement                       Following Commands: Follows one step commands inconsistently Safety/Judgement: Decreased awareness of safety;Decreased awareness of deficits     General Comments: Wife  reports increased confusion and decreased pt interacting now.   General Comments  Discussed post acute rehab with wife, she is agreeable.    Exercises     Shoulder Instructions      Home Living Family/patient expects to be discharged to:: Skilled nursing facility                                        Prior Functioning/Environment  Level of Independence: Needs assistance  Gait / Transfers Assistance Needed: Ambulating short distances with RW and physical assist. About 20 feet x2 with HHPT most recently. ADL's / Homemaking Assistance Needed: Aide assists with bathing and dressing. Pt able to self feed.            OT Problem List: Decreased strength;Decreased range of motion;Decreased activity tolerance;Impaired balance (sitting and/or standing);Decreased coordination;Decreased cognition;Decreased safety awareness;Decreased knowledge of use of DME or AE;Decreased knowledge of precautions;Impaired UE functional use;Pain;Increased edema      OT Treatment/Interventions: Self-care/ADL training;Therapeutic exercise;Energy conservation;DME and/or AE instruction;Therapeutic activities;Patient/family education;Balance training;Cognitive remediation/compensation    OT Goals(Current goals can be found in the care plan section) Acute Rehab OT Goals Patient Stated Goal: wife would like rehab then home OT Goal Formulation: With family Time For Goal Achievement: 05/08/17 Potential to Achieve Goals: Good ADL Goals Pt Will Perform Eating: with min assist;sitting Pt Will Transfer to Toilet: with max assist;stand pivot transfer;bedside commode Additional ADL Goal #1: Pt will tolerate sitting EOB x10 mintues with min assist. Additional ADL Goal #2: Pt will perform bed mobility with min assist as precursor to ADL.  OT Frequency: Min 2X/week   Barriers to D/C: Decreased caregiver support          Co-evaluation PT/OT/SLP Co-Evaluation/Treatment: Yes Reason for Co-Treatment: Necessary to address cognition/behavior during functional activity;Complexity of the patient's impairments (multi-system involvement);For patient/therapist safety   OT goals addressed during session: Other (comment)(functional mobility)      AM-PAC PT "6 Clicks" Daily Activity     Outcome Measure Help from another person eating meals?: Total Help from  another person taking care of personal grooming?: Total Help from another person toileting, which includes using toliet, bedpan, or urinal?: Total Help from another person bathing (including washing, rinsing, drying)?: Total Help from another person to put on and taking off regular upper body clothing?: Total Help from another person to put on and taking off regular lower body clothing?: Total 6 Click Score: 6   End of Session Nurse Communication: Mobility status;Need for lift equipment;Weight bearing status  Activity Tolerance: Patient tolerated treatment well Patient left: in chair;with call bell/phone within reach;with chair alarm set;with family/visitor present;with nursing/sitter in room  OT Visit Diagnosis: Other abnormalities of gait and mobility (R26.89);History of falling (Z91.81);Muscle weakness (generalized) (M62.81);Cognitive communication deficit (R41.841)                Time: 5170-0174 OT Time Calculation (min): 29 min Charges:  OT General Charges $OT Visit: 1 Visit OT Evaluation $OT Eval Moderate Complexity: 1 Mod G-Codes: OT G-codes **NOT FOR INPATIENT CLASS** Functional Assessment Tool Used: AM-PAC 6 Clicks Daily Activity Functional Limitation: Self care Self Care Current Status (B4496): At least 80 percent but less than 100 percent impaired, limited or restricted Self Care Goal Status (P5916): At least 40 percent but less than 60 percent impaired, limited or restricted   Mel Almond A. Ulice Brilliant, M.S., OTR/L Pager: [phone] 04/24/2017, 1:45  PM
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
## 2017-04-24 NOTE — Progress Notes (Signed)
Patient transferred from [facility] ED via care link. Internal  Medicine  MD at bedside. Patient is calm and denies pain at this time.

## 2017-04-24 NOTE — Clinical Social Work Note (Signed)
CSW spoke with pt's wife at bedside. Pt exhausted SNF benefit and pt's wife wants to utilize [place] benefit. Pt is active with [facility]. Pt's wife provide CSW will numbers to reach worker at [place]. CSW will start the process and follow up with pt and pts spouse.   [place], [place]
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
## 2017-04-24 NOTE — Progress Notes (Signed)
   Subjective: Difficult to communicate with patient this morning, denies being in a lot of pain acknowledges his pain was pretty well controlled.    Objective:  Vital signs in last 24 hours: Vitals:   04/24/17 0000 04/24/17 0113 04/24/17 0434 04/24/17 1245  BP: 110/74 116/83 115/80   Pulse: 95 93 85   Resp:      Temp:  98.5 F (36.9 C) 97.7 F (36.5 C)   TempSrc:  Axillary Oral   SpO2: 94% 94% 94% 98%   Physical Exam  Constitutional: He appears well-developed and well-nourished.  HENT:  Head: Normocephalic and atraumatic.  Eyes: Right eye exhibits no discharge. Left eye exhibits no discharge. No scleral icterus.  Cardiovascular: Normal rate, regular rhythm, normal heart sounds and intact distal pulses. Exam reveals no gallop and no friction rub.  No murmur heard. Pulmonary/Chest: Effort normal. No accessory muscle usage. No respiratory distress. He has no wheezes. He has no rales.  Rattling sound in chest suggests aspiration and poor ability to clear secretions  Abdominal: Soft. Bowel sounds are normal. He exhibits no distension and no mass. There is no tenderness. There is no guarding.  Musculoskeletal:  Splint on R leg down to toes.  Toenails with good capillary refill.    Neurological: He is alert.   Assessment/Plan:  Active Problems:   Hypoxia   Rib fracture   Ankle fracture, right, closed, initial encounter  R ankle fracture:  Pt with new physical therapist who dropped pt during therapy session, pt with known osteoporosis and R sided weakness from TBI.  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  Also seems based on physical and history that patient has aspirated a large amount of fluid.  Has COPD at baseline as well  -diet is dysphagia 3 honey thick for now, will see how he does -mucinex, respiratory therapy performed deep suctioning this afternoon will continue to follow -will continue home COPD inhalers  degenerative disc disease/spinal stenosis: chronic, a significant source of pain for the patient  -will continue pain managment   Dispo: Wife is working with Education officer, museum for SNF placement with Anticipated discharge in approximately 2-3 day(s).   Katherine Roan, MD 04/24/2017, 1:46 PM Vickki Muff MD PGY-1 Internal Medicine Pager # [phone]

## 2017-04-25 LAB — BASIC METABOLIC PANEL
Anion gap: 8 (ref 5–15)
BUN: 16 mg/dL (ref 6–20)
CHLORIDE: 104 mmol/L (ref 101–111)
CO2: 28 mmol/L (ref 22–32)
CREATININE: 0.8 mg/dL (ref 0.61–1.24)
Calcium: 9.1 mg/dL (ref 8.9–10.3)
GFR calc non Af Amer: >60 mL/min (ref >60)
GLUCOSE: 103 mg/dL — AB (ref 65–99)
Potassium: 3.8 mmol/L (ref 3.5–5.1)
Sodium: 140 mmol/L (ref 135–145)

## 2017-04-25 LAB — CBC
HCT: 40.6 % (ref 39.0–52.0)
Hemoglobin: 13 g/dL (ref 13.0–17.0)
MCH: 32.7 pg (ref 26.0–34.0)
MCHC: 32 g/dL (ref 30.0–36.0)
MCV: 102 fL — ABNORMAL HIGH (ref 78.0–100.0)
Platelets: 200 10*3/uL (ref 150–400)
RBC: 3.98 MIL/uL — ABNORMAL LOW (ref 4.22–5.81)
RDW: 13.7 % (ref 11.5–15.5)
WBC: 7.4 10*3/uL (ref 4.0–10.5)

## 2017-04-25 NOTE — Progress Notes (Signed)
Medicine attending: Clinical status and database reviewed with resident physician Dr. Katherine Roan and I concur with his evaluation and management plan which we discussed together. More alert today.  Lung exam improved.  Swelling study shows stable chronic dysphagia compared with previous evaluations.  Dysphagia 2 diet recommended. Dr. Shan Levans was able to have a discussion with the patient's male companion and long-term caregiver.  She does need assistance on the short-term and we will still pursue rehab facility placement for his broken ankle and ribs.  She is opposed to consideration for placement of a PEG feeding tube.  We will certainly respect her wishes in this regard. No further episodes of hypoxia other than isolated findings in the emergency department on day of admission. We will involve our care management team at this point to locate appropriate skilled nursing facility.

## 2017-04-25 NOTE — Progress Notes (Signed)
   Subjective: Patient communicating better today.  Has some mild back pain this morning.  Feels he was able to eat okay without choking yesterday evening.    Objective:  Vital signs in last 24 hours: Vitals:   04/24/17 1245 04/24/17 1300 04/24/17 2002 04/25/17 0425  BP:  110/74 123/71   Pulse:  87 77 77  Resp:  18 18 18   Temp:  97.8 F (36.6 C) (!) 97.5 F (36.4 C) 97.8 F (36.6 C)  TempSrc:  Oral Oral Oral  SpO2: 98% 94% 95% 96%   Physical Exam  Constitutional: He appears well-developed and well-nourished.  HENT:  Head: Normocephalic and atraumatic.  Eyes: Right eye exhibits no discharge. Left eye exhibits no discharge. No scleral icterus.  Cardiovascular: Normal rate, regular rhythm, normal heart sounds and intact distal pulses. Exam reveals no gallop and no friction rub.  No murmur heard. Pulmonary/Chest: Effort normal. No accessory muscle usage. No respiratory distress. He has no wheezes. He has no rales.  no gurgling sounds in throat as well today, benefited from deep suctioning.  Abdominal: Soft. Bowel sounds are normal. He exhibits no distension and no mass. There is no tenderness. There is no guarding.  Musculoskeletal:  Splint on R leg down to toes.  Toenails with good capillary refill.    Neurological: He is alert.   Assessment/Plan:  Active Problems:   History of traumatic brain injury   Hypoxia   Seizure disorder (HCC)   Rib fracture   Ankle fracture, right, closed, initial encounter  R ankle fracture:  Pt with new physical therapist who dropped pt during therapy session, pt with known osteoporosis and R sided weakness from TBI.  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  Also seems based on physical and history that patient has aspirated a large amount of fluid.  Has COPD at baseline as well  -diet is dysphagia 2 honey thick  now, will see how he does -mucinex, respiratory therapy performed deep suctioning yesterday afternoon sounds much better today -will continue home COPD inhalers  degenerative disc disease/spinal stenosis: chronic, a significant source of pain for the patient  -will continue pain managment   Dispo: Wife is working with Education officer, museum for SNF placement with Anticipated discharge in approximately 1-2 day(s).   Katherine Roan, MD 04/25/2017, 11:10 AM Vickki Muff MD PGY-1 Internal Medicine Pager # [phone]

## 2017-04-26 DIAGNOSIS — J449 Chronic obstructive pulmonary disease, unspecified: Secondary | ICD-10-CM | POA: Diagnosis not present

## 2017-04-26 DIAGNOSIS — W19XXXA Unspecified fall, initial encounter: Secondary | ICD-10-CM | POA: Diagnosis not present

## 2017-04-26 DIAGNOSIS — G8929 Other chronic pain: Secondary | ICD-10-CM | POA: Diagnosis not present

## 2017-04-26 DIAGNOSIS — G40909 Epilepsy, unspecified, not intractable, without status epilepticus: Secondary | ICD-10-CM | POA: Diagnosis not present

## 2017-04-26 DIAGNOSIS — Z888 Allergy status to other drugs, medicaments and biological substances status: Secondary | ICD-10-CM | POA: Diagnosis not present

## 2017-04-26 DIAGNOSIS — X58XXXA Exposure to other specified factors, initial encounter: Secondary | ICD-10-CM | POA: Diagnosis not present

## 2017-04-26 DIAGNOSIS — M80071A Age-related osteoporosis with current pathological fracture, right ankle and foot, initial encounter for fracture: Secondary | ICD-10-CM

## 2017-04-26 DIAGNOSIS — R0902 Hypoxemia: Secondary | ICD-10-CM | POA: Diagnosis not present

## 2017-04-26 DIAGNOSIS — T17928A Food in respiratory tract, part unspecified causing other injury, initial encounter: Secondary | ICD-10-CM | POA: Diagnosis not present

## 2017-04-26 DIAGNOSIS — M4800 Spinal stenosis, site unspecified: Secondary | ICD-10-CM | POA: Diagnosis not present

## 2017-04-26 DIAGNOSIS — S82841A Displaced bimalleolar fracture of right lower leg, initial encounter for closed fracture: Secondary | ICD-10-CM | POA: Diagnosis present

## 2017-04-26 MED ORDER — ACETAMINOPHEN 500 MG PO TABS: 500.0000 mg | ORAL_TABLET | Freq: Four times a day (QID) | ORAL | 0 refills | Status: AC | PRN

## 2017-04-26 NOTE — Discharge Summary (Signed)
Name: Todd [name] MRN: [id number] DOB: [birth year] 71 y.o. PCP: Aldine Contes, MD  Date of Admission: 04/23/2017  1:42 PM Date of Discharge:  Attending Physician: [name], MD  Discharge Diagnosis: 1.  Active Problems:   History of traumatic brain injury   Hypoxia   Seizure disorder (HCC)   Rib fracture   Ankle fracture, right, closed, initial encounter   Discharge Medications: Allergies as of 04/26/2017      Reactions   Acetaminophen Other (See Comments)   Pt is unable to take this medication due to liver disease.     Nsaids Other (See Comments)   Pt is unable to take this medication due to liver disease.     Beta Adrenergic Blockers Other (See Comments)   Disorientation, dizziness   Codeine Hives      Medication List    STOP taking these medications   naproxen sodium 220 MG tablet Commonly known as:  ALEVE     TAKE these medications   acetaminophen 500 MG tablet Commonly known as:  TYLENOL Take 1 tablet (500 mg total) by mouth every 6 (six) hours as needed for mild pain or moderate pain.   albuterol 108 (90 Base) MCG/ACT inhaler Commonly known as:  PROAIR HFA inhale 2 puffs INTO THE LUNGS every 6 hours if needed for wheezing What changed:    how much to take  how to take this  when to take this  reasons to take this  additional instructions   alendronate 70 MG tablet Commonly known as:  FOSAMAX Take 1 tablet (70 mg total) by mouth once a week. Take with a full glass of water on an empty stomach. What changed:    when to take this  additional instructions   aspirin 81 MG chewable tablet Chew 81 mg by mouth daily.   atorvastatin 80 MG tablet Commonly known as:  LIPITOR Take 1 tablet (80 mg total) by mouth daily at 6 PM.   b complex vitamins tablet Take 1 tablet by mouth daily.   Biotin 1000 MCG tablet Take 1,000 mcg by mouth daily.   chlorpheniramine-HYDROcodone 10-8 MG/5ML Suer Commonly known as:  TUSSIONEX  PENNKINETIC ER Take 5 mLs by mouth every 12 (twelve) hours as needed for cough.   clopidogrel 75 MG tablet Commonly known as:  PLAVIX take 1 tablet by mouth every morning   Co-Enzyme Q-10 100 MG Caps Take 100 mg by mouth daily.   desonide 0.05 % cream Commonly known as:  DESOWEN Apply 1 application topically 2 (two) times daily. Pt applies to hair line on top of head and around mustache   DULoxetine 30 MG capsule Commonly known as:  CYMBALTA Take 1 capsule (30 mg total) by mouth daily.   famotidine 20 MG tablet Commonly known as:  PEPCID Take 20 mg by mouth daily.   fluticasone 50 MCG/ACT nasal spray Commonly known as:  FLONASE instill 1 spray into each nostril once daily   guaifenesin 100 MG/5ML syrup Commonly known as:  ROBITUSSIN Take 30 mLs by mouth 3 (three) times daily as needed for cough. What changed:  Another medication with the same name was removed. Continue taking this medication, and follow the directions you see here.   hydroxypropyl methylcellulose / hypromellose 2.5 % ophthalmic solution Commonly known as:  ISOPTO TEARS / GONIOVISC Place 1-2 drops into both eyes daily.   isosorbide mononitrate 30 MG 24 hr tablet Commonly known as:  IMDUR Take 1 tablet (30 mg total) by  mouth daily.   ketotifen 0.025 % ophthalmic solution Commonly known as:  [employer] ANTIHISTAMINE EYE DROPS Place 1 drop into both eyes 2 (two) times daily. What changed:    when to take this  reasons to take this   lamoTRIgine 100 MG tablet Commonly known as:  LAMICTAL Take 1 tablet by mouth twice daily. What changed:    how much to take  how to take this  when to take this  additional instructions   lamoTRIgine 25 MG tablet Commonly known as:  LAMICTAL Take 2 tablets, with 100mg  tablet, twice a day. What changed:    how much to take  how to take this  when to take this  additional instructions   levocetirizine 5 MG tablet Commonly known as:  XYZAL Take 5 mg by mouth  every evening.   lidocaine 5 % Commonly known as:  LIDODERM Place 1 patch onto the skin daily. Pt applies to back.  Remove & Discard patch within 12 hours or as directed by MD   multivitamin with minerals Tabs tablet Take 1 tablet by mouth daily.   oxyCODONE 5 MG immediate release tablet Commonly known as:  Oxy IR/ROXICODONE Take 0.5 tablets (2.5 mg total) by mouth daily as needed for severe pain.   SPIRIVA RESPIMAT 2.5 MCG/ACT Aers Generic drug:  Tiotropium Bromide Monohydrate Inhale 2 puffs into the lungs daily.   vitamin C 1000 MG tablet Take 1,000 mg by mouth daily.   Vitamin D 2000 units tablet Take 2,000 Units by mouth daily.       Disposition and follow-up:   Mr.Harun E Biggar was discharged from [facility] in Good condition.  At the hospital follow up visit please address:  1.    Hypoxia/Dysphagia -continue to monitor pt for signs of hypoxia, avoid excessive opioids while rib fractures healing -important to stick to dysphagia 2 diet with honey thick   R fibular and medial malleolar fractures/rib fractures -continue with splint, placed referral for pt to follow up with orthopedic surgery  -PT/OT   2.  Labs / imaging needed at time of follow-up: none  3.  Pending labs/ test needing follow-up: none  Follow-up Appointments: Follow-up Information    [facility].   Specialty:  Home Health Services Contact information: [address] [place] [address] [phone]        orthopedic surgery Follow up in 4 week(s).   Why:  Placed a referral for pt to follow up with orthopedic surgery       Aldine Contes, MD Follow up in 1 month(s).   Specialty:  Internal Medicine Why:  can follow up with PCP in one month or earlier if needed Contact information: [place], [place] [address] [phone]           Hospital Course by problem list: Active Problems:   History  of traumatic brain injury   Hypoxia   Seizure disorder [facility])   Rib fracture   Ankle fracture, right, closed, initial encounter   Patient arrived at [facility] after a fall that occurred at home while working with a new physical therapist.  He was found to have a closed fracture of his right distal fibula and medial malleolus. additionally, that same day he choked on a Brussels sprout and was given the Heimlich maneuver by his significant other resulting in fracture of his right ninth and 10th ribs.  During his evaluation he was in pain  and also noted to have some hypoxia.  This was thought to be in part from his new broken ribs and some sequelae of the choking event as well.  His right ankle and lower leg were placed in a splint in the emergency department by orthopedics.  He was given more adequate pain control and respiratory therapy came by and perform deep suctioning, these 2 things together return him back to his baseline and he no longer needed any oxygen supplementation.  Speech-language pathology came by to reevaluate his chronic dysphasia, By their estimation it is stable and they recommended dysphagia 2 diet with honey thick liquids.  He did very well with this diet no more choking episodes occurred.  Additionally remained off of oxygen for the remainder of his stay.  Due to his residual weakness from his traumatic brain injury and now his new fractured lower leg and ankle limiting his mobility further it was decided it would be best for the patient to have a short stay in rehab until he is more mobile and the leg has healed.  He was discharged in good stable condition to a skilled nursing facility  Discharge Vitals:   BP 118/82 (BP Location: Right Arm)   Pulse 65   Temp 97.8 F (36.6 C) (Oral)   Resp 18   SpO2 98%   Pertinent Labs, Studies, and Procedures:   CBC Latest Ref Rng & Units 04/25/2017 04/23/2017 12/09/2016  WBC 4.0 - 10.5 K/uL 7.4 9.6 7.5  Hemoglobin 13.0 - 17.0 g/dL 13.0  13.2 13.2  Hematocrit 39.0 - 52.0 % 40.6 40.8 40.7  Platelets 150 - 400 K/uL 200 193 200   BMP Latest Ref Rng & Units 04/25/2017 04/23/2017 01/28/2017  Glucose 65 - 99 mg/dL 103(H) 99 106(H)  BUN 6 - 20 mg/dL 16 20 14   Creatinine 0.61 - 1.24 mg/dL 0.80 0.84 0.96  BUN/Creat Ratio 10 - 24 - - 15  Sodium 135 - 145 mmol/L 140 141 142  Potassium 3.5 - 5.1 mmol/L 3.8 4.0 4.4  Chloride 101 - 111 mmol/L 104 112(H) 107(H)  CO2 22 - 32 mmol/L 28 24 24   Calcium 8.9 - 10.3 mg/dL 9.1 8.7(L) 9.0   EXAM: RIGHT RIBS AND CHEST - 3+ VIEW   COMPARISON:  12/09/2016   FINDINGS: Single-view chest demonstrates mild right pleural thickening. Low lung volumes. No pneumothorax. Borderline cardiomegaly.   Right rib series demonstrates old right fourth, fifth, sixth, seventh, eighth, and ninth posterior rib fractures. Additional old appearing right third, fourth, fifth, sixth, seventh, eighth anterolateral rib fractures. Possible acute right ninth and tenth lateral rib fractures.   IMPRESSION: 1. Mild right pleural thickening.  Negative for pneumothorax 2. There are multiple old right-sided rib fractures. Possible acute right ninth and tenth lateral rib fractures.    CLINICAL DATA:  [place].  Right ankle pain.   EXAM: RIGHT ANKLE - COMPLETE 3+ VIEW   COMPARISON:  None.   FINDINGS: Evidence of remote trauma involving the distal tibia with healed fractures. Remote healed proximal fibular fractures are also noted. Suspect a nondisplaced acute distal fibular fracture. There is also a distal avulsion-type fracture involving the medial malleolus.   IMPRESSION: Nondisplaced fractures involving the medial and lateral malleoli.   SLP evaluation:   Assessment / Plan / Recommendation Clinical Impression   Patient presents with swallowing function which appears consistent with prior evaluations and long-standing chronic dysphagia. His wife does report that pt choked on a brussel sprout recently at home,  shortly after his  fall. She noted he was breathing rapidly and inhaled while chewing. Wife demonstrates extensive knowledge of pt's dysphagia and strategies for management. He has been working with SLP in home health. Provided diagnostic observation of swallow function as pt's wife assisted and cued pt with intake. No issues with coordinating swallow/respiration seen today. Pt requires cue to use chin tuck. Airway protection appears adequate for soft solids and honey thick liquids. Pt's wife requests finely chopped meats with extra gravy. Will downgrade to dys 2, honey thick liquids per her request. No further skilled ST needs identified; will s/o.  SLP Visit Diagnosis: Dysphagia, oropharyngeal phase (R13.12)    Aspiration Risk   Moderate aspiration risk     Diet Recommendation Dysphagia 2 (Fine chop);Honey-thick liquid    Liquid Administration via: Straw Medication Administration: Crushed with puree Supervision: Patient able to self feed;Full supervision/cueing for compensatory strategies Compensations: Minimize environmental distractions;Slow rate;Small sips/bites;Chin tuck Postural Changes: Seated upright at 90 degrees      Discharge Instructions: Discharge Instructions    Ambulatory referral to Orthopedic Surgery   Complete by:  As directed    Diet - low sodium heart healthy   Complete by:  As directed    Discharge instructions   Complete by:  As directed    Mr. Klabunde will need continued PT/OT therapy.  Additionally, he will need to follow up with orthopedic surgery to manage his ankle fractures.  It will be important to follow the dysphagia 2 diet with honey thick due to chronic dysphagia with recent choking episode.   Increase activity slowly   Complete by:  As directed       Signed: Katherine Roan, MD 04/26/2017, 12:17 PM

## 2017-04-26 NOTE — Progress Notes (Addendum)
   Subjective: Patient in a very good mood today, alert.  Not complaining of pain, SOB.  Feels he has been able to eat well without choking.   Objective:  Vital signs in last 24 hours: Vitals:   04/25/17 0425 04/25/17 1406 04/25/17 1956 04/26/17 0330  BP:  108/71 111/80 118/82  Pulse: 77 88 85 65  Resp: 18 18 18 18   Temp: 97.8 F (36.6 C) 98.2 F (36.8 C) 98.2 F (36.8 C) 97.8 F (36.6 C)  TempSrc: Oral Oral Oral Oral  SpO2: 96% 97% 96% 98%   Physical Exam  Constitutional: He appears well-developed and well-nourished.  HENT:  Head: Normocephalic and atraumatic.  Eyes: Right eye exhibits no discharge. Left eye exhibits no discharge. No scleral icterus.  Cardiovascular: Normal rate, regular rhythm, normal heart sounds and intact distal pulses. Exam reveals no gallop and no friction rub.  No murmur heard. Pulmonary/Chest: Effort normal. No accessory muscle usage. No respiratory distress. He has no wheezes. He has no rales.  Abdominal: Soft. Bowel sounds are normal. He exhibits no distension and no mass. There is no tenderness. There is no guarding.  Musculoskeletal:  Splint on R leg down to toes.  Toenails with good capillary refill.    Neurological: He is alert.   Assessment/Plan:  Active Problems:   History of traumatic brain injury   Hypoxia   Seizure disorder (HCC)   Rib fracture   Ankle fracture, right, closed, initial encounter  R ankle fracture:  Pt with new physical therapist who dropped pt during therapy session, pt with known osteoporosis and R sided weakness from TBI.  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  Also seems based on physical and history that patient has aspirated a large amount of fluid.  Has COPD at baseline as well  -resolved, remains on room air with normal O2 saturation -diet is dysphagia 2 honey thick  now, will see how he does -mucinex, respiratory therapy performed deep suctioning yesterday afternoon sounds much better today -will continue home COPD inhalers  degenerative disc disease/spinal stenosis: chronic, a significant source of pain for the patient  -will continue pain managment   Dispo: Wife is working with Education officer, museum for SNF placement with Anticipated discharge later today  Katherine Roan, MD 04/26/2017, 11:37 AM Vickki Muff MD PGY-1 Internal Medicine Pager # [phone]

## 2017-04-26 NOTE — Clinical Social Work Note (Signed)
Clinical Social Work Assessment  Patient Details  Name: Todd [name] MRN: [id number] Date of Birth: 1945-12-11  Date of referral:  04/26/17               Reason for consult:  Facility Placement                Permission sought to share information with:  Facility Art therapist granted to share information::  Yes, Release of Information Signed(HCPOA)  Name::     Todd [name]  Agency::  SNF; [facility]  Relationship::  wife  Contact Information:     Housing/Transportation Living arrangements for the past 2 months:  Single Family Home Source of Information:  Spouse Patient Interpreter Needed:  None Criminal Activity/Legal Involvement Pertinent to Current Situation/Hospitalization:  No - Comment as needed Significant Relationships:  Spouse Lives with:  Spouse Do you feel safe going back to the place where you live?  No Need for family participation in patient care:  Yes (Comment)  Care giving concerns:  Pt lives at home with spouse- had fall at home and now considerably less mobile following fall and resulting fractures.   Social Worker assessment / plan:  CSW spoke with pt wife at bedside concerning plan for time of DC.  Pt wife familiar with SNF process following other admissions.  Pt wife reports she would like pt placed through his [facility] benefits- during chart review CSW saw that this was attempted last admission but pt wife was only agreeable to [place] and [facility] which had been full.  CSW discussed possible medicare placement- states that he used up days at [facility] in October of this year and that his insurance policy is going to change on January 1 to [employer]- states that she does not like any area SNFs contracted with this provider.  CSW explained barriers to [place] placement at this time due to being closed for the holiday and that they would have required full referral be sent in by 11am this morning.  Employment status:  Disabled  (Comment on whether or not currently receiving Disability) Insurance information:  Programmer, applications, [place] Benefit PT Recommendations:  [place] / Referral to community resources:  [place]  Patient/Family's Response to care:  Wife agreeable to SNF but very particular about what she will agree too.  Patient/Family's Understanding of and Emotional Response to Diagnosis, Current Treatment, and Prognosis:  Pt spouse is very involved in care and knowledgeable about his care needs- hopeful pt will improve and they can move forward with plans to move to [facility]  Emotional Assessment Appearance:  Appears stated age Attitude/Demeanor/Rapport:  Unable to Assess Affect (typically observed):  Unable to Assess Orientation:  Oriented to Self Alcohol / Substance use:  Not Applicable Psych involvement (Current and /or in the community):  No (Comment)  Discharge Needs  Concerns to be addressed:  Care Coordination Readmission within the last 30 days:  No Current discharge risk:  Physical Impairment Barriers to Discharge:  Continued Medical Work up   Jorge Ny, LCSW 04/26/2017, 12:50 PM

## 2017-04-26 NOTE — Progress Notes (Signed)
Medicine attending: I examined this patient today together with resident physician Dr. Katherine Roan and I concur with his evaluation and management plan which we discussed together. The patient is back to his baseline.  With an acute on chronic right ankle fracture and acute right rib fractures his primary caregiver and lives in a male companion does not have the home resources to care for him at this time and we will continue to pursue a temporary skilled nursing facility bed. 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  He is back on a dysphagia 2 diet and has had no further aspiration incidents since admission.

## 2017-04-26 NOTE — Progress Notes (Addendum)
CSW working with pt wife for placement at [facility]  Wife reports that pt is out of SNF days through [facility] so would be unable to be placed using Medicare benefits- states [employer] Medicare kicks in 04/27/17- might could place pt under this benefit but spouse doesn't like any area facilities contracted with [employer] and even if she was agreeable would likely not be till Thursday until authorization would be received for any options  Wife request VA SNF placement- CSW explained to her that it was too late for VA to review request today and that they would not be open tomorrow for the holiday- CSW completed VA referral and faxed to screening committee- would not anticipate response until Wednesday  Even if pt gets approved for VA only 2 options that pt wife is agreeable to [facility] already confirmed that [facility] does not have availability at this time and soonest possible bed would be Thursday  CSW continuing to work on placement options  Jorge Ny, [place] Social Worker [phone]
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
## 2017-04-26 NOTE — NC FL2 (Addendum)
[place] LEVEL OF CARE SCREENING TOOL     IDENTIFICATION  Patient Name: Todd [name] Birthdate: January 18, 1946 Sex: male Admission Date (Current Location): 04/23/2017  [facility] and [place] Number:  Herbalist and Address:  [facility], [place] [address] [place],  [address]      Provider Number: [id number]  Attending Physician Name and Address:  [name], MD  Relative Name and Phone Number:       Current Level of Care: Hospital Recommended Level of Care: [place] Prior Approval Number:    Date Approved/Denied:   PASRR Number: [id number]  Discharge Plan: SNF    Current Diagnoses: Patient Active Problem List   Diagnosis Date Noted  . Ankle fracture, right, closed, initial encounter 04/24/2017  . Rib fracture 04/23/2017  . Travel advice encounter 03/12/2017  . Decreased grip strength of left hand 01/28/2017  . Long term (current) use of opiate analgesic 01/04/2017  . Weakness 12/09/2016  . Lumbar spinal stenosis   . Ataxia 10/28/2016  . Numbness 10/28/2016  . Numbness and tingling of both legs 10/26/2016  . Cough 10/22/2016  . Numbness and tingling 10/22/2016  . History of aspiration pneumonia 07/30/2016  . Gross hematuria 06/11/2016  . Sebaceous cyst 04/14/2016  . Allergic rhinitis 08/21/2015  . 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 Herpes zoster 03/29/2015  . Seizure disorder ([place]) 01/16/2015  . TBI (traumatic brain injury) ([place]) 01/16/2015  . Compression fracture of L2 lumbar vertebra ([place]) 11/20/2014  . Compression fracture of L4 lumbar vertebra ([place]) 11/20/2014  . Insomnia 11/02/2014  . CAD in native artery 10/24/2014  . Coronary artery calcification seen on CAT scan 10/24/2014  . Deviated septum 08/01/2014  . Hypoxia 05/18/2013  . Dysphagia 05/18/2013   . Chronic pulmonary aspiration 05/16/2013  . Abnormality of gait 05/01/2013  . Unspecified constipation 12/07/2012  . COPD (chronic obstructive pulmonary disease) ([place]) 08/17/2012  . Coronary atherosclerosis of native coronary artery 08/17/2012  . Generalized weakness 04/09/2012  . Osteoporosis with fracture 03/05/2011  . Preventive measure 01/26/2011  . Hyperlipidemia 04/07/2010  . Major depressive disorder, single episode, severe ([place]) 11/13/2009  . History of traumatic brain injury 06/17/2009  . LOSS, HEARING NOS 11/26/2006  . Aphasia 11/26/2006  . HEPATITIS C 08/10/2006  . PEPTIC ULCER DISEASE 08/10/2006  . Urinary incontinence 08/10/2006    Orientation RESPIRATION BLADDER Height & Weight        Normal Incontinent, External catheter Weight:   Height:     BEHAVIORAL SYMPTOMS/MOOD NEUROLOGICAL BOWEL NUTRITION STATUS      Incontinent Diet(see DC summary)  AMBULATORY STATUS COMMUNICATION OF NEEDS Skin   Extensive Assist Non-Verbally Normal                       Personal Care Assistance Level of Assistance  Bathing, Dressing, Feeding Bathing Assistance: Maximum assistance Feeding assistance: Maximum assistance Dressing Assistance: Maximum assistance     Functional Limitations Info             SPECIAL CARE FACTORS FREQUENCY  PT (By licensed PT), OT (By licensed OT)     PT Frequency: 5/wk OT Frequency: 5/wk  Speech: 3/wk          Contractures      Additional Factors Info  Code Status, Allergies, Psychotropic Code Status Info: FULL Allergies Info: Acetaminophen, Nsaids, Beta Adrenergic Blockers, Codeine Psychotropic  Info: cymbalta, lamictal         Current Medications (04/26/2017):  This is the current hospital active medication list Current Facility-Administered Medications  Medication Dose Route Frequency Provider Last Rate Last Dose  . acetaminophen (TYLENOL) tablet 500 mg  500 mg Oral Q6H PRN Katherine Roan, MD      . albuterol  (PROVENTIL) (2.5 MG/3ML) 0.083% nebulizer solution 2.5 mg  2.5 mg Nebulization Q6H PRN Velna Ochs, MD   2.5 mg at 04/24/17 1242  . aspirin chewable tablet 81 mg  81 mg Oral Daily Velna Ochs, MD   81 mg at 04/26/17 1038  . atorvastatin (LIPITOR) tablet 80 mg  80 mg Oral q1800 Velna Ochs, MD   80 mg at 04/25/17 1730  . B-complex with vitamin C tablet 1 tablet  1 tablet Oral Daily Velna Ochs, MD   1 tablet at 04/26/17 1032  . cholecalciferol (VITAMIN D) tablet 2,000 Units  2,000 Units Oral Daily Velna Ochs, MD   2,000 Units at 04/26/17 1035  . clopidogrel (PLAVIX) tablet 75 mg  75 mg Oral q morning - 10a Velna Ochs, MD   75 mg at 04/26/17 1036  . dextromethorphan-guaiFENesin (MUCINEX DM) 30-600 MG per 12 hr tablet 1 tablet  1 tablet Oral BID Katherine Roan, MD   1 tablet at 04/26/17 1033  . DULoxetine (CYMBALTA) DR capsule 30 mg  30 mg Oral Daily Velna Ochs, MD   30 mg at 04/26/17 1038  . enoxaparin (LOVENOX) injection 40 mg  40 mg Subcutaneous Q24H Velna Ochs, MD   40 mg at 04/25/17 1127  . famotidine (PEPCID) tablet 20 mg  20 mg Oral Daily Velna Ochs, MD   20 mg at 04/26/17 1038  . fluticasone (FLONASE) 50 MCG/ACT nasal spray 2 spray  2 spray Each Nare Daily Velna Ochs, MD   2 spray at 04/26/17 1040  . isosorbide mononitrate (IMDUR) 24 hr tablet 30 mg  30 mg Oral Daily Velna Ochs, MD   30 mg at 04/26/17 1037  . lamoTRIgine (LAMICTAL) tablet 150 mg  150 mg Oral BID [name], MD   150 mg at 04/26/17 1037  . loratadine (CLARITIN) tablet 10 mg  10 mg Oral QPM Velna Ochs, MD   10 mg at 04/25/17 1730  . multivitamin with minerals tablet 1 tablet  1 tablet Oral Daily Velna Ochs, MD   1 tablet at 04/26/17 1035  . naproxen (NAPROSYN) tablet 500 mg  500 mg Oral BID PRN Velna Ochs, MD      . oxyCODONE (Oxy IR/ROXICODONE) immediate release tablet 5 mg  5 mg Oral Q4H PRN Velna Ochs, MD   5 mg at  04/25/17 1730  . polyethylene glycol (MIRALAX / GLYCOLAX) packet 17 g  17 g Oral Daily PRN Velna Ochs, MD      . polyvinyl alcohol (LIQUIFILM TEARS) 1.4 % ophthalmic solution 1-2 drop  1-2 drop Both Eyes Daily Velna Ochs, MD   2 drop at 04/26/17 1039  . tiotropium (SPIRIVA) inhalation capsule 18 mcg  18 mcg Inhalation Daily Velna Ochs, MD   18 mcg at 04/24/17 1525  . vitamin C (ASCORBIC ACID) tablet 1,000 mg  1,000 mg Oral Daily Velna Ochs, MD   1,000 mg at 04/26/17 1033     Discharge Medications: Please see discharge summary for a list of discharge medications.  Relevant Imaging Results:  Relevant Lab Results:   Additional Information SS#: [id number]  Jorge Ny, LCSW

## 2017-04-27 DIAGNOSIS — M5136 Other intervertebral disc degeneration, lumbar region: Secondary | ICD-10-CM

## 2017-04-27 DIAGNOSIS — G40909 Epilepsy, unspecified, not intractable, without status epilepticus: Secondary | ICD-10-CM | POA: Diagnosis not present

## 2017-04-27 DIAGNOSIS — S82891A Other fracture of right lower leg, initial encounter for closed fracture: Secondary | ICD-10-CM

## 2017-04-27 DIAGNOSIS — S82841A Displaced bimalleolar fracture of right lower leg, initial encounter for closed fracture: Secondary | ICD-10-CM | POA: Diagnosis present

## 2017-04-27 DIAGNOSIS — Z79899 Other long term (current) drug therapy: Secondary | ICD-10-CM | POA: Diagnosis not present

## 2017-04-27 DIAGNOSIS — Z87820 Personal history of traumatic brain injury: Secondary | ICD-10-CM | POA: Diagnosis present

## 2017-04-27 DIAGNOSIS — Z7982 Long term (current) use of aspirin: Secondary | ICD-10-CM | POA: Diagnosis not present

## 2017-04-27 DIAGNOSIS — M48061 Spinal stenosis, lumbar region without neurogenic claudication: Secondary | ICD-10-CM

## 2017-04-27 DIAGNOSIS — R0902 Hypoxemia: Secondary | ICD-10-CM | POA: Diagnosis not present

## 2017-04-27 DIAGNOSIS — I69391 Dysphagia following cerebral infarction: Secondary | ICD-10-CM

## 2017-04-27 DIAGNOSIS — S2241XA Multiple fractures of ribs, right side, initial encounter for closed fracture: Secondary | ICD-10-CM | POA: Diagnosis present

## 2017-04-27 DIAGNOSIS — W1830XA Fall on same level, unspecified, initial encounter: Secondary | ICD-10-CM | POA: Diagnosis not present

## 2017-04-27 DIAGNOSIS — S82892A Other fracture of left lower leg, initial encounter for closed fracture: Secondary | ICD-10-CM | POA: Diagnosis present

## 2017-04-27 DIAGNOSIS — M6281 Muscle weakness (generalized): Secondary | ICD-10-CM | POA: Diagnosis not present

## 2017-04-27 DIAGNOSIS — Z9181 History of falling: Secondary | ICD-10-CM | POA: Diagnosis not present

## 2017-04-27 NOTE — Progress Notes (Signed)
[name] significant other reports that about an hour ago she thinks patient exhibited seizure-like symptoms. States that patient was staring off into space and not responding to her voice. Wife did not come get nurse at that time, she states that this is normal for him to do at times. Dr. Shan Levans notified of wife's report. Patient currently alert and eating dinner without issue. Will continue to monitor.
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
## 2017-04-27 NOTE — Progress Notes (Signed)
   Subjective: Patient in a good mood as always, sleepy today.  Denies pain or SOB.  Able to eat breakfast this morning without issues.   Objective:  Vital signs in last 24 hours: Vitals:   04/26/17 0330 04/26/17 1500 04/26/17 2347 04/27/17 0439  BP: 118/82 120/67 103/85 132/78  Pulse: 65 (!) 110 91 85  Resp: 18 18 17 17   Temp: 97.8 F (36.6 C) 98.2 F (36.8 C) 98.5 F (36.9 C) 98.8 F (37.1 C)  TempSrc: Oral Oral Axillary Axillary  SpO2: 98% 92% 94% 93%   Physical Exam  Constitutional: He appears well-developed and well-nourished.  HENT:  Head: Normocephalic and atraumatic.  Eyes: Right eye exhibits no discharge. Left eye exhibits no discharge. No scleral icterus.  Cardiovascular: Normal rate, regular rhythm, normal heart sounds and intact distal pulses. Exam reveals no gallop and no friction rub.  No murmur heard. Pulmonary/Chest: Effort normal. No accessory muscle usage. No respiratory distress. He has no wheezes. He has no rales.  Abdominal: Soft. Bowel sounds are normal. He exhibits no distension and no mass. There is no tenderness. There is no guarding.  Musculoskeletal:  Splint on R leg down to toes.  Toenails with good capillary refill.    Neurological: He is alert.   Assessment/Plan:  Active Problems:   History of traumatic brain injury   Hypoxia   Seizure disorder (HCC)   Rib fracture   Ankle fracture, right, closed, initial encounter   Bimalleolar fracture of right ankle, closed, initial encounter  R ankle fracture:  Pt with new physical therapist who dropped pt during therapy session, pt with known osteoporosis and R sided weakness from TBI.  -will keep splint and follow outpatient -Continue good pain control -PT/OT  Dysphagia: 2/2 TBI, choking episode involving brussel sprouts before admission, pt required heimlich   -Spoke with wife this evening who was uninterested in PEG tube placement -Pt was able to get speech eval yesterday evening -downgraded to  dysphagia 2 diet and honey thick liquids, has done very well on this diet, no choking episodes here  Seizure disorder: chronic,  2/2 TBI  -continue lamictal 100mg  BID  Hypoxia: pt with hypoxia this admission, thought to be more related to heimlich maneuver and associated broken ribs resulting in poor inspiratory effort.  Also seems based on physical and history that patient has aspirated a large amount of fluid.  Has COPD at baseline as well  -resolved, remains on room air with normal O2 saturation -diet is dysphagia 2 honey thick  Now, doing very well on this diet -mucinex, respiratory therapy performed deep suctioning yesterday afternoon sounds much better today -will continue home COPD inhalers  degenerative disc disease/spinal stenosis: chronic, a significant source of pain for the patient  -will continue pain managment   Dispo: Wife is working with Education officer, museum for SNF placement with Anticipated discharge 1-2 days Katherine Roan, MD 04/27/2017, 11:57 AM Vickki Muff MD PGY-1 Internal Medicine Pager # [phone]

## 2017-04-28 DIAGNOSIS — S2241XD Multiple fractures of ribs, right side, subsequent encounter for fracture with routine healing: Secondary | ICD-10-CM

## 2017-04-28 DIAGNOSIS — M80071D Age-related osteoporosis with current pathological fracture, right ankle and foot, subsequent encounter for fracture with routine healing: Secondary | ICD-10-CM

## 2017-04-28 DIAGNOSIS — X58XXXD Exposure to other specified factors, subsequent encounter: Secondary | ICD-10-CM

## 2017-04-28 DIAGNOSIS — R561 Post traumatic seizures: Secondary | ICD-10-CM

## 2017-04-28 DIAGNOSIS — S82841A Displaced bimalleolar fracture of right lower leg, initial encounter for closed fracture: Secondary | ICD-10-CM | POA: Diagnosis not present

## 2017-04-28 DIAGNOSIS — T17908D Unspecified foreign body in respiratory tract, part unspecified causing other injury, subsequent encounter: Secondary | ICD-10-CM

## 2017-04-28 MED ORDER — ACETAMINOPHEN 500 MG PO TABS: 500.0000 mg | ORAL_TABLET | Freq: Four times a day (QID) | ORAL | Status: DC | PRN

## 2017-04-28 MED ORDER — ALENDRONATE SODIUM 10 MG PO TABS: 70.0000 mg | ORAL_TABLET | ORAL | Status: DC

## 2017-04-28 NOTE — Progress Notes (Signed)
   Subjective: Mr. Scow was seen resting this morning resting in his bed with a paper towel in his mouth which he stated he uses for his drooling. The patient is not very communicative. He knew what his name was, but was not oriented to time.   Objective:  Vital signs in last 24 hours: Vitals:   04/27/17 1900 04/28/17 0500 04/28/17 1349 04/28/17 1352  BP: 112/73 125/67  114/63  Pulse: 82 69  95  Resp: 16 15  16   Temp: 98.1 F (36.7 C) 97.9 F (36.6 C)  97.7 F (36.5 C)  TempSrc: Oral Axillary  Axillary  SpO2: 93% 95% 94% 97%   Physical Exam  Constitutional: He appears well-developed and well-nourished. No distress.  HENT:  Head: Normocephalic and atraumatic.  Eyes: Conjunctivae are normal.  Cardiovascular: Normal rate, regular rhythm, normal heart sounds and intact distal pulses.  Respiratory: Effort normal and breath sounds normal.  GI: Soft. Bowel sounds are normal. He exhibits no distension. There is no tenderness.  Musculoskeletal:  [name] left ankle was wrapped  Neurological:  Not oriented to time (month)  Patient was able to follow commands and able to wiggle his toes  Skin: He is not diaphoretic.    Assessment/Plan:  Right ankle fracture  -PT and OT has recommended SNF with 24 hr supervision -continue vitamin d, b-complex with vitamin c, and fosamax for the patient's osteoporosis   Rib fracture Hypoxia  The patient had right 9th and 10th rib fractures post hemlich maneuver. The patient has known osteoporosis. -continue to monitor the patient's breathing and do pain control as needed Continue spiriva, flonase, proventil -Continue tylenol, naproxen, oxycodone 5mg  q4hrs prn for pain  Dysphagia  The patient has been having dysphagia post his prior TBI. He choked on brussel sprouts and when heimlich maneuver was done it caused rib fractures.   -Continue to dysphagia 2 diet   Seizure disorder secondary to TVI  -Continue lamictal 100mg  bid  Dispo:  Anticipated discharge in approximately 0-2 day(s). Social work is trying to find SNF for patient.   Lars Mage, MD Internal Medicine PGY1 [phone] 04/28/2017, 3:03 PM

## 2017-04-28 NOTE — Social Work (Signed)
CSW received call from [place] social worker, Administrator, sports. He confirmed benefits and indicated that the closest facilities that accept pt's VA benefits are: [facility] and [facility]. He indicated that CSW can send clinicals to the SNf's and he will send me another form now that family will need to complete to determine what the co-pay will be for SNF placement.  CSW then called [facility] admissions and left message requesting a call back to discuss.  CSW then called [facility] SNF and discussed case with [facility]. CSW faxed Aly[facility] Low clinicals to review.  CSW will continue to follow.  Elissa Hefty, LCSW Clinical Social Worker [phone]

## 2017-04-28 NOTE — Progress Notes (Signed)
Occupational Therapy Treatment Patient Details Name: Todd [name] MRN: [id number] DOB: 11-Jan-1946 Today's Date: 04/28/2017    History of present illness 72 year old male who presented to the ED due to chest pain anda fallresulting in R side rib fractures and R ankle fracture with non-operative treatment. PMHx significant for COPD, TBI with severe sequelae, bowel and bladder incontinence, vertebral fracture and Hep C.    OT comments  Patient is making gradual progress toward mobility goals. Pt required mod A for bed mobility to sit EOB and mod A initially for sitting balance EOB however progresses to min A -  min guard A. Pt sat EOB ~ 8 minutes. Pt required +3 for stand pivot EOB to recliner. Wife present throughout session. OT will continue to follow acutely   Follow Up Recommendations  SNF;Supervision/Assistance - 24 hour    Equipment Recommendations  None recommended by OT    Recommendations for Other Services      Precautions / Restrictions Precautions Precautions: Fall Restrictions Weight Bearing Restrictions: Yes RLE Weight Bearing: Non weight bearing Other Position/Activity Restrictions: No weight bearing status in orders but maintained RLE NWB throughout session.       Mobility Bed Mobility Overal bed mobility: Needs Assistance Bed Mobility: Supine to Sit     Supine to sit: Mod assist;HOB elevated Sit to supine: Mod assist   General bed mobility comments: cues for sequencing; use of rail and assist to elevate trunk into sitting and to scoot hips toward EOB with use of chuck pad  Transfers Overall transfer level: Needs assistance Equipment used: (gait belt and chuck pad) Transfers: Stand Pivot Transfers   Stand pivot transfers: Max assist;From elevated surface       General transfer comment: pt with posterior bias initially; trunk flexion worked on prior to attempt to transfer; pt is able to reach forward down to shins with multimodal cues; +2 assist to  power up into standing and pivot R foot and third person to [employer] R LE NWB    Balance Overall balance assessment: Needs assistance Sitting-balance support: Feet supported;Bilateral upper extremity supported Sitting balance-Leahy Scale: Poor Sitting balance - Comments: mod A initially due to heavy posterior bias; pt able to sit EOB ~8 mins to work on forward trunk flexion and able to maintian sitting balance with min guard  Postural control: Posterior lean                                 ADL either performed or assessed with clinical judgement   ADL Overall ADL's : Needs assistance/impaired                                       General ADL Comments: Pt currently overall max-total assist for all ADL. Pt sat EOB x 8 minutes with min A - min guard A for balance and safety     Vision Patient Visual Report: No change from baseline     Perception     Praxis      Cognition Arousal/Alertness: Awake/alert Behavior During Therapy: WFL for tasks assessed/performed Overall Cognitive Status: History of cognitive impairments - at baseline Area of Impairment: Following commands;Safety/judgement                       Following Commands: Follows one step commands inconsistently Safety/Judgement: Decreased awareness of  safety;Decreased awareness of deficits              Exercises     Shoulder Instructions       General Comments wife present    Pertinent Vitals/ Pain       Pain Assessment: No/denies pain Faces Pain Scale: No hurt Pain Intervention(s): Monitored during session  Home Living                                          Prior Functioning/Environment              Frequency  Min 2X/week        Progress Toward Goals  OT Goals(current goals can now be found in the care plan section)  Progress towards OT goals: Progressing toward goals  Acute Rehab OT Goals Patient Stated Goal: unable to state   Plan Discharge plan remains appropriate    Co-evaluation    PT/OT/SLP Co-Evaluation/Treatment: Yes Reason for Co-Treatment: Complexity of the patient's impairments (multi-system involvement);Necessary to address cognition/behavior during functional activity;For patient/therapist safety;To address functional/ADL transfers PT goals addressed during session: Mobility/safety with mobility;Balance OT goals addressed during session: ADL's and self-care      AM-PAC PT "6 Clicks" Daily Activity     Outcome Measure   Help from another person eating meals?: Total Help from another person taking care of personal grooming?: Total Help from another person toileting, which includes using toliet, bedpan, or urinal?: Total Help from another person bathing (including washing, rinsing, drying)?: Total Help from another person to put on and taking off regular upper body clothing?: Total Help from another person to put on and taking off regular lower body clothing?: Total 6 Click Score: 6    End of Session Equipment Utilized During Treatment: Gait belt  OT Visit Diagnosis: Other abnormalities of gait and mobility (R26.89);History of falling (Z91.81);Muscle weakness (generalized) (M62.81);Cognitive communication deficit (R41.841)   Activity Tolerance Patient tolerated treatment well   Patient Left in chair;with call bell/phone within reach;with chair alarm set;with family/visitor present   Nurse Communication      Functional Assessment Tool Used: AM-PAC 6 Clicks Daily Activity Functional Limitation: Self care Self Care Current Status (L9379): At least 80 percent but less than 100 percent impaired, limited or restricted Self Care Goal Status (K2409): At least 40 percent but less than 60 percent impaired, limited or restricted   Time: 1103-1130 OT Time Calculation (min): 27 min  Charges: OT G-codes **NOT FOR INPATIENT CLASS** Functional Assessment Tool Used: AM-PAC 6 Clicks Daily  Activity Functional Limitation: Self care Self Care Current Status (B3532): At least 80 percent but less than 100 percent impaired, limited or restricted Self Care Goal Status (D9242): At least 40 percent but less than 60 percent impaired, limited or restricted OT General Charges $OT Visit: 1 Visit OT Treatments $Therapeutic Activity: 8-22 mins     Britt Bottom 04/28/2017, 2:41 PM

## 2017-04-28 NOTE — Progress Notes (Signed)
Physical Therapy Treatment Patient Details Name: Todd [name] MRN: [id number] DOB: 07/12/1945 Today's Date: 04/28/2017    History of Present Illness 72 year old male who presented to the ED due to chest pain anda fallresulting in R side rib fractures and R ankle fracture with non-operative treatment. PMHx significant for COPD, TBI with severe sequelae, bowel and bladder incontinence, vertebral fracture and Hep C.     PT Comments    Patient is making gradual progress toward mobility goals. Pt required mod A for bed mobility and mod A initially for sitting balance EOB however progresses to min guard A. +3 for stand pivot EOB to recliner. Wife present throughout session. Current plan remains appropriate.    Follow Up Recommendations  SNF;Supervision/Assistance - 24 hour     Equipment Recommendations  Other (comment);Hospital bed(hoyer lift)    Recommendations for Other Services       Precautions / Restrictions Precautions Precautions: Fall Restrictions Weight Bearing Restrictions: Yes RLE Weight Bearing: Non weight bearing Other Position/Activity Restrictions: No weight bearing status in orders but maintained RLE NWB throughout session.    Mobility  Bed Mobility Overal bed mobility: Needs Assistance Bed Mobility: Supine to Sit     Supine to sit: Mod assist;HOB elevated     General bed mobility comments: cues for sequencing; use of rail and assist to elevate trunk into sitting and to scoot hips toward EOB with use of chuck pad  Transfers Overall transfer level: Needs assistance Equipment used: (gait belt and chuck pad) Transfers: Stand Pivot Transfers   Stand pivot transfers: Max assist;From elevated surface(+3)       General transfer comment: pt with posterior bias initially; trunk flexion worked on prior to attempt to transfer; pt is able to reach forward down to shins with multimodal cues; +2 assist to power up into standing and pivot R foot and third person to  [employer] R LE NWB  Ambulation/Gait             General Gait Details: unable   Stairs            Wheelchair Mobility    Modified Rankin (Stroke Patients Only)       Balance Overall balance assessment: Needs assistance Sitting-balance support: Feet supported;Bilateral upper extremity supported Sitting balance-Leahy Scale: Poor Sitting balance - Comments: mod A initially due to heavy posterior bias; pt able to sit EOB ~10 mins to work on forward trunk flexion and able to [employer] sitting balance with min guard  Postural control: Posterior lean                                  Cognition Arousal/Alertness: Awake/alert Behavior During Therapy: WFL for tasks assessed/performed Overall Cognitive Status: History of cognitive impairments - at baseline                                        Exercises      General Comments General comments (skin integrity, edema, etc.): wife present      Pertinent Vitals/Pain Pain Assessment: Faces Faces Pain Scale: No hurt    Home Living                      Prior Function            PT Goals (current goals can now be found  in the care plan section) Acute Rehab PT Goals Patient Stated Goal: unable to state PT Goal Formulation: With family Time For Goal Achievement: 05/01/17 Potential to Achieve Goals: Good Progress towards PT goals: Progressing toward goals    Frequency    Min 3X/week      PT Plan Current plan remains appropriate    Co-evaluation PT/OT/SLP Co-Evaluation/Treatment: Yes Reason for Co-Treatment: Complexity of the patient's impairments (multi-system involvement);Necessary to address cognition/behavior during functional activity;For patient/therapist safety;To address functional/ADL transfers PT goals addressed during session: Mobility/safety with mobility;Balance        AM-PAC PT "6 Clicks" Daily Activity  Outcome Measure  Difficulty turning over in bed  (including adjusting bedclothes, sheets and blankets)?: Unable Difficulty moving from lying on back to sitting on the side of the bed? : Unable Difficulty sitting down on and standing up from a chair with arms (e.g., wheelchair, bedside commode, etc,.)?: Unable Help needed moving to and from a bed to chair (including a wheelchair)?: A Lot Help needed walking in hospital room?: Total Help needed climbing 3-5 steps with a railing? : Total 6 Click Score: 7    End of Session Equipment Utilized During Treatment: Gait belt Activity Tolerance: Patient tolerated treatment well Patient left: in chair;with call bell/phone within reach;with chair alarm set;with family/visitor present Nurse Communication: Mobility status;Need for lift equipment PT Visit Diagnosis: Other abnormalities of gait and mobility (R26.89);Muscle weakness (generalized) (M62.81);History of falling (Z91.81);Difficulty in walking, not elsewhere classified (R26.2);Pain Hemiplegia - Right/Left: Right Pain - Right/Left: Right Pain - part of body: Ankle and joints of foot     Time: 0347-4259 PT Time Calculation (min) (ACUTE ONLY): 35 min  Charges:  $Therapeutic Activity: 23-37 mins                    G Codes:       Earney Navy, PTA Pager: [phone]     Darliss Cheney 04/28/2017, 1:47 PM

## 2017-04-28 NOTE — Social Work (Addendum)
CSW contacted spouse to discuss case. It appears that patient will have new insurance this year with [employer]. In addition, previous Education officer, museum indicated that spouse would like for patient to use his Veteran's benefits for short term rehab placement. CSW contacted the [place] and left message for case manager to call back and discuss.   CSW will continue to follow up as VA approval would be needed for skilled nursing placement.  11:59pm: CSW met with spouse at bedside to discuss disposition. Spouse indicated that she wants to use VA benefits. CSW explained that CSW has not been able to reach Case Manager at [facility] yet.  CSW discussed needing a back up plan and spouse indicated that patient now has [employer]. CSW advised patient that she needs to provide that to hospital registration so that they can replace/upload it in the system as the system still has [facility] listed. CSW discussed SNF placements that will take aetna medicare. Spouse indicated she has done her research and [facility] is in network.  Spouse indicated she will review her list and let CSW know of other A[employer]na Medicare facilities that she would be interested in for spouse. CSW will await for insurance information to be updated in system.  CSW contacted [facility] to see if they can offer a SNF bed. CSW f/u as SNF would need to initiate Authorization for approval for SNF placement.  Elissa Hefty, LCSW Clinical Social Worker [phone]

## 2017-04-28 NOTE — Social Work (Signed)
CSW received a call back from [place] requesting a form to be completed and faxed. CSW faxed from back to [place] and [place] will f/u with social worker on same.  CSW was advised that [facility] nor [facility] has an available SNF bed. Spouse indicated she does not want her spouse in any of the SNF's that are not good and will not sent him.  CSW is awaiting the outcome of the SNF bed at [facility], which is approved under VA benefits.  CSW will f/u.  Elissa Hefty, LCSW Clinical Social Worker [phone]

## 2017-04-28 NOTE — Social Work (Addendum)
CSW contacted the [place] again and left messages again for Burman Nieves and Carver Fila, Social Worker,s at [facility].  CSW will continue to follow up.  1:17pm: CSW called SNF at [facility] and they are reviewing clinicals and will let CSW know if they can offer. CSW advised that patient now has [employer] starting on 1/1.  CSW will continue to follow up.  2:30pm: CSW was advised by [facility] that they do not have male bed and they do not think they will have a male bed this week.   CSW called VA again and left message for another staff at the [place].  Elissa Hefty, LCSW Clinical Social Worker [phone]

## 2017-04-28 NOTE — Progress Notes (Signed)
  Date: 04/28/2017  Patient name: Todd [name]  Medical record number: [id number]  Date of birth: [birth year]   I have seen and evaluated this patient and I have discussed the plan of care with the house staff. Please see their note for complete details. I concur with their findings with the following additions/corrections:   No significant change in clinical status today.  He is tolerating his dysphagia diet and pain from his rib fractures and ankle fracture appears well controlled.  He continues to require 24-hour supervision and care, and our social work team continues to work with his spouse to find appropriate placement for him.  He is ready for discharge when a bed becomes available.  Oda Kilts, MD 04/28/2017, 5:54 PM

## 2017-04-29 DIAGNOSIS — S82841A Displaced bimalleolar fracture of right lower leg, initial encounter for closed fracture: Secondary | ICD-10-CM | POA: Diagnosis not present

## 2017-04-29 NOTE — Social Work (Signed)
1:29pm: CSW and RNCM met with partner and paitent at bedside and she was not pleased with the discussion earlier in which CSW advised that the [place] would not cover SNF placement, and that we may have to transition patient home if we cannot find a SNF that will take his Haematologist.  CSW and RNCM explained our role and the barriers with SNF placement. Partner was on the phone with Case Manager from the [employer]. VA indicated that they would re-review clinicals and get back to CSW with a facility that will accept patient for care.  Partner was adamant that she will not take patient home. CSW then asked partner about other SNF's that would take insurance and she indicated that she will not take patient to SNF with rating of a "2".  CSW validated her feelings and indicated that we will need a disposition plan if the VA is unable to approve patient for placement.  CSW will continue to follow up with VA.  Elissa Hefty, LCSW Clinical Social Worker [phone]  -

## 2017-04-29 NOTE — Care Management (Signed)
Case manager met with patient and Ms. Stallings to further discuss discharge plan. She is adamant that patient is not going to be discharged until the [place] has located a facility for patient. CM attempted to no avail to get a backup plan established. [name] said she will contact her attorney if we discharge him because he can not be cared for at home.

## 2017-04-29 NOTE — Progress Notes (Signed)
   04/24/17 1408  SLP G-Codes **NOT FOR INPATIENT CLASS**  Functional Assessment Tool Used skilled clinical judgment  Functional Limitations Swallowing  Swallow Current Status (Z6109) CK  Swallow Goal Status (U0454) CK  Swallow Discharge Status (U9811) CK   Deneise Lever, MS, Actor

## 2017-04-29 NOTE — Care Management (Signed)
Case manager called Todd [name] to discuss backup plan for husband's discharge, left message, will continue to follow.

## 2017-04-29 NOTE — Progress Notes (Signed)
  Date: 04/29/2017  Patient name: Todd [name]  Medical record number: [id number]  Date of birth: 05-01-1945   I have seen and evaluated this patient and I have discussed the plan of care with the house staff. Please see their note for complete details.  Oda Kilts, MD 04/29/2017, 5:10 PM

## 2017-04-29 NOTE — Care Management (Signed)
Case manager spoke with Medical Advisor, Dr. Reynaldo Minium concerning patient, have updated him on situation with patient.   3:35pm  Case manager received call from [place], Education officer, museum at the [place], he had questions concerning patient being Observation versus inpatient. CM explained Observation status. They are working on securing a bed for patient.

## 2017-04-29 NOTE — Social Work (Signed)
CSW called VA benefits this morning and left message for case manager to return call.  CSW then called admissions at [facility] to f/u on if they can offer SNF bed for patient.  CSW also f/u with [facility] and was unable to reach admission staff.  CSW will continue to follow up for SNF placement.  Elissa Hefty, LCSW Clinical Social Worker [phone]

## 2017-04-29 NOTE — Progress Notes (Signed)
   Subjective: Todd [name] was seen laying in his bed this morning doing well. He was not oriented to place or time, but knew his name.   Objective:  Vital signs in last 24 hours: Vitals:   04/28/17 1352 04/28/17 2024 04/29/17 0534 04/29/17 1417  BP: 114/63 (!) 153/122 119/76 104/70  Pulse: 95 (!) 171 74 93  Resp: 16   16  Temp: 97.7 F (36.5 C) 98.2 F (36.8 C) 98.5 F (36.9 C) 97.8 F (36.6 C)  TempSrc: Axillary Oral Oral Oral  SpO2: 97% 94% 93% 95%   Physical Exam  Constitutional: He appears well-developed and well-nourished. No distress.  HENT:  Head: Normocephalic and atraumatic.  Eyes: Conjunctivae are normal.  Cardiovascular: Normal rate, regular rhythm, normal heart sounds and intact distal pulses.  Respiratory: Effort normal and breath sounds normal. No respiratory distress. He has no wheezes.  GI: Soft. Bowel sounds are normal. He exhibits no distension. There is no tenderness.  Neurological:  Not oriented to place or time Patient was able to lift bilateral legs and wiggle his toes  Skin: He is not diaphoretic.     Assessment/Plan:  Right ankle fracture  -continue to keep patient's right foot elevated and in splint -SNF placement per PT and OT -continue vitamin d, b-complex with vitamin c, and fosamax for the patient's osteoporosis   9th and 10th Rib fracture Hypoxia  The patient had right 9th and 10th rib fractures post hemlich maneuver.   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 Waiting for   Lars Mage, MD Internal Medicine  PGY1 Pager:[phone] 04/29/2017, 3:20 PM

## 2017-04-29 NOTE — Social Work (Addendum)
CSW returned call to [place], Education officer, museum at [place] and left message.  CSW will continue to follow up.  4:30pm: CSW received call from [place] at the [place]. He indicated that they are re-reviewing clinicals & placement options for patient and will let us know outcome tomorrow. CSW reiterated the observation status of the patient and the need to get him transitioned to a facility as soon as possible. He validated CSW concerns and will f/u tomorrow.  CSW advised Carver Fila that co-worker will assist him tomorrow and he can reach her at [place] phone number.  Elissa Hefty, LCSW Clinical Social Worker [phone]

## 2017-04-29 NOTE — Social Work (Signed)
10:17 am CSW called Christy at [facility] who confirmed that they are waiting to confirm VA benefits and still reviewing case. CSW discussed the plan for discharge today. SNF will call CSW as soon as she hears back.  10:20am CSW called [facility] to see if they can offer a bed. CSW left message with admission to f/u.  Elissa Hefty, LCSW Clinical Social Worker [phone]

## 2017-04-29 NOTE — Social Work (Addendum)
11:08 am called VA benefits and spoke with social worker who advised that patient will not be covered by VA benefits for this admission and indicated and he should go through his medical benefits. VA indicated they will contact patient's spouse and notify.  11:10am: CSW called spouse and left message requesting a call back.  11: 20am: CSW sent out to local SNF's to see if they will take patient new insurance Aetna Medicare and will take LOG.  11:30 am: CSW received call from [facility] advising that the [place] has not approved facility placement and patient shld go through medical benefits. Staff confirmed that they do not accept his insurance for placement.  CSW received declines for SNFs: [facility] (not in network with [employer]) and [facility], [facility] has no beds, [facility] has no beds. CSW still f/u on other placement.  11:53 am: [facility] advised they are not in network with [employer].  12:00am: [facility] declined to offer bed.  CSW and RNCM  spoke with Dr. Loni Muse to discuss case. He indicated that patient is not rehable and will not be approved for nursing placement by [facility]. He indicated that patient may have to go home; be approved for medicaid which will cover nursing facility placement (however, pt's monthly income would probably disqualify him however, family can still apply at social service) or private pay for nursing facility placement.  12:35pm: [facility] declined to offer a SNF bed as they do not have a bed available.  12:50pm: CSW called spouse back and she indicated that she will be at the hospital shortly as she was out shopping. CSW advised spouse that patient has been denied by [place] as they indicated that he should use his insurance benefits for placement. CSW advised that we need to discuss discharge as the expectation is for patient to discharge today.  CSW will f/u.  12:57pm: CSW was advised by [employer] that pt has out of network  benefits and it would be a 60/40 split for nursing placement. CSW will advise with spouse. Elissa Hefty, LCSW Clinical Social Worker [phone]

## 2017-04-30 DIAGNOSIS — W19XXXD Unspecified fall, subsequent encounter: Secondary | ICD-10-CM

## 2017-04-30 DIAGNOSIS — M6281 Muscle weakness (generalized): Secondary | ICD-10-CM | POA: Diagnosis not present

## 2017-04-30 DIAGNOSIS — R262 Difficulty in walking, not elsewhere classified: Secondary | ICD-10-CM | POA: Diagnosis not present

## 2017-04-30 DIAGNOSIS — S82841A Displaced bimalleolar fracture of right lower leg, initial encounter for closed fracture: Secondary | ICD-10-CM | POA: Diagnosis not present

## 2017-04-30 DIAGNOSIS — J9610 Chronic respiratory failure, unspecified whether with hypoxia or hypercapnia: Secondary | ICD-10-CM | POA: Diagnosis not present

## 2017-04-30 DIAGNOSIS — S82844D Nondisplaced bimalleolar fracture of right lower leg, subsequent encounter for closed fracture with routine healing: Secondary | ICD-10-CM | POA: Diagnosis not present

## 2017-04-30 DIAGNOSIS — Z9181 History of falling: Secondary | ICD-10-CM | POA: Diagnosis not present

## 2017-04-30 DIAGNOSIS — R279 Unspecified lack of coordination: Secondary | ICD-10-CM | POA: Diagnosis not present

## 2017-04-30 MED ORDER — OXYCODONE HCL 5 MG PO TABS: 2.5000 mg | ORAL_TABLET | Freq: Every day | ORAL | 0 refills | Status: AC | PRN

## 2017-04-30 MED ORDER — DOCUSATE SODIUM 100 MG PO CAPS: 100.0000 mg | ORAL_CAPSULE | Freq: Every day | ORAL | 0 refills | Status: AC | PRN

## 2017-04-30 MED ORDER — ACETAMINOPHEN 500 MG PO TABS: 500.0000 mg | ORAL_TABLET | Freq: Four times a day (QID) | ORAL | 0 refills | Status: AC | PRN

## 2017-04-30 MED ORDER — DOCUSATE SODIUM 100 MG PO CAPS
100.0000 mg | ORAL_CAPSULE | Freq: Every day | ORAL | Status: DC | PRN
  Administered 2017-04-30: 100 mg via ORAL
  Filled 2017-04-30: qty 1

## 2017-04-30 NOTE — Progress Notes (Signed)
Occupational Therapy Treatment Patient Details Name: Todd [name] MRN: [id number] DOB: 1945/07/14 Today's Date: 04/30/2017    History of present illness 72 year old male who presented to the ED due to chest pain anda fallresulting in R side rib fractures and R ankle fracture with non-operative treatment. PMHx significant for COPD, TBI with severe sequelae, bowel and bladder incontinence, vertebral fracture and Hep C.    OT comments  Wife very concerned with pending d/c SNF plans and [facility] during [facility]. Pt did complete sit<>stand x2 during session with L LE blocked and R LE elevated by therapist to prevent weight bearing. Pt able to initiate but unable to sustain. Pt motivated by need to void. Pt with pending d/c today per RN.   SW confirms d/c today to new facility wife approves "[facility]"    Follow Up Recommendations  SNF;Supervision/Assistance - 24 hour    Equipment Recommendations  None recommended by OT    Recommendations for Other Services      Precautions / Restrictions Precautions Precautions: Fall Restrictions Weight Bearing Restrictions: Yes RLE Weight Bearing: Non weight bearing Other Position/Activity Restrictions: No weight bearing status in orders but maintained RLE NWB throughout session.       Mobility Bed Mobility Overal bed mobility: Needs Assistance Bed Mobility: Rolling;Supine to Sit;Sit to Supine Rolling: Mod assist   Supine to sit: Max assist;+2 for physical assistance Sit to supine: Mod assist;+2 for physical assistance   General bed mobility comments: pt requires (A) to sequence supine - pt benefits from side lying into a flexed posture and then progressing to eob sitting. pt once eob provided reaching task to help with posture and total +2 max to total +2 min (A)   Transfers Overall transfer level: Needs assistance Equipment used: 2 person hand held assist Transfers: Sit to/from Stand Sit to Stand: +2 physical  assistance;Max assist         General transfer comment: pt with posterior lean and needed cues to anteriorly weight shift. pt unable to maintain NWB without extensive (A) from [employer]arpist. pt reports "his feet are sliding" Pt with extensor tone in standing.     Balance Overall balance assessment: Needs assistance Sitting-balance support: Feet supported;Bilateral upper extremity supported Sitting balance-Leahy Scale: Zero Sitting balance - Comments: pt with increased extensor tone and back pain; +2 assist to maintain sitting EOB                                    ADL either performed or assessed with clinical judgement   ADL Overall ADL's : Needs assistance/impaired Eating/Feeding: Moderate assistance Eating/Feeding Details (indicate cue type and reason): lunch tray arriving during session. Wife advocating for more minced meal to help prevent aspiration   Grooming Details (indicate cue type and reason): total (A) with [place] to suction mouth                               General ADL Comments: focus of session was to help break up tone and progress to EOB. RN requesting patient not transfer to chair at this time. Pt agreeable to working wtih therapy     Vision       Perception     Praxis      Cognition Arousal/Alertness: Awake/alert Behavior During Therapy: WFL for tasks assessed/performed Overall Cognitive Status: History of cognitive impairments -  at baseline Area of Impairment: Following commands;Safety/judgement                       Following Commands: Follows one step commands with increased time Safety/Judgement: Decreased awareness of safety;Decreased awareness of deficits     General Comments: Pt following commands with incr time and needed multi cues to help with processing. pt joking this session by attempting to pop wife on the booty in jest        Exercises     Shoulder Instructions       General Comments pt  positioned on bed pan due to request to void bowels with medication provided previously by RN per wife. Staff aware of patients need to be transfered off bed pan. pt remains supine at this time in chair position to void    Pertinent Vitals/ Pain       Pain Assessment: Faces Faces Pain Scale: Hurts little more Pain Location: back Pain Descriptors / Indicators: Grimacing;Guarding;Sore Pain Intervention(s): Monitored during session;Premedicated before session;Repositioned  Home Living                                          Prior Functioning/Environment              Frequency  Min 2X/week        Progress Toward Goals  OT Goals(current goals can now be found in the care plan section)  Progress towards OT goals: Progressing toward goals  Acute Rehab OT Goals Patient Stated Goal: unable to state OT Goal Formulation: With family Time For Goal Achievement: 05/08/17 Potential to Achieve Goals: Good ADL Goals Pt Will Perform Eating: with min assist;sitting Pt Will Transfer to Toilet: with max assist;stand pivot transfer;bedside commode Additional ADL Goal #1: Pt will tolerate sitting EOB x10 mintues with min assist. Additional ADL Goal #2: Pt will perform bed mobility with min assist as precursor to ADL.  Plan Discharge plan remains appropriate    Co-evaluation    PT/OT/SLP Co-Evaluation/Treatment: Yes Reason for Co-Treatment: Complexity of the patient's impairments (multi-system involvement);Necessary to address cognition/behavior during functional activity;For patient/therapist safety;To address functional/ADL transfers   OT goals addressed during session: ADL's and self-care;Proper use of Adaptive equipment and DME;Strengthening/ROM      AM-PAC PT "6 Clicks" Daily Activity     Outcome Measure   Help from another person eating meals?: Total Help from another person taking care of personal grooming?: Total Help from another person toileting, which  includes using toliet, bedpan, or urinal?: Total Help from another person bathing (including washing, rinsing, drying)?: Total Help from another person to put on and taking off regular upper body clothing?: Total Help from another person to put on and taking off regular lower body clothing?: Total 6 Click Score: 6    End of Session Equipment Utilized During Treatment: Gait belt  OT Visit Diagnosis: Other abnormalities of gait and mobility (R26.89);History of falling (Z91.81);Muscle weakness (generalized) (M62.81);Cognitive communication deficit (R41.841)   Activity Tolerance Patient tolerated treatment well   Patient Left with call bell/phone within reach;in bed;with family/visitor present   Nurse Communication Mobility status;Need for lift equipment;Weight bearing status        Time: 1330-1400 OT Time Calculation (min): 30 min  Charges: OT General Charges $OT Visit: 1 Visit OT Treatments $Self Care/Home Management : 8-22 mins   Jeri Modena   OTR/L Pager:  [phone] Office: [phone] .    Parke Poisson B 04/30/2017, 3:26 PM

## 2017-04-30 NOTE — Progress Notes (Signed)
   Subjective: Todd [name] was seen resting in his bed with his wife at bedside. He mentioned that he had some lower back pain that is worse than the usual back pain that he experiences at home. The patient denies any chest discomfort or pain in his right foot. The patient's wife mentioned the importance of the patient being sent to a facility at discharge as the patient is not able to ambulate and has a need to   Objective:  Vital signs in last 24 hours: Vitals:   04/28/17 2024 04/29/17 0534 04/29/17 1417 04/29/17 2101  BP: (!) 153/122 119/76 104/70 111/73  Pulse: (!) 171 74 93 78  Resp:   16   Temp: 98.2 F (36.8 C) 98.5 F (36.9 C) 97.8 F (36.6 C) 98.9 F (37.2 C)  TempSrc: Oral Oral Oral Oral  SpO2: 94% 93% 95% 95%   Physical Exam  Constitutional: He appears well-developed and well-nourished. No distress.  HENT:  Head: Normocephalic and atraumatic.  Eyes: Conjunctivae are normal.  Cardiovascular: Normal rate, regular rhythm, normal heart sounds and intact distal pulses.  Pulmonary/Chest: Effort normal and breath sounds normal. No stridor. No respiratory distress.  Abdominal: Soft. Bowel sounds are normal. He exhibits no distension. There is no tenderness.  Neurological: He is alert.  Skin: He is not diaphoretic. No erythema.  Psychiatric: He has a normal mood and affect. Thought content normal.   Assessment/Plan:  Right ankle fracture The patient had a nondisplaced fracture of medial and lateral malleoli on the right foot fracture as seen on right ankle xray done 04/23/17.   -Encouraged to keep patient's right foot elevated and in splint -SNF placement per PT and OT-waiting disposition -continue vitamin d, b-complex with vitamin c, and fosamax for the patient's osteoporosis   9th and 10th Rib fracture Hypoxia  The patient had right 9th and 10th rib fractures post hemlich maneuver as noted on xray of right chest 04/23/17.  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   -Colace given    Dispo: Anticipated discharge in approximately 0-2 day(s).   Todd Mage, MD Internal Medicine PGY1 [phone] 04/30/2017, 10:02 AM

## 2017-04-30 NOTE — Clinical Social Work Note (Signed)
Clinical Social Worker facilitated patient discharge including contacting patient family and facility to confirm patient discharge plans.  Clinical information faxed to facility and family agreeable with plan.  CSW arranged ambulance transport via [place] to [facility].  RN to call [phone] for report prior to discharge.  Clinical Social Worker will sign off for now as social work intervention is no longer needed. Please consult [place] again if new need arises.  [place], [place]

## 2017-04-30 NOTE — Progress Notes (Signed)
Pt picked up by [facility]. IV taken out and pt transported via strecher by [place]. Phone charger sent with pt white in color.

## 2017-04-30 NOTE — Clinical Social Work Note (Signed)
Pt has a bed at [facility], private pay and pt will d/c today. MD please provide summary and order.   [place], [place]

## 2017-04-30 NOTE — Clinical Social Work Note (Signed)
Pt spouse has declined any facilities that would accept pt under a LOG. Social Work Surveyor, quantity spoke with pt's spouse via telephone--pt's spouse declined again. Pt's spouse is now stating she will private pay. Pt's spouse wants pt at [facility] or [facility]. CSW will reach out to facilities.   [place], [place]

## 2017-04-30 NOTE — Progress Notes (Signed)
Physical Therapy Treatment Patient Details Name: Todd [name] MRN: [id number] DOB: 03/05/1946 Today's Date: 04/30/2017    History of Present Illness 72 year old male who presented to the ED due to chest pain anda fallresulting in R side rib fractures and R ankle fracture with non-operative treatment. PMHx significant for COPD, TBI with severe sequelae, bowel and bladder incontinence, vertebral fracture and Hep C.     PT Comments    Patient required +2 assist for bed mobility and sit to stand transfer with therapist assisting to maintain R LE NWB status. Pt needs work on flexion prior to attempting transfers. Continue to progress as tolerated with anticipated d/c to SNF for further skilled PT services.    Follow Up Recommendations  SNF;Supervision/Assistance - 24 hour     Equipment Recommendations  Other (comment)(TBD next venue)    Recommendations for Other Services       Precautions / Restrictions Precautions Precautions: Fall Restrictions Weight Bearing Restrictions: Yes RLE Weight Bearing: Non weight bearing Other Position/Activity Restrictions: No weight bearing status in orders but maintained RLE NWB throughout session.    Mobility  Bed Mobility Overal bed mobility: Needs Assistance Bed Mobility: Rolling;Supine to Sit;Sit to Supine Rolling: Mod assist   Supine to sit: Max assist;+2 for physical assistance Sit to supine: Mod assist;+2 for physical assistance   General bed mobility comments: cues for sequencing and assist for rolling into sidelying to promote flexion and then to elevate trunk and bring legs off EOB  Transfers Overall transfer level: Needs assistance Equipment used: 2 person hand held assist Transfers: Sit to/from Stand Sit to Stand: +2 physical assistance;Max assist         General transfer comment: cues for bilat LE positioning prior to stand and for anterior translation of trunk; pt required max A to maintain NWB R LE in  standing  Ambulation/Gait                 Stairs            Wheelchair Mobility    Modified Rankin (Stroke Patients Only)       Balance Overall balance assessment: Needs assistance Sitting-balance support: Feet supported;Bilateral upper extremity supported Sitting balance-Leahy Scale: Zero Sitting balance - Comments: pt with increased extensor tone and back pain; +2 assist to maintain sitting EOB initially but able to progress to sitting EOB min guard A with work on trunk flexion                                     Cognition Arousal/Alertness: Awake/alert Behavior During Therapy: WFL for tasks assessed/performed Overall Cognitive Status: History of cognitive impairments - at baseline Area of Impairment: Following commands;Safety/judgement                       Following Commands: Follows one step commands with increased time Safety/Judgement: Decreased awareness of safety;Decreased awareness of deficits     General Comments: Pt following commands with incr time and needed multi cues to help with processing. pt joking this session by attempting to pop wife on the booty in [place] comments (skin integrity, edema, etc.): wife present during session; pt on bed pan end of session and nursing staff notified      Pertinent Vitals/Pain Pain Assessment: Faces Faces Pain Scale: Hurts little more Pain Location: back  Pain Descriptors / Indicators: Grimacing;Guarding;Sore Pain Intervention(s): Monitored during session;Premedicated before session;Repositioned    Home Living                      Prior Function            PT Goals (current goals can now be found in the care plan section) Acute Rehab PT Goals Patient Stated Goal: unable to state Progress towards PT goals: Progressing toward goals    Frequency    Min 3X/week      PT Plan Current plan remains appropriate     Co-evaluation PT/OT/SLP Co-Evaluation/Treatment: Yes Reason for Co-Treatment: Complexity of the patient's impairments (multi-system involvement);Necessary to address cognition/behavior during functional activity;For patient/therapist safety;To address functional/ADL transfers PT goals addressed during session: Mobility/safety with mobility;Balance;Strengthening/ROM OT goals addressed during session: ADL's and self-care;Proper use of Adaptive equipment and DME;Strengthening/ROM      AM-PAC PT "6 Clicks" Daily Activity  Outcome Measure  Difficulty turning over in bed (including adjusting bedclothes, sheets and blankets)?: Unable Difficulty moving from lying on back to sitting on the side of the bed? : Unable Difficulty sitting down on and standing up from a chair with arms (e.g., wheelchair, bedside commode, etc,.)?: Unable Help needed moving to and from a bed to chair (including a wheelchair)?: Total Help needed walking in hospital room?: Total Help needed climbing 3-5 steps with a railing? : Total 6 Click Score: 6    End of Session Equipment Utilized During Treatment: Gait belt Activity Tolerance: Patient tolerated treatment well Patient left: in bed;with call bell/phone within reach;with family/visitor present Nurse Communication: Mobility status;Need for lift equipment PT Visit Diagnosis: Other abnormalities of gait and mobility (R26.89);Muscle weakness (generalized) (M62.81);History of falling (Z91.81);Difficulty in walking, not elsewhere classified (R26.2);Pain Hemiplegia - Right/Left: Right Pain - Right/Left: Right Pain - part of body: Ankle and joints of foot     Time: 1330-1400 PT Time Calculation (min) (ACUTE ONLY): 30 min  Charges:                       G CodesEarney Navy, PTA Pager: [phone]     Darliss Cheney 04/30/2017, 4:32 PM

## 2017-04-30 NOTE — Discharge Summary (Signed)
Name: Todd [name] MRN: [id number] DOB: [birth year] 72 y.o. PCP: Aldine Contes, MD  Date of Admission: 04/23/2017  1:42 PM Date of Discharge: 04/30/2017 Attending Physician: Oda Kilts, MD  Discharge Diagnosis: Principal Problem:   Rib fracture Active Problems:   History of traumatic brain injury   Hypoxia   Seizure disorder ([place])   Ankle fracture, right, closed, initial encounter   Bimalleolar fracture of right ankle, closed, initial encounter   Closed fracture of left ankle   Discharge Medications: Allergies as of 04/30/2017      Reactions   Acetaminophen Other (See Comments)   Pt is unable to take this medication due to liver disease.     Nsaids Other (See Comments)   Pt is unable to take this medication due to liver disease.     Beta Adrenergic Blockers Other (See Comments)   Disorientation, dizziness   Codeine Hives      Medication List    STOP taking these medications   naproxen sodium 220 MG tablet Commonly known as:  ALEVE     TAKE these medications   acetaminophen 500 MG tablet Commonly known as:  TYLENOL Take 1 tablet (500 mg total) by mouth every 6 (six) hours as needed for mild pain or moderate pain.   acetaminophen 500 MG tablet Commonly known as:  TYLENOL Take 1 tablet (500 mg total) by mouth every 6 (six) hours as needed for mild pain.   albuterol 108 (90 Base) MCG/ACT inhaler Commonly known as:  PROAIR HFA inhale 2 puffs INTO THE LUNGS every 6 hours if needed for wheezing What changed:    how much to take  how to take this  when to take this  reasons to take this  additional instructions   alendronate 70 MG tablet Commonly known as:  FOSAMAX Take 1 tablet (70 mg total) by mouth once a week. Take with a full glass of water on an empty stomach. What changed:    when to take this  additional instructions   aspirin 81 MG chewable tablet Chew 81 mg by mouth daily.   atorvastatin 80 MG tablet Commonly known as:   LIPITOR Take 1 tablet (80 mg total) by mouth daily at 6 PM.   b complex vitamins tablet Take 1 tablet by mouth daily.   Biotin 1000 MCG tablet Take 1,000 mcg by mouth daily.   chlorpheniramine-HYDROcodone 10-8 MG/5ML Suer Commonly known as:  TUSSIONEX PENNKINETIC ER Take 5 mLs by mouth every 12 (twelve) hours as needed for cough.   clopidogrel 75 MG tablet Commonly known as:  PLAVIX take 1 tablet by mouth every morning   Co-Enzyme Q-10 100 MG Caps Take 100 mg by mouth daily.   desonide 0.05 % cream Commonly known as:  DESOWEN Apply 1 application topically 2 (two) times daily. Pt applies to hair line on top of head and around mustache   docusate sodium 100 MG capsule Commonly known as:  COLACE Take 1 capsule (100 mg total) by mouth daily as needed for mild constipation.   DULoxetine 30 MG capsule Commonly known as:  CYMBALTA Take 1 capsule (30 mg total) by mouth daily.   famotidine 20 MG tablet Commonly known as:  PEPCID Take 20 mg by mouth daily.   fluticasone 50 MCG/ACT nasal spray Commonly known as:  FLONASE instill 1 spray into each nostril once daily   guaifenesin 100 MG/5ML syrup Commonly known as:  ROBITUSSIN Take 30 mLs by mouth 3 (three) times daily as  needed for cough. What changed:  Another medication with the same name was removed. Continue taking this medication, and follow the directions you see here.   hydroxypropyl methylcellulose / hypromellose 2.5 % ophthalmic solution Commonly known as:  ISOPTO TEARS / GONIOVISC Place 1-2 drops into both eyes daily.   isosorbide mononitrate 30 MG 24 hr tablet Commonly known as:  IMDUR Take 1 tablet (30 mg total) by mouth daily.   ketotifen 0.025 % ophthalmic solution Commonly known as:  [employer] ANTIHISTAMINE EYE DROPS Place 1 drop into both eyes 2 (two) times daily. What changed:    when to take this  reasons to take this   lamoTRIgine 100 MG tablet Commonly known as:  LAMICTAL Take 1 tablet by mouth twice  daily. What changed:    how much to take  how to take this  when to take this  additional instructions   lamoTRIgine 25 MG tablet Commonly known as:  LAMICTAL Take 2 tablets, with 100mg  tablet, twice a day. What changed:    how much to take  how to take this  when to take this  additional instructions   levocetirizine 5 MG tablet Commonly known as:  XYZAL Take 5 mg by mouth every evening.   lidocaine 5 % Commonly known as:  LIDODERM Place 1 patch onto the skin daily. Pt applies to back.  Remove & Discard patch within 12 hours or as directed by MD   multivitamin with minerals Tabs tablet Take 1 tablet by mouth daily.   oxyCODONE 5 MG immediate release tablet Commonly known as:  Oxy IR/ROXICODONE Take 0.5 tablets (2.5 mg total) by mouth daily as needed for severe pain.   SPIRIVA RESPIMAT 2.5 MCG/ACT Aers Generic drug:  Tiotropium Bromide Monohydrate Inhale 2 puffs into the lungs daily.   vitamin C 1000 MG tablet Take 1,000 mg by mouth daily.   Vitamin D 2000 units tablet Take 2,000 Units by mouth daily.       Disposition and follow-up:   Todd [name] was discharged from [facility] in [place] condition.  At the hospital follow up visit please address:  1.  Ankle fracture and rib fractures - Nondisplaced closed fracture of the ankle. Follow up with Dr. Erlinda Hong outpatient. On treatment for osteoporosis.   2.  Labs / imaging needed at time of follow-up: None  3.  Pending labs/ test needing follow-up: None  Follow-up Appointments:  Contact information for follow-up providers    [facility] Follow up.   Specialty:  Home Health Services Contact information: [address] [place] [address] [phone]        orthopedic surgery Follow up in 4 week(s).   Why:  Placed a referral for pt to follow up with orthopedic surgery       Aldine Contes, MD Follow up in 1 month(s).   Specialty:  Internal  Medicine Why:  can follow up with PCP in one month or earlier if needed Contact information: [place], [address] [place] [address] [phone]            Contact information for after-discharge care    Destination    [facility] .   Service:  Skilled Nursing Contact information: [address] [place] [facility] Course by problem list: Principal Problem:   Rib fracture Active Problems:  History of traumatic brain injury   Hypoxia   Seizure disorder ([place])   Ankle fracture, right, closed, initial encounter   Bimalleolar fracture of right ankle, closed, initial encounter   Closed fracture of left ankle   Recurrent, traumatic, right ankle fracture following a fall He was noted to have fractured the medial and lateral malleoli on XR following mechanical fall. Continued vitamin d, b-complex with vitamin c, and fosamax for the patient's osteoporosis. Patient required SNF placement as he is unable to assist in any tranfers. Patient to follow-up w/ Dr. Erlinda Hong in the outpatient clinic following discharge. Splint was placed. Resume chronic home Oxycodone 2.5mg  q4hr prn for pain.  Rib fracture  The patient had right 9th and 10th rib fractures post hemlich maneuver.  Initially had mild hypoxia with oxygen requirment but this resolved. The patient has known osteoporosis. Continued spiriva, flonase, proventil.   Dysphagia The patient has been having dysphagia post his prior TBI. He choked on brussel sprouts and when heimlich maneuver was done it caused rib fractures. Continue dysphagia 2 diet.  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  Additionally, he will need to follow up with orthopedic surgery to manage his ankle fractures.  It will be important to follow the dysphagia 2 diet with honey thick due to chronic dysphagia with recent choking episode.   Increase activity slowly   Complete by:  As directed       Signed: Maryellen Pile, MD 04/30/2017, 2:56 PM   Pager: [phone]
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
## 2017-04-30 NOTE — Clinical Social Work Placement (Signed)
   CLINICAL SOCIAL WORK PLACEMENT  NOTE  Date:  04/30/2017  Patient Details  Name: Todd [name] MRN: [id number] Date of Birth: 1945-08-20  Clinical Social Work is seeking post-discharge placement for this patient at the [place] level of care (*CSW will initial, date and re-position this form in  chart as items are completed):      Patient/family provided with [facility] Work Department's list of facilities offering this level of care within the geographic area requested by the patient (or if unable, by the patient's family).  Yes   Patient/family informed of their freedom to choose among providers that offer the needed level of care, that participate in Medicare, Medicaid or managed care program needed by the patient, have an available bed and are willing to accept the patient.      Patient/family informed of [facility]'s ownership interest in [facility] and [facility], as well as of the fact that they are under no obligation to receive care at these facilities.  PASRR submitted to EDS on       PASRR number received on 05/27/17     Existing PASRR number confirmed on       FL2 transmitted to all facilities in geographic area requested by pt/family on 05/27/17     FL2 transmitted to all facilities within larger geographic area on       Patient informed that his/her managed care company has contracts with or will negotiate with certain facilities, including the following:        Yes   Patient/family informed of bed offers received.  Patient chooses bed at [facility]     Physician recommends and patient chooses bed at      Patient to be transferred to [facility] on 04/30/17.  Patient to be transferred to facility by [facility]AR     Patient family notified on 04/30/17 of transfer.  Name of family member notified:  [name]     PHYSICIAN Please prepare prescriptions     Additional Comment:     _______________________________________________ Eileen Stanford, LCSW 04/30/2017, 3:13 PM

## 2017-05-03 ENCOUNTER — Telehealth: Payer: Self-pay | Admitting: Internal Medicine

## 2017-05-03 DIAGNOSIS — J9610 Chronic respiratory failure, unspecified whether with hypoxia or hypercapnia: Secondary | ICD-10-CM | POA: Diagnosis not present

## 2017-05-03 DIAGNOSIS — R52 Pain, unspecified: Secondary | ICD-10-CM | POA: Diagnosis not present

## 2017-05-03 DIAGNOSIS — R1312 Dysphagia, oropharyngeal phase: Secondary | ICD-10-CM | POA: Diagnosis not present

## 2017-05-03 DIAGNOSIS — J690 Pneumonitis due to inhalation of food and vomit: Secondary | ICD-10-CM | POA: Diagnosis not present

## 2017-05-03 DIAGNOSIS — R279 Unspecified lack of coordination: Secondary | ICD-10-CM | POA: Diagnosis not present

## 2017-05-03 DIAGNOSIS — R69 Illness, unspecified: Secondary | ICD-10-CM | POA: Diagnosis not present

## 2017-05-03 DIAGNOSIS — F0150 Vascular dementia without behavioral disturbance: Secondary | ICD-10-CM | POA: Diagnosis not present

## 2017-05-03 DIAGNOSIS — J449 Chronic obstructive pulmonary disease, unspecified: Secondary | ICD-10-CM | POA: Diagnosis not present

## 2017-05-03 DIAGNOSIS — I2510 Atherosclerotic heart disease of native coronary artery without angina pectoris: Secondary | ICD-10-CM | POA: Diagnosis not present

## 2017-05-03 DIAGNOSIS — R296 Repeated falls: Secondary | ICD-10-CM | POA: Diagnosis not present

## 2017-05-03 DIAGNOSIS — R262 Difficulty in walking, not elsewhere classified: Secondary | ICD-10-CM | POA: Diagnosis not present

## 2017-05-03 DIAGNOSIS — M6281 Muscle weakness (generalized): Secondary | ICD-10-CM | POA: Diagnosis not present

## 2017-05-03 DIAGNOSIS — Z87820 Personal history of traumatic brain injury: Secondary | ICD-10-CM | POA: Diagnosis not present

## 2017-05-03 DIAGNOSIS — R1319 Other dysphagia: Secondary | ICD-10-CM | POA: Diagnosis not present

## 2017-05-03 DIAGNOSIS — G40901 Epilepsy, unspecified, not intractable, with status epilepticus: Secondary | ICD-10-CM | POA: Diagnosis not present

## 2017-05-03 DIAGNOSIS — S82844D Nondisplaced bimalleolar fracture of right lower leg, subsequent encounter for closed fracture with routine healing: Secondary | ICD-10-CM | POA: Diagnosis not present

## 2017-05-03 DIAGNOSIS — R278 Other lack of coordination: Secondary | ICD-10-CM | POA: Diagnosis not present

## 2017-05-03 DIAGNOSIS — S2241XD Multiple fractures of ribs, right side, subsequent encounter for fracture with routine healing: Secondary | ICD-10-CM | POA: Diagnosis not present

## 2017-05-03 DIAGNOSIS — S2249XS Multiple fractures of ribs, unspecified side, sequela: Secondary | ICD-10-CM | POA: Diagnosis not present

## 2017-05-03 DIAGNOSIS — Z9181 History of falling: Secondary | ICD-10-CM | POA: Diagnosis not present

## 2017-05-03 DIAGNOSIS — R0989 Other specified symptoms and signs involving the circulatory and respiratory systems: Secondary | ICD-10-CM | POA: Diagnosis not present

## 2017-05-03 DIAGNOSIS — S8261XA Displaced fracture of lateral malleolus of right fibula, initial encounter for closed fracture: Secondary | ICD-10-CM | POA: Diagnosis not present

## 2017-05-03 DIAGNOSIS — S90911A Unspecified superficial injury of right ankle, initial encounter: Secondary | ICD-10-CM | POA: Diagnosis not present

## 2017-05-03 DIAGNOSIS — R05 Cough: Secondary | ICD-10-CM | POA: Diagnosis not present

## 2017-05-03 DIAGNOSIS — S82841A Displaced bimalleolar fracture of right lower leg, initial encounter for closed fracture: Secondary | ICD-10-CM | POA: Diagnosis not present

## 2017-05-03 NOTE — Telephone Encounter (Signed)
FYI-[name] wife called to notify office patient has been admitted to [facility] and has to be redirected to a new [facility] due to [employer] and will f/u with our office after being D/C from the [employer].

## 2017-05-04 ENCOUNTER — Telehealth (INDEPENDENT_AMBULATORY_CARE_PROVIDER_SITE_OTHER): Payer: Self-pay | Admitting: Orthopaedic Surgery

## 2017-05-04 NOTE — Telephone Encounter (Signed)
Okay for Appt on Monday?

## 2017-05-04 NOTE — Telephone Encounter (Signed)
Patients wife called on behalf of husband and said that he was seen in the ER over the weekend for a fractured ankle and is currently staying at [facility], he has a splint on his ankle and I made an appointment for Monday but she was wondering if he should come in sooner than that or just keep it for Monday. CB # [phone]

## 2017-05-04 NOTE — Telephone Encounter (Signed)
It's ok to wait until monday

## 2017-05-05 DIAGNOSIS — S8261XA Displaced fracture of lateral malleolus of right fibula, initial encounter for closed fracture: Secondary | ICD-10-CM | POA: Diagnosis not present

## 2017-05-05 DIAGNOSIS — R296 Repeated falls: Secondary | ICD-10-CM | POA: Diagnosis not present

## 2017-05-05 DIAGNOSIS — M6281 Muscle weakness (generalized): Secondary | ICD-10-CM | POA: Diagnosis not present

## 2017-05-05 DIAGNOSIS — Z87820 Personal history of traumatic brain injury: Secondary | ICD-10-CM | POA: Diagnosis not present

## 2017-05-05 DIAGNOSIS — I2510 Atherosclerotic heart disease of native coronary artery without angina pectoris: Secondary | ICD-10-CM | POA: Diagnosis not present

## 2017-05-05 DIAGNOSIS — R52 Pain, unspecified: Secondary | ICD-10-CM | POA: Diagnosis not present

## 2017-05-05 DIAGNOSIS — R1319 Other dysphagia: Secondary | ICD-10-CM | POA: Diagnosis not present

## 2017-05-05 DIAGNOSIS — S2249XS Multiple fractures of ribs, unspecified side, sequela: Secondary | ICD-10-CM | POA: Diagnosis not present

## 2017-05-05 DIAGNOSIS — G40901 Epilepsy, unspecified, not intractable, with status epilepticus: Secondary | ICD-10-CM | POA: Diagnosis not present

## 2017-05-05 DIAGNOSIS — J449 Chronic obstructive pulmonary disease, unspecified: Secondary | ICD-10-CM | POA: Diagnosis not present

## 2017-05-05 NOTE — Progress Notes (Addendum)
Inserting G-Codes from evaluation May 03, 2017.     2017/05/14 1200  PT G-Codes **NOT FOR INPATIENT CLASS**  Functional Assessment Tool Used AM-PAC 6 Clicks Basic Mobility  Functional Limitation Mobility: Walking and moving around  Mobility: Walking and Moving Around Current Status (X8338) CM  Mobility: Walking and Moving Around Goal Status (S5053) CM  Mobility: Walking and Moving Around Discharge Status (Z7673)     Reinaldo Berber, PT, DPT Acute Rehab Services Pager: [phone]

## 2017-05-07 ENCOUNTER — Telehealth: Payer: Self-pay

## 2017-05-07 DIAGNOSIS — S8261XA Displaced fracture of lateral malleolus of right fibula, initial encounter for closed fracture: Secondary | ICD-10-CM | POA: Diagnosis not present

## 2017-05-07 DIAGNOSIS — G40901 Epilepsy, unspecified, not intractable, with status epilepticus: Secondary | ICD-10-CM | POA: Diagnosis not present

## 2017-05-07 DIAGNOSIS — Z87820 Personal history of traumatic brain injury: Secondary | ICD-10-CM | POA: Diagnosis not present

## 2017-05-07 DIAGNOSIS — M6281 Muscle weakness (generalized): Secondary | ICD-10-CM | POA: Diagnosis not present

## 2017-05-07 DIAGNOSIS — S2249XS Multiple fractures of ribs, unspecified side, sequela: Secondary | ICD-10-CM | POA: Diagnosis not present

## 2017-05-07 DIAGNOSIS — R52 Pain, unspecified: Secondary | ICD-10-CM | POA: Diagnosis not present

## 2017-05-07 DIAGNOSIS — R1319 Other dysphagia: Secondary | ICD-10-CM | POA: Diagnosis not present

## 2017-05-07 DIAGNOSIS — R296 Repeated falls: Secondary | ICD-10-CM | POA: Diagnosis not present

## 2017-05-07 DIAGNOSIS — J449 Chronic obstructive pulmonary disease, unspecified: Secondary | ICD-10-CM | POA: Diagnosis not present

## 2017-05-07 DIAGNOSIS — I2510 Atherosclerotic heart disease of native coronary artery without angina pectoris: Secondary | ICD-10-CM | POA: Diagnosis not present

## 2017-05-07 NOTE — Telephone Encounter (Signed)
Called spouse back, needs pharm to update immunizations, will call [facility]

## 2017-05-07 NOTE — Telephone Encounter (Signed)
Needs to speak with a nurse regarding flu shot. Please call back.

## 2017-05-10 ENCOUNTER — Encounter (INDEPENDENT_AMBULATORY_CARE_PROVIDER_SITE_OTHER): Payer: Self-pay | Admitting: Orthopaedic Surgery

## 2017-05-10 ENCOUNTER — Ambulatory Visit (INDEPENDENT_AMBULATORY_CARE_PROVIDER_SITE_OTHER): Payer: Medicare Other

## 2017-05-10 ENCOUNTER — Ambulatory Visit (INDEPENDENT_AMBULATORY_CARE_PROVIDER_SITE_OTHER): Payer: Medicare HMO | Admitting: Orthopaedic Surgery

## 2017-05-10 DIAGNOSIS — R52 Pain, unspecified: Secondary | ICD-10-CM | POA: Diagnosis not present

## 2017-05-10 DIAGNOSIS — I2510 Atherosclerotic heart disease of native coronary artery without angina pectoris: Secondary | ICD-10-CM | POA: Diagnosis not present

## 2017-05-10 DIAGNOSIS — M6281 Muscle weakness (generalized): Secondary | ICD-10-CM | POA: Diagnosis not present

## 2017-05-10 DIAGNOSIS — Z87820 Personal history of traumatic brain injury: Secondary | ICD-10-CM | POA: Diagnosis not present

## 2017-05-10 DIAGNOSIS — S82841A Displaced bimalleolar fracture of right lower leg, initial encounter for closed fracture: Secondary | ICD-10-CM

## 2017-05-10 DIAGNOSIS — S8261XA Displaced fracture of lateral malleolus of right fibula, initial encounter for closed fracture: Secondary | ICD-10-CM | POA: Diagnosis not present

## 2017-05-10 DIAGNOSIS — R1319 Other dysphagia: Secondary | ICD-10-CM | POA: Diagnosis not present

## 2017-05-10 DIAGNOSIS — G40901 Epilepsy, unspecified, not intractable, with status epilepticus: Secondary | ICD-10-CM | POA: Diagnosis not present

## 2017-05-10 DIAGNOSIS — J449 Chronic obstructive pulmonary disease, unspecified: Secondary | ICD-10-CM | POA: Diagnosis not present

## 2017-05-10 DIAGNOSIS — R296 Repeated falls: Secondary | ICD-10-CM | POA: Diagnosis not present

## 2017-05-10 DIAGNOSIS — S2249XS Multiple fractures of ribs, unspecified side, sequela: Secondary | ICD-10-CM | POA: Diagnosis not present
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
## 2017-05-17 DIAGNOSIS — R52 Pain, unspecified: Secondary | ICD-10-CM | POA: Diagnosis not present

## 2017-05-17 DIAGNOSIS — S2249XS Multiple fractures of ribs, unspecified side, sequela: Secondary | ICD-10-CM | POA: Diagnosis not present

## 2017-05-17 DIAGNOSIS — R296 Repeated falls: Secondary | ICD-10-CM | POA: Diagnosis not present

## 2017-05-17 DIAGNOSIS — I2510 Atherosclerotic heart disease of native coronary artery without angina pectoris: Secondary | ICD-10-CM | POA: Diagnosis not present

## 2017-05-17 DIAGNOSIS — R1319 Other dysphagia: Secondary | ICD-10-CM | POA: Diagnosis not present

## 2017-05-17 DIAGNOSIS — S8261XA Displaced fracture of lateral malleolus of right fibula, initial encounter for closed fracture: Secondary | ICD-10-CM | POA: Diagnosis not present

## 2017-05-17 DIAGNOSIS — M6281 Muscle weakness (generalized): Secondary | ICD-10-CM | POA: Diagnosis not present

## 2017-05-17 DIAGNOSIS — Z87820 Personal history of traumatic brain injury: Secondary | ICD-10-CM | POA: Diagnosis not present

## 2017-05-17 DIAGNOSIS — J449 Chronic obstructive pulmonary disease, unspecified: Secondary | ICD-10-CM | POA: Diagnosis not present

## 2017-05-17 DIAGNOSIS — G40901 Epilepsy, unspecified, not intractable, with status epilepticus: Secondary | ICD-10-CM | POA: Diagnosis not present

## 2017-05-25 DIAGNOSIS — R1319 Other dysphagia: Secondary | ICD-10-CM | POA: Diagnosis not present

## 2017-05-25 DIAGNOSIS — R0989 Other specified symptoms and signs involving the circulatory and respiratory systems: Secondary | ICD-10-CM | POA: Diagnosis not present

## 2017-05-25 DIAGNOSIS — R05 Cough: Secondary | ICD-10-CM | POA: Diagnosis not present

## 2017-06-07 ENCOUNTER — Telehealth: Payer: Self-pay | Admitting: *Deleted

## 2017-06-07 DIAGNOSIS — R1319 Other dysphagia: Secondary | ICD-10-CM | POA: Diagnosis not present

## 2017-06-07 DIAGNOSIS — S8261XA Displaced fracture of lateral malleolus of right fibula, initial encounter for closed fracture: Secondary | ICD-10-CM | POA: Diagnosis not present

## 2017-06-07 DIAGNOSIS — G40901 Epilepsy, unspecified, not intractable, with status epilepticus: Secondary | ICD-10-CM | POA: Diagnosis not present

## 2017-06-07 DIAGNOSIS — I2510 Atherosclerotic heart disease of native coronary artery without angina pectoris: Secondary | ICD-10-CM | POA: Diagnosis not present

## 2017-06-07 DIAGNOSIS — S2249XS Multiple fractures of ribs, unspecified side, sequela: Secondary | ICD-10-CM | POA: Diagnosis not present

## 2017-06-07 DIAGNOSIS — R52 Pain, unspecified: Secondary | ICD-10-CM | POA: Diagnosis not present

## 2017-06-07 DIAGNOSIS — J449 Chronic obstructive pulmonary disease, unspecified: Secondary | ICD-10-CM | POA: Diagnosis not present

## 2017-06-07 DIAGNOSIS — M6281 Muscle weakness (generalized): Secondary | ICD-10-CM | POA: Diagnosis not present

## 2017-06-07 DIAGNOSIS — R296 Repeated falls: Secondary | ICD-10-CM | POA: Diagnosis not present

## 2017-06-07 DIAGNOSIS — Z87820 Personal history of traumatic brain injury: Secondary | ICD-10-CM | POA: Diagnosis not present

## 2017-06-07 NOTE — Telephone Encounter (Signed)
Pt's sig other calls and states that she is in [place] and pt continues to recover from fall

## 2017-06-08 ENCOUNTER — Telehealth (INDEPENDENT_AMBULATORY_CARE_PROVIDER_SITE_OTHER): Payer: Self-pay | Admitting: Orthopaedic Surgery

## 2017-06-08 NOTE — Telephone Encounter (Signed)
See message below °

## 2017-06-08 NOTE — Telephone Encounter (Signed)
Patients wife called on behalf of patient asking for Dr. Erlinda Hong to contact [name] and to file an appeal with Medicare, since they are no longer covering his PT, even though he is non weight bearing. If you have any questions contact [name] at [phone]

## 2017-06-08 NOTE — Telephone Encounter (Signed)
Ok that's fine

## 2017-06-09 NOTE — Telephone Encounter (Signed)
Anne Ng what do we do next?  Does Dr Erlinda Hong need to write a letter of appeal?

## 2017-06-10 ENCOUNTER — Ambulatory Visit (INDEPENDENT_AMBULATORY_CARE_PROVIDER_SITE_OTHER): Payer: Medicare HMO

## 2017-06-10 ENCOUNTER — Encounter (INDEPENDENT_AMBULATORY_CARE_PROVIDER_SITE_OTHER): Payer: Self-pay | Admitting: Orthopaedic Surgery

## 2017-06-10 ENCOUNTER — Ambulatory Visit (INDEPENDENT_AMBULATORY_CARE_PROVIDER_SITE_OTHER): Payer: Medicare HMO | Admitting: Orthopaedic Surgery

## 2017-06-10 DIAGNOSIS — S82841A Displaced bimalleolar fracture of right lower leg, initial encounter for closed fracture: Secondary | ICD-10-CM | POA: Diagnosis not present

## 2017-06-10 DIAGNOSIS — R0989 Other specified symptoms and signs involving the circulatory and respiratory systems: Secondary | ICD-10-CM | POA: Diagnosis not present

## 2017-06-10 DIAGNOSIS — R1319 Other dysphagia: Secondary | ICD-10-CM | POA: Diagnosis not present

## 2017-06-10 DIAGNOSIS — R05 Cough: Secondary | ICD-10-CM | POA: Diagnosis not present
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
## 2017-06-14 DIAGNOSIS — Z9181 History of falling: Secondary | ICD-10-CM | POA: Diagnosis not present

## 2017-06-14 DIAGNOSIS — J9610 Chronic respiratory failure, unspecified whether with hypoxia or hypercapnia: Secondary | ICD-10-CM | POA: Diagnosis not present

## 2017-06-14 DIAGNOSIS — M6281 Muscle weakness (generalized): Secondary | ICD-10-CM | POA: Diagnosis not present

## 2017-06-14 DIAGNOSIS — S82844D Nondisplaced bimalleolar fracture of right lower leg, subsequent encounter for closed fracture with routine healing: Secondary | ICD-10-CM | POA: Diagnosis not present

## 2017-06-14 DIAGNOSIS — R279 Unspecified lack of coordination: Secondary | ICD-10-CM | POA: Diagnosis not present

## 2017-06-14 DIAGNOSIS — R262 Difficulty in walking, not elsewhere classified: Secondary | ICD-10-CM | POA: Diagnosis not present

## 2017-06-15 DIAGNOSIS — S8261XA Displaced fracture of lateral malleolus of right fibula, initial encounter for closed fracture: Secondary | ICD-10-CM | POA: Diagnosis not present

## 2017-06-15 DIAGNOSIS — R296 Repeated falls: Secondary | ICD-10-CM | POA: Diagnosis not present

## 2017-06-15 DIAGNOSIS — M6281 Muscle weakness (generalized): Secondary | ICD-10-CM | POA: Diagnosis not present

## 2017-06-17 DIAGNOSIS — H905 Unspecified sensorineural hearing loss: Secondary | ICD-10-CM | POA: Diagnosis not present

## 2017-06-23 DIAGNOSIS — R05 Cough: Secondary | ICD-10-CM | POA: Diagnosis not present

## 2017-06-23 DIAGNOSIS — K117 Disturbances of salivary secretion: Secondary | ICD-10-CM | POA: Diagnosis not present

## 2017-06-24 ENCOUNTER — Telehealth (INDEPENDENT_AMBULATORY_CARE_PROVIDER_SITE_OTHER): Payer: Self-pay | Admitting: Orthopaedic Surgery

## 2017-06-24 NOTE — Telephone Encounter (Signed)
See message below, idk what they are talking about.

## 2017-06-24 NOTE — Telephone Encounter (Signed)
Patients wife called needing another letter of appeal done for this patient, she says it needs to be complete tomorrow after 12 p.m. Please advise [name]  # [phone]

## 2017-06-25 DIAGNOSIS — Z9181 History of falling: Secondary | ICD-10-CM | POA: Diagnosis not present

## 2017-06-25 DIAGNOSIS — R279 Unspecified lack of coordination: Secondary | ICD-10-CM | POA: Diagnosis not present

## 2017-06-25 DIAGNOSIS — R262 Difficulty in walking, not elsewhere classified: Secondary | ICD-10-CM | POA: Diagnosis not present

## 2017-06-25 DIAGNOSIS — S82844D Nondisplaced bimalleolar fracture of right lower leg, subsequent encounter for closed fracture with routine healing: Secondary | ICD-10-CM | POA: Diagnosis not present

## 2017-06-25 DIAGNOSIS — J9610 Chronic respiratory failure, unspecified whether with hypoxia or hypercapnia: Secondary | ICD-10-CM | POA: Diagnosis not present

## 2017-06-25 DIAGNOSIS — M6281 Muscle weakness (generalized): Secondary | ICD-10-CM | POA: Diagnosis not present

## 2017-06-25 NOTE — Telephone Encounter (Signed)
  Called patients wife regarding this. She is demanding for this to be done today. I advised her this cannot be done today. DR Rosine Door are in Surgery.  She kept telling me that she had spoken to me previously about this, I advised her I didn't know what she was talking about I had never spoken to her regarding this. I am clueless on what info "she had talked to me about".   She then  states that she spoke to [place] Dr Phoebe Sharps PA.    Transferred call to Randall Hiss she no longer wanted to speak to me.

## 2017-06-25 NOTE — Telephone Encounter (Signed)
Unfortunately there's nothing I can do from an insurance stand point.  Those rules and guidelines are set by the government.  Also, I am not the supervising doctor.  The facility's doctor ultimately has the power to decide if the patient is safe for discharge from the facility or not.

## 2017-06-25 NOTE — Telephone Encounter (Signed)
[name] wife called again stating that the appeal needs to be done after 12p.m. Today.  This is needed so that the patient can continue getting services at [facility] and then it will be covered under Medicare.  His services will be ending tomorrow and he really needs to stay there and get services.  Dr. Erlinda Hong would need to call [place] and Cleda Mccreedy [phone], this is the number to get the correct fax and ask for Elenore Rota [id number].  Whoever answers the phone can look up Todd [name]'s notes and see that his wife called about the appeal.  The appeal can not go through the business office it will be too late.  This needs to be an expedited appeal.  CB#[phone] (Wife's cell).  Thank you.

## 2017-06-26 DIAGNOSIS — I2510 Atherosclerotic heart disease of native coronary artery without angina pectoris: Secondary | ICD-10-CM | POA: Diagnosis not present

## 2017-06-26 DIAGNOSIS — R69 Illness, unspecified: Secondary | ICD-10-CM | POA: Diagnosis not present

## 2017-06-26 DIAGNOSIS — R1319 Other dysphagia: Secondary | ICD-10-CM | POA: Diagnosis not present

## 2017-06-26 DIAGNOSIS — J449 Chronic obstructive pulmonary disease, unspecified: Secondary | ICD-10-CM | POA: Diagnosis not present

## 2017-06-26 DIAGNOSIS — R296 Repeated falls: Secondary | ICD-10-CM | POA: Diagnosis not present

## 2017-06-26 DIAGNOSIS — Z9181 History of falling: Secondary | ICD-10-CM | POA: Diagnosis not present

## 2017-06-26 DIAGNOSIS — L989 Disorder of the skin and subcutaneous tissue, unspecified: Secondary | ICD-10-CM | POA: Diagnosis not present

## 2017-06-26 DIAGNOSIS — S2241XD Multiple fractures of ribs, right side, subsequent encounter for fracture with routine healing: Secondary | ICD-10-CM | POA: Diagnosis not present

## 2017-06-26 DIAGNOSIS — S8261XA Displaced fracture of lateral malleolus of right fibula, initial encounter for closed fracture: Secondary | ICD-10-CM | POA: Diagnosis not present

## 2017-06-26 DIAGNOSIS — S82844D Nondisplaced bimalleolar fracture of right lower leg, subsequent encounter for closed fracture with routine healing: Secondary | ICD-10-CM | POA: Diagnosis not present

## 2017-06-26 DIAGNOSIS — Z87820 Personal history of traumatic brain injury: Secondary | ICD-10-CM | POA: Diagnosis not present

## 2017-06-26 DIAGNOSIS — J9610 Chronic respiratory failure, unspecified whether with hypoxia or hypercapnia: Secondary | ICD-10-CM | POA: Diagnosis not present

## 2017-06-26 DIAGNOSIS — R569 Unspecified convulsions: Secondary | ICD-10-CM | POA: Diagnosis not present

## 2017-06-26 DIAGNOSIS — F5105 Insomnia due to other mental disorder: Secondary | ICD-10-CM | POA: Diagnosis not present

## 2017-06-26 DIAGNOSIS — J9601 Acute respiratory failure with hypoxia: Secondary | ICD-10-CM | POA: Diagnosis not present

## 2017-06-26 DIAGNOSIS — R52 Pain, unspecified: Secondary | ICD-10-CM | POA: Diagnosis not present

## 2017-06-26 DIAGNOSIS — F0150 Vascular dementia without behavioral disturbance: Secondary | ICD-10-CM | POA: Diagnosis not present

## 2017-06-26 DIAGNOSIS — M6281 Muscle weakness (generalized): Secondary | ICD-10-CM | POA: Diagnosis not present

## 2017-06-26 DIAGNOSIS — S2249XS Multiple fractures of ribs, unspecified side, sequela: Secondary | ICD-10-CM | POA: Diagnosis not present

## 2017-06-26 DIAGNOSIS — R262 Difficulty in walking, not elsewhere classified: Secondary | ICD-10-CM | POA: Diagnosis not present

## 2017-06-26 DIAGNOSIS — R279 Unspecified lack of coordination: Secondary | ICD-10-CM | POA: Diagnosis not present

## 2017-06-26 DIAGNOSIS — R634 Abnormal weight loss: Secondary | ICD-10-CM | POA: Diagnosis not present

## 2017-06-26 DIAGNOSIS — R1312 Dysphagia, oropharyngeal phase: Secondary | ICD-10-CM | POA: Diagnosis not present

## 2017-07-01 DIAGNOSIS — R52 Pain, unspecified: Secondary | ICD-10-CM | POA: Diagnosis not present

## 2017-07-01 DIAGNOSIS — S8261XA Displaced fracture of lateral malleolus of right fibula, initial encounter for closed fracture: Secondary | ICD-10-CM | POA: Diagnosis not present

## 2017-07-01 DIAGNOSIS — S2249XS Multiple fractures of ribs, unspecified side, sequela: Secondary | ICD-10-CM | POA: Diagnosis not present

## 2017-07-06 DIAGNOSIS — I2510 Atherosclerotic heart disease of native coronary artery without angina pectoris: Secondary | ICD-10-CM | POA: Diagnosis not present

## 2017-07-06 DIAGNOSIS — M6281 Muscle weakness (generalized): Secondary | ICD-10-CM | POA: Diagnosis not present

## 2017-07-06 DIAGNOSIS — S2249XS Multiple fractures of ribs, unspecified side, sequela: Secondary | ICD-10-CM | POA: Diagnosis not present

## 2017-07-06 DIAGNOSIS — R296 Repeated falls: Secondary | ICD-10-CM | POA: Diagnosis not present

## 2017-07-06 DIAGNOSIS — J449 Chronic obstructive pulmonary disease, unspecified: Secondary | ICD-10-CM | POA: Diagnosis not present

## 2017-07-06 DIAGNOSIS — R569 Unspecified convulsions: Secondary | ICD-10-CM | POA: Diagnosis not present

## 2017-07-06 DIAGNOSIS — Z87820 Personal history of traumatic brain injury: Secondary | ICD-10-CM | POA: Diagnosis not present

## 2017-07-06 DIAGNOSIS — R1319 Other dysphagia: Secondary | ICD-10-CM | POA: Diagnosis not present

## 2017-07-06 DIAGNOSIS — S8261XA Displaced fracture of lateral malleolus of right fibula, initial encounter for closed fracture: Secondary | ICD-10-CM | POA: Diagnosis not present

## 2017-07-06 DIAGNOSIS — R634 Abnormal weight loss: Secondary | ICD-10-CM | POA: Diagnosis not present

## 2017-07-12 ENCOUNTER — Telehealth (INDEPENDENT_AMBULATORY_CARE_PROVIDER_SITE_OTHER): Payer: Self-pay | Admitting: Orthopaedic Surgery

## 2017-07-12 ENCOUNTER — Encounter (INDEPENDENT_AMBULATORY_CARE_PROVIDER_SITE_OTHER): Payer: Self-pay | Admitting: Orthopaedic Surgery

## 2017-07-12 ENCOUNTER — Encounter: Payer: Self-pay | Admitting: Internal Medicine

## 2017-07-12 DIAGNOSIS — L989 Disorder of the skin and subcutaneous tissue, unspecified: Secondary | ICD-10-CM | POA: Diagnosis not present

## 2017-07-12 NOTE — Telephone Encounter (Signed)
Randall Hiss, PT with [facility] needs clarification of orders for this patient, he needs to know if there are any orders for range of motion? Please advise # [phone]

## 2017-07-12 NOTE — Telephone Encounter (Signed)
Please advise 

## 2017-07-13 NOTE — Telephone Encounter (Signed)
Range of motion as tolerated. Ankle stabilization

## 2017-07-13 NOTE — Telephone Encounter (Signed)
Called Randall Hiss to advise.

## 2017-07-14 DIAGNOSIS — F0150 Vascular dementia without behavioral disturbance: Secondary | ICD-10-CM | POA: Diagnosis not present

## 2017-07-14 DIAGNOSIS — R69 Illness, unspecified: Secondary | ICD-10-CM | POA: Diagnosis not present

## 2017-07-14 DIAGNOSIS — F5105 Insomnia due to other mental disorder: Secondary | ICD-10-CM | POA: Diagnosis not present

## 2017-07-22 ENCOUNTER — Encounter (INDEPENDENT_AMBULATORY_CARE_PROVIDER_SITE_OTHER): Payer: Self-pay | Admitting: Orthopaedic Surgery

## 2017-07-22 ENCOUNTER — Ambulatory Visit (INDEPENDENT_AMBULATORY_CARE_PROVIDER_SITE_OTHER): Payer: Medicare HMO | Admitting: Orthopaedic Surgery

## 2017-07-22 ENCOUNTER — Ambulatory Visit (INDEPENDENT_AMBULATORY_CARE_PROVIDER_SITE_OTHER): Payer: Medicare HMO

## 2017-07-22 DIAGNOSIS — S82841A Displaced bimalleolar fracture of right lower leg, initial encounter for closed fracture: Secondary | ICD-10-CM
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
## 2017-07-26 ENCOUNTER — Ambulatory Visit (INDEPENDENT_AMBULATORY_CARE_PROVIDER_SITE_OTHER): Payer: Medicare HMO | Admitting: Internal Medicine

## 2017-07-26 ENCOUNTER — Telehealth: Payer: Self-pay

## 2017-07-26 VITALS — BP 102/61 | HR 63 | Wt 180.3 lb

## 2017-07-26 DIAGNOSIS — J449 Chronic obstructive pulmonary disease, unspecified: Secondary | ICD-10-CM

## 2017-07-26 DIAGNOSIS — R471 Dysarthria and anarthria: Secondary | ICD-10-CM | POA: Diagnosis not present

## 2017-07-26 DIAGNOSIS — R1310 Dysphagia, unspecified: Secondary | ICD-10-CM

## 2017-07-26 DIAGNOSIS — Z8669 Personal history of other diseases of the nervous system and sense organs: Secondary | ICD-10-CM

## 2017-07-26 DIAGNOSIS — Z87820 Personal history of traumatic brain injury: Secondary | ICD-10-CM | POA: Diagnosis not present

## 2017-07-26 DIAGNOSIS — Z7189 Other specified counseling: Secondary | ICD-10-CM | POA: Diagnosis not present

## 2017-07-26 DIAGNOSIS — Z8781 Personal history of (healed) traumatic fracture: Secondary | ICD-10-CM | POA: Diagnosis not present

## 2017-07-26 DIAGNOSIS — R269 Unspecified abnormalities of gait and mobility: Secondary | ICD-10-CM

## 2017-07-26 DIAGNOSIS — S069X9D Unspecified intracranial injury with loss of consciousness of unspecified duration, subsequent encounter: Secondary | ICD-10-CM

## 2017-07-26 DIAGNOSIS — Z7184 Encounter for health counseling related to travel: Secondary | ICD-10-CM

## 2017-07-26 NOTE — Telephone Encounter (Signed)
Would like to speak with Todd [name].  
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
## 2017-07-26 NOTE — Telephone Encounter (Signed)
I talked to her about this as well last month.

## 2017-07-26 NOTE — Patient Instructions (Signed)
It was great seeing you both today! I'm glad you are doing well!  I'm glad you had a great experience at your facility since your discharge. You look like you are feeling better since I last saw you!  Your check-up today went well, and I think you will be OK to travel this week. You both have done an excellent job thinking ahead and trying to plan out [name] healthcare while you are in [place].   I will touch base with Dr. Dareen Piano about PT/OT in [place]. It is likely that we are not able to order it here in [place].   Safe travels, and I look forward to seeing you when you return!

## 2017-07-26 NOTE — Telephone Encounter (Signed)
Wants someone to see pt that has seen him in the past, informed sig other that it is not always possible, she is agreeable

## 2017-07-27 NOTE — Assessment & Plan Note (Signed)
Todd [name] was pleasant and responded to questions appropriately. He appeared to be at his baseline and wife has not noted any unusual behavior. He has a wonderful care team including his wife and regular [place] aide. They inquired about [facility] services while they travel to [place], and note [name] is able to provide several months of "out of state" coverage for these services. They ask that I discuss this with his PCP, Dr. Dareen Piano. I will contact him regarding this request as they state he's helped them before.

## 2017-07-27 NOTE — Assessment & Plan Note (Addendum)
He and his wife are scheduled to travel to [place] this week and will be staying for several months. They emphasize they will not be giving up their [place] residency and will be returning to [facility] after their extended visit. They have already scheduled follow-up with a VA provider in [place] this week to ensure there is no lapse in care. Mr. Craighead seems to be doing well and is without complaint. I am overall impressed with the degree of thoughtful planing going into this trip, and suspect he will be OK to travel this week. I did emphasize the importance of [place], which I hope will help prevent dehydration, as well as having a several-day supply of medications on-hand should any issues arise with luggage.

## 2017-07-27 NOTE — Assessment & Plan Note (Signed)
Todd [name] was discharged from SNF over the weekend. He worked 5 days per week with PT/OT and seems to have rebuilt some strength. His wife inquires about PT/OT services while in [place] and ask that I contact his PCP, Dr. Dareen Piano, regarding this.

## 2017-07-29 NOTE — Progress Notes (Signed)
Internal Medicine Clinic Attending  Case discussed with Dr. Molt at the time of the visit.  We reviewed the resident's history and exam and pertinent patient test results.  I agree with the assessment, diagnosis, and plan of care documented in the resident's note. 

## 2017-08-06 ENCOUNTER — Telehealth: Payer: Self-pay

## 2017-08-06 ENCOUNTER — Encounter: Payer: Self-pay | Admitting: Internal Medicine

## 2017-08-06 NOTE — Telephone Encounter (Signed)
Pt sig other would like referral for PT/OT/Speech HH in [place], these are the 2 agencies that have been recommended:  [facility] [phone]  [facility] [place]

## 2017-08-06 NOTE — Telephone Encounter (Signed)
Would like to speak with Todd [name]. Please call back.  

## 2017-08-09 NOTE — Telephone Encounter (Signed)
I don't know how I can put it in for [facility]. They are following with a doctor at the [place] there who may be able to help

## 2017-08-11 NOTE — Telephone Encounter (Signed)
Called and informed caregiver, she will speak with VA in [place]

## 2017-09-01 ENCOUNTER — Other Ambulatory Visit: Payer: Self-pay | Admitting: Internal Medicine

## 2017-09-07 ENCOUNTER — Other Ambulatory Visit: Payer: Self-pay | Admitting: *Deleted

## 2017-09-07 MED ORDER — CLOPIDOGREL BISULFATE 75 MG PO TABS: 75.0000 mg | ORAL_TABLET | Freq: Every morning | ORAL | 0 refills | Status: AC

## 2017-09-14 DIAGNOSIS — I2510 Atherosclerotic heart disease of native coronary artery without angina pectoris: Secondary | ICD-10-CM | POA: Diagnosis not present

## 2017-09-14 DIAGNOSIS — I9589 Other hypotension: Secondary | ICD-10-CM | POA: Diagnosis not present

## 2017-09-14 DIAGNOSIS — G40909 Epilepsy, unspecified, not intractable, without status epilepticus: Secondary | ICD-10-CM | POA: Diagnosis not present

## 2017-09-14 DIAGNOSIS — S069X9S Unspecified intracranial injury with loss of consciousness of unspecified duration, sequela: Secondary | ICD-10-CM | POA: Diagnosis not present

## 2017-09-14 DIAGNOSIS — M810 Age-related osteoporosis without current pathological fracture: Secondary | ICD-10-CM | POA: Diagnosis not present

## 2017-09-14 DIAGNOSIS — M4856XS Collapsed vertebra, not elsewhere classified, lumbar region, sequela of fracture: Secondary | ICD-10-CM | POA: Diagnosis not present

## 2017-09-14 DIAGNOSIS — R296 Repeated falls: Secondary | ICD-10-CM | POA: Diagnosis not present

## 2017-09-14 DIAGNOSIS — M4854XS Collapsed vertebra, not elsewhere classified, thoracic region, sequela of fracture: Secondary | ICD-10-CM | POA: Diagnosis not present

## 2017-09-14 DIAGNOSIS — J439 Emphysema, unspecified: Secondary | ICD-10-CM | POA: Diagnosis not present

## 2017-09-14 DIAGNOSIS — M26609 Unspecified temporomandibular joint disorder, unspecified side: Secondary | ICD-10-CM | POA: Diagnosis not present

## 2017-09-15 ENCOUNTER — Telehealth: Payer: Self-pay | Admitting: Internal Medicine

## 2017-09-15 NOTE — Telephone Encounter (Signed)
Patient wife only wants to talk to [facility]

## 2017-09-15 NOTE — Telephone Encounter (Signed)
Thank you :)

## 2017-09-15 NOTE — Telephone Encounter (Signed)
Pt's caregiver/ sig other calls and states they have found a TEMPORARY PCP in [place], ms stallings states when they move back here dr Dareen Piano will be ask to assume role of pcp again. She states they are settling in and have started the process of PT, OT, Speech Therapy. And pcp is discussing some minor changes in care and meds.  She wishes dr Dareen Piano well and also the staff

## 2017-09-22 DIAGNOSIS — S22000S Wedge compression fracture of unspecified thoracic vertebra, sequela: Secondary | ICD-10-CM | POA: Diagnosis not present

## 2017-09-22 DIAGNOSIS — I9589 Other hypotension: Secondary | ICD-10-CM | POA: Diagnosis not present

## 2017-09-22 DIAGNOSIS — R1310 Dysphagia, unspecified: Secondary | ICD-10-CM | POA: Diagnosis not present

## 2017-09-22 DIAGNOSIS — G40909 Epilepsy, unspecified, not intractable, without status epilepticus: Secondary | ICD-10-CM | POA: Diagnosis not present

## 2017-09-22 DIAGNOSIS — S069X9S Unspecified intracranial injury with loss of consciousness of unspecified duration, sequela: Secondary | ICD-10-CM | POA: Diagnosis not present

## 2017-09-22 DIAGNOSIS — I2510 Atherosclerotic heart disease of native coronary artery without angina pectoris: Secondary | ICD-10-CM | POA: Diagnosis not present

## 2017-09-22 DIAGNOSIS — S32000S Wedge compression fracture of unspecified lumbar vertebra, sequela: Secondary | ICD-10-CM | POA: Diagnosis not present

## 2017-09-22 DIAGNOSIS — Z79891 Long term (current) use of opiate analgesic: Secondary | ICD-10-CM | POA: Diagnosis not present

## 2017-09-23 ENCOUNTER — Telehealth: Payer: Self-pay | Admitting: Neurology

## 2017-09-23 NOTE — Telephone Encounter (Signed)
Pt's wife [name] left a VM message saying pt is having mini seizures quite regularly and feels his Lamictal medication needs to be adjusted

## 2017-09-25 ENCOUNTER — Telehealth: Payer: Self-pay | Admitting: Internal Medicine

## 2017-09-25 DIAGNOSIS — H9209 Otalgia, unspecified ear: Secondary | ICD-10-CM | POA: Insufficient documentation

## 2017-09-25 MED ORDER — CIPROFLOXACIN-DEXAMETHASONE 0.3-0.1 % OT SUSP: 4.0000 [drp] | Freq: Two times a day (BID) | OTIC | 0 refills | Status: AC

## 2017-09-25 NOTE — Telephone Encounter (Signed)
   Reason for call:   I received a call from [name]'s wife at 2027 PM indicating he is having acute ear pain since earlier today.   Pertinent Data:   This problem is 72 years old with infrequent recurrences. She has been using otic drops for relief as needed but does not have these available any more.   He denies any hearing change, fevers, chills, sinus pain, or severe congestion.  Our office has never prescribed the drops she has been using previously.  They have called his local PCP but the [place] office as not returned any phone message by this time.   Assessment / Plan / Recommendations:   This sounds like possible acute otitis media or otitis externa without any systemic manifestation. He has no hearing change or fevers.  I will recommend starting a otic ciprodex drop BId x5 days. This avoids aminoglycoside exposure as I cannot definitively rule out tympanic membrane perforation.  I recommended follow up with the PCP office on Monday or Tuesday if symptoms are not resolving.  As always, pt is advised that if symptoms worsen or new symptoms arise, they should go to an urgent care facility or to to ER for further evaluation.   Collier Salina, MD   09/25/2017, 9:27 PM

## 2017-09-27 ENCOUNTER — Encounter: Payer: Self-pay | Admitting: Neurology

## 2017-09-27 NOTE — Telephone Encounter (Signed)
Pt's wife left a VM message saying she left a message last week and no one has called her back regarding pt having break through seizures and taking Lamictal, pt's spouse thinks the medication needs to be adjusted

## 2017-09-27 NOTE — Telephone Encounter (Signed)
Spoke with pt's significant other, [name], who states that pt is experiencing breakthrough seizures and she feels that his medication needs to be adjusted.  When I asked for specifics, [name] stated "I really just need to speak with Dr. Delice Lesch.  This is urgent.  Thank you" and call was disconnected.

## 2017-09-27 NOTE — Telephone Encounter (Signed)
Spoke to patient and wife, he is having spells, he starts feeling dizzy, then for 30 seconds he is able to continue what he is doing, he walks up then starts leaning back, eyes look glassy, not reactive, she has to tap him, lasts 30seconds. He has had 3 today, he has almost fallen but she has caught him, no jerking, no tongue bite or incontinence. Had one big one getting into bathroom, one when putting on briefs, when getting out of bathroom. Not all of them are due to movements, sometimes just sitting down. BP usually 106-108/70. OT has done the BP and BP goes down but not significantly. Symptoms sound like they are due to orthostasis, she will ask OT to do orthostatics and update [place]. If no significant change, we will try increasing lamictal and see how he does. Wife will email orthostatics.

## 2017-09-28 ENCOUNTER — Encounter: Payer: Self-pay | Admitting: Neurology

## 2017-09-29 ENCOUNTER — Telehealth: Payer: Self-pay

## 2017-09-29 DIAGNOSIS — S069X9S Unspecified intracranial injury with loss of consciousness of unspecified duration, sequela: Secondary | ICD-10-CM | POA: Diagnosis not present

## 2017-09-29 DIAGNOSIS — I2510 Atherosclerotic heart disease of native coronary artery without angina pectoris: Secondary | ICD-10-CM | POA: Diagnosis not present

## 2017-09-29 DIAGNOSIS — S32000S Wedge compression fracture of unspecified lumbar vertebra, sequela: Secondary | ICD-10-CM | POA: Diagnosis not present

## 2017-09-29 DIAGNOSIS — I9589 Other hypotension: Secondary | ICD-10-CM | POA: Diagnosis not present

## 2017-09-29 DIAGNOSIS — G40909 Epilepsy, unspecified, not intractable, without status epilepticus: Secondary | ICD-10-CM | POA: Diagnosis not present

## 2017-09-29 DIAGNOSIS — R1310 Dysphagia, unspecified: Secondary | ICD-10-CM | POA: Diagnosis not present

## 2017-09-29 DIAGNOSIS — Z79891 Long term (current) use of opiate analgesic: Secondary | ICD-10-CM | POA: Diagnosis not present

## 2017-09-29 DIAGNOSIS — S22000S Wedge compression fracture of unspecified thoracic vertebra, sequela: Secondary | ICD-10-CM | POA: Diagnosis not present

## 2017-09-29 NOTE — Telephone Encounter (Signed)
Pt's sig other calls and states pt is having "little seizures/ stokes"??? She states that she has been in touch w/ dr aquino's office and changing lamictal and possibly lowering the isosorbide. She ask that I speak to dr Dareen Piano about doing this via ph. She is informed that dr Dareen Piano is away and cannot speak to him. She also is informed that pt would need eval possibly lab work and other diag test to determine the reason, cause. That dr Dareen Piano would request and I feel sure that the attendings will request that the md that pt is seeing in [place] eval and decide the course. She is agreeable. Do you agree?

## 2017-09-29 NOTE — Telephone Encounter (Signed)
Requesting to speak with a nurse about meds. Please call back.  

## 2017-09-29 NOTE — Telephone Encounter (Signed)
Agree, he needs to be evaluated in person. Thanks!

## 2017-09-30 ENCOUNTER — Encounter: Payer: Self-pay | Admitting: Internal Medicine

## 2017-09-30 ENCOUNTER — Encounter: Payer: Self-pay | Admitting: Neurology

## 2017-09-30 DIAGNOSIS — S22000S Wedge compression fracture of unspecified thoracic vertebra, sequela: Secondary | ICD-10-CM | POA: Diagnosis not present

## 2017-09-30 DIAGNOSIS — I9589 Other hypotension: Secondary | ICD-10-CM | POA: Diagnosis not present

## 2017-09-30 DIAGNOSIS — S069X9S Unspecified intracranial injury with loss of consciousness of unspecified duration, sequela: Secondary | ICD-10-CM | POA: Diagnosis not present

## 2017-09-30 DIAGNOSIS — R1310 Dysphagia, unspecified: Secondary | ICD-10-CM | POA: Diagnosis not present

## 2017-09-30 DIAGNOSIS — S32000S Wedge compression fracture of unspecified lumbar vertebra, sequela: Secondary | ICD-10-CM | POA: Diagnosis not present

## 2017-09-30 DIAGNOSIS — G40909 Epilepsy, unspecified, not intractable, without status epilepticus: Secondary | ICD-10-CM | POA: Diagnosis not present

## 2017-09-30 DIAGNOSIS — I2510 Atherosclerotic heart disease of native coronary artery without angina pectoris: Secondary | ICD-10-CM | POA: Diagnosis not present

## 2017-09-30 DIAGNOSIS — Z79891 Long term (current) use of opiate analgesic: Secondary | ICD-10-CM | POA: Diagnosis not present

## 2017-10-01 DIAGNOSIS — Z79891 Long term (current) use of opiate analgesic: Secondary | ICD-10-CM | POA: Diagnosis not present

## 2017-10-01 DIAGNOSIS — I9589 Other hypotension: Secondary | ICD-10-CM | POA: Diagnosis not present

## 2017-10-01 DIAGNOSIS — I2510 Atherosclerotic heart disease of native coronary artery without angina pectoris: Secondary | ICD-10-CM | POA: Diagnosis not present

## 2017-10-01 DIAGNOSIS — G40909 Epilepsy, unspecified, not intractable, without status epilepticus: Secondary | ICD-10-CM | POA: Diagnosis not present

## 2017-10-01 DIAGNOSIS — S22000S Wedge compression fracture of unspecified thoracic vertebra, sequela: Secondary | ICD-10-CM | POA: Diagnosis not present

## 2017-10-01 DIAGNOSIS — S069X9S Unspecified intracranial injury with loss of consciousness of unspecified duration, sequela: Secondary | ICD-10-CM | POA: Diagnosis not present

## 2017-10-01 DIAGNOSIS — R1310 Dysphagia, unspecified: Secondary | ICD-10-CM | POA: Diagnosis not present

## 2017-10-01 DIAGNOSIS — S32000S Wedge compression fracture of unspecified lumbar vertebra, sequela: Secondary | ICD-10-CM | POA: Diagnosis not present

## 2017-10-04 DIAGNOSIS — R1310 Dysphagia, unspecified: Secondary | ICD-10-CM | POA: Diagnosis not present

## 2017-10-04 DIAGNOSIS — I2510 Atherosclerotic heart disease of native coronary artery without angina pectoris: Secondary | ICD-10-CM | POA: Diagnosis not present

## 2017-10-04 DIAGNOSIS — Z79891 Long term (current) use of opiate analgesic: Secondary | ICD-10-CM | POA: Diagnosis not present

## 2017-10-04 DIAGNOSIS — I9589 Other hypotension: Secondary | ICD-10-CM | POA: Diagnosis not present

## 2017-10-04 DIAGNOSIS — S069X9S Unspecified intracranial injury with loss of consciousness of unspecified duration, sequela: Secondary | ICD-10-CM | POA: Diagnosis not present

## 2017-10-04 DIAGNOSIS — S22000S Wedge compression fracture of unspecified thoracic vertebra, sequela: Secondary | ICD-10-CM | POA: Diagnosis not present

## 2017-10-04 DIAGNOSIS — S32000S Wedge compression fracture of unspecified lumbar vertebra, sequela: Secondary | ICD-10-CM | POA: Diagnosis not present

## 2017-10-04 DIAGNOSIS — G40909 Epilepsy, unspecified, not intractable, without status epilepticus: Secondary | ICD-10-CM | POA: Diagnosis not present

## 2017-10-05 ENCOUNTER — Other Ambulatory Visit: Payer: Self-pay | Admitting: Internal Medicine

## 2017-10-05 NOTE — Telephone Encounter (Signed)
His PCP there should fill this

## 2017-10-06 DIAGNOSIS — Z79891 Long term (current) use of opiate analgesic: Secondary | ICD-10-CM | POA: Diagnosis not present

## 2017-10-06 DIAGNOSIS — S22000S Wedge compression fracture of unspecified thoracic vertebra, sequela: Secondary | ICD-10-CM | POA: Diagnosis not present

## 2017-10-06 DIAGNOSIS — R1310 Dysphagia, unspecified: Secondary | ICD-10-CM | POA: Diagnosis not present

## 2017-10-06 DIAGNOSIS — S069X9S Unspecified intracranial injury with loss of consciousness of unspecified duration, sequela: Secondary | ICD-10-CM | POA: Diagnosis not present

## 2017-10-06 DIAGNOSIS — I2510 Atherosclerotic heart disease of native coronary artery without angina pectoris: Secondary | ICD-10-CM | POA: Diagnosis not present

## 2017-10-06 DIAGNOSIS — I9589 Other hypotension: Secondary | ICD-10-CM | POA: Diagnosis not present

## 2017-10-06 DIAGNOSIS — G40909 Epilepsy, unspecified, not intractable, without status epilepticus: Secondary | ICD-10-CM | POA: Diagnosis not present

## 2017-10-06 DIAGNOSIS — S32000S Wedge compression fracture of unspecified lumbar vertebra, sequela: Secondary | ICD-10-CM | POA: Diagnosis not present

## 2017-10-08 DIAGNOSIS — S32000S Wedge compression fracture of unspecified lumbar vertebra, sequela: Secondary | ICD-10-CM | POA: Diagnosis not present

## 2017-10-08 DIAGNOSIS — S22000S Wedge compression fracture of unspecified thoracic vertebra, sequela: Secondary | ICD-10-CM | POA: Diagnosis not present

## 2017-10-08 DIAGNOSIS — S069X9S Unspecified intracranial injury with loss of consciousness of unspecified duration, sequela: Secondary | ICD-10-CM | POA: Diagnosis not present

## 2017-10-08 DIAGNOSIS — Z79891 Long term (current) use of opiate analgesic: Secondary | ICD-10-CM | POA: Diagnosis not present

## 2017-10-08 DIAGNOSIS — I9589 Other hypotension: Secondary | ICD-10-CM | POA: Diagnosis not present

## 2017-10-08 DIAGNOSIS — I2510 Atherosclerotic heart disease of native coronary artery without angina pectoris: Secondary | ICD-10-CM | POA: Diagnosis not present

## 2017-10-08 DIAGNOSIS — R1310 Dysphagia, unspecified: Secondary | ICD-10-CM | POA: Diagnosis not present

## 2017-10-08 DIAGNOSIS — G40909 Epilepsy, unspecified, not intractable, without status epilepticus: Secondary | ICD-10-CM | POA: Diagnosis not present

## 2017-10-12 DIAGNOSIS — S069X9S Unspecified intracranial injury with loss of consciousness of unspecified duration, sequela: Secondary | ICD-10-CM | POA: Diagnosis not present

## 2017-10-12 DIAGNOSIS — S32000S Wedge compression fracture of unspecified lumbar vertebra, sequela: Secondary | ICD-10-CM | POA: Diagnosis not present

## 2017-10-12 DIAGNOSIS — S22000S Wedge compression fracture of unspecified thoracic vertebra, sequela: Secondary | ICD-10-CM | POA: Diagnosis not present

## 2017-10-12 DIAGNOSIS — I2510 Atherosclerotic heart disease of native coronary artery without angina pectoris: Secondary | ICD-10-CM | POA: Diagnosis not present

## 2017-10-12 DIAGNOSIS — G40909 Epilepsy, unspecified, not intractable, without status epilepticus: Secondary | ICD-10-CM | POA: Diagnosis not present

## 2017-10-12 DIAGNOSIS — Z79891 Long term (current) use of opiate analgesic: Secondary | ICD-10-CM | POA: Diagnosis not present

## 2017-10-12 DIAGNOSIS — R1310 Dysphagia, unspecified: Secondary | ICD-10-CM | POA: Diagnosis not present

## 2017-10-12 DIAGNOSIS — I9589 Other hypotension: Secondary | ICD-10-CM | POA: Diagnosis not present

## 2017-10-13 DIAGNOSIS — I2510 Atherosclerotic heart disease of native coronary artery without angina pectoris: Secondary | ICD-10-CM | POA: Diagnosis not present

## 2017-10-13 DIAGNOSIS — R1310 Dysphagia, unspecified: Secondary | ICD-10-CM | POA: Diagnosis not present

## 2017-10-13 DIAGNOSIS — S32000S Wedge compression fracture of unspecified lumbar vertebra, sequela: Secondary | ICD-10-CM | POA: Diagnosis not present

## 2017-10-13 DIAGNOSIS — S069X9S Unspecified intracranial injury with loss of consciousness of unspecified duration, sequela: Secondary | ICD-10-CM | POA: Diagnosis not present

## 2017-10-13 DIAGNOSIS — G40909 Epilepsy, unspecified, not intractable, without status epilepticus: Secondary | ICD-10-CM | POA: Diagnosis not present

## 2017-10-13 DIAGNOSIS — S22000S Wedge compression fracture of unspecified thoracic vertebra, sequela: Secondary | ICD-10-CM | POA: Diagnosis not present

## 2017-10-13 DIAGNOSIS — Z79891 Long term (current) use of opiate analgesic: Secondary | ICD-10-CM | POA: Diagnosis not present

## 2017-10-13 DIAGNOSIS — I9589 Other hypotension: Secondary | ICD-10-CM | POA: Diagnosis not present

## 2017-10-15 DIAGNOSIS — I2510 Atherosclerotic heart disease of native coronary artery without angina pectoris: Secondary | ICD-10-CM | POA: Diagnosis not present

## 2017-10-15 DIAGNOSIS — G40909 Epilepsy, unspecified, not intractable, without status epilepticus: Secondary | ICD-10-CM | POA: Diagnosis not present

## 2017-10-15 DIAGNOSIS — S32000S Wedge compression fracture of unspecified lumbar vertebra, sequela: Secondary | ICD-10-CM | POA: Diagnosis not present

## 2017-10-15 DIAGNOSIS — R1310 Dysphagia, unspecified: Secondary | ICD-10-CM | POA: Diagnosis not present

## 2017-10-15 DIAGNOSIS — S22000S Wedge compression fracture of unspecified thoracic vertebra, sequela: Secondary | ICD-10-CM | POA: Diagnosis not present

## 2017-10-15 DIAGNOSIS — S069X9S Unspecified intracranial injury with loss of consciousness of unspecified duration, sequela: Secondary | ICD-10-CM | POA: Diagnosis not present

## 2017-10-15 DIAGNOSIS — I9589 Other hypotension: Secondary | ICD-10-CM | POA: Diagnosis not present

## 2017-10-15 DIAGNOSIS — Z79891 Long term (current) use of opiate analgesic: Secondary | ICD-10-CM | POA: Diagnosis not present

## 2017-10-18 DIAGNOSIS — S32000S Wedge compression fracture of unspecified lumbar vertebra, sequela: Secondary | ICD-10-CM | POA: Diagnosis not present

## 2017-10-18 DIAGNOSIS — S22000S Wedge compression fracture of unspecified thoracic vertebra, sequela: Secondary | ICD-10-CM | POA: Diagnosis not present

## 2017-10-18 DIAGNOSIS — Z79891 Long term (current) use of opiate analgesic: Secondary | ICD-10-CM | POA: Diagnosis not present

## 2017-10-18 DIAGNOSIS — R1310 Dysphagia, unspecified: Secondary | ICD-10-CM | POA: Diagnosis not present

## 2017-10-18 DIAGNOSIS — G40909 Epilepsy, unspecified, not intractable, without status epilepticus: Secondary | ICD-10-CM | POA: Diagnosis not present

## 2017-10-18 DIAGNOSIS — I9589 Other hypotension: Secondary | ICD-10-CM | POA: Diagnosis not present

## 2017-10-18 DIAGNOSIS — S069X9S Unspecified intracranial injury with loss of consciousness of unspecified duration, sequela: Secondary | ICD-10-CM | POA: Diagnosis not present

## 2017-10-18 DIAGNOSIS — I2510 Atherosclerotic heart disease of native coronary artery without angina pectoris: Secondary | ICD-10-CM | POA: Diagnosis not present

## 2017-10-20 DIAGNOSIS — S22000S Wedge compression fracture of unspecified thoracic vertebra, sequela: Secondary | ICD-10-CM | POA: Diagnosis not present

## 2017-10-20 DIAGNOSIS — Z79891 Long term (current) use of opiate analgesic: Secondary | ICD-10-CM | POA: Diagnosis not present

## 2017-10-20 DIAGNOSIS — R1310 Dysphagia, unspecified: Secondary | ICD-10-CM | POA: Diagnosis not present

## 2017-10-20 DIAGNOSIS — S069X9S Unspecified intracranial injury with loss of consciousness of unspecified duration, sequela: Secondary | ICD-10-CM | POA: Diagnosis not present

## 2017-10-20 DIAGNOSIS — I2510 Atherosclerotic heart disease of native coronary artery without angina pectoris: Secondary | ICD-10-CM | POA: Diagnosis not present

## 2017-10-20 DIAGNOSIS — S32000S Wedge compression fracture of unspecified lumbar vertebra, sequela: Secondary | ICD-10-CM | POA: Diagnosis not present

## 2017-10-20 DIAGNOSIS — I9589 Other hypotension: Secondary | ICD-10-CM | POA: Diagnosis not present

## 2017-10-20 DIAGNOSIS — G40909 Epilepsy, unspecified, not intractable, without status epilepticus: Secondary | ICD-10-CM | POA: Diagnosis not present

## 2017-10-22 DIAGNOSIS — Z79891 Long term (current) use of opiate analgesic: Secondary | ICD-10-CM | POA: Diagnosis not present

## 2017-10-22 DIAGNOSIS — I2510 Atherosclerotic heart disease of native coronary artery without angina pectoris: Secondary | ICD-10-CM | POA: Diagnosis not present

## 2017-10-22 DIAGNOSIS — S22000S Wedge compression fracture of unspecified thoracic vertebra, sequela: Secondary | ICD-10-CM | POA: Diagnosis not present

## 2017-10-22 DIAGNOSIS — I9589 Other hypotension: Secondary | ICD-10-CM | POA: Diagnosis not present

## 2017-10-22 DIAGNOSIS — S32000S Wedge compression fracture of unspecified lumbar vertebra, sequela: Secondary | ICD-10-CM | POA: Diagnosis not present

## 2017-10-22 DIAGNOSIS — G40909 Epilepsy, unspecified, not intractable, without status epilepticus: Secondary | ICD-10-CM | POA: Diagnosis not present

## 2017-10-22 DIAGNOSIS — S069X9S Unspecified intracranial injury with loss of consciousness of unspecified duration, sequela: Secondary | ICD-10-CM | POA: Diagnosis not present

## 2017-10-22 DIAGNOSIS — R1310 Dysphagia, unspecified: Secondary | ICD-10-CM | POA: Diagnosis not present

## 2017-10-26 DIAGNOSIS — Z79891 Long term (current) use of opiate analgesic: Secondary | ICD-10-CM | POA: Diagnosis not present

## 2017-10-26 DIAGNOSIS — R1310 Dysphagia, unspecified: Secondary | ICD-10-CM | POA: Diagnosis not present

## 2017-10-26 DIAGNOSIS — S32000S Wedge compression fracture of unspecified lumbar vertebra, sequela: Secondary | ICD-10-CM | POA: Diagnosis not present

## 2017-10-26 DIAGNOSIS — S22000S Wedge compression fracture of unspecified thoracic vertebra, sequela: Secondary | ICD-10-CM | POA: Diagnosis not present

## 2017-10-26 DIAGNOSIS — I9589 Other hypotension: Secondary | ICD-10-CM | POA: Diagnosis not present

## 2017-10-26 DIAGNOSIS — S069X9S Unspecified intracranial injury with loss of consciousness of unspecified duration, sequela: Secondary | ICD-10-CM | POA: Diagnosis not present

## 2017-10-26 DIAGNOSIS — I2510 Atherosclerotic heart disease of native coronary artery without angina pectoris: Secondary | ICD-10-CM | POA: Diagnosis not present

## 2017-10-26 DIAGNOSIS — G40909 Epilepsy, unspecified, not intractable, without status epilepticus: Secondary | ICD-10-CM | POA: Diagnosis not present

## 2017-10-27 DIAGNOSIS — G40909 Epilepsy, unspecified, not intractable, without status epilepticus: Secondary | ICD-10-CM | POA: Diagnosis not present

## 2017-10-27 DIAGNOSIS — R1310 Dysphagia, unspecified: Secondary | ICD-10-CM | POA: Diagnosis not present

## 2017-10-27 DIAGNOSIS — S32000S Wedge compression fracture of unspecified lumbar vertebra, sequela: Secondary | ICD-10-CM | POA: Diagnosis not present

## 2017-10-27 DIAGNOSIS — I2510 Atherosclerotic heart disease of native coronary artery without angina pectoris: Secondary | ICD-10-CM | POA: Diagnosis not present

## 2017-10-27 DIAGNOSIS — Z79891 Long term (current) use of opiate analgesic: Secondary | ICD-10-CM | POA: Diagnosis not present

## 2017-10-27 DIAGNOSIS — I9589 Other hypotension: Secondary | ICD-10-CM | POA: Diagnosis not present

## 2017-10-27 DIAGNOSIS — S22000S Wedge compression fracture of unspecified thoracic vertebra, sequela: Secondary | ICD-10-CM | POA: Diagnosis not present

## 2017-10-27 DIAGNOSIS — S069X9S Unspecified intracranial injury with loss of consciousness of unspecified duration, sequela: Secondary | ICD-10-CM | POA: Diagnosis not present

## 2017-10-28 DIAGNOSIS — I9589 Other hypotension: Secondary | ICD-10-CM | POA: Diagnosis not present

## 2017-10-28 DIAGNOSIS — G40909 Epilepsy, unspecified, not intractable, without status epilepticus: Secondary | ICD-10-CM | POA: Diagnosis not present

## 2017-10-28 DIAGNOSIS — S069X9S Unspecified intracranial injury with loss of consciousness of unspecified duration, sequela: Secondary | ICD-10-CM | POA: Diagnosis not present

## 2017-10-28 DIAGNOSIS — R1310 Dysphagia, unspecified: Secondary | ICD-10-CM | POA: Diagnosis not present

## 2017-10-28 DIAGNOSIS — S32000S Wedge compression fracture of unspecified lumbar vertebra, sequela: Secondary | ICD-10-CM | POA: Diagnosis not present

## 2017-10-28 DIAGNOSIS — Z79891 Long term (current) use of opiate analgesic: Secondary | ICD-10-CM | POA: Diagnosis not present

## 2017-10-28 DIAGNOSIS — I2510 Atherosclerotic heart disease of native coronary artery without angina pectoris: Secondary | ICD-10-CM | POA: Diagnosis not present

## 2017-10-28 DIAGNOSIS — S22000S Wedge compression fracture of unspecified thoracic vertebra, sequela: Secondary | ICD-10-CM | POA: Diagnosis not present

## 2017-10-29 DIAGNOSIS — R1310 Dysphagia, unspecified: Secondary | ICD-10-CM | POA: Diagnosis not present

## 2017-10-29 DIAGNOSIS — S069X9S Unspecified intracranial injury with loss of consciousness of unspecified duration, sequela: Secondary | ICD-10-CM | POA: Diagnosis not present

## 2017-10-29 DIAGNOSIS — S22000S Wedge compression fracture of unspecified thoracic vertebra, sequela: Secondary | ICD-10-CM | POA: Diagnosis not present

## 2017-10-29 DIAGNOSIS — I9589 Other hypotension: Secondary | ICD-10-CM | POA: Diagnosis not present

## 2017-10-29 DIAGNOSIS — I2510 Atherosclerotic heart disease of native coronary artery without angina pectoris: Secondary | ICD-10-CM | POA: Diagnosis not present

## 2017-10-29 DIAGNOSIS — G40909 Epilepsy, unspecified, not intractable, without status epilepticus: Secondary | ICD-10-CM | POA: Diagnosis not present

## 2017-10-29 DIAGNOSIS — S32000S Wedge compression fracture of unspecified lumbar vertebra, sequela: Secondary | ICD-10-CM | POA: Diagnosis not present

## 2017-10-29 DIAGNOSIS — Z79891 Long term (current) use of opiate analgesic: Secondary | ICD-10-CM | POA: Diagnosis not present

## 2017-11-01 DIAGNOSIS — Z79891 Long term (current) use of opiate analgesic: Secondary | ICD-10-CM | POA: Diagnosis not present

## 2017-11-01 DIAGNOSIS — S22000S Wedge compression fracture of unspecified thoracic vertebra, sequela: Secondary | ICD-10-CM | POA: Diagnosis not present

## 2017-11-01 DIAGNOSIS — S32000S Wedge compression fracture of unspecified lumbar vertebra, sequela: Secondary | ICD-10-CM | POA: Diagnosis not present

## 2017-11-01 DIAGNOSIS — R1310 Dysphagia, unspecified: Secondary | ICD-10-CM | POA: Diagnosis not present

## 2017-11-01 DIAGNOSIS — S069X9S Unspecified intracranial injury with loss of consciousness of unspecified duration, sequela: Secondary | ICD-10-CM | POA: Diagnosis not present

## 2017-11-01 DIAGNOSIS — I2510 Atherosclerotic heart disease of native coronary artery without angina pectoris: Secondary | ICD-10-CM | POA: Diagnosis not present

## 2017-11-01 DIAGNOSIS — G40909 Epilepsy, unspecified, not intractable, without status epilepticus: Secondary | ICD-10-CM | POA: Diagnosis not present

## 2017-11-01 DIAGNOSIS — I9589 Other hypotension: Secondary | ICD-10-CM | POA: Diagnosis not present

## 2017-11-02 ENCOUNTER — Encounter: Payer: Self-pay | Admitting: Internal Medicine

## 2017-11-02 DIAGNOSIS — G40109 Localization-related (focal) (partial) symptomatic epilepsy and epileptic syndromes with simple partial seizures, not intractable, without status epilepticus: Secondary | ICD-10-CM

## 2017-11-02 DIAGNOSIS — G40909 Epilepsy, unspecified, not intractable, without status epilepticus: Secondary | ICD-10-CM

## 2017-11-03 ENCOUNTER — Encounter (INDEPENDENT_AMBULATORY_CARE_PROVIDER_SITE_OTHER): Payer: Self-pay

## 2017-11-03 ENCOUNTER — Telehealth: Payer: Self-pay | Admitting: Neurology

## 2017-11-03 DIAGNOSIS — S22000S Wedge compression fracture of unspecified thoracic vertebra, sequela: Secondary | ICD-10-CM | POA: Diagnosis not present

## 2017-11-03 DIAGNOSIS — Z79891 Long term (current) use of opiate analgesic: Secondary | ICD-10-CM | POA: Diagnosis not present

## 2017-11-03 DIAGNOSIS — R1310 Dysphagia, unspecified: Secondary | ICD-10-CM | POA: Diagnosis not present

## 2017-11-03 DIAGNOSIS — S069X9S Unspecified intracranial injury with loss of consciousness of unspecified duration, sequela: Secondary | ICD-10-CM | POA: Diagnosis not present

## 2017-11-03 DIAGNOSIS — G40909 Epilepsy, unspecified, not intractable, without status epilepticus: Secondary | ICD-10-CM | POA: Diagnosis not present

## 2017-11-03 DIAGNOSIS — S32000S Wedge compression fracture of unspecified lumbar vertebra, sequela: Secondary | ICD-10-CM | POA: Diagnosis not present

## 2017-11-03 DIAGNOSIS — I2510 Atherosclerotic heart disease of native coronary artery without angina pectoris: Secondary | ICD-10-CM | POA: Diagnosis not present

## 2017-11-03 DIAGNOSIS — I9589 Other hypotension: Secondary | ICD-10-CM | POA: Diagnosis not present

## 2017-11-03 NOTE — Telephone Encounter (Signed)
Patients wife states pt has kept the medication the same. Medication limictal 150mg  twice a day and pushing fluids dramatically has since seemed to stop seizures. Patient wife states she tried to contact Dr.Aquino through [facility] but her name is not on there anymore. FYI

## 2017-11-03 NOTE — Telephone Encounter (Signed)
pls see mychart exchange yesterday with pt's wife and Aldine Contes, MD

## 2017-11-04 DIAGNOSIS — S069X9S Unspecified intracranial injury with loss of consciousness of unspecified duration, sequela: Secondary | ICD-10-CM | POA: Diagnosis not present

## 2017-11-04 NOTE — Telephone Encounter (Signed)
Spoke with Todd [name] this morning.  She will have pt's temporary PCP to send notes from her office as well as PT/OT notes to verify he is being seen. She has already spoken to [facility] and they are able to help her with getting reimbursed for the Bed rails and would still like that DME order placed if possible.  [name] insurance will not cover the restorator she has Requested therefore she no longer needs that equipment at this time.

## 2017-11-05 DIAGNOSIS — Z79891 Long term (current) use of opiate analgesic: Secondary | ICD-10-CM | POA: Diagnosis not present

## 2017-11-05 DIAGNOSIS — G40909 Epilepsy, unspecified, not intractable, without status epilepticus: Secondary | ICD-10-CM | POA: Diagnosis not present

## 2017-11-05 DIAGNOSIS — I2510 Atherosclerotic heart disease of native coronary artery without angina pectoris: Secondary | ICD-10-CM | POA: Diagnosis not present

## 2017-11-05 DIAGNOSIS — S22000S Wedge compression fracture of unspecified thoracic vertebra, sequela: Secondary | ICD-10-CM | POA: Diagnosis not present

## 2017-11-05 DIAGNOSIS — S069X9S Unspecified intracranial injury with loss of consciousness of unspecified duration, sequela: Secondary | ICD-10-CM | POA: Diagnosis not present

## 2017-11-05 DIAGNOSIS — I9589 Other hypotension: Secondary | ICD-10-CM | POA: Diagnosis not present

## 2017-11-05 DIAGNOSIS — R1310 Dysphagia, unspecified: Secondary | ICD-10-CM | POA: Diagnosis not present

## 2017-11-05 DIAGNOSIS — S32000S Wedge compression fracture of unspecified lumbar vertebra, sequela: Secondary | ICD-10-CM | POA: Diagnosis not present

## 2017-11-08 DIAGNOSIS — I2510 Atherosclerotic heart disease of native coronary artery without angina pectoris: Secondary | ICD-10-CM | POA: Diagnosis not present

## 2017-11-08 DIAGNOSIS — S069X9S Unspecified intracranial injury with loss of consciousness of unspecified duration, sequela: Secondary | ICD-10-CM | POA: Diagnosis not present

## 2017-11-08 DIAGNOSIS — I9589 Other hypotension: Secondary | ICD-10-CM | POA: Diagnosis not present

## 2017-11-08 DIAGNOSIS — S32000S Wedge compression fracture of unspecified lumbar vertebra, sequela: Secondary | ICD-10-CM | POA: Diagnosis not present

## 2017-11-08 DIAGNOSIS — G40909 Epilepsy, unspecified, not intractable, without status epilepticus: Secondary | ICD-10-CM | POA: Diagnosis not present

## 2017-11-08 DIAGNOSIS — R1310 Dysphagia, unspecified: Secondary | ICD-10-CM | POA: Diagnosis not present

## 2017-11-08 DIAGNOSIS — Z79891 Long term (current) use of opiate analgesic: Secondary | ICD-10-CM | POA: Diagnosis not present

## 2017-11-08 DIAGNOSIS — S22000S Wedge compression fracture of unspecified thoracic vertebra, sequela: Secondary | ICD-10-CM | POA: Diagnosis not present

## 2017-11-09 DIAGNOSIS — S22000S Wedge compression fracture of unspecified thoracic vertebra, sequela: Secondary | ICD-10-CM | POA: Diagnosis not present

## 2017-11-09 DIAGNOSIS — S069X9S Unspecified intracranial injury with loss of consciousness of unspecified duration, sequela: Secondary | ICD-10-CM | POA: Diagnosis not present

## 2017-11-09 DIAGNOSIS — S32000S Wedge compression fracture of unspecified lumbar vertebra, sequela: Secondary | ICD-10-CM | POA: Diagnosis not present

## 2017-11-09 DIAGNOSIS — R1310 Dysphagia, unspecified: Secondary | ICD-10-CM | POA: Diagnosis not present

## 2017-11-09 DIAGNOSIS — G40909 Epilepsy, unspecified, not intractable, without status epilepticus: Secondary | ICD-10-CM | POA: Diagnosis not present

## 2017-11-09 DIAGNOSIS — Z79891 Long term (current) use of opiate analgesic: Secondary | ICD-10-CM | POA: Diagnosis not present

## 2017-11-09 DIAGNOSIS — I2510 Atherosclerotic heart disease of native coronary artery without angina pectoris: Secondary | ICD-10-CM | POA: Diagnosis not present

## 2017-11-09 DIAGNOSIS — I9589 Other hypotension: Secondary | ICD-10-CM | POA: Diagnosis not present

## 2017-11-10 DIAGNOSIS — S32000S Wedge compression fracture of unspecified lumbar vertebra, sequela: Secondary | ICD-10-CM | POA: Diagnosis not present

## 2017-11-10 DIAGNOSIS — S22000S Wedge compression fracture of unspecified thoracic vertebra, sequela: Secondary | ICD-10-CM | POA: Diagnosis not present

## 2017-11-10 DIAGNOSIS — Z79891 Long term (current) use of opiate analgesic: Secondary | ICD-10-CM | POA: Diagnosis not present

## 2017-11-10 DIAGNOSIS — S069X9S Unspecified intracranial injury with loss of consciousness of unspecified duration, sequela: Secondary | ICD-10-CM | POA: Diagnosis not present

## 2017-11-10 DIAGNOSIS — I2510 Atherosclerotic heart disease of native coronary artery without angina pectoris: Secondary | ICD-10-CM | POA: Diagnosis not present

## 2017-11-10 DIAGNOSIS — R1310 Dysphagia, unspecified: Secondary | ICD-10-CM | POA: Diagnosis not present

## 2017-11-10 DIAGNOSIS — I9589 Other hypotension: Secondary | ICD-10-CM | POA: Diagnosis not present

## 2017-11-10 DIAGNOSIS — G40909 Epilepsy, unspecified, not intractable, without status epilepticus: Secondary | ICD-10-CM | POA: Diagnosis not present

## 2017-11-12 DIAGNOSIS — S22000S Wedge compression fracture of unspecified thoracic vertebra, sequela: Secondary | ICD-10-CM | POA: Diagnosis not present

## 2017-11-12 DIAGNOSIS — S069X9S Unspecified intracranial injury with loss of consciousness of unspecified duration, sequela: Secondary | ICD-10-CM | POA: Diagnosis not present

## 2017-11-12 DIAGNOSIS — I2510 Atherosclerotic heart disease of native coronary artery without angina pectoris: Secondary | ICD-10-CM | POA: Diagnosis not present

## 2017-11-12 DIAGNOSIS — I9589 Other hypotension: Secondary | ICD-10-CM | POA: Diagnosis not present

## 2017-11-12 DIAGNOSIS — R1310 Dysphagia, unspecified: Secondary | ICD-10-CM | POA: Diagnosis not present

## 2017-11-12 DIAGNOSIS — G40909 Epilepsy, unspecified, not intractable, without status epilepticus: Secondary | ICD-10-CM | POA: Diagnosis not present

## 2017-11-12 DIAGNOSIS — Z79891 Long term (current) use of opiate analgesic: Secondary | ICD-10-CM | POA: Diagnosis not present

## 2017-11-12 DIAGNOSIS — S32000S Wedge compression fracture of unspecified lumbar vertebra, sequela: Secondary | ICD-10-CM | POA: Diagnosis not present

## 2017-11-15 DIAGNOSIS — I2510 Atherosclerotic heart disease of native coronary artery without angina pectoris: Secondary | ICD-10-CM | POA: Diagnosis not present

## 2017-11-15 DIAGNOSIS — S069X9S Unspecified intracranial injury with loss of consciousness of unspecified duration, sequela: Secondary | ICD-10-CM | POA: Diagnosis not present

## 2017-11-15 DIAGNOSIS — Z79891 Long term (current) use of opiate analgesic: Secondary | ICD-10-CM | POA: Diagnosis not present

## 2017-11-15 DIAGNOSIS — R1310 Dysphagia, unspecified: Secondary | ICD-10-CM | POA: Diagnosis not present

## 2017-11-15 DIAGNOSIS — I9589 Other hypotension: Secondary | ICD-10-CM | POA: Diagnosis not present

## 2017-11-15 DIAGNOSIS — S22000S Wedge compression fracture of unspecified thoracic vertebra, sequela: Secondary | ICD-10-CM | POA: Diagnosis not present

## 2017-11-15 DIAGNOSIS — G40909 Epilepsy, unspecified, not intractable, without status epilepticus: Secondary | ICD-10-CM | POA: Diagnosis not present

## 2017-11-15 DIAGNOSIS — S32000S Wedge compression fracture of unspecified lumbar vertebra, sequela: Secondary | ICD-10-CM | POA: Diagnosis not present

## 2017-11-16 ENCOUNTER — Encounter: Payer: Self-pay | Admitting: Internal Medicine

## 2017-11-16 ENCOUNTER — Telehealth: Payer: Self-pay

## 2017-11-16 MED ORDER — LAMOTRIGINE 150 MG PO TABS: 150.0000 mg | ORAL_TABLET | Freq: Two times a day (BID) | ORAL | 1 refills | Status: DC

## 2017-11-16 NOTE — Telephone Encounter (Signed)
Spoke with patients wife and lamictal 150mg  sent to [facility] per Dr Delice Lesch.

## 2017-11-17 DIAGNOSIS — S069X9S Unspecified intracranial injury with loss of consciousness of unspecified duration, sequela: Secondary | ICD-10-CM | POA: Diagnosis not present

## 2017-11-17 DIAGNOSIS — I2510 Atherosclerotic heart disease of native coronary artery without angina pectoris: Secondary | ICD-10-CM | POA: Diagnosis not present

## 2017-11-17 DIAGNOSIS — I9589 Other hypotension: Secondary | ICD-10-CM | POA: Diagnosis not present

## 2017-11-17 DIAGNOSIS — S32000S Wedge compression fracture of unspecified lumbar vertebra, sequela: Secondary | ICD-10-CM | POA: Diagnosis not present

## 2017-11-17 DIAGNOSIS — G40909 Epilepsy, unspecified, not intractable, without status epilepticus: Secondary | ICD-10-CM | POA: Diagnosis not present

## 2017-11-17 DIAGNOSIS — R1310 Dysphagia, unspecified: Secondary | ICD-10-CM | POA: Diagnosis not present

## 2017-11-17 DIAGNOSIS — S22000S Wedge compression fracture of unspecified thoracic vertebra, sequela: Secondary | ICD-10-CM | POA: Diagnosis not present

## 2017-11-17 DIAGNOSIS — Z79891 Long term (current) use of opiate analgesic: Secondary | ICD-10-CM | POA: Diagnosis not present

## 2017-11-18 DIAGNOSIS — I9589 Other hypotension: Secondary | ICD-10-CM | POA: Diagnosis not present

## 2017-11-18 DIAGNOSIS — S22000S Wedge compression fracture of unspecified thoracic vertebra, sequela: Secondary | ICD-10-CM | POA: Diagnosis not present

## 2017-11-18 DIAGNOSIS — S069X9S Unspecified intracranial injury with loss of consciousness of unspecified duration, sequela: Secondary | ICD-10-CM | POA: Diagnosis not present

## 2017-11-18 DIAGNOSIS — S32000S Wedge compression fracture of unspecified lumbar vertebra, sequela: Secondary | ICD-10-CM | POA: Diagnosis not present

## 2017-11-18 DIAGNOSIS — I2510 Atherosclerotic heart disease of native coronary artery without angina pectoris: Secondary | ICD-10-CM | POA: Diagnosis not present

## 2017-11-18 DIAGNOSIS — G40909 Epilepsy, unspecified, not intractable, without status epilepticus: Secondary | ICD-10-CM | POA: Diagnosis not present

## 2017-11-18 DIAGNOSIS — R1310 Dysphagia, unspecified: Secondary | ICD-10-CM | POA: Diagnosis not present

## 2017-11-18 DIAGNOSIS — Z79891 Long term (current) use of opiate analgesic: Secondary | ICD-10-CM | POA: Diagnosis not present

## 2017-11-19 DIAGNOSIS — R1310 Dysphagia, unspecified: Secondary | ICD-10-CM | POA: Diagnosis not present

## 2017-11-19 DIAGNOSIS — I9589 Other hypotension: Secondary | ICD-10-CM | POA: Diagnosis not present

## 2017-11-19 DIAGNOSIS — I2510 Atherosclerotic heart disease of native coronary artery without angina pectoris: Secondary | ICD-10-CM | POA: Diagnosis not present

## 2017-11-19 DIAGNOSIS — S069X9S Unspecified intracranial injury with loss of consciousness of unspecified duration, sequela: Secondary | ICD-10-CM | POA: Diagnosis not present

## 2017-11-19 DIAGNOSIS — S32000S Wedge compression fracture of unspecified lumbar vertebra, sequela: Secondary | ICD-10-CM | POA: Diagnosis not present

## 2017-11-19 DIAGNOSIS — G40909 Epilepsy, unspecified, not intractable, without status epilepticus: Secondary | ICD-10-CM | POA: Diagnosis not present

## 2017-11-19 DIAGNOSIS — Z79891 Long term (current) use of opiate analgesic: Secondary | ICD-10-CM | POA: Diagnosis not present

## 2017-11-19 DIAGNOSIS — S22000S Wedge compression fracture of unspecified thoracic vertebra, sequela: Secondary | ICD-10-CM | POA: Diagnosis not present

## 2017-11-23 DIAGNOSIS — I9589 Other hypotension: Secondary | ICD-10-CM | POA: Diagnosis not present

## 2017-11-23 DIAGNOSIS — I2510 Atherosclerotic heart disease of native coronary artery without angina pectoris: Secondary | ICD-10-CM | POA: Diagnosis not present

## 2017-11-23 DIAGNOSIS — S22000S Wedge compression fracture of unspecified thoracic vertebra, sequela: Secondary | ICD-10-CM | POA: Diagnosis not present

## 2017-11-23 DIAGNOSIS — S069X9S Unspecified intracranial injury with loss of consciousness of unspecified duration, sequela: Secondary | ICD-10-CM | POA: Diagnosis not present

## 2017-11-23 DIAGNOSIS — R2681 Unsteadiness on feet: Secondary | ICD-10-CM | POA: Diagnosis not present

## 2017-11-23 DIAGNOSIS — G40909 Epilepsy, unspecified, not intractable, without status epilepticus: Secondary | ICD-10-CM | POA: Diagnosis not present

## 2017-11-23 DIAGNOSIS — S32000S Wedge compression fracture of unspecified lumbar vertebra, sequela: Secondary | ICD-10-CM | POA: Diagnosis not present

## 2017-11-24 DIAGNOSIS — I2510 Atherosclerotic heart disease of native coronary artery without angina pectoris: Secondary | ICD-10-CM | POA: Diagnosis not present

## 2017-11-24 DIAGNOSIS — S069X9S Unspecified intracranial injury with loss of consciousness of unspecified duration, sequela: Secondary | ICD-10-CM | POA: Diagnosis not present

## 2017-11-24 DIAGNOSIS — S32000S Wedge compression fracture of unspecified lumbar vertebra, sequela: Secondary | ICD-10-CM | POA: Diagnosis not present

## 2017-11-24 DIAGNOSIS — S22000S Wedge compression fracture of unspecified thoracic vertebra, sequela: Secondary | ICD-10-CM | POA: Diagnosis not present

## 2017-11-24 DIAGNOSIS — G40909 Epilepsy, unspecified, not intractable, without status epilepticus: Secondary | ICD-10-CM | POA: Diagnosis not present

## 2017-11-24 DIAGNOSIS — R2681 Unsteadiness on feet: Secondary | ICD-10-CM | POA: Diagnosis not present

## 2017-11-24 DIAGNOSIS — I9589 Other hypotension: Secondary | ICD-10-CM | POA: Diagnosis not present

## 2017-11-25 DIAGNOSIS — S22000S Wedge compression fracture of unspecified thoracic vertebra, sequela: Secondary | ICD-10-CM | POA: Diagnosis not present

## 2017-11-25 DIAGNOSIS — G40909 Epilepsy, unspecified, not intractable, without status epilepticus: Secondary | ICD-10-CM | POA: Diagnosis not present

## 2017-11-25 DIAGNOSIS — R2681 Unsteadiness on feet: Secondary | ICD-10-CM | POA: Diagnosis not present

## 2017-11-25 DIAGNOSIS — S069X9S Unspecified intracranial injury with loss of consciousness of unspecified duration, sequela: Secondary | ICD-10-CM | POA: Diagnosis not present

## 2017-11-25 DIAGNOSIS — I9589 Other hypotension: Secondary | ICD-10-CM | POA: Diagnosis not present

## 2017-11-25 DIAGNOSIS — I2510 Atherosclerotic heart disease of native coronary artery without angina pectoris: Secondary | ICD-10-CM | POA: Diagnosis not present

## 2017-11-25 DIAGNOSIS — S32000S Wedge compression fracture of unspecified lumbar vertebra, sequela: Secondary | ICD-10-CM | POA: Diagnosis not present

## 2017-11-26 DIAGNOSIS — G40909 Epilepsy, unspecified, not intractable, without status epilepticus: Secondary | ICD-10-CM | POA: Diagnosis not present

## 2017-11-26 DIAGNOSIS — I2510 Atherosclerotic heart disease of native coronary artery without angina pectoris: Secondary | ICD-10-CM | POA: Diagnosis not present

## 2017-11-26 DIAGNOSIS — S069X9S Unspecified intracranial injury with loss of consciousness of unspecified duration, sequela: Secondary | ICD-10-CM | POA: Diagnosis not present

## 2017-11-26 DIAGNOSIS — I9589 Other hypotension: Secondary | ICD-10-CM | POA: Diagnosis not present

## 2017-11-26 DIAGNOSIS — S32000S Wedge compression fracture of unspecified lumbar vertebra, sequela: Secondary | ICD-10-CM | POA: Diagnosis not present

## 2017-11-26 DIAGNOSIS — R2681 Unsteadiness on feet: Secondary | ICD-10-CM | POA: Diagnosis not present

## 2017-11-26 DIAGNOSIS — S22000S Wedge compression fracture of unspecified thoracic vertebra, sequela: Secondary | ICD-10-CM | POA: Diagnosis not present

## 2017-11-29 DIAGNOSIS — S069X9S Unspecified intracranial injury with loss of consciousness of unspecified duration, sequela: Secondary | ICD-10-CM | POA: Diagnosis not present

## 2017-11-29 DIAGNOSIS — I2510 Atherosclerotic heart disease of native coronary artery without angina pectoris: Secondary | ICD-10-CM | POA: Diagnosis not present

## 2017-11-29 DIAGNOSIS — S22000S Wedge compression fracture of unspecified thoracic vertebra, sequela: Secondary | ICD-10-CM | POA: Diagnosis not present

## 2017-11-29 DIAGNOSIS — S32000S Wedge compression fracture of unspecified lumbar vertebra, sequela: Secondary | ICD-10-CM | POA: Diagnosis not present

## 2017-11-29 DIAGNOSIS — R2681 Unsteadiness on feet: Secondary | ICD-10-CM | POA: Diagnosis not present

## 2017-11-29 DIAGNOSIS — G40909 Epilepsy, unspecified, not intractable, without status epilepticus: Secondary | ICD-10-CM | POA: Diagnosis not present

## 2017-11-29 DIAGNOSIS — I9589 Other hypotension: Secondary | ICD-10-CM | POA: Diagnosis not present

## 2017-12-01 DIAGNOSIS — S22000S Wedge compression fracture of unspecified thoracic vertebra, sequela: Secondary | ICD-10-CM | POA: Diagnosis not present

## 2017-12-01 DIAGNOSIS — S32000S Wedge compression fracture of unspecified lumbar vertebra, sequela: Secondary | ICD-10-CM | POA: Diagnosis not present

## 2017-12-01 DIAGNOSIS — R2681 Unsteadiness on feet: Secondary | ICD-10-CM | POA: Diagnosis not present

## 2017-12-01 DIAGNOSIS — I9589 Other hypotension: Secondary | ICD-10-CM | POA: Diagnosis not present

## 2017-12-01 DIAGNOSIS — S069X9S Unspecified intracranial injury with loss of consciousness of unspecified duration, sequela: Secondary | ICD-10-CM | POA: Diagnosis not present

## 2017-12-01 DIAGNOSIS — I2510 Atherosclerotic heart disease of native coronary artery without angina pectoris: Secondary | ICD-10-CM | POA: Diagnosis not present

## 2017-12-01 DIAGNOSIS — G40909 Epilepsy, unspecified, not intractable, without status epilepticus: Secondary | ICD-10-CM | POA: Diagnosis not present

## 2017-12-03 DIAGNOSIS — G40909 Epilepsy, unspecified, not intractable, without status epilepticus: Secondary | ICD-10-CM | POA: Diagnosis not present

## 2017-12-03 DIAGNOSIS — I9589 Other hypotension: Secondary | ICD-10-CM | POA: Diagnosis not present

## 2017-12-03 DIAGNOSIS — S069X9S Unspecified intracranial injury with loss of consciousness of unspecified duration, sequela: Secondary | ICD-10-CM | POA: Diagnosis not present

## 2017-12-03 DIAGNOSIS — S22000S Wedge compression fracture of unspecified thoracic vertebra, sequela: Secondary | ICD-10-CM | POA: Diagnosis not present

## 2017-12-03 DIAGNOSIS — R2681 Unsteadiness on feet: Secondary | ICD-10-CM | POA: Diagnosis not present

## 2017-12-03 DIAGNOSIS — I2510 Atherosclerotic heart disease of native coronary artery without angina pectoris: Secondary | ICD-10-CM | POA: Diagnosis not present

## 2017-12-03 DIAGNOSIS — S32000S Wedge compression fracture of unspecified lumbar vertebra, sequela: Secondary | ICD-10-CM | POA: Diagnosis not present

## 2017-12-06 DIAGNOSIS — R2681 Unsteadiness on feet: Secondary | ICD-10-CM | POA: Diagnosis not present

## 2017-12-06 DIAGNOSIS — G40909 Epilepsy, unspecified, not intractable, without status epilepticus: Secondary | ICD-10-CM | POA: Diagnosis not present

## 2017-12-06 DIAGNOSIS — I9589 Other hypotension: Secondary | ICD-10-CM | POA: Diagnosis not present

## 2017-12-06 DIAGNOSIS — S32000S Wedge compression fracture of unspecified lumbar vertebra, sequela: Secondary | ICD-10-CM | POA: Diagnosis not present

## 2017-12-06 DIAGNOSIS — S069X9S Unspecified intracranial injury with loss of consciousness of unspecified duration, sequela: Secondary | ICD-10-CM | POA: Diagnosis not present

## 2017-12-06 DIAGNOSIS — I2510 Atherosclerotic heart disease of native coronary artery without angina pectoris: Secondary | ICD-10-CM | POA: Diagnosis not present

## 2017-12-06 DIAGNOSIS — S22000S Wedge compression fracture of unspecified thoracic vertebra, sequela: Secondary | ICD-10-CM | POA: Diagnosis not present

## 2017-12-07 DIAGNOSIS — S22000S Wedge compression fracture of unspecified thoracic vertebra, sequela: Secondary | ICD-10-CM | POA: Diagnosis not present

## 2017-12-07 DIAGNOSIS — R2681 Unsteadiness on feet: Secondary | ICD-10-CM | POA: Diagnosis not present

## 2017-12-07 DIAGNOSIS — S32000S Wedge compression fracture of unspecified lumbar vertebra, sequela: Secondary | ICD-10-CM | POA: Diagnosis not present

## 2017-12-07 DIAGNOSIS — I9589 Other hypotension: Secondary | ICD-10-CM | POA: Diagnosis not present

## 2017-12-07 DIAGNOSIS — G40909 Epilepsy, unspecified, not intractable, without status epilepticus: Secondary | ICD-10-CM | POA: Diagnosis not present

## 2017-12-07 DIAGNOSIS — S069X9S Unspecified intracranial injury with loss of consciousness of unspecified duration, sequela: Secondary | ICD-10-CM | POA: Diagnosis not present

## 2017-12-07 DIAGNOSIS — I2510 Atherosclerotic heart disease of native coronary artery without angina pectoris: Secondary | ICD-10-CM | POA: Diagnosis not present

## 2017-12-08 DIAGNOSIS — S22000S Wedge compression fracture of unspecified thoracic vertebra, sequela: Secondary | ICD-10-CM | POA: Diagnosis not present

## 2017-12-08 DIAGNOSIS — I2510 Atherosclerotic heart disease of native coronary artery without angina pectoris: Secondary | ICD-10-CM | POA: Diagnosis not present

## 2017-12-08 DIAGNOSIS — R2681 Unsteadiness on feet: Secondary | ICD-10-CM | POA: Diagnosis not present

## 2017-12-08 DIAGNOSIS — S069X9S Unspecified intracranial injury with loss of consciousness of unspecified duration, sequela: Secondary | ICD-10-CM | POA: Diagnosis not present

## 2017-12-08 DIAGNOSIS — G40909 Epilepsy, unspecified, not intractable, without status epilepticus: Secondary | ICD-10-CM | POA: Diagnosis not present

## 2017-12-08 DIAGNOSIS — S32000S Wedge compression fracture of unspecified lumbar vertebra, sequela: Secondary | ICD-10-CM | POA: Diagnosis not present

## 2017-12-08 DIAGNOSIS — I9589 Other hypotension: Secondary | ICD-10-CM | POA: Diagnosis not present

## 2017-12-09 DIAGNOSIS — I9589 Other hypotension: Secondary | ICD-10-CM | POA: Diagnosis not present

## 2017-12-09 DIAGNOSIS — R2681 Unsteadiness on feet: Secondary | ICD-10-CM | POA: Diagnosis not present

## 2017-12-09 DIAGNOSIS — S32000S Wedge compression fracture of unspecified lumbar vertebra, sequela: Secondary | ICD-10-CM | POA: Diagnosis not present

## 2017-12-09 DIAGNOSIS — G40909 Epilepsy, unspecified, not intractable, without status epilepticus: Secondary | ICD-10-CM | POA: Diagnosis not present

## 2017-12-09 DIAGNOSIS — S22000S Wedge compression fracture of unspecified thoracic vertebra, sequela: Secondary | ICD-10-CM | POA: Diagnosis not present

## 2017-12-09 DIAGNOSIS — S069X9S Unspecified intracranial injury with loss of consciousness of unspecified duration, sequela: Secondary | ICD-10-CM | POA: Diagnosis not present

## 2017-12-09 DIAGNOSIS — I2510 Atherosclerotic heart disease of native coronary artery without angina pectoris: Secondary | ICD-10-CM | POA: Diagnosis not present

## 2017-12-10 DIAGNOSIS — G40909 Epilepsy, unspecified, not intractable, without status epilepticus: Secondary | ICD-10-CM | POA: Diagnosis not present

## 2017-12-10 DIAGNOSIS — I2510 Atherosclerotic heart disease of native coronary artery without angina pectoris: Secondary | ICD-10-CM | POA: Diagnosis not present

## 2017-12-10 DIAGNOSIS — Z87891 Personal history of nicotine dependence: Secondary | ICD-10-CM | POA: Diagnosis not present

## 2017-12-10 DIAGNOSIS — Z23 Encounter for immunization: Secondary | ICD-10-CM | POA: Diagnosis not present

## 2017-12-10 DIAGNOSIS — H9193 Unspecified hearing loss, bilateral: Secondary | ICD-10-CM | POA: Diagnosis not present

## 2017-12-10 DIAGNOSIS — R2681 Unsteadiness on feet: Secondary | ICD-10-CM | POA: Diagnosis not present

## 2017-12-10 DIAGNOSIS — S32000S Wedge compression fracture of unspecified lumbar vertebra, sequela: Secondary | ICD-10-CM | POA: Diagnosis not present

## 2017-12-10 DIAGNOSIS — E7800 Pure hypercholesterolemia, unspecified: Secondary | ICD-10-CM | POA: Diagnosis not present

## 2017-12-10 DIAGNOSIS — S22000S Wedge compression fracture of unspecified thoracic vertebra, sequela: Secondary | ICD-10-CM | POA: Diagnosis not present

## 2017-12-10 DIAGNOSIS — S069X9S Unspecified intracranial injury with loss of consciousness of unspecified duration, sequela: Secondary | ICD-10-CM | POA: Diagnosis not present

## 2017-12-10 DIAGNOSIS — I9589 Other hypotension: Secondary | ICD-10-CM | POA: Diagnosis not present

## 2017-12-13 DIAGNOSIS — S22000S Wedge compression fracture of unspecified thoracic vertebra, sequela: Secondary | ICD-10-CM | POA: Diagnosis not present

## 2017-12-13 DIAGNOSIS — S32000S Wedge compression fracture of unspecified lumbar vertebra, sequela: Secondary | ICD-10-CM | POA: Diagnosis not present

## 2017-12-13 DIAGNOSIS — I9589 Other hypotension: Secondary | ICD-10-CM | POA: Diagnosis not present

## 2017-12-13 DIAGNOSIS — R2681 Unsteadiness on feet: Secondary | ICD-10-CM | POA: Diagnosis not present

## 2017-12-13 DIAGNOSIS — G40909 Epilepsy, unspecified, not intractable, without status epilepticus: Secondary | ICD-10-CM | POA: Diagnosis not present

## 2017-12-13 DIAGNOSIS — S069X9S Unspecified intracranial injury with loss of consciousness of unspecified duration, sequela: Secondary | ICD-10-CM | POA: Diagnosis not present

## 2017-12-13 DIAGNOSIS — I2510 Atherosclerotic heart disease of native coronary artery without angina pectoris: Secondary | ICD-10-CM | POA: Diagnosis not present

## 2017-12-14 DIAGNOSIS — R2681 Unsteadiness on feet: Secondary | ICD-10-CM | POA: Diagnosis not present

## 2017-12-14 DIAGNOSIS — G40909 Epilepsy, unspecified, not intractable, without status epilepticus: Secondary | ICD-10-CM | POA: Diagnosis not present

## 2017-12-14 DIAGNOSIS — S22000S Wedge compression fracture of unspecified thoracic vertebra, sequela: Secondary | ICD-10-CM | POA: Diagnosis not present

## 2017-12-14 DIAGNOSIS — S069X9S Unspecified intracranial injury with loss of consciousness of unspecified duration, sequela: Secondary | ICD-10-CM | POA: Diagnosis not present

## 2017-12-14 DIAGNOSIS — I2510 Atherosclerotic heart disease of native coronary artery without angina pectoris: Secondary | ICD-10-CM | POA: Diagnosis not present

## 2017-12-14 DIAGNOSIS — E7800 Pure hypercholesterolemia, unspecified: Secondary | ICD-10-CM | POA: Diagnosis not present

## 2017-12-14 DIAGNOSIS — S32000S Wedge compression fracture of unspecified lumbar vertebra, sequela: Secondary | ICD-10-CM | POA: Diagnosis not present

## 2017-12-14 DIAGNOSIS — I9589 Other hypotension: Secondary | ICD-10-CM | POA: Diagnosis not present

## 2017-12-14 DIAGNOSIS — Z125 Encounter for screening for malignant neoplasm of prostate: Secondary | ICD-10-CM | POA: Diagnosis not present

## 2017-12-15 DIAGNOSIS — G40909 Epilepsy, unspecified, not intractable, without status epilepticus: Secondary | ICD-10-CM | POA: Diagnosis not present

## 2017-12-15 DIAGNOSIS — I9589 Other hypotension: Secondary | ICD-10-CM | POA: Diagnosis not present

## 2017-12-15 DIAGNOSIS — S32000S Wedge compression fracture of unspecified lumbar vertebra, sequela: Secondary | ICD-10-CM | POA: Diagnosis not present

## 2017-12-15 DIAGNOSIS — R2681 Unsteadiness on feet: Secondary | ICD-10-CM | POA: Diagnosis not present

## 2017-12-15 DIAGNOSIS — S22000S Wedge compression fracture of unspecified thoracic vertebra, sequela: Secondary | ICD-10-CM | POA: Diagnosis not present

## 2017-12-15 DIAGNOSIS — I2510 Atherosclerotic heart disease of native coronary artery without angina pectoris: Secondary | ICD-10-CM | POA: Diagnosis not present

## 2017-12-15 DIAGNOSIS — S069X9S Unspecified intracranial injury with loss of consciousness of unspecified duration, sequela: Secondary | ICD-10-CM | POA: Diagnosis not present

## 2017-12-16 DIAGNOSIS — S32000S Wedge compression fracture of unspecified lumbar vertebra, sequela: Secondary | ICD-10-CM | POA: Diagnosis not present

## 2017-12-16 DIAGNOSIS — I2510 Atherosclerotic heart disease of native coronary artery without angina pectoris: Secondary | ICD-10-CM | POA: Diagnosis not present

## 2017-12-16 DIAGNOSIS — S22000S Wedge compression fracture of unspecified thoracic vertebra, sequela: Secondary | ICD-10-CM | POA: Diagnosis not present

## 2017-12-16 DIAGNOSIS — R2681 Unsteadiness on feet: Secondary | ICD-10-CM | POA: Diagnosis not present

## 2017-12-16 DIAGNOSIS — I9589 Other hypotension: Secondary | ICD-10-CM | POA: Diagnosis not present

## 2017-12-16 DIAGNOSIS — S069X9S Unspecified intracranial injury with loss of consciousness of unspecified duration, sequela: Secondary | ICD-10-CM | POA: Diagnosis not present

## 2017-12-16 DIAGNOSIS — G40909 Epilepsy, unspecified, not intractable, without status epilepticus: Secondary | ICD-10-CM | POA: Diagnosis not present

## 2017-12-17 DIAGNOSIS — I9589 Other hypotension: Secondary | ICD-10-CM | POA: Diagnosis not present

## 2017-12-17 DIAGNOSIS — G40909 Epilepsy, unspecified, not intractable, without status epilepticus: Secondary | ICD-10-CM | POA: Diagnosis not present

## 2017-12-17 DIAGNOSIS — S22000S Wedge compression fracture of unspecified thoracic vertebra, sequela: Secondary | ICD-10-CM | POA: Diagnosis not present

## 2017-12-17 DIAGNOSIS — I2510 Atherosclerotic heart disease of native coronary artery without angina pectoris: Secondary | ICD-10-CM | POA: Diagnosis not present

## 2017-12-17 DIAGNOSIS — R2681 Unsteadiness on feet: Secondary | ICD-10-CM | POA: Diagnosis not present

## 2017-12-17 DIAGNOSIS — S32000S Wedge compression fracture of unspecified lumbar vertebra, sequela: Secondary | ICD-10-CM | POA: Diagnosis not present

## 2017-12-17 DIAGNOSIS — S069X9S Unspecified intracranial injury with loss of consciousness of unspecified duration, sequela: Secondary | ICD-10-CM | POA: Diagnosis not present

## 2017-12-20 DIAGNOSIS — G40909 Epilepsy, unspecified, not intractable, without status epilepticus: Secondary | ICD-10-CM | POA: Diagnosis not present

## 2017-12-20 DIAGNOSIS — R2681 Unsteadiness on feet: Secondary | ICD-10-CM | POA: Diagnosis not present

## 2017-12-20 DIAGNOSIS — I2510 Atherosclerotic heart disease of native coronary artery without angina pectoris: Secondary | ICD-10-CM | POA: Diagnosis not present

## 2017-12-20 DIAGNOSIS — S069X9S Unspecified intracranial injury with loss of consciousness of unspecified duration, sequela: Secondary | ICD-10-CM | POA: Diagnosis not present

## 2017-12-20 DIAGNOSIS — S32000S Wedge compression fracture of unspecified lumbar vertebra, sequela: Secondary | ICD-10-CM | POA: Diagnosis not present

## 2017-12-20 DIAGNOSIS — I9589 Other hypotension: Secondary | ICD-10-CM | POA: Diagnosis not present

## 2017-12-20 DIAGNOSIS — S22000S Wedge compression fracture of unspecified thoracic vertebra, sequela: Secondary | ICD-10-CM | POA: Diagnosis not present

## 2017-12-21 DIAGNOSIS — S069X9S Unspecified intracranial injury with loss of consciousness of unspecified duration, sequela: Secondary | ICD-10-CM | POA: Diagnosis not present

## 2017-12-21 DIAGNOSIS — S32000S Wedge compression fracture of unspecified lumbar vertebra, sequela: Secondary | ICD-10-CM | POA: Diagnosis not present

## 2017-12-21 DIAGNOSIS — I2510 Atherosclerotic heart disease of native coronary artery without angina pectoris: Secondary | ICD-10-CM | POA: Diagnosis not present

## 2017-12-21 DIAGNOSIS — R2681 Unsteadiness on feet: Secondary | ICD-10-CM | POA: Diagnosis not present

## 2017-12-21 DIAGNOSIS — I9589 Other hypotension: Secondary | ICD-10-CM | POA: Diagnosis not present

## 2017-12-21 DIAGNOSIS — S22000S Wedge compression fracture of unspecified thoracic vertebra, sequela: Secondary | ICD-10-CM | POA: Diagnosis not present

## 2017-12-21 DIAGNOSIS — G40909 Epilepsy, unspecified, not intractable, without status epilepticus: Secondary | ICD-10-CM | POA: Diagnosis not present

## 2017-12-22 DIAGNOSIS — I2510 Atherosclerotic heart disease of native coronary artery without angina pectoris: Secondary | ICD-10-CM | POA: Diagnosis not present

## 2017-12-22 DIAGNOSIS — S22000S Wedge compression fracture of unspecified thoracic vertebra, sequela: Secondary | ICD-10-CM | POA: Diagnosis not present

## 2017-12-22 DIAGNOSIS — S32000S Wedge compression fracture of unspecified lumbar vertebra, sequela: Secondary | ICD-10-CM | POA: Diagnosis not present

## 2017-12-22 DIAGNOSIS — S069X9S Unspecified intracranial injury with loss of consciousness of unspecified duration, sequela: Secondary | ICD-10-CM | POA: Diagnosis not present

## 2017-12-22 DIAGNOSIS — R2681 Unsteadiness on feet: Secondary | ICD-10-CM | POA: Diagnosis not present

## 2017-12-22 DIAGNOSIS — I9589 Other hypotension: Secondary | ICD-10-CM | POA: Diagnosis not present

## 2017-12-22 DIAGNOSIS — G40909 Epilepsy, unspecified, not intractable, without status epilepticus: Secondary | ICD-10-CM | POA: Diagnosis not present

## 2017-12-23 DIAGNOSIS — S069X9S Unspecified intracranial injury with loss of consciousness of unspecified duration, sequela: Secondary | ICD-10-CM | POA: Diagnosis not present

## 2017-12-23 DIAGNOSIS — R2681 Unsteadiness on feet: Secondary | ICD-10-CM | POA: Diagnosis not present

## 2017-12-23 DIAGNOSIS — S32000S Wedge compression fracture of unspecified lumbar vertebra, sequela: Secondary | ICD-10-CM | POA: Diagnosis not present

## 2017-12-23 DIAGNOSIS — G40909 Epilepsy, unspecified, not intractable, without status epilepticus: Secondary | ICD-10-CM | POA: Diagnosis not present

## 2017-12-23 DIAGNOSIS — S22000S Wedge compression fracture of unspecified thoracic vertebra, sequela: Secondary | ICD-10-CM | POA: Diagnosis not present

## 2017-12-23 DIAGNOSIS — I2510 Atherosclerotic heart disease of native coronary artery without angina pectoris: Secondary | ICD-10-CM | POA: Diagnosis not present

## 2017-12-23 DIAGNOSIS — I9589 Other hypotension: Secondary | ICD-10-CM | POA: Diagnosis not present

## 2017-12-27 DIAGNOSIS — I9589 Other hypotension: Secondary | ICD-10-CM | POA: Diagnosis not present

## 2017-12-27 DIAGNOSIS — S32000S Wedge compression fracture of unspecified lumbar vertebra, sequela: Secondary | ICD-10-CM | POA: Diagnosis not present

## 2017-12-27 DIAGNOSIS — S22000S Wedge compression fracture of unspecified thoracic vertebra, sequela: Secondary | ICD-10-CM | POA: Diagnosis not present

## 2017-12-27 DIAGNOSIS — I2510 Atherosclerotic heart disease of native coronary artery without angina pectoris: Secondary | ICD-10-CM | POA: Diagnosis not present

## 2017-12-27 DIAGNOSIS — S069X9S Unspecified intracranial injury with loss of consciousness of unspecified duration, sequela: Secondary | ICD-10-CM | POA: Diagnosis not present

## 2017-12-27 DIAGNOSIS — G40909 Epilepsy, unspecified, not intractable, without status epilepticus: Secondary | ICD-10-CM | POA: Diagnosis not present

## 2017-12-27 DIAGNOSIS — R2681 Unsteadiness on feet: Secondary | ICD-10-CM | POA: Diagnosis not present

## 2017-12-29 DIAGNOSIS — I2510 Atherosclerotic heart disease of native coronary artery without angina pectoris: Secondary | ICD-10-CM | POA: Diagnosis not present

## 2017-12-29 DIAGNOSIS — S22000S Wedge compression fracture of unspecified thoracic vertebra, sequela: Secondary | ICD-10-CM | POA: Diagnosis not present

## 2017-12-29 DIAGNOSIS — R2681 Unsteadiness on feet: Secondary | ICD-10-CM | POA: Diagnosis not present

## 2017-12-29 DIAGNOSIS — G40909 Epilepsy, unspecified, not intractable, without status epilepticus: Secondary | ICD-10-CM | POA: Diagnosis not present

## 2017-12-29 DIAGNOSIS — S32000S Wedge compression fracture of unspecified lumbar vertebra, sequela: Secondary | ICD-10-CM | POA: Diagnosis not present

## 2017-12-29 DIAGNOSIS — S069X9S Unspecified intracranial injury with loss of consciousness of unspecified duration, sequela: Secondary | ICD-10-CM | POA: Diagnosis not present

## 2017-12-29 DIAGNOSIS — I9589 Other hypotension: Secondary | ICD-10-CM | POA: Diagnosis not present

## 2017-12-30 DIAGNOSIS — S069X9S Unspecified intracranial injury with loss of consciousness of unspecified duration, sequela: Secondary | ICD-10-CM | POA: Diagnosis not present

## 2017-12-30 DIAGNOSIS — G40909 Epilepsy, unspecified, not intractable, without status epilepticus: Secondary | ICD-10-CM | POA: Diagnosis not present

## 2017-12-30 DIAGNOSIS — R2681 Unsteadiness on feet: Secondary | ICD-10-CM | POA: Diagnosis not present

## 2017-12-30 DIAGNOSIS — S22000S Wedge compression fracture of unspecified thoracic vertebra, sequela: Secondary | ICD-10-CM | POA: Diagnosis not present

## 2017-12-30 DIAGNOSIS — I2510 Atherosclerotic heart disease of native coronary artery without angina pectoris: Secondary | ICD-10-CM | POA: Diagnosis not present

## 2017-12-30 DIAGNOSIS — I9589 Other hypotension: Secondary | ICD-10-CM | POA: Diagnosis not present

## 2017-12-30 DIAGNOSIS — S32000S Wedge compression fracture of unspecified lumbar vertebra, sequela: Secondary | ICD-10-CM | POA: Diagnosis not present

## 2017-12-31 DIAGNOSIS — S069X9S Unspecified intracranial injury with loss of consciousness of unspecified duration, sequela: Secondary | ICD-10-CM | POA: Diagnosis not present

## 2017-12-31 DIAGNOSIS — I9589 Other hypotension: Secondary | ICD-10-CM | POA: Diagnosis not present

## 2017-12-31 DIAGNOSIS — I2510 Atherosclerotic heart disease of native coronary artery without angina pectoris: Secondary | ICD-10-CM | POA: Diagnosis not present

## 2017-12-31 DIAGNOSIS — R2681 Unsteadiness on feet: Secondary | ICD-10-CM | POA: Diagnosis not present

## 2017-12-31 DIAGNOSIS — S22000S Wedge compression fracture of unspecified thoracic vertebra, sequela: Secondary | ICD-10-CM | POA: Diagnosis not present

## 2017-12-31 DIAGNOSIS — G40909 Epilepsy, unspecified, not intractable, without status epilepticus: Secondary | ICD-10-CM | POA: Diagnosis not present

## 2017-12-31 DIAGNOSIS — S32000S Wedge compression fracture of unspecified lumbar vertebra, sequela: Secondary | ICD-10-CM | POA: Diagnosis not present

## 2018-01-03 DIAGNOSIS — S22000S Wedge compression fracture of unspecified thoracic vertebra, sequela: Secondary | ICD-10-CM | POA: Diagnosis not present

## 2018-01-03 DIAGNOSIS — I2510 Atherosclerotic heart disease of native coronary artery without angina pectoris: Secondary | ICD-10-CM | POA: Diagnosis not present

## 2018-01-03 DIAGNOSIS — G40909 Epilepsy, unspecified, not intractable, without status epilepticus: Secondary | ICD-10-CM | POA: Diagnosis not present

## 2018-01-03 DIAGNOSIS — I9589 Other hypotension: Secondary | ICD-10-CM | POA: Diagnosis not present

## 2018-01-03 DIAGNOSIS — S32000S Wedge compression fracture of unspecified lumbar vertebra, sequela: Secondary | ICD-10-CM | POA: Diagnosis not present

## 2018-01-03 DIAGNOSIS — S069X9S Unspecified intracranial injury with loss of consciousness of unspecified duration, sequela: Secondary | ICD-10-CM | POA: Diagnosis not present

## 2018-01-03 DIAGNOSIS — R2681 Unsteadiness on feet: Secondary | ICD-10-CM | POA: Diagnosis not present

## 2018-01-05 DIAGNOSIS — G40909 Epilepsy, unspecified, not intractable, without status epilepticus: Secondary | ICD-10-CM | POA: Diagnosis not present

## 2018-01-05 DIAGNOSIS — S069X9S Unspecified intracranial injury with loss of consciousness of unspecified duration, sequela: Secondary | ICD-10-CM | POA: Diagnosis not present

## 2018-01-05 DIAGNOSIS — S32000S Wedge compression fracture of unspecified lumbar vertebra, sequela: Secondary | ICD-10-CM | POA: Diagnosis not present

## 2018-01-05 DIAGNOSIS — I9589 Other hypotension: Secondary | ICD-10-CM | POA: Diagnosis not present

## 2018-01-05 DIAGNOSIS — I2510 Atherosclerotic heart disease of native coronary artery without angina pectoris: Secondary | ICD-10-CM | POA: Diagnosis not present

## 2018-01-05 DIAGNOSIS — S22000S Wedge compression fracture of unspecified thoracic vertebra, sequela: Secondary | ICD-10-CM | POA: Diagnosis not present

## 2018-01-05 DIAGNOSIS — R2681 Unsteadiness on feet: Secondary | ICD-10-CM | POA: Diagnosis not present

## 2018-01-07 DIAGNOSIS — G40909 Epilepsy, unspecified, not intractable, without status epilepticus: Secondary | ICD-10-CM | POA: Diagnosis not present

## 2018-01-07 DIAGNOSIS — I9589 Other hypotension: Secondary | ICD-10-CM | POA: Diagnosis not present

## 2018-01-07 DIAGNOSIS — S32000S Wedge compression fracture of unspecified lumbar vertebra, sequela: Secondary | ICD-10-CM | POA: Diagnosis not present

## 2018-01-07 DIAGNOSIS — R2681 Unsteadiness on feet: Secondary | ICD-10-CM | POA: Diagnosis not present

## 2018-01-07 DIAGNOSIS — S22000S Wedge compression fracture of unspecified thoracic vertebra, sequela: Secondary | ICD-10-CM | POA: Diagnosis not present

## 2018-01-07 DIAGNOSIS — S069X9S Unspecified intracranial injury with loss of consciousness of unspecified duration, sequela: Secondary | ICD-10-CM | POA: Diagnosis not present

## 2018-01-07 DIAGNOSIS — I2510 Atherosclerotic heart disease of native coronary artery without angina pectoris: Secondary | ICD-10-CM | POA: Diagnosis not present

## 2018-01-08 DIAGNOSIS — I9589 Other hypotension: Secondary | ICD-10-CM | POA: Diagnosis not present

## 2018-01-08 DIAGNOSIS — R2681 Unsteadiness on feet: Secondary | ICD-10-CM | POA: Diagnosis not present

## 2018-01-08 DIAGNOSIS — S22000S Wedge compression fracture of unspecified thoracic vertebra, sequela: Secondary | ICD-10-CM | POA: Diagnosis not present

## 2018-01-08 DIAGNOSIS — G40909 Epilepsy, unspecified, not intractable, without status epilepticus: Secondary | ICD-10-CM | POA: Diagnosis not present

## 2018-01-08 DIAGNOSIS — S32000S Wedge compression fracture of unspecified lumbar vertebra, sequela: Secondary | ICD-10-CM | POA: Diagnosis not present

## 2018-01-08 DIAGNOSIS — S069X9S Unspecified intracranial injury with loss of consciousness of unspecified duration, sequela: Secondary | ICD-10-CM | POA: Diagnosis not present

## 2018-01-08 DIAGNOSIS — I2510 Atherosclerotic heart disease of native coronary artery without angina pectoris: Secondary | ICD-10-CM | POA: Diagnosis not present

## 2018-01-10 DIAGNOSIS — S32000S Wedge compression fracture of unspecified lumbar vertebra, sequela: Secondary | ICD-10-CM | POA: Diagnosis not present

## 2018-01-10 DIAGNOSIS — R2681 Unsteadiness on feet: Secondary | ICD-10-CM | POA: Diagnosis not present

## 2018-01-10 DIAGNOSIS — S069X9S Unspecified intracranial injury with loss of consciousness of unspecified duration, sequela: Secondary | ICD-10-CM | POA: Diagnosis not present

## 2018-01-10 DIAGNOSIS — I2510 Atherosclerotic heart disease of native coronary artery without angina pectoris: Secondary | ICD-10-CM | POA: Diagnosis not present

## 2018-01-10 DIAGNOSIS — S22000S Wedge compression fracture of unspecified thoracic vertebra, sequela: Secondary | ICD-10-CM | POA: Diagnosis not present

## 2018-01-10 DIAGNOSIS — G40909 Epilepsy, unspecified, not intractable, without status epilepticus: Secondary | ICD-10-CM | POA: Diagnosis not present

## 2018-01-10 DIAGNOSIS — I9589 Other hypotension: Secondary | ICD-10-CM | POA: Diagnosis not present

## 2018-01-11 DIAGNOSIS — G40909 Epilepsy, unspecified, not intractable, without status epilepticus: Secondary | ICD-10-CM | POA: Diagnosis not present

## 2018-01-11 DIAGNOSIS — S32000S Wedge compression fracture of unspecified lumbar vertebra, sequela: Secondary | ICD-10-CM | POA: Diagnosis not present

## 2018-01-11 DIAGNOSIS — S069X9S Unspecified intracranial injury with loss of consciousness of unspecified duration, sequela: Secondary | ICD-10-CM | POA: Diagnosis not present

## 2018-01-11 DIAGNOSIS — I9589 Other hypotension: Secondary | ICD-10-CM | POA: Diagnosis not present

## 2018-01-11 DIAGNOSIS — R2681 Unsteadiness on feet: Secondary | ICD-10-CM | POA: Diagnosis not present

## 2018-01-11 DIAGNOSIS — S22000S Wedge compression fracture of unspecified thoracic vertebra, sequela: Secondary | ICD-10-CM | POA: Diagnosis not present

## 2018-01-11 DIAGNOSIS — I2510 Atherosclerotic heart disease of native coronary artery without angina pectoris: Secondary | ICD-10-CM | POA: Diagnosis not present

## 2018-01-12 DIAGNOSIS — R2681 Unsteadiness on feet: Secondary | ICD-10-CM | POA: Diagnosis not present

## 2018-01-12 DIAGNOSIS — I2510 Atherosclerotic heart disease of native coronary artery without angina pectoris: Secondary | ICD-10-CM | POA: Diagnosis not present

## 2018-01-12 DIAGNOSIS — S32000S Wedge compression fracture of unspecified lumbar vertebra, sequela: Secondary | ICD-10-CM | POA: Diagnosis not present

## 2018-01-12 DIAGNOSIS — S22000S Wedge compression fracture of unspecified thoracic vertebra, sequela: Secondary | ICD-10-CM | POA: Diagnosis not present

## 2018-01-12 DIAGNOSIS — I9589 Other hypotension: Secondary | ICD-10-CM | POA: Diagnosis not present

## 2018-01-12 DIAGNOSIS — G40909 Epilepsy, unspecified, not intractable, without status epilepticus: Secondary | ICD-10-CM | POA: Diagnosis not present

## 2018-01-12 DIAGNOSIS — S069X9S Unspecified intracranial injury with loss of consciousness of unspecified duration, sequela: Secondary | ICD-10-CM | POA: Diagnosis not present

## 2018-01-13 ENCOUNTER — Telehealth: Payer: Self-pay | Admitting: Neurology

## 2018-01-13 NOTE — Telephone Encounter (Signed)
[name] wife is calling in wanting to speak with Meagen about some medical things going on with her husband. She didn't want to go into detail, just needs a call back. Please call her at [phone]. Thanks!

## 2018-01-13 NOTE — Telephone Encounter (Signed)
Spoke with pt's wife.  She states "I really need to talk with Santiago Glad."  I let her know that Dr. Delice Lesch is out of the office this afternoon.  Pt's wife states that she would like a call back from Dr. Delice Lesch so that she does not have to repeat herself.

## 2018-01-17 DIAGNOSIS — I2510 Atherosclerotic heart disease of native coronary artery without angina pectoris: Secondary | ICD-10-CM | POA: Diagnosis not present

## 2018-01-17 DIAGNOSIS — S22000S Wedge compression fracture of unspecified thoracic vertebra, sequela: Secondary | ICD-10-CM | POA: Diagnosis not present

## 2018-01-17 DIAGNOSIS — R2681 Unsteadiness on feet: Secondary | ICD-10-CM | POA: Diagnosis not present

## 2018-01-17 DIAGNOSIS — I9589 Other hypotension: Secondary | ICD-10-CM | POA: Diagnosis not present

## 2018-01-17 DIAGNOSIS — G40909 Epilepsy, unspecified, not intractable, without status epilepticus: Secondary | ICD-10-CM | POA: Diagnosis not present

## 2018-01-17 DIAGNOSIS — S069X9S Unspecified intracranial injury with loss of consciousness of unspecified duration, sequela: Secondary | ICD-10-CM | POA: Diagnosis not present

## 2018-01-17 DIAGNOSIS — S32000S Wedge compression fracture of unspecified lumbar vertebra, sequela: Secondary | ICD-10-CM | POA: Diagnosis not present

## 2018-01-17 NOTE — Telephone Encounter (Signed)
Patient wife is calling back because she would really like to speak to Dr Delice Lesch about her husband please call

## 2018-01-17 NOTE — Telephone Encounter (Signed)
Spoke to wife. Speech pathologist wanted her to call me because he is sleeping him all the time. She adjusted meds and meals, and still falling asleep. Wondering about sleep apnea. Only takes oxycodone 4x/month. Discussed that if no other sedating meds, home sleep study would be helpful. She will discuss with his PCP in [facility].

## 2018-01-18 DIAGNOSIS — R2681 Unsteadiness on feet: Secondary | ICD-10-CM | POA: Diagnosis not present

## 2018-01-18 DIAGNOSIS — I9589 Other hypotension: Secondary | ICD-10-CM | POA: Diagnosis not present

## 2018-01-18 DIAGNOSIS — G40909 Epilepsy, unspecified, not intractable, without status epilepticus: Secondary | ICD-10-CM | POA: Diagnosis not present

## 2018-01-18 DIAGNOSIS — S069X9S Unspecified intracranial injury with loss of consciousness of unspecified duration, sequela: Secondary | ICD-10-CM | POA: Diagnosis not present

## 2018-01-18 DIAGNOSIS — S32000S Wedge compression fracture of unspecified lumbar vertebra, sequela: Secondary | ICD-10-CM | POA: Diagnosis not present

## 2018-01-18 DIAGNOSIS — I2510 Atherosclerotic heart disease of native coronary artery without angina pectoris: Secondary | ICD-10-CM | POA: Diagnosis not present

## 2018-01-18 DIAGNOSIS — S22000S Wedge compression fracture of unspecified thoracic vertebra, sequela: Secondary | ICD-10-CM | POA: Diagnosis not present

## 2018-01-19 DIAGNOSIS — I9589 Other hypotension: Secondary | ICD-10-CM | POA: Diagnosis not present

## 2018-01-19 DIAGNOSIS — I2510 Atherosclerotic heart disease of native coronary artery without angina pectoris: Secondary | ICD-10-CM | POA: Diagnosis not present

## 2018-01-19 DIAGNOSIS — R2681 Unsteadiness on feet: Secondary | ICD-10-CM | POA: Diagnosis not present

## 2018-01-19 DIAGNOSIS — G40909 Epilepsy, unspecified, not intractable, without status epilepticus: Secondary | ICD-10-CM | POA: Diagnosis not present

## 2018-01-19 DIAGNOSIS — S069X9S Unspecified intracranial injury with loss of consciousness of unspecified duration, sequela: Secondary | ICD-10-CM | POA: Diagnosis not present

## 2018-01-19 DIAGNOSIS — S32000S Wedge compression fracture of unspecified lumbar vertebra, sequela: Secondary | ICD-10-CM | POA: Diagnosis not present

## 2018-01-19 DIAGNOSIS — S22000S Wedge compression fracture of unspecified thoracic vertebra, sequela: Secondary | ICD-10-CM | POA: Diagnosis not present

## 2018-01-21 DIAGNOSIS — S22000S Wedge compression fracture of unspecified thoracic vertebra, sequela: Secondary | ICD-10-CM | POA: Diagnosis not present

## 2018-01-21 DIAGNOSIS — R1310 Dysphagia, unspecified: Secondary | ICD-10-CM | POA: Diagnosis not present

## 2018-01-21 DIAGNOSIS — S069X9S Unspecified intracranial injury with loss of consciousness of unspecified duration, sequela: Secondary | ICD-10-CM | POA: Diagnosis not present

## 2018-01-21 DIAGNOSIS — S32000S Wedge compression fracture of unspecified lumbar vertebra, sequela: Secondary | ICD-10-CM | POA: Diagnosis not present

## 2018-01-21 DIAGNOSIS — I9589 Other hypotension: Secondary | ICD-10-CM | POA: Diagnosis not present

## 2018-01-21 DIAGNOSIS — I2510 Atherosclerotic heart disease of native coronary artery without angina pectoris: Secondary | ICD-10-CM | POA: Diagnosis not present

## 2018-01-21 DIAGNOSIS — G40909 Epilepsy, unspecified, not intractable, without status epilepticus: Secondary | ICD-10-CM | POA: Diagnosis not present

## 2018-01-24 DIAGNOSIS — I9589 Other hypotension: Secondary | ICD-10-CM | POA: Diagnosis not present

## 2018-01-24 DIAGNOSIS — G40909 Epilepsy, unspecified, not intractable, without status epilepticus: Secondary | ICD-10-CM | POA: Diagnosis not present

## 2018-01-24 DIAGNOSIS — R1310 Dysphagia, unspecified: Secondary | ICD-10-CM | POA: Diagnosis not present

## 2018-01-24 DIAGNOSIS — I2510 Atherosclerotic heart disease of native coronary artery without angina pectoris: Secondary | ICD-10-CM | POA: Diagnosis not present

## 2018-01-24 DIAGNOSIS — S069X9S Unspecified intracranial injury with loss of consciousness of unspecified duration, sequela: Secondary | ICD-10-CM | POA: Diagnosis not present

## 2018-01-24 DIAGNOSIS — S32000S Wedge compression fracture of unspecified lumbar vertebra, sequela: Secondary | ICD-10-CM | POA: Diagnosis not present

## 2018-01-24 DIAGNOSIS — S22000S Wedge compression fracture of unspecified thoracic vertebra, sequela: Secondary | ICD-10-CM | POA: Diagnosis not present

## 2018-01-26 DIAGNOSIS — R1310 Dysphagia, unspecified: Secondary | ICD-10-CM | POA: Diagnosis not present

## 2018-01-26 DIAGNOSIS — G40909 Epilepsy, unspecified, not intractable, without status epilepticus: Secondary | ICD-10-CM | POA: Diagnosis not present

## 2018-01-26 DIAGNOSIS — S069X9S Unspecified intracranial injury with loss of consciousness of unspecified duration, sequela: Secondary | ICD-10-CM | POA: Diagnosis not present

## 2018-01-26 DIAGNOSIS — S22000S Wedge compression fracture of unspecified thoracic vertebra, sequela: Secondary | ICD-10-CM | POA: Diagnosis not present

## 2018-01-26 DIAGNOSIS — S32000S Wedge compression fracture of unspecified lumbar vertebra, sequela: Secondary | ICD-10-CM | POA: Diagnosis not present

## 2018-01-26 DIAGNOSIS — I9589 Other hypotension: Secondary | ICD-10-CM | POA: Diagnosis not present

## 2018-01-26 DIAGNOSIS — I2510 Atherosclerotic heart disease of native coronary artery without angina pectoris: Secondary | ICD-10-CM | POA: Diagnosis not present

## 2018-01-28 DIAGNOSIS — S32000S Wedge compression fracture of unspecified lumbar vertebra, sequela: Secondary | ICD-10-CM | POA: Diagnosis not present

## 2018-01-28 DIAGNOSIS — I9589 Other hypotension: Secondary | ICD-10-CM | POA: Diagnosis not present

## 2018-01-28 DIAGNOSIS — S22000S Wedge compression fracture of unspecified thoracic vertebra, sequela: Secondary | ICD-10-CM | POA: Diagnosis not present

## 2018-01-28 DIAGNOSIS — R1310 Dysphagia, unspecified: Secondary | ICD-10-CM | POA: Diagnosis not present

## 2018-01-28 DIAGNOSIS — G40909 Epilepsy, unspecified, not intractable, without status epilepticus: Secondary | ICD-10-CM | POA: Diagnosis not present

## 2018-01-28 DIAGNOSIS — S069X9S Unspecified intracranial injury with loss of consciousness of unspecified duration, sequela: Secondary | ICD-10-CM | POA: Diagnosis not present

## 2018-01-28 DIAGNOSIS — I2510 Atherosclerotic heart disease of native coronary artery without angina pectoris: Secondary | ICD-10-CM | POA: Diagnosis not present

## 2018-01-31 DIAGNOSIS — I2510 Atherosclerotic heart disease of native coronary artery without angina pectoris: Secondary | ICD-10-CM | POA: Diagnosis not present

## 2018-01-31 DIAGNOSIS — S32000S Wedge compression fracture of unspecified lumbar vertebra, sequela: Secondary | ICD-10-CM | POA: Diagnosis not present

## 2018-01-31 DIAGNOSIS — S069X9S Unspecified intracranial injury with loss of consciousness of unspecified duration, sequela: Secondary | ICD-10-CM | POA: Diagnosis not present

## 2018-01-31 DIAGNOSIS — S22000S Wedge compression fracture of unspecified thoracic vertebra, sequela: Secondary | ICD-10-CM | POA: Diagnosis not present

## 2018-01-31 DIAGNOSIS — I9589 Other hypotension: Secondary | ICD-10-CM | POA: Diagnosis not present

## 2018-01-31 DIAGNOSIS — G40909 Epilepsy, unspecified, not intractable, without status epilepticus: Secondary | ICD-10-CM | POA: Diagnosis not present

## 2018-01-31 DIAGNOSIS — R1310 Dysphagia, unspecified: Secondary | ICD-10-CM | POA: Diagnosis not present

## 2018-02-01 DIAGNOSIS — G40909 Epilepsy, unspecified, not intractable, without status epilepticus: Secondary | ICD-10-CM | POA: Diagnosis not present

## 2018-02-01 DIAGNOSIS — S069X9S Unspecified intracranial injury with loss of consciousness of unspecified duration, sequela: Secondary | ICD-10-CM | POA: Diagnosis not present

## 2018-02-01 DIAGNOSIS — I2510 Atherosclerotic heart disease of native coronary artery without angina pectoris: Secondary | ICD-10-CM | POA: Diagnosis not present

## 2018-02-01 DIAGNOSIS — I9589 Other hypotension: Secondary | ICD-10-CM | POA: Diagnosis not present

## 2018-02-01 DIAGNOSIS — R1310 Dysphagia, unspecified: Secondary | ICD-10-CM | POA: Diagnosis not present

## 2018-02-01 DIAGNOSIS — S32000S Wedge compression fracture of unspecified lumbar vertebra, sequela: Secondary | ICD-10-CM | POA: Diagnosis not present

## 2018-02-01 DIAGNOSIS — S22000S Wedge compression fracture of unspecified thoracic vertebra, sequela: Secondary | ICD-10-CM | POA: Diagnosis not present

## 2018-02-03 DIAGNOSIS — S22000S Wedge compression fracture of unspecified thoracic vertebra, sequela: Secondary | ICD-10-CM | POA: Diagnosis not present

## 2018-02-03 DIAGNOSIS — S32000S Wedge compression fracture of unspecified lumbar vertebra, sequela: Secondary | ICD-10-CM | POA: Diagnosis not present

## 2018-02-03 DIAGNOSIS — G40909 Epilepsy, unspecified, not intractable, without status epilepticus: Secondary | ICD-10-CM | POA: Diagnosis not present

## 2018-02-03 DIAGNOSIS — I2510 Atherosclerotic heart disease of native coronary artery without angina pectoris: Secondary | ICD-10-CM | POA: Diagnosis not present

## 2018-02-03 DIAGNOSIS — R1310 Dysphagia, unspecified: Secondary | ICD-10-CM | POA: Diagnosis not present

## 2018-02-03 DIAGNOSIS — I9589 Other hypotension: Secondary | ICD-10-CM | POA: Diagnosis not present

## 2018-02-03 DIAGNOSIS — S069X9S Unspecified intracranial injury with loss of consciousness of unspecified duration, sequela: Secondary | ICD-10-CM | POA: Diagnosis not present

## 2018-02-04 DIAGNOSIS — S22000S Wedge compression fracture of unspecified thoracic vertebra, sequela: Secondary | ICD-10-CM | POA: Diagnosis not present

## 2018-02-04 DIAGNOSIS — S32000S Wedge compression fracture of unspecified lumbar vertebra, sequela: Secondary | ICD-10-CM | POA: Diagnosis not present

## 2018-02-04 DIAGNOSIS — R1310 Dysphagia, unspecified: Secondary | ICD-10-CM | POA: Diagnosis not present

## 2018-02-04 DIAGNOSIS — I2510 Atherosclerotic heart disease of native coronary artery without angina pectoris: Secondary | ICD-10-CM | POA: Diagnosis not present

## 2018-02-04 DIAGNOSIS — I9589 Other hypotension: Secondary | ICD-10-CM | POA: Diagnosis not present

## 2018-02-04 DIAGNOSIS — G40909 Epilepsy, unspecified, not intractable, without status epilepticus: Secondary | ICD-10-CM | POA: Diagnosis not present

## 2018-02-04 DIAGNOSIS — S069X9S Unspecified intracranial injury with loss of consciousness of unspecified duration, sequela: Secondary | ICD-10-CM | POA: Diagnosis not present

## 2018-02-07 DIAGNOSIS — R1310 Dysphagia, unspecified: Secondary | ICD-10-CM | POA: Diagnosis not present

## 2018-02-07 DIAGNOSIS — S32000S Wedge compression fracture of unspecified lumbar vertebra, sequela: Secondary | ICD-10-CM | POA: Diagnosis not present

## 2018-02-07 DIAGNOSIS — S069X9S Unspecified intracranial injury with loss of consciousness of unspecified duration, sequela: Secondary | ICD-10-CM | POA: Diagnosis not present

## 2018-02-07 DIAGNOSIS — I9589 Other hypotension: Secondary | ICD-10-CM | POA: Diagnosis not present

## 2018-02-07 DIAGNOSIS — S22000S Wedge compression fracture of unspecified thoracic vertebra, sequela: Secondary | ICD-10-CM | POA: Diagnosis not present

## 2018-02-07 DIAGNOSIS — I2510 Atherosclerotic heart disease of native coronary artery without angina pectoris: Secondary | ICD-10-CM | POA: Diagnosis not present

## 2018-02-07 DIAGNOSIS — G40909 Epilepsy, unspecified, not intractable, without status epilepticus: Secondary | ICD-10-CM | POA: Diagnosis not present

## 2018-02-09 DIAGNOSIS — I2510 Atherosclerotic heart disease of native coronary artery without angina pectoris: Secondary | ICD-10-CM | POA: Diagnosis not present

## 2018-02-09 DIAGNOSIS — G40909 Epilepsy, unspecified, not intractable, without status epilepticus: Secondary | ICD-10-CM | POA: Diagnosis not present

## 2018-02-09 DIAGNOSIS — R1310 Dysphagia, unspecified: Secondary | ICD-10-CM | POA: Diagnosis not present

## 2018-02-09 DIAGNOSIS — S22000S Wedge compression fracture of unspecified thoracic vertebra, sequela: Secondary | ICD-10-CM | POA: Diagnosis not present

## 2018-02-09 DIAGNOSIS — S069X9S Unspecified intracranial injury with loss of consciousness of unspecified duration, sequela: Secondary | ICD-10-CM | POA: Diagnosis not present

## 2018-02-09 DIAGNOSIS — I9589 Other hypotension: Secondary | ICD-10-CM | POA: Diagnosis not present

## 2018-02-09 DIAGNOSIS — S32000S Wedge compression fracture of unspecified lumbar vertebra, sequela: Secondary | ICD-10-CM | POA: Diagnosis not present

## 2018-02-11 DIAGNOSIS — G40909 Epilepsy, unspecified, not intractable, without status epilepticus: Secondary | ICD-10-CM | POA: Diagnosis not present

## 2018-02-11 DIAGNOSIS — S069X9S Unspecified intracranial injury with loss of consciousness of unspecified duration, sequela: Secondary | ICD-10-CM | POA: Diagnosis not present

## 2018-02-11 DIAGNOSIS — I9589 Other hypotension: Secondary | ICD-10-CM | POA: Diagnosis not present

## 2018-02-11 DIAGNOSIS — I2510 Atherosclerotic heart disease of native coronary artery without angina pectoris: Secondary | ICD-10-CM | POA: Diagnosis not present

## 2018-02-11 DIAGNOSIS — S32000S Wedge compression fracture of unspecified lumbar vertebra, sequela: Secondary | ICD-10-CM | POA: Diagnosis not present

## 2018-02-11 DIAGNOSIS — S22000S Wedge compression fracture of unspecified thoracic vertebra, sequela: Secondary | ICD-10-CM | POA: Diagnosis not present

## 2018-02-11 DIAGNOSIS — R1310 Dysphagia, unspecified: Secondary | ICD-10-CM | POA: Diagnosis not present

## 2018-02-14 DIAGNOSIS — G40909 Epilepsy, unspecified, not intractable, without status epilepticus: Secondary | ICD-10-CM | POA: Diagnosis not present

## 2018-02-14 DIAGNOSIS — I9589 Other hypotension: Secondary | ICD-10-CM | POA: Diagnosis not present

## 2018-02-14 DIAGNOSIS — R1310 Dysphagia, unspecified: Secondary | ICD-10-CM | POA: Diagnosis not present

## 2018-02-14 DIAGNOSIS — S32000S Wedge compression fracture of unspecified lumbar vertebra, sequela: Secondary | ICD-10-CM | POA: Diagnosis not present

## 2018-02-14 DIAGNOSIS — I2510 Atherosclerotic heart disease of native coronary artery without angina pectoris: Secondary | ICD-10-CM | POA: Diagnosis not present

## 2018-02-14 DIAGNOSIS — S22000S Wedge compression fracture of unspecified thoracic vertebra, sequela: Secondary | ICD-10-CM | POA: Diagnosis not present

## 2018-02-14 DIAGNOSIS — S069X9S Unspecified intracranial injury with loss of consciousness of unspecified duration, sequela: Secondary | ICD-10-CM | POA: Diagnosis not present

## 2018-02-15 DIAGNOSIS — I9589 Other hypotension: Secondary | ICD-10-CM | POA: Diagnosis not present

## 2018-02-15 DIAGNOSIS — S069X9S Unspecified intracranial injury with loss of consciousness of unspecified duration, sequela: Secondary | ICD-10-CM | POA: Diagnosis not present

## 2018-02-15 DIAGNOSIS — G40909 Epilepsy, unspecified, not intractable, without status epilepticus: Secondary | ICD-10-CM | POA: Diagnosis not present

## 2018-02-15 DIAGNOSIS — R1310 Dysphagia, unspecified: Secondary | ICD-10-CM | POA: Diagnosis not present

## 2018-02-15 DIAGNOSIS — S32000S Wedge compression fracture of unspecified lumbar vertebra, sequela: Secondary | ICD-10-CM | POA: Diagnosis not present

## 2018-02-15 DIAGNOSIS — S22000S Wedge compression fracture of unspecified thoracic vertebra, sequela: Secondary | ICD-10-CM | POA: Diagnosis not present

## 2018-02-15 DIAGNOSIS — I2510 Atherosclerotic heart disease of native coronary artery without angina pectoris: Secondary | ICD-10-CM | POA: Diagnosis not present

## 2018-02-17 DIAGNOSIS — G40909 Epilepsy, unspecified, not intractable, without status epilepticus: Secondary | ICD-10-CM | POA: Diagnosis not present

## 2018-02-17 DIAGNOSIS — I9589 Other hypotension: Secondary | ICD-10-CM | POA: Diagnosis not present

## 2018-02-17 DIAGNOSIS — I2510 Atherosclerotic heart disease of native coronary artery without angina pectoris: Secondary | ICD-10-CM | POA: Diagnosis not present

## 2018-02-17 DIAGNOSIS — S069X9S Unspecified intracranial injury with loss of consciousness of unspecified duration, sequela: Secondary | ICD-10-CM | POA: Diagnosis not present

## 2018-02-17 DIAGNOSIS — S32000S Wedge compression fracture of unspecified lumbar vertebra, sequela: Secondary | ICD-10-CM | POA: Diagnosis not present

## 2018-02-17 DIAGNOSIS — R1310 Dysphagia, unspecified: Secondary | ICD-10-CM | POA: Diagnosis not present

## 2018-02-17 DIAGNOSIS — S22000S Wedge compression fracture of unspecified thoracic vertebra, sequela: Secondary | ICD-10-CM | POA: Diagnosis not present

## 2018-02-18 DIAGNOSIS — I9589 Other hypotension: Secondary | ICD-10-CM | POA: Diagnosis not present

## 2018-02-18 DIAGNOSIS — S069X9S Unspecified intracranial injury with loss of consciousness of unspecified duration, sequela: Secondary | ICD-10-CM | POA: Diagnosis not present

## 2018-02-18 DIAGNOSIS — S32000S Wedge compression fracture of unspecified lumbar vertebra, sequela: Secondary | ICD-10-CM | POA: Diagnosis not present

## 2018-02-18 DIAGNOSIS — R1310 Dysphagia, unspecified: Secondary | ICD-10-CM | POA: Diagnosis not present

## 2018-02-18 DIAGNOSIS — S22000S Wedge compression fracture of unspecified thoracic vertebra, sequela: Secondary | ICD-10-CM | POA: Diagnosis not present

## 2018-02-18 DIAGNOSIS — G40909 Epilepsy, unspecified, not intractable, without status epilepticus: Secondary | ICD-10-CM | POA: Diagnosis not present

## 2018-02-18 DIAGNOSIS — I2510 Atherosclerotic heart disease of native coronary artery without angina pectoris: Secondary | ICD-10-CM | POA: Diagnosis not present

## 2018-02-21 DIAGNOSIS — G40909 Epilepsy, unspecified, not intractable, without status epilepticus: Secondary | ICD-10-CM | POA: Diagnosis not present

## 2018-02-21 DIAGNOSIS — I2510 Atherosclerotic heart disease of native coronary artery without angina pectoris: Secondary | ICD-10-CM | POA: Diagnosis not present

## 2018-02-21 DIAGNOSIS — I9589 Other hypotension: Secondary | ICD-10-CM | POA: Diagnosis not present

## 2018-02-21 DIAGNOSIS — S32000S Wedge compression fracture of unspecified lumbar vertebra, sequela: Secondary | ICD-10-CM | POA: Diagnosis not present

## 2018-02-21 DIAGNOSIS — S069X9S Unspecified intracranial injury with loss of consciousness of unspecified duration, sequela: Secondary | ICD-10-CM | POA: Diagnosis not present

## 2018-02-21 DIAGNOSIS — R1310 Dysphagia, unspecified: Secondary | ICD-10-CM | POA: Diagnosis not present

## 2018-02-21 DIAGNOSIS — S22000S Wedge compression fracture of unspecified thoracic vertebra, sequela: Secondary | ICD-10-CM | POA: Diagnosis not present

## 2018-02-22 DIAGNOSIS — S069X9S Unspecified intracranial injury with loss of consciousness of unspecified duration, sequela: Secondary | ICD-10-CM | POA: Diagnosis not present

## 2018-02-22 DIAGNOSIS — I2510 Atherosclerotic heart disease of native coronary artery without angina pectoris: Secondary | ICD-10-CM | POA: Diagnosis not present

## 2018-02-22 DIAGNOSIS — S32000S Wedge compression fracture of unspecified lumbar vertebra, sequela: Secondary | ICD-10-CM | POA: Diagnosis not present

## 2018-02-22 DIAGNOSIS — R1310 Dysphagia, unspecified: Secondary | ICD-10-CM | POA: Diagnosis not present

## 2018-02-22 DIAGNOSIS — I9589 Other hypotension: Secondary | ICD-10-CM | POA: Diagnosis not present

## 2018-02-22 DIAGNOSIS — S22000S Wedge compression fracture of unspecified thoracic vertebra, sequela: Secondary | ICD-10-CM | POA: Diagnosis not present

## 2018-02-22 DIAGNOSIS — G40909 Epilepsy, unspecified, not intractable, without status epilepticus: Secondary | ICD-10-CM | POA: Diagnosis not present

## 2018-02-23 ENCOUNTER — Telehealth: Payer: Self-pay | Admitting: *Deleted

## 2018-02-23 DIAGNOSIS — R1310 Dysphagia, unspecified: Secondary | ICD-10-CM | POA: Diagnosis not present

## 2018-02-23 DIAGNOSIS — I9589 Other hypotension: Secondary | ICD-10-CM | POA: Diagnosis not present

## 2018-02-23 DIAGNOSIS — S32000S Wedge compression fracture of unspecified lumbar vertebra, sequela: Secondary | ICD-10-CM | POA: Diagnosis not present

## 2018-02-23 DIAGNOSIS — S069X9S Unspecified intracranial injury with loss of consciousness of unspecified duration, sequela: Secondary | ICD-10-CM | POA: Diagnosis not present

## 2018-02-23 DIAGNOSIS — S22000S Wedge compression fracture of unspecified thoracic vertebra, sequela: Secondary | ICD-10-CM | POA: Diagnosis not present

## 2018-02-23 DIAGNOSIS — I2510 Atherosclerotic heart disease of native coronary artery without angina pectoris: Secondary | ICD-10-CM | POA: Diagnosis not present

## 2018-02-23 DIAGNOSIS — G40909 Epilepsy, unspecified, not intractable, without status epilepticus: Secondary | ICD-10-CM | POA: Diagnosis not present

## 2018-02-23 NOTE — Telephone Encounter (Signed)
Thank you :)

## 2018-02-23 NOTE — Telephone Encounter (Signed)
From: [name]  Sent: 01/27/2018 11:03 AM EDT  To: Imp Triage Nurse Pool  Subject: RE: October Internal [place]     Todd [name] is getting his flu shot on October 9th. He has his second pneumonia booster last month. He is still having PT OT and Speech and really doing great!!! We miss everyone and hope Dr. Dareen Piano and Bonnita Nasuti and all the ladies are doing well.   Thank you for all of your help.  Todd [name]

## 2018-02-25 DIAGNOSIS — S22000S Wedge compression fracture of unspecified thoracic vertebra, sequela: Secondary | ICD-10-CM | POA: Diagnosis not present

## 2018-02-25 DIAGNOSIS — S32000S Wedge compression fracture of unspecified lumbar vertebra, sequela: Secondary | ICD-10-CM | POA: Diagnosis not present

## 2018-02-25 DIAGNOSIS — I2510 Atherosclerotic heart disease of native coronary artery without angina pectoris: Secondary | ICD-10-CM | POA: Diagnosis not present

## 2018-02-25 DIAGNOSIS — G40909 Epilepsy, unspecified, not intractable, without status epilepticus: Secondary | ICD-10-CM | POA: Diagnosis not present

## 2018-02-25 DIAGNOSIS — R1310 Dysphagia, unspecified: Secondary | ICD-10-CM | POA: Diagnosis not present

## 2018-02-25 DIAGNOSIS — I9589 Other hypotension: Secondary | ICD-10-CM | POA: Diagnosis not present

## 2018-02-25 DIAGNOSIS — S069X9S Unspecified intracranial injury with loss of consciousness of unspecified duration, sequela: Secondary | ICD-10-CM | POA: Diagnosis not present

## 2018-02-28 DIAGNOSIS — S069X9S Unspecified intracranial injury with loss of consciousness of unspecified duration, sequela: Secondary | ICD-10-CM | POA: Diagnosis not present

## 2018-02-28 DIAGNOSIS — R1310 Dysphagia, unspecified: Secondary | ICD-10-CM | POA: Diagnosis not present

## 2018-02-28 DIAGNOSIS — S22000S Wedge compression fracture of unspecified thoracic vertebra, sequela: Secondary | ICD-10-CM | POA: Diagnosis not present

## 2018-02-28 DIAGNOSIS — I2510 Atherosclerotic heart disease of native coronary artery without angina pectoris: Secondary | ICD-10-CM | POA: Diagnosis not present

## 2018-02-28 DIAGNOSIS — G40909 Epilepsy, unspecified, not intractable, without status epilepticus: Secondary | ICD-10-CM | POA: Diagnosis not present

## 2018-02-28 DIAGNOSIS — I9589 Other hypotension: Secondary | ICD-10-CM | POA: Diagnosis not present

## 2018-02-28 DIAGNOSIS — S32000S Wedge compression fracture of unspecified lumbar vertebra, sequela: Secondary | ICD-10-CM | POA: Diagnosis not present

## 2018-03-01 DIAGNOSIS — S22000S Wedge compression fracture of unspecified thoracic vertebra, sequela: Secondary | ICD-10-CM | POA: Diagnosis not present

## 2018-03-01 DIAGNOSIS — S32000S Wedge compression fracture of unspecified lumbar vertebra, sequela: Secondary | ICD-10-CM | POA: Diagnosis not present

## 2018-03-01 DIAGNOSIS — G40909 Epilepsy, unspecified, not intractable, without status epilepticus: Secondary | ICD-10-CM | POA: Diagnosis not present

## 2018-03-01 DIAGNOSIS — R1310 Dysphagia, unspecified: Secondary | ICD-10-CM | POA: Diagnosis not present

## 2018-03-01 DIAGNOSIS — S069X9S Unspecified intracranial injury with loss of consciousness of unspecified duration, sequela: Secondary | ICD-10-CM | POA: Diagnosis not present

## 2018-03-01 DIAGNOSIS — I9589 Other hypotension: Secondary | ICD-10-CM | POA: Diagnosis not present

## 2018-03-01 DIAGNOSIS — I2510 Atherosclerotic heart disease of native coronary artery without angina pectoris: Secondary | ICD-10-CM | POA: Diagnosis not present

## 2018-03-02 DIAGNOSIS — I9589 Other hypotension: Secondary | ICD-10-CM | POA: Diagnosis not present

## 2018-03-02 DIAGNOSIS — S32000S Wedge compression fracture of unspecified lumbar vertebra, sequela: Secondary | ICD-10-CM | POA: Diagnosis not present

## 2018-03-02 DIAGNOSIS — R1310 Dysphagia, unspecified: Secondary | ICD-10-CM | POA: Diagnosis not present

## 2018-03-02 DIAGNOSIS — S069X9S Unspecified intracranial injury with loss of consciousness of unspecified duration, sequela: Secondary | ICD-10-CM | POA: Diagnosis not present

## 2018-03-02 DIAGNOSIS — I2510 Atherosclerotic heart disease of native coronary artery without angina pectoris: Secondary | ICD-10-CM | POA: Diagnosis not present

## 2018-03-02 DIAGNOSIS — G40909 Epilepsy, unspecified, not intractable, without status epilepticus: Secondary | ICD-10-CM | POA: Diagnosis not present

## 2018-03-02 DIAGNOSIS — S22000S Wedge compression fracture of unspecified thoracic vertebra, sequela: Secondary | ICD-10-CM | POA: Diagnosis not present

## 2018-03-07 DIAGNOSIS — G40909 Epilepsy, unspecified, not intractable, without status epilepticus: Secondary | ICD-10-CM | POA: Diagnosis not present

## 2018-03-07 DIAGNOSIS — S32000S Wedge compression fracture of unspecified lumbar vertebra, sequela: Secondary | ICD-10-CM | POA: Diagnosis not present

## 2018-03-07 DIAGNOSIS — R1310 Dysphagia, unspecified: Secondary | ICD-10-CM | POA: Diagnosis not present

## 2018-03-07 DIAGNOSIS — I2510 Atherosclerotic heart disease of native coronary artery without angina pectoris: Secondary | ICD-10-CM | POA: Diagnosis not present

## 2018-03-07 DIAGNOSIS — I9589 Other hypotension: Secondary | ICD-10-CM | POA: Diagnosis not present

## 2018-03-07 DIAGNOSIS — S22000S Wedge compression fracture of unspecified thoracic vertebra, sequela: Secondary | ICD-10-CM | POA: Diagnosis not present

## 2018-03-07 DIAGNOSIS — S069X9S Unspecified intracranial injury with loss of consciousness of unspecified duration, sequela: Secondary | ICD-10-CM | POA: Diagnosis not present

## 2018-03-09 DIAGNOSIS — S069X9S Unspecified intracranial injury with loss of consciousness of unspecified duration, sequela: Secondary | ICD-10-CM | POA: Diagnosis not present

## 2018-03-09 DIAGNOSIS — I9589 Other hypotension: Secondary | ICD-10-CM | POA: Diagnosis not present

## 2018-03-09 DIAGNOSIS — G40909 Epilepsy, unspecified, not intractable, without status epilepticus: Secondary | ICD-10-CM | POA: Diagnosis not present

## 2018-03-09 DIAGNOSIS — S22000S Wedge compression fracture of unspecified thoracic vertebra, sequela: Secondary | ICD-10-CM | POA: Diagnosis not present

## 2018-03-09 DIAGNOSIS — S32000S Wedge compression fracture of unspecified lumbar vertebra, sequela: Secondary | ICD-10-CM | POA: Diagnosis not present

## 2018-03-09 DIAGNOSIS — R1310 Dysphagia, unspecified: Secondary | ICD-10-CM | POA: Diagnosis not present

## 2018-03-09 DIAGNOSIS — I2510 Atherosclerotic heart disease of native coronary artery without angina pectoris: Secondary | ICD-10-CM | POA: Diagnosis not present

## 2018-03-11 DIAGNOSIS — S32000S Wedge compression fracture of unspecified lumbar vertebra, sequela: Secondary | ICD-10-CM | POA: Diagnosis not present

## 2018-03-11 DIAGNOSIS — S22000S Wedge compression fracture of unspecified thoracic vertebra, sequela: Secondary | ICD-10-CM | POA: Diagnosis not present

## 2018-03-11 DIAGNOSIS — R1310 Dysphagia, unspecified: Secondary | ICD-10-CM | POA: Diagnosis not present

## 2018-03-11 DIAGNOSIS — S069X9S Unspecified intracranial injury with loss of consciousness of unspecified duration, sequela: Secondary | ICD-10-CM | POA: Diagnosis not present

## 2018-03-11 DIAGNOSIS — I9589 Other hypotension: Secondary | ICD-10-CM | POA: Diagnosis not present

## 2018-03-11 DIAGNOSIS — G40909 Epilepsy, unspecified, not intractable, without status epilepticus: Secondary | ICD-10-CM | POA: Diagnosis not present

## 2018-03-11 DIAGNOSIS — I2510 Atherosclerotic heart disease of native coronary artery without angina pectoris: Secondary | ICD-10-CM | POA: Diagnosis not present

## 2018-03-14 DIAGNOSIS — S32000S Wedge compression fracture of unspecified lumbar vertebra, sequela: Secondary | ICD-10-CM | POA: Diagnosis not present

## 2018-03-14 DIAGNOSIS — R1310 Dysphagia, unspecified: Secondary | ICD-10-CM | POA: Diagnosis not present

## 2018-03-14 DIAGNOSIS — S069X9S Unspecified intracranial injury with loss of consciousness of unspecified duration, sequela: Secondary | ICD-10-CM | POA: Diagnosis not present

## 2018-03-14 DIAGNOSIS — S22000S Wedge compression fracture of unspecified thoracic vertebra, sequela: Secondary | ICD-10-CM | POA: Diagnosis not present

## 2018-03-14 DIAGNOSIS — I2510 Atherosclerotic heart disease of native coronary artery without angina pectoris: Secondary | ICD-10-CM | POA: Diagnosis not present

## 2018-03-14 DIAGNOSIS — I9589 Other hypotension: Secondary | ICD-10-CM | POA: Diagnosis not present

## 2018-03-14 DIAGNOSIS — G40909 Epilepsy, unspecified, not intractable, without status epilepticus: Secondary | ICD-10-CM | POA: Diagnosis not present

## 2018-03-16 DIAGNOSIS — R1310 Dysphagia, unspecified: Secondary | ICD-10-CM | POA: Diagnosis not present

## 2018-03-16 DIAGNOSIS — S32000S Wedge compression fracture of unspecified lumbar vertebra, sequela: Secondary | ICD-10-CM | POA: Diagnosis not present

## 2018-03-16 DIAGNOSIS — G40909 Epilepsy, unspecified, not intractable, without status epilepticus: Secondary | ICD-10-CM | POA: Diagnosis not present

## 2018-03-16 DIAGNOSIS — S069X9S Unspecified intracranial injury with loss of consciousness of unspecified duration, sequela: Secondary | ICD-10-CM | POA: Diagnosis not present

## 2018-03-16 DIAGNOSIS — S22000S Wedge compression fracture of unspecified thoracic vertebra, sequela: Secondary | ICD-10-CM | POA: Diagnosis not present

## 2018-03-16 DIAGNOSIS — I9589 Other hypotension: Secondary | ICD-10-CM | POA: Diagnosis not present

## 2018-03-16 DIAGNOSIS — I2510 Atherosclerotic heart disease of native coronary artery without angina pectoris: Secondary | ICD-10-CM | POA: Diagnosis not present

## 2018-03-17 DIAGNOSIS — I2510 Atherosclerotic heart disease of native coronary artery without angina pectoris: Secondary | ICD-10-CM | POA: Diagnosis not present

## 2018-03-17 DIAGNOSIS — S32000S Wedge compression fracture of unspecified lumbar vertebra, sequela: Secondary | ICD-10-CM | POA: Diagnosis not present

## 2018-03-17 DIAGNOSIS — I9589 Other hypotension: Secondary | ICD-10-CM | POA: Diagnosis not present

## 2018-03-17 DIAGNOSIS — R1310 Dysphagia, unspecified: Secondary | ICD-10-CM | POA: Diagnosis not present

## 2018-03-17 DIAGNOSIS — S22000S Wedge compression fracture of unspecified thoracic vertebra, sequela: Secondary | ICD-10-CM | POA: Diagnosis not present

## 2018-03-17 DIAGNOSIS — S069X9S Unspecified intracranial injury with loss of consciousness of unspecified duration, sequela: Secondary | ICD-10-CM | POA: Diagnosis not present

## 2018-03-17 DIAGNOSIS — G40909 Epilepsy, unspecified, not intractable, without status epilepticus: Secondary | ICD-10-CM | POA: Diagnosis not present

## 2018-03-27 ENCOUNTER — Encounter: Payer: Self-pay | Admitting: Internal Medicine

## 2018-03-27 DIAGNOSIS — M545 Low back pain: Secondary | ICD-10-CM | POA: Diagnosis not present

## 2018-03-27 DIAGNOSIS — E870 Hyperosmolality and hypernatremia: Secondary | ICD-10-CM | POA: Diagnosis not present

## 2018-03-27 DIAGNOSIS — M5126 Other intervertebral disc displacement, lumbar region: Secondary | ICD-10-CM | POA: Diagnosis not present

## 2018-03-27 DIAGNOSIS — E860 Dehydration: Secondary | ICD-10-CM | POA: Diagnosis not present

## 2018-03-27 DIAGNOSIS — R6889 Other general symptoms and signs: Secondary | ICD-10-CM | POA: Diagnosis not present

## 2018-03-27 DIAGNOSIS — Z87828 Personal history of other (healed) physical injury and trauma: Secondary | ICD-10-CM | POA: Diagnosis not present

## 2018-03-27 DIAGNOSIS — R2689 Other abnormalities of gait and mobility: Secondary | ICD-10-CM | POA: Diagnosis not present

## 2018-03-27 DIAGNOSIS — H5461 Unqualified visual loss, right eye, normal vision left eye: Secondary | ICD-10-CM | POA: Diagnosis not present

## 2018-03-27 DIAGNOSIS — N390 Urinary tract infection, site not specified: Secondary | ICD-10-CM | POA: Diagnosis not present

## 2018-03-27 DIAGNOSIS — M5031 Other cervical disc degeneration,  high cervical region: Secondary | ICD-10-CM | POA: Diagnosis not present

## 2018-03-27 DIAGNOSIS — E7800 Pure hypercholesterolemia, unspecified: Secondary | ICD-10-CM | POA: Diagnosis not present

## 2018-03-27 DIAGNOSIS — M50223 Other cervical disc displacement at C6-C7 level: Secondary | ICD-10-CM | POA: Diagnosis not present

## 2018-03-27 DIAGNOSIS — G8220 Paraplegia, unspecified: Secondary | ICD-10-CM | POA: Diagnosis not present

## 2018-03-27 DIAGNOSIS — J189 Pneumonia, unspecified organism: Secondary | ICD-10-CM | POA: Diagnosis not present

## 2018-03-27 DIAGNOSIS — S299XXA Unspecified injury of thorax, initial encounter: Secondary | ICD-10-CM | POA: Diagnosis not present

## 2018-03-27 DIAGNOSIS — J449 Chronic obstructive pulmonary disease, unspecified: Secondary | ICD-10-CM | POA: Diagnosis not present

## 2018-03-27 DIAGNOSIS — M542 Cervicalgia: Secondary | ICD-10-CM | POA: Diagnosis not present

## 2018-03-27 DIAGNOSIS — S069X0A Unspecified intracranial injury without loss of consciousness, initial encounter: Secondary | ICD-10-CM | POA: Diagnosis not present

## 2018-03-27 DIAGNOSIS — M47812 Spondylosis without myelopathy or radiculopathy, cervical region: Secondary | ICD-10-CM | POA: Diagnosis not present

## 2018-03-27 DIAGNOSIS — Z7409 Other reduced mobility: Secondary | ICD-10-CM | POA: Diagnosis not present

## 2018-03-27 DIAGNOSIS — M8008XA Age-related osteoporosis with current pathological fracture, vertebra(e), initial encounter for fracture: Secondary | ICD-10-CM | POA: Diagnosis not present

## 2018-03-27 DIAGNOSIS — E871 Hypo-osmolality and hyponatremia: Secondary | ICD-10-CM | POA: Diagnosis not present

## 2018-03-27 DIAGNOSIS — R52 Pain, unspecified: Secondary | ICD-10-CM | POA: Diagnosis not present

## 2018-03-27 DIAGNOSIS — M48061 Spinal stenosis, lumbar region without neurogenic claudication: Secondary | ICD-10-CM | POA: Diagnosis not present

## 2018-03-27 DIAGNOSIS — S3992XA Unspecified injury of lower back, initial encounter: Secondary | ICD-10-CM | POA: Diagnosis not present

## 2018-03-27 DIAGNOSIS — M546 Pain in thoracic spine: Secondary | ICD-10-CM | POA: Diagnosis not present

## 2018-03-27 DIAGNOSIS — M5021 Other cervical disc displacement,  high cervical region: Secondary | ICD-10-CM | POA: Diagnosis not present

## 2018-03-27 DIAGNOSIS — G9389 Other specified disorders of brain: Secondary | ICD-10-CM | POA: Diagnosis not present

## 2018-03-28 DIAGNOSIS — J449 Chronic obstructive pulmonary disease, unspecified: Secondary | ICD-10-CM | POA: Diagnosis not present

## 2018-03-28 DIAGNOSIS — M542 Cervicalgia: Secondary | ICD-10-CM | POA: Diagnosis not present

## 2018-03-28 DIAGNOSIS — S069X0A Unspecified intracranial injury without loss of consciousness, initial encounter: Secondary | ICD-10-CM | POA: Diagnosis not present

## 2018-03-28 DIAGNOSIS — N390 Urinary tract infection, site not specified: Secondary | ICD-10-CM | POA: Diagnosis not present

## 2018-03-28 DIAGNOSIS — Z87828 Personal history of other (healed) physical injury and trauma: Secondary | ICD-10-CM | POA: Diagnosis not present

## 2018-03-28 DIAGNOSIS — J189 Pneumonia, unspecified organism: Secondary | ICD-10-CM | POA: Diagnosis not present

## 2018-03-28 DIAGNOSIS — R2689 Other abnormalities of gait and mobility: Secondary | ICD-10-CM | POA: Diagnosis not present

## 2018-03-28 DIAGNOSIS — M546 Pain in thoracic spine: Secondary | ICD-10-CM | POA: Diagnosis not present

## 2018-03-28 DIAGNOSIS — M545 Low back pain: Secondary | ICD-10-CM | POA: Diagnosis not present

## 2018-03-29 DIAGNOSIS — J189 Pneumonia, unspecified organism: Secondary | ICD-10-CM | POA: Diagnosis not present

## 2018-03-29 DIAGNOSIS — N390 Urinary tract infection, site not specified: Secondary | ICD-10-CM | POA: Diagnosis not present

## 2018-03-29 DIAGNOSIS — J449 Chronic obstructive pulmonary disease, unspecified: Secondary | ICD-10-CM | POA: Diagnosis not present

## 2018-03-29 DIAGNOSIS — S069X0A Unspecified intracranial injury without loss of consciousness, initial encounter: Secondary | ICD-10-CM | POA: Diagnosis not present

## 2018-03-30 DIAGNOSIS — J449 Chronic obstructive pulmonary disease, unspecified: Secondary | ICD-10-CM | POA: Diagnosis not present

## 2018-03-30 DIAGNOSIS — S069X0A Unspecified intracranial injury without loss of consciousness, initial encounter: Secondary | ICD-10-CM | POA: Diagnosis not present

## 2018-03-30 DIAGNOSIS — J189 Pneumonia, unspecified organism: Secondary | ICD-10-CM | POA: Diagnosis not present

## 2018-03-30 DIAGNOSIS — N390 Urinary tract infection, site not specified: Secondary | ICD-10-CM | POA: Diagnosis not present

## 2018-03-31 DIAGNOSIS — N390 Urinary tract infection, site not specified: Secondary | ICD-10-CM | POA: Diagnosis not present

## 2018-03-31 DIAGNOSIS — S069X0A Unspecified intracranial injury without loss of consciousness, initial encounter: Secondary | ICD-10-CM | POA: Diagnosis not present

## 2018-03-31 DIAGNOSIS — J189 Pneumonia, unspecified organism: Secondary | ICD-10-CM | POA: Diagnosis not present

## 2018-03-31 DIAGNOSIS — J449 Chronic obstructive pulmonary disease, unspecified: Secondary | ICD-10-CM | POA: Diagnosis not present

## 2018-04-01 DIAGNOSIS — J449 Chronic obstructive pulmonary disease, unspecified: Secondary | ICD-10-CM | POA: Diagnosis not present

## 2018-04-01 DIAGNOSIS — N390 Urinary tract infection, site not specified: Secondary | ICD-10-CM | POA: Diagnosis not present

## 2018-04-01 DIAGNOSIS — S069X0A Unspecified intracranial injury without loss of consciousness, initial encounter: Secondary | ICD-10-CM | POA: Diagnosis not present

## 2018-04-01 DIAGNOSIS — J189 Pneumonia, unspecified organism: Secondary | ICD-10-CM | POA: Diagnosis not present

## 2018-04-02 DIAGNOSIS — J449 Chronic obstructive pulmonary disease, unspecified: Secondary | ICD-10-CM | POA: Diagnosis not present

## 2018-04-02 DIAGNOSIS — J189 Pneumonia, unspecified organism: Secondary | ICD-10-CM | POA: Diagnosis not present

## 2018-04-02 DIAGNOSIS — S069X0A Unspecified intracranial injury without loss of consciousness, initial encounter: Secondary | ICD-10-CM | POA: Diagnosis not present

## 2018-04-02 DIAGNOSIS — N390 Urinary tract infection, site not specified: Secondary | ICD-10-CM | POA: Diagnosis not present

## 2018-04-03 DIAGNOSIS — J189 Pneumonia, unspecified organism: Secondary | ICD-10-CM | POA: Diagnosis not present

## 2018-04-03 DIAGNOSIS — M48061 Spinal stenosis, lumbar region without neurogenic claudication: Secondary | ICD-10-CM | POA: Diagnosis not present

## 2018-04-03 DIAGNOSIS — N390 Urinary tract infection, site not specified: Secondary | ICD-10-CM | POA: Diagnosis not present

## 2018-04-03 DIAGNOSIS — J449 Chronic obstructive pulmonary disease, unspecified: Secondary | ICD-10-CM | POA: Diagnosis not present

## 2018-04-04 DIAGNOSIS — J189 Pneumonia, unspecified organism: Secondary | ICD-10-CM | POA: Diagnosis not present

## 2018-04-04 DIAGNOSIS — J449 Chronic obstructive pulmonary disease, unspecified: Secondary | ICD-10-CM | POA: Diagnosis not present

## 2018-04-04 DIAGNOSIS — M48061 Spinal stenosis, lumbar region without neurogenic claudication: Secondary | ICD-10-CM | POA: Diagnosis not present

## 2018-04-04 DIAGNOSIS — N390 Urinary tract infection, site not specified: Secondary | ICD-10-CM | POA: Diagnosis not present

## 2018-04-08 DIAGNOSIS — S069X9S Unspecified intracranial injury with loss of consciousness of unspecified duration, sequela: Secondary | ICD-10-CM | POA: Diagnosis not present

## 2018-04-08 DIAGNOSIS — Z758 Other problems related to medical facilities and other health care: Secondary | ICD-10-CM | POA: Diagnosis not present

## 2018-04-08 DIAGNOSIS — G834 Cauda equina syndrome: Secondary | ICD-10-CM | POA: Diagnosis not present

## 2018-04-08 DIAGNOSIS — N390 Urinary tract infection, site not specified: Secondary | ICD-10-CM | POA: Diagnosis not present

## 2018-04-08 DIAGNOSIS — Z7689 Persons encountering health services in other specified circumstances: Secondary | ICD-10-CM | POA: Diagnosis not present

## 2018-04-09 DIAGNOSIS — Z993 Dependence on wheelchair: Secondary | ICD-10-CM | POA: Diagnosis not present

## 2018-04-09 DIAGNOSIS — I9589 Other hypotension: Secondary | ICD-10-CM | POA: Diagnosis not present

## 2018-04-09 DIAGNOSIS — S32000S Wedge compression fracture of unspecified lumbar vertebra, sequela: Secondary | ICD-10-CM | POA: Diagnosis not present

## 2018-04-09 DIAGNOSIS — S069X9S Unspecified intracranial injury with loss of consciousness of unspecified duration, sequela: Secondary | ICD-10-CM | POA: Diagnosis not present

## 2018-04-09 DIAGNOSIS — G40909 Epilepsy, unspecified, not intractable, without status epilepticus: Secondary | ICD-10-CM | POA: Diagnosis not present

## 2018-04-09 DIAGNOSIS — I2510 Atherosclerotic heart disease of native coronary artery without angina pectoris: Secondary | ICD-10-CM | POA: Diagnosis not present

## 2018-04-09 DIAGNOSIS — Z87891 Personal history of nicotine dependence: Secondary | ICD-10-CM | POA: Diagnosis not present

## 2018-04-09 DIAGNOSIS — Z9181 History of falling: Secondary | ICD-10-CM | POA: Diagnosis not present

## 2018-04-09 DIAGNOSIS — S22000S Wedge compression fracture of unspecified thoracic vertebra, sequela: Secondary | ICD-10-CM | POA: Diagnosis not present

## 2018-04-09 DIAGNOSIS — H9193 Unspecified hearing loss, bilateral: Secondary | ICD-10-CM | POA: Diagnosis not present

## 2018-04-10 ENCOUNTER — Other Ambulatory Visit: Payer: Self-pay | Admitting: Neurology

## 2018-04-11 DIAGNOSIS — S069X9S Unspecified intracranial injury with loss of consciousness of unspecified duration, sequela: Secondary | ICD-10-CM | POA: Diagnosis not present

## 2018-04-11 DIAGNOSIS — S32000S Wedge compression fracture of unspecified lumbar vertebra, sequela: Secondary | ICD-10-CM | POA: Diagnosis not present

## 2018-04-11 DIAGNOSIS — Z9181 History of falling: Secondary | ICD-10-CM | POA: Diagnosis not present

## 2018-04-11 DIAGNOSIS — S22000S Wedge compression fracture of unspecified thoracic vertebra, sequela: Secondary | ICD-10-CM | POA: Diagnosis not present

## 2018-04-11 DIAGNOSIS — G40909 Epilepsy, unspecified, not intractable, without status epilepticus: Secondary | ICD-10-CM | POA: Diagnosis not present

## 2018-04-11 DIAGNOSIS — Z87891 Personal history of nicotine dependence: Secondary | ICD-10-CM | POA: Diagnosis not present

## 2018-04-11 DIAGNOSIS — H9193 Unspecified hearing loss, bilateral: Secondary | ICD-10-CM | POA: Diagnosis not present

## 2018-04-11 DIAGNOSIS — I2510 Atherosclerotic heart disease of native coronary artery without angina pectoris: Secondary | ICD-10-CM | POA: Diagnosis not present

## 2018-04-11 DIAGNOSIS — Z993 Dependence on wheelchair: Secondary | ICD-10-CM | POA: Diagnosis not present

## 2018-04-11 DIAGNOSIS — I9589 Other hypotension: Secondary | ICD-10-CM | POA: Diagnosis not present

## 2018-04-12 DIAGNOSIS — I2510 Atherosclerotic heart disease of native coronary artery without angina pectoris: Secondary | ICD-10-CM | POA: Diagnosis not present

## 2018-04-12 DIAGNOSIS — Z87891 Personal history of nicotine dependence: Secondary | ICD-10-CM | POA: Diagnosis not present

## 2018-04-12 DIAGNOSIS — S32000S Wedge compression fracture of unspecified lumbar vertebra, sequela: Secondary | ICD-10-CM | POA: Diagnosis not present

## 2018-04-12 DIAGNOSIS — G40909 Epilepsy, unspecified, not intractable, without status epilepticus: Secondary | ICD-10-CM | POA: Diagnosis not present

## 2018-04-12 DIAGNOSIS — S22000S Wedge compression fracture of unspecified thoracic vertebra, sequela: Secondary | ICD-10-CM | POA: Diagnosis not present

## 2018-04-12 DIAGNOSIS — Z993 Dependence on wheelchair: Secondary | ICD-10-CM | POA: Diagnosis not present

## 2018-04-12 DIAGNOSIS — H9193 Unspecified hearing loss, bilateral: Secondary | ICD-10-CM | POA: Diagnosis not present

## 2018-04-12 DIAGNOSIS — I9589 Other hypotension: Secondary | ICD-10-CM | POA: Diagnosis not present

## 2018-04-12 DIAGNOSIS — S069X9S Unspecified intracranial injury with loss of consciousness of unspecified duration, sequela: Secondary | ICD-10-CM | POA: Diagnosis not present

## 2018-04-12 DIAGNOSIS — Z9181 History of falling: Secondary | ICD-10-CM | POA: Diagnosis not present

## 2018-04-13 DIAGNOSIS — I9589 Other hypotension: Secondary | ICD-10-CM | POA: Diagnosis not present

## 2018-04-13 DIAGNOSIS — Z9181 History of falling: Secondary | ICD-10-CM | POA: Diagnosis not present

## 2018-04-13 DIAGNOSIS — I2510 Atherosclerotic heart disease of native coronary artery without angina pectoris: Secondary | ICD-10-CM | POA: Diagnosis not present

## 2018-04-13 DIAGNOSIS — S069X9S Unspecified intracranial injury with loss of consciousness of unspecified duration, sequela: Secondary | ICD-10-CM | POA: Diagnosis not present

## 2018-04-13 DIAGNOSIS — Z87891 Personal history of nicotine dependence: Secondary | ICD-10-CM | POA: Diagnosis not present

## 2018-04-13 DIAGNOSIS — S32000S Wedge compression fracture of unspecified lumbar vertebra, sequela: Secondary | ICD-10-CM | POA: Diagnosis not present

## 2018-04-13 DIAGNOSIS — H9193 Unspecified hearing loss, bilateral: Secondary | ICD-10-CM | POA: Diagnosis not present

## 2018-04-13 DIAGNOSIS — Z993 Dependence on wheelchair: Secondary | ICD-10-CM | POA: Diagnosis not present

## 2018-04-13 DIAGNOSIS — S22000S Wedge compression fracture of unspecified thoracic vertebra, sequela: Secondary | ICD-10-CM | POA: Diagnosis not present

## 2018-04-13 DIAGNOSIS — G40909 Epilepsy, unspecified, not intractable, without status epilepticus: Secondary | ICD-10-CM | POA: Diagnosis not present

## 2018-04-15 DIAGNOSIS — Z87891 Personal history of nicotine dependence: Secondary | ICD-10-CM | POA: Diagnosis not present

## 2018-04-15 DIAGNOSIS — S32000S Wedge compression fracture of unspecified lumbar vertebra, sequela: Secondary | ICD-10-CM | POA: Diagnosis not present

## 2018-04-15 DIAGNOSIS — I2510 Atherosclerotic heart disease of native coronary artery without angina pectoris: Secondary | ICD-10-CM | POA: Diagnosis not present

## 2018-04-15 DIAGNOSIS — G40909 Epilepsy, unspecified, not intractable, without status epilepticus: Secondary | ICD-10-CM | POA: Diagnosis not present

## 2018-04-15 DIAGNOSIS — Z993 Dependence on wheelchair: Secondary | ICD-10-CM | POA: Diagnosis not present

## 2018-04-15 DIAGNOSIS — H9193 Unspecified hearing loss, bilateral: Secondary | ICD-10-CM | POA: Diagnosis not present

## 2018-04-15 DIAGNOSIS — S22000S Wedge compression fracture of unspecified thoracic vertebra, sequela: Secondary | ICD-10-CM | POA: Diagnosis not present

## 2018-04-15 DIAGNOSIS — Z9181 History of falling: Secondary | ICD-10-CM | POA: Diagnosis not present

## 2018-04-15 DIAGNOSIS — S069X9S Unspecified intracranial injury with loss of consciousness of unspecified duration, sequela: Secondary | ICD-10-CM | POA: Diagnosis not present

## 2018-04-15 DIAGNOSIS — I9589 Other hypotension: Secondary | ICD-10-CM | POA: Diagnosis not present

## 2018-04-18 DIAGNOSIS — I2510 Atherosclerotic heart disease of native coronary artery without angina pectoris: Secondary | ICD-10-CM | POA: Diagnosis not present

## 2018-04-18 DIAGNOSIS — Z87891 Personal history of nicotine dependence: Secondary | ICD-10-CM | POA: Diagnosis not present

## 2018-04-18 DIAGNOSIS — S22000S Wedge compression fracture of unspecified thoracic vertebra, sequela: Secondary | ICD-10-CM | POA: Diagnosis not present

## 2018-04-18 DIAGNOSIS — H9193 Unspecified hearing loss, bilateral: Secondary | ICD-10-CM | POA: Diagnosis not present

## 2018-04-18 DIAGNOSIS — Z9181 History of falling: Secondary | ICD-10-CM | POA: Diagnosis not present

## 2018-04-18 DIAGNOSIS — Z993 Dependence on wheelchair: Secondary | ICD-10-CM | POA: Diagnosis not present

## 2018-04-18 DIAGNOSIS — G40909 Epilepsy, unspecified, not intractable, without status epilepticus: Secondary | ICD-10-CM | POA: Diagnosis not present

## 2018-04-18 DIAGNOSIS — S069X9S Unspecified intracranial injury with loss of consciousness of unspecified duration, sequela: Secondary | ICD-10-CM | POA: Diagnosis not present

## 2018-04-18 DIAGNOSIS — S32000S Wedge compression fracture of unspecified lumbar vertebra, sequela: Secondary | ICD-10-CM | POA: Diagnosis not present

## 2018-04-18 DIAGNOSIS — I9589 Other hypotension: Secondary | ICD-10-CM | POA: Diagnosis not present

## 2018-04-19 DIAGNOSIS — H9193 Unspecified hearing loss, bilateral: Secondary | ICD-10-CM | POA: Diagnosis not present

## 2018-04-19 DIAGNOSIS — S32000S Wedge compression fracture of unspecified lumbar vertebra, sequela: Secondary | ICD-10-CM | POA: Diagnosis not present

## 2018-04-19 DIAGNOSIS — G40909 Epilepsy, unspecified, not intractable, without status epilepticus: Secondary | ICD-10-CM | POA: Diagnosis not present

## 2018-04-19 DIAGNOSIS — Z993 Dependence on wheelchair: Secondary | ICD-10-CM | POA: Diagnosis not present

## 2018-04-19 DIAGNOSIS — S22000S Wedge compression fracture of unspecified thoracic vertebra, sequela: Secondary | ICD-10-CM | POA: Diagnosis not present

## 2018-04-19 DIAGNOSIS — I9589 Other hypotension: Secondary | ICD-10-CM | POA: Diagnosis not present

## 2018-04-19 DIAGNOSIS — Z87891 Personal history of nicotine dependence: Secondary | ICD-10-CM | POA: Diagnosis not present

## 2018-04-19 DIAGNOSIS — S069X9S Unspecified intracranial injury with loss of consciousness of unspecified duration, sequela: Secondary | ICD-10-CM | POA: Diagnosis not present

## 2018-04-19 DIAGNOSIS — Z9181 History of falling: Secondary | ICD-10-CM | POA: Diagnosis not present

## 2018-04-19 DIAGNOSIS — I2510 Atherosclerotic heart disease of native coronary artery without angina pectoris: Secondary | ICD-10-CM | POA: Diagnosis not present

## 2018-04-21 DIAGNOSIS — I2510 Atherosclerotic heart disease of native coronary artery without angina pectoris: Secondary | ICD-10-CM | POA: Diagnosis not present

## 2018-04-21 DIAGNOSIS — Z993 Dependence on wheelchair: Secondary | ICD-10-CM | POA: Diagnosis not present

## 2018-04-21 DIAGNOSIS — Z87891 Personal history of nicotine dependence: Secondary | ICD-10-CM | POA: Diagnosis not present

## 2018-04-21 DIAGNOSIS — I9589 Other hypotension: Secondary | ICD-10-CM | POA: Diagnosis not present

## 2018-04-21 DIAGNOSIS — S22000S Wedge compression fracture of unspecified thoracic vertebra, sequela: Secondary | ICD-10-CM | POA: Diagnosis not present

## 2018-04-21 DIAGNOSIS — S32000S Wedge compression fracture of unspecified lumbar vertebra, sequela: Secondary | ICD-10-CM | POA: Diagnosis not present

## 2018-04-21 DIAGNOSIS — S069X9S Unspecified intracranial injury with loss of consciousness of unspecified duration, sequela: Secondary | ICD-10-CM | POA: Diagnosis not present

## 2018-04-21 DIAGNOSIS — G40909 Epilepsy, unspecified, not intractable, without status epilepticus: Secondary | ICD-10-CM | POA: Diagnosis not present

## 2018-04-21 DIAGNOSIS — H9193 Unspecified hearing loss, bilateral: Secondary | ICD-10-CM | POA: Diagnosis not present

## 2018-04-21 DIAGNOSIS — Z9181 History of falling: Secondary | ICD-10-CM | POA: Diagnosis not present

## 2018-04-22 DIAGNOSIS — G40909 Epilepsy, unspecified, not intractable, without status epilepticus: Secondary | ICD-10-CM | POA: Diagnosis not present

## 2018-04-22 DIAGNOSIS — I2510 Atherosclerotic heart disease of native coronary artery without angina pectoris: Secondary | ICD-10-CM | POA: Diagnosis not present

## 2018-04-22 DIAGNOSIS — Z9181 History of falling: Secondary | ICD-10-CM | POA: Diagnosis not present

## 2018-04-22 DIAGNOSIS — I9589 Other hypotension: Secondary | ICD-10-CM | POA: Diagnosis not present

## 2018-04-22 DIAGNOSIS — S22000S Wedge compression fracture of unspecified thoracic vertebra, sequela: Secondary | ICD-10-CM | POA: Diagnosis not present

## 2018-04-22 DIAGNOSIS — H9193 Unspecified hearing loss, bilateral: Secondary | ICD-10-CM | POA: Diagnosis not present

## 2018-04-22 DIAGNOSIS — Z993 Dependence on wheelchair: Secondary | ICD-10-CM | POA: Diagnosis not present

## 2018-04-22 DIAGNOSIS — S32000S Wedge compression fracture of unspecified lumbar vertebra, sequela: Secondary | ICD-10-CM | POA: Diagnosis not present

## 2018-04-22 DIAGNOSIS — S069X9S Unspecified intracranial injury with loss of consciousness of unspecified duration, sequela: Secondary | ICD-10-CM | POA: Diagnosis not present

## 2018-04-22 DIAGNOSIS — Z87891 Personal history of nicotine dependence: Secondary | ICD-10-CM | POA: Diagnosis not present

## 2018-04-25 DIAGNOSIS — Z993 Dependence on wheelchair: Secondary | ICD-10-CM | POA: Diagnosis not present

## 2018-04-25 DIAGNOSIS — S32000S Wedge compression fracture of unspecified lumbar vertebra, sequela: Secondary | ICD-10-CM | POA: Diagnosis not present

## 2018-04-25 DIAGNOSIS — S069X9S Unspecified intracranial injury with loss of consciousness of unspecified duration, sequela: Secondary | ICD-10-CM | POA: Diagnosis not present

## 2018-04-25 DIAGNOSIS — S22000S Wedge compression fracture of unspecified thoracic vertebra, sequela: Secondary | ICD-10-CM | POA: Diagnosis not present

## 2018-04-25 DIAGNOSIS — G40909 Epilepsy, unspecified, not intractable, without status epilepticus: Secondary | ICD-10-CM | POA: Diagnosis not present

## 2018-04-25 DIAGNOSIS — I9589 Other hypotension: Secondary | ICD-10-CM | POA: Diagnosis not present

## 2018-04-25 DIAGNOSIS — Z9181 History of falling: Secondary | ICD-10-CM | POA: Diagnosis not present

## 2018-04-25 DIAGNOSIS — Z87891 Personal history of nicotine dependence: Secondary | ICD-10-CM | POA: Diagnosis not present

## 2018-04-25 DIAGNOSIS — H9193 Unspecified hearing loss, bilateral: Secondary | ICD-10-CM | POA: Diagnosis not present

## 2018-04-25 DIAGNOSIS — I2510 Atherosclerotic heart disease of native coronary artery without angina pectoris: Secondary | ICD-10-CM | POA: Diagnosis not present

## 2018-04-26 DIAGNOSIS — Z87891 Personal history of nicotine dependence: Secondary | ICD-10-CM | POA: Diagnosis not present

## 2018-04-26 DIAGNOSIS — S22000S Wedge compression fracture of unspecified thoracic vertebra, sequela: Secondary | ICD-10-CM | POA: Diagnosis not present

## 2018-04-26 DIAGNOSIS — I2510 Atherosclerotic heart disease of native coronary artery without angina pectoris: Secondary | ICD-10-CM | POA: Diagnosis not present

## 2018-04-26 DIAGNOSIS — H9193 Unspecified hearing loss, bilateral: Secondary | ICD-10-CM | POA: Diagnosis not present

## 2018-04-26 DIAGNOSIS — Z993 Dependence on wheelchair: Secondary | ICD-10-CM | POA: Diagnosis not present

## 2018-04-26 DIAGNOSIS — I9589 Other hypotension: Secondary | ICD-10-CM | POA: Diagnosis not present

## 2018-04-26 DIAGNOSIS — S069X9S Unspecified intracranial injury with loss of consciousness of unspecified duration, sequela: Secondary | ICD-10-CM | POA: Diagnosis not present

## 2018-04-26 DIAGNOSIS — G40909 Epilepsy, unspecified, not intractable, without status epilepticus: Secondary | ICD-10-CM | POA: Diagnosis not present

## 2018-04-26 DIAGNOSIS — S32000S Wedge compression fracture of unspecified lumbar vertebra, sequela: Secondary | ICD-10-CM | POA: Diagnosis not present

## 2018-04-26 DIAGNOSIS — Z9181 History of falling: Secondary | ICD-10-CM | POA: Diagnosis not present

## 2018-12-02 ENCOUNTER — Encounter: Payer: Self-pay | Admitting: Internal Medicine

## 2018-12-06 ENCOUNTER — Encounter: Payer: Self-pay | Admitting: Internal Medicine

## 2019-03-04 IMAGING — CR DG RIBS W/ CHEST 3+V*R*
5 series · 5 of 5 positions shown · non-contrast
Comparison: 12/09/2016

CLINICAL DATA: Fall with rib pain

EXAM:
RIGHT RIBS AND CHEST - 3+ VIEW

[t ribs ap lower right]
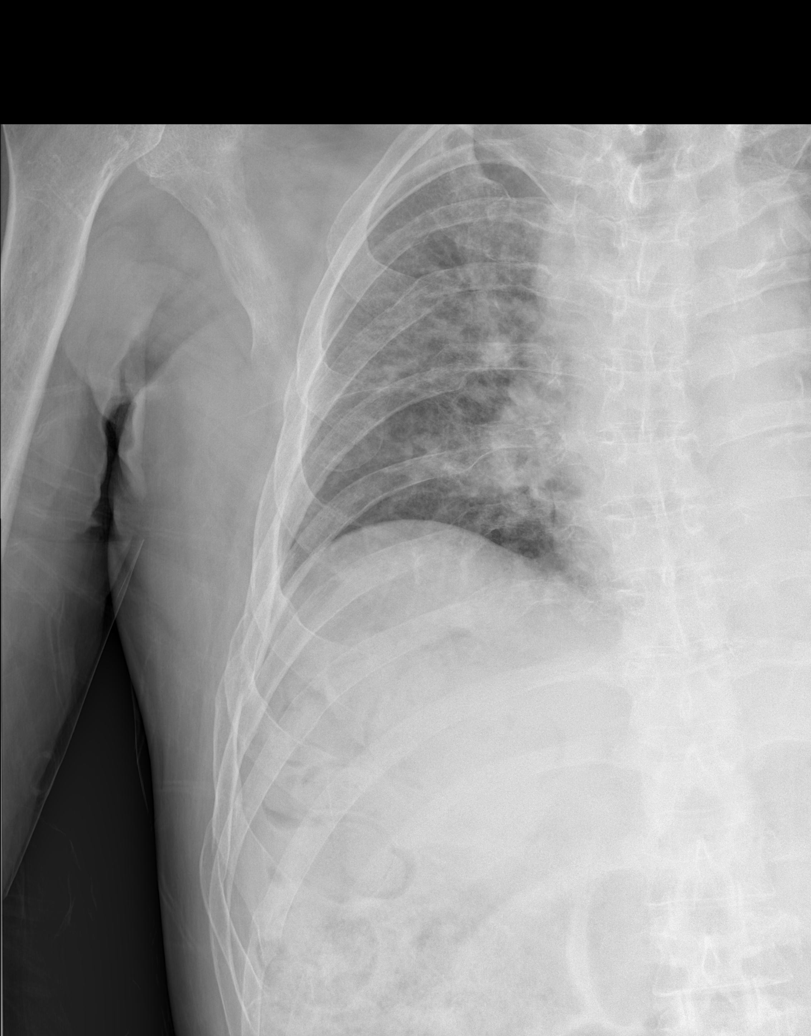

[t ribs ap upper right]
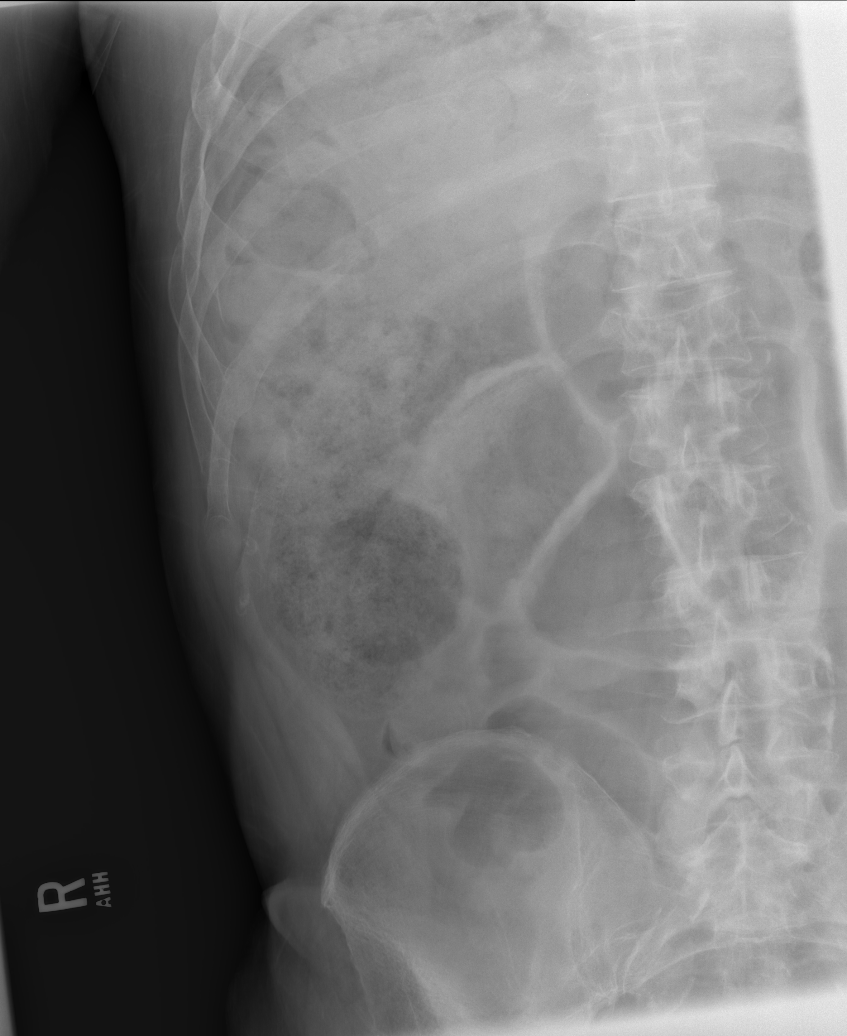

[t ribs rpo right (1 of 2)]
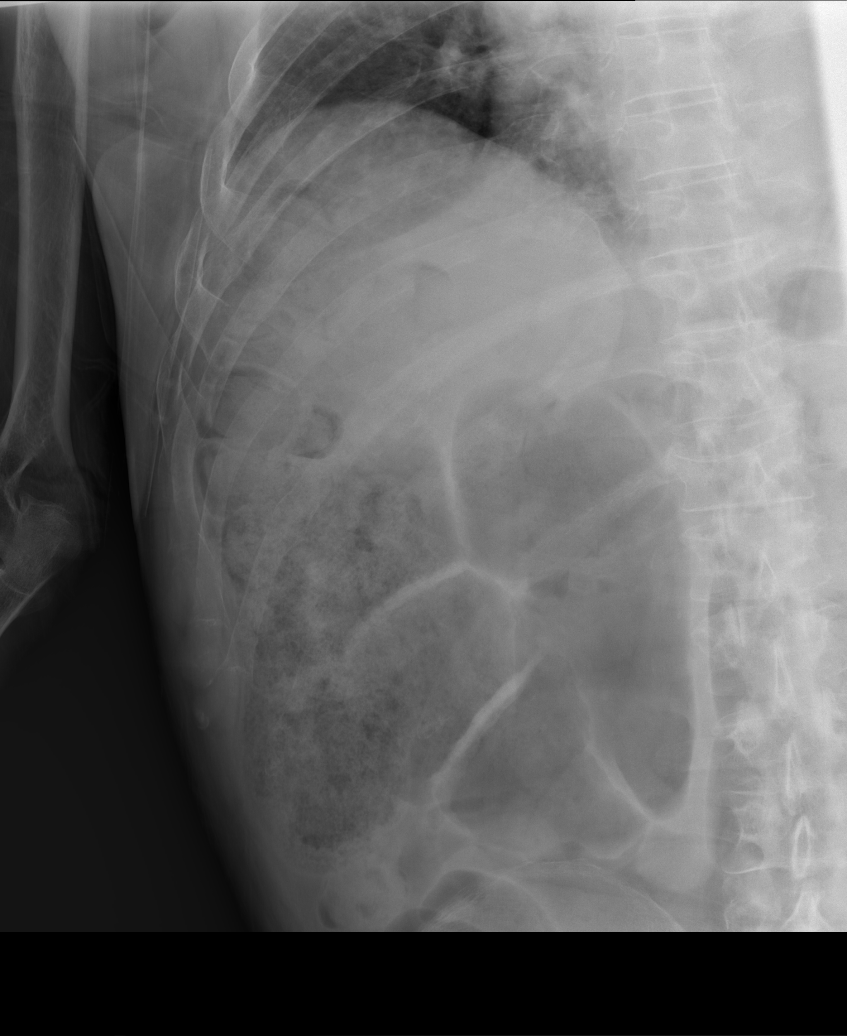

[t ribs rpo right (2 of 2)]
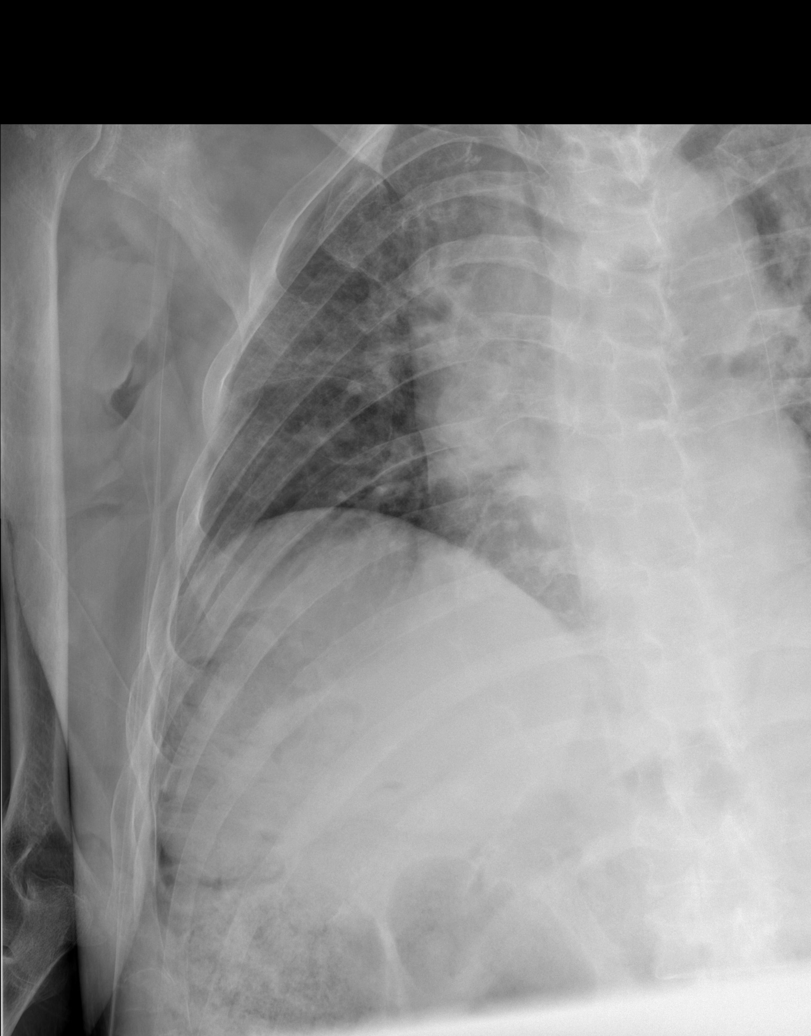

[t chest supine]
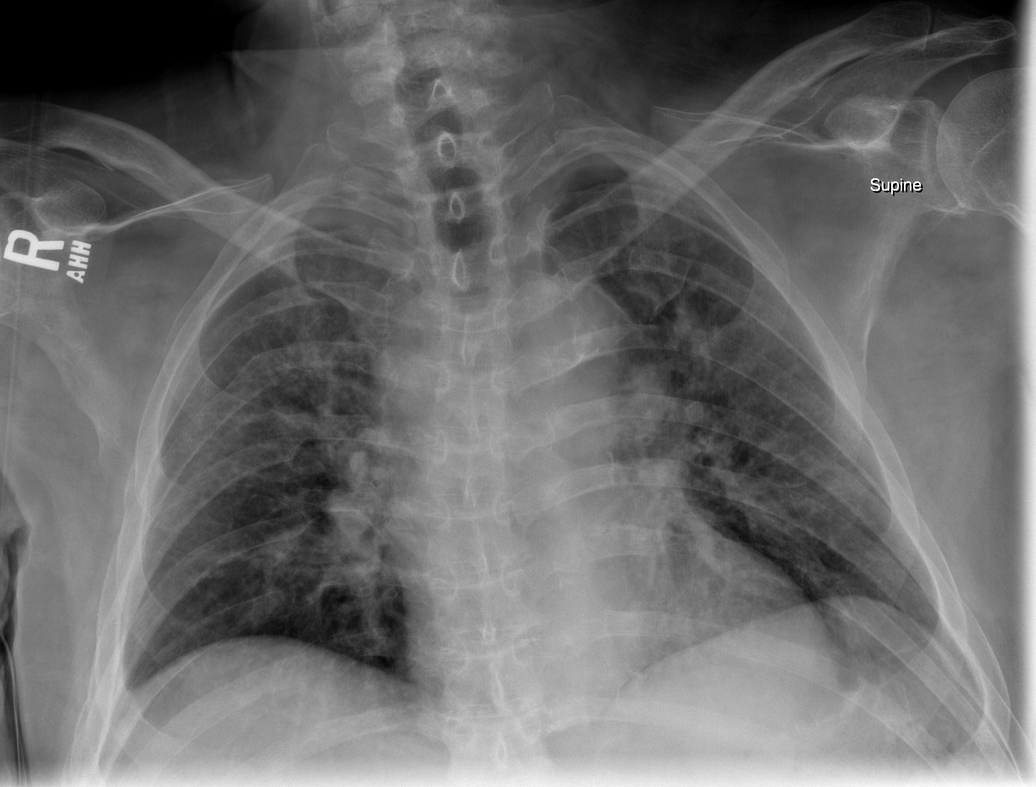

[5 of 5 positions shown; findings below may reference images not displayed]

FINDINGS: Single-view chest demonstrates mild right pleural thickening. Low
lung volumes. No pneumothorax. Borderline cardiomegaly.

Right rib series demonstrates old right fourth, fifth, sixth,
seventh, eighth, and ninth posterior rib fractures. Additional old
appearing right third, fourth, fifth, sixth, seventh, eighth
anterolateral rib fractures. Possible acute right ninth and tenth
lateral rib fractures.
IMPRESSION: 1. Mild right pleural thickening.  Negative for pneumothorax
2. There are multiple old right-sided rib fractures. Possible acute
right ninth and tenth lateral rib fractures.

## 2019-03-04 IMAGING — CR DG ANKLE COMPLETE 3+V*R*
3 series · 3 of 3 positions shown · non-contrast
Comparison: None.

CLINICAL DATA: Fell.  Right ankle pain.

EXAM:
RIGHT ANKLE - COMPLETE 3+ VIEW

[x ankle ap right]
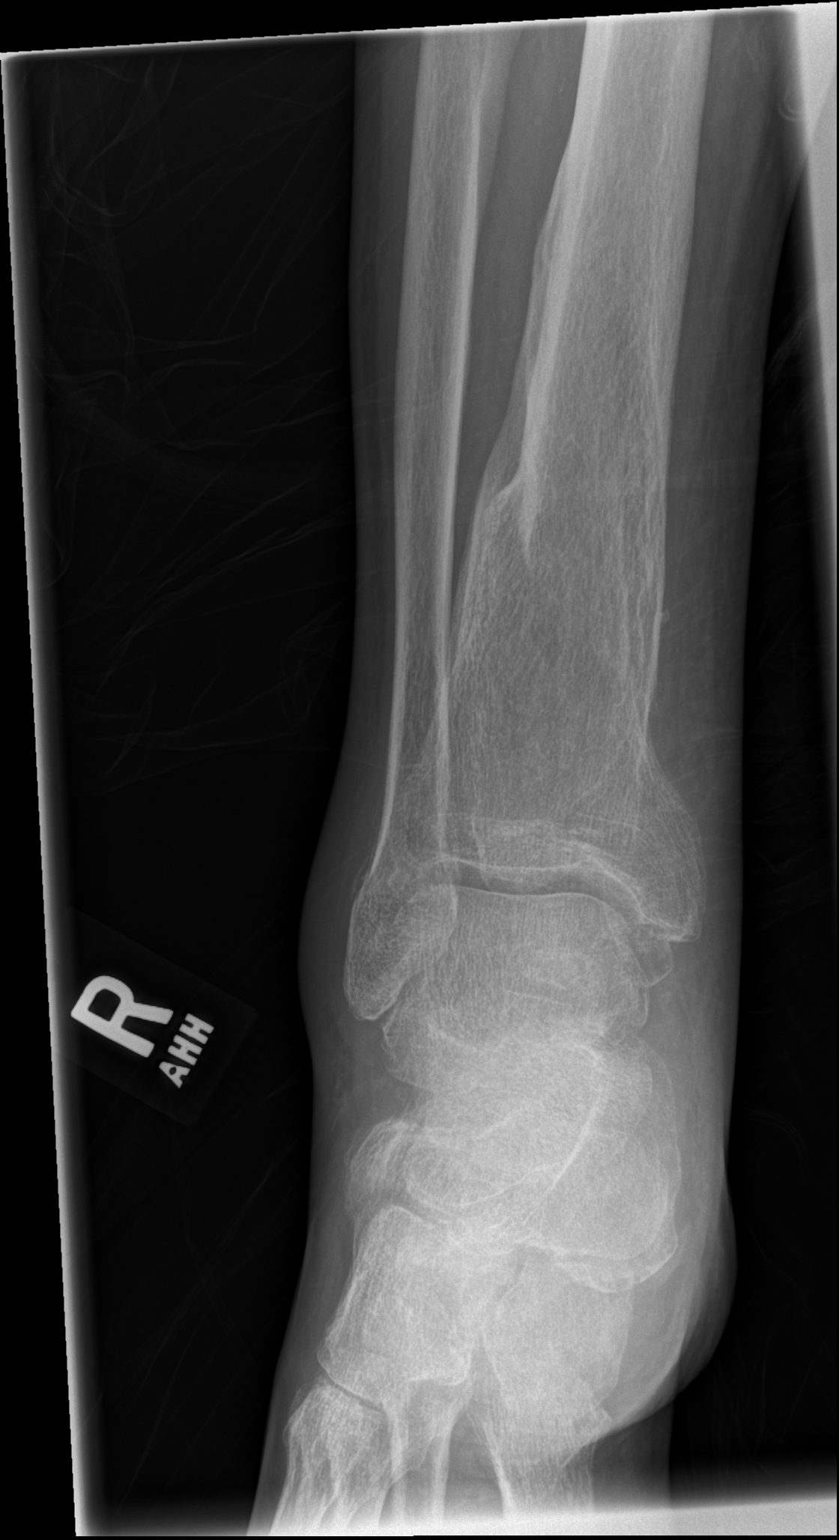

[x ankle obl right]
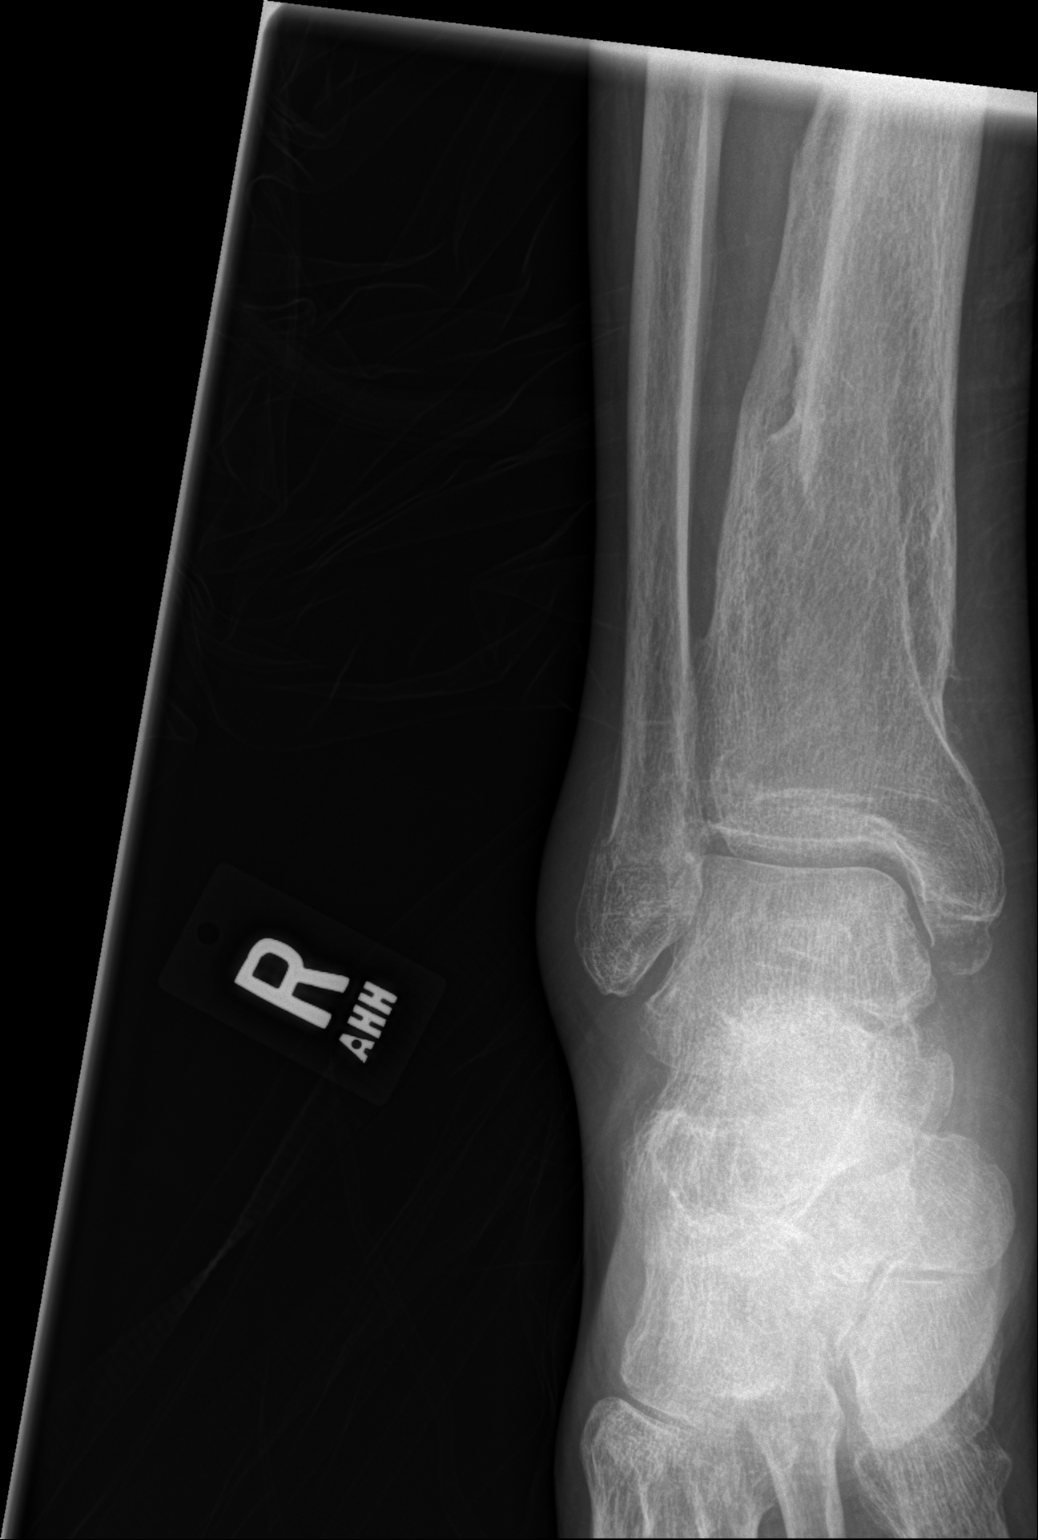

[x ankle lat right]
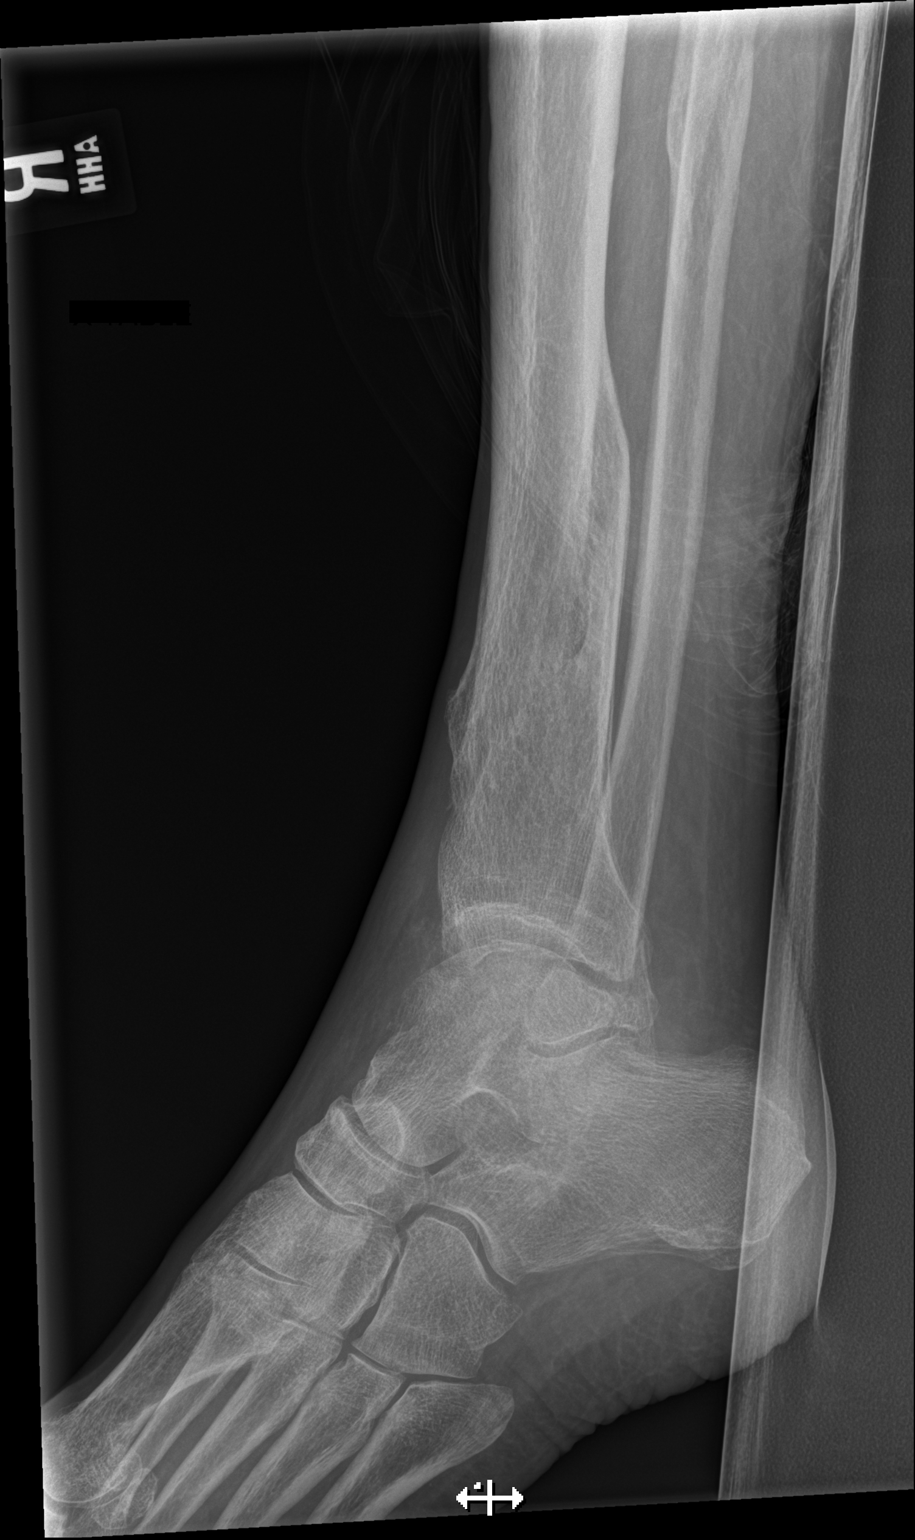

[3 of 3 positions shown; findings below may reference images not displayed]

FINDINGS: Evidence of remote trauma involving the distal tibia with healed
fractures. Remote healed proximal fibular fractures are also noted.
Suspect a nondisplaced acute distal fibular fracture. There is also
a distal avulsion-type fracture involving the medial malleolus.
IMPRESSION: Nondisplaced fractures involving the medial and lateral malleoli.

## 2020-10-29 ENCOUNTER — Encounter: Payer: Self-pay | Admitting: *Deleted
